# Patient Record
Sex: Female | Born: 1970 | Race: Asian | Hispanic: No | Smoking: Current some day smoker
Health system: Southern US, Community
[De-identification: ages and names within clinical notes are randomized; demographics above are authoritative.]

## PROBLEM LIST (undated history)

## (undated) DIAGNOSIS — K859 Acute pancreatitis without necrosis or infection, unspecified: Secondary | ICD-10-CM

## (undated) DIAGNOSIS — M1990 Unspecified osteoarthritis, unspecified site: Secondary | ICD-10-CM

## (undated) DIAGNOSIS — T4145XA Adverse effect of unspecified anesthetic, initial encounter: Secondary | ICD-10-CM

## (undated) DIAGNOSIS — Z8489 Family history of other specified conditions: Secondary | ICD-10-CM

## (undated) DIAGNOSIS — E119 Type 2 diabetes mellitus without complications: Secondary | ICD-10-CM

## (undated) DIAGNOSIS — IMO0002 Reserved for concepts with insufficient information to code with codable children: Secondary | ICD-10-CM

## (undated) DIAGNOSIS — T8859XA Other complications of anesthesia, initial encounter: Secondary | ICD-10-CM

## (undated) DIAGNOSIS — I10 Essential (primary) hypertension: Secondary | ICD-10-CM

## (undated) DIAGNOSIS — K635 Polyp of colon: Secondary | ICD-10-CM

## (undated) DIAGNOSIS — K219 Gastro-esophageal reflux disease without esophagitis: Secondary | ICD-10-CM

## (undated) DIAGNOSIS — Z96649 Presence of unspecified artificial hip joint: Secondary | ICD-10-CM

## (undated) HISTORY — PX: OTHER SURGICAL HISTORY: SHX169

## (undated) HISTORY — PX: JOINT REPLACEMENT: SHX530

## (undated) HISTORY — PX: TOTAL HIP ARTHROPLASTY: SHX124

---

## 2008-12-24 ENCOUNTER — Emergency Department (HOSPITAL_BASED_OUTPATIENT_CLINIC_OR_DEPARTMENT_OTHER): Admission: EM | Admit: 2008-12-24 | Discharge: 2008-12-24 | Payer: Self-pay | Admitting: Emergency Medicine

## 2011-03-11 ENCOUNTER — Emergency Department (INDEPENDENT_AMBULATORY_CARE_PROVIDER_SITE_OTHER): Payer: 59

## 2011-03-11 ENCOUNTER — Emergency Department (HOSPITAL_BASED_OUTPATIENT_CLINIC_OR_DEPARTMENT_OTHER)
Admission: EM | Admit: 2011-03-11 | Discharge: 2011-03-11 | Disposition: A | Discharge status: Home / Self Care | Payer: 59 | Attending: Emergency Medicine | Admitting: Emergency Medicine

## 2011-03-11 DIAGNOSIS — R509 Fever, unspecified: Secondary | ICD-10-CM

## 2011-03-11 DIAGNOSIS — R059 Cough, unspecified: Secondary | ICD-10-CM | POA: Insufficient documentation

## 2011-03-11 DIAGNOSIS — R51 Headache: Secondary | ICD-10-CM

## 2011-03-11 DIAGNOSIS — R05 Cough: Secondary | ICD-10-CM | POA: Insufficient documentation

## 2011-03-11 DIAGNOSIS — R112 Nausea with vomiting, unspecified: Secondary | ICD-10-CM

## 2011-03-11 DIAGNOSIS — H9209 Otalgia, unspecified ear: Secondary | ICD-10-CM | POA: Insufficient documentation

## 2011-03-11 LAB — DIFFERENTIAL
Basophils Absolute: 0 10*3/uL (ref 0.0–0.1)
Basophils Relative: 0 % (ref 0–1)
Eosinophils Relative: 0 % (ref 0–5)
Lymphocytes Relative: 9 % — ABNORMAL LOW (ref 12–46)
Monocytes Absolute: 0.5 10*3/uL (ref 0.1–1.0)
Neutro Abs: 9 10*3/uL — ABNORMAL HIGH (ref 1.7–7.7)

## 2011-03-11 LAB — COMPREHENSIVE METABOLIC PANEL
Albumin: 3.9 g/dL (ref 3.5–5.2)
BUN: 11 mg/dL (ref 6–23)
GFR calc Af Amer: >60 mL/min (ref >60)
GFR calc non Af Amer: >60 mL/min (ref >60)
Potassium: 4.3 mEq/L (ref 3.5–5.1)
Sodium: 131 mEq/L — ABNORMAL LOW (ref 135–145)
Total Bilirubin: 0.2 mg/dL — ABNORMAL LOW (ref 0.3–1.2)
Total Protein: 8.1 g/dL (ref 6.0–8.3)

## 2011-03-11 LAB — URINALYSIS, MICROSCOPIC ONLY
Glucose, UA: NEGATIVE mg/dL
pH: 6 (ref 5.0–8.0)

## 2011-03-11 LAB — CBC
MCHC: 33.8 g/dL (ref 30.0–36.0)
RDW: 14.9 % (ref 11.5–15.5)

## 2011-03-11 LAB — APTT: aPTT: 33 seconds (ref 24–37)

## 2011-03-11 MED ORDER — MORPHINE SULFATE 4 MG/ML IJ SOLN: 4.0000 mg | Freq: Once | INTRAMUSCULAR | Status: DC

## 2011-03-11 MED ORDER — FENTANYL CITRATE 0.05 MG/ML IJ SOLN
50.0000 ug | Freq: Once | INTRAMUSCULAR | Status: AC
  Administered 2011-03-11: 50 ug via INTRAVENOUS
  Filled 2011-03-11: qty 2

## 2011-03-11 MED ORDER — ONDANSETRON HCL 4 MG/2ML IJ SOLN
4.0000 mg | Freq: Once | INTRAMUSCULAR | Status: AC
  Administered 2011-03-11: 4 mg via INTRAVENOUS

## 2011-03-11 MED ORDER — OXYCODONE-ACETAMINOPHEN 5-325 MG PO TABS: 2.0000 | ORAL_TABLET | Freq: Once | ORAL | Status: DC

## 2011-03-11 MED ORDER — ONDANSETRON HCL 4 MG PO TABS: 4.0000 mg | ORAL_TABLET | Freq: Four times a day (QID) | ORAL | Status: AC

## 2011-03-11 MED ORDER — SULFAMETHOXAZOLE-TMP DS 800-160 MG PO TABS: 2.0000 | ORAL_TABLET | Freq: Once | ORAL | Status: DC

## 2011-03-11 MED ORDER — DOXYCYCLINE HYCLATE 100 MG PO CAPS: 100.0000 mg | ORAL_CAPSULE | Freq: Two times a day (BID) | ORAL | Status: AC

## 2011-03-11 MED ORDER — OXYCODONE-ACETAMINOPHEN 5-325 MG PO TABS: 2.0000 | ORAL_TABLET | ORAL | Status: AC | PRN

## 2011-03-11 MED ORDER — KETOROLAC TROMETHAMINE 30 MG/ML IJ SOLN
30.0000 mg | Freq: Once | INTRAMUSCULAR | Status: AC
  Administered 2011-03-11: 30 mg via INTRAVENOUS
  Filled 2011-03-11: qty 1

## 2011-03-11 MED ORDER — ACETAMINOPHEN 500 MG PO TABS
ORAL_TABLET | ORAL | Status: AC
  Administered 2011-03-11: 13:00:00
  Filled 2011-03-11: qty 1

## 2011-03-11 MED ORDER — MORPHINE SULFATE 4 MG/ML IJ SOLN
4.0000 mg | Freq: Once | INTRAMUSCULAR | Status: AC
  Administered 2011-03-11: 4 mg via INTRAVENOUS
  Filled 2011-03-11: qty 1

## 2011-03-11 MED ORDER — OXYCODONE HCL 5 MG PO TABS
10.0000 mg | ORAL_TABLET | Freq: Once | ORAL | Status: DC
  Filled 2011-03-11: qty 2

## 2011-03-11 MED FILL — Acetaminophen Tab 500 MG: ORAL | Qty: 1 | Status: AC

## 2011-03-12 LAB — URINE CULTURE

## 2011-03-12 MED FILL — Ondansetron HCl Inj 4 MG/2ML (2 MG/ML): INTRAMUSCULAR | Qty: 1 | Status: AC

## 2011-03-12 MED FILL — Doxycycline Hyclate For Inj 100 MG: INTRAVENOUS | Qty: 100 | Status: AC

## 2011-03-17 LAB — CULTURE, BLOOD (ROUTINE X 2)
Culture  Setup Time: 201209182149
Culture: NO GROWTH

## 2011-04-03 NOTE — ED Provider Notes (Signed)
History     CSN: [id number] Arrival date & time: 03/11/2011 11:34 AM  No chief complaint on file.   (Consider location/radiation/quality/duration/timing/severity/associated sxs/prior treatment) The history is provided by the patient and the spouse. No language interpreter was used.   Patient is here today complaining of fever, nausea, and a skin lesion on her scalp. Patient was seen by a PA at her primary care physician's office 3 days ago. At that time she was started on Bactrim for lesion on her scalp. Patient has never had cellulitis or an abscess in the past. Patient says she's continued to get sicker that she's been taking antibiotics. Patient on presentation is febrile to 102. She complains of headache as well. She does not have any neck stiffness. Patient does not have tenderness to palpation of the mastoid process. They affected area is located just superior posterior to the right ear. It is not appear to be any fluctuance or induration associated with this. Patient says her pain and discomfort as a 10 out of 10. When asked where this is located patient states pretty much everywhere. There are no other associated or modifying factors. No past medical history on file.  No past surgical history on file.  No family history on file.  History  Substance Use Topics  . Smoking status: Not on file  . Smokeless tobacco: Not on file  . Alcohol Use: Not on file    OB History    No data available      Review of Systems  Constitutional: Positive for fever and fatigue.  HENT:       Scalp lesion as mentioned previously  Eyes: Negative.   Respiratory: Negative.   Cardiovascular: Negative.   Gastrointestinal: Positive for nausea and vomiting.  Genitourinary: Negative.   Musculoskeletal: Negative.   Skin:       Area of erythema swelling and tenderness over the right parietal area  Neurological: Positive for headaches.  Hematological: Negative.   Psychiatric/Behavioral: Negative.     All other systems reviewed and are negative.    Allergies  Review of patient's allergies indicates no known allergies.  Home Medications   Current Outpatient Rx  Name Route Sig Dispense Refill  . IBUPROFEN 100 MG PO CHEW Oral Chew 100 mg by mouth every 8 (eight) hours as needed.      Marland Kitchen TRAMADOL HCL 50 MG PO TABS Oral Take 50 mg by mouth every 6 (six) hours as needed.        BP 136/80  Pulse 79  Temp(Src) 98.1 F (36.7 C) (Oral)  Resp 18  SpO2 100%  Physical Exam  Nursing note and vitals reviewed. Constitutional: She is oriented to person, place, and time. She appears well-developed and well-nourished. No distress.       Uncomfortable appearing  HENT:  Head: Normocephalic and atraumatic.  Right Ear: External ear normal.  Left Ear: External ear normal.       No tenderness to palpation of the mastoid area. Patient does have tenderness to palpation just superior and posterior to the right ear. There is an area that is approximately 2 inches square or patient has tenderness to palpation but no fluctuance or induration noted.  Eyes: Pupils are equal, round, and reactive to light. Right eye exhibits no discharge. Left eye exhibits no discharge.  Neck: Normal range of motion.  Cardiovascular: Normal rate, regular rhythm, normal heart sounds and intact distal pulses.  Exam reveals no gallop and no friction rub.   No murmur heard.  Pulmonary/Chest: Effort normal and breath sounds normal. No respiratory distress. She has no wheezes. She has no rales.  Abdominal: Soft. Bowel sounds are normal. She exhibits no distension. There is no tenderness. There is no rebound and no guarding.  Musculoskeletal: Normal range of motion. She exhibits no edema and no tenderness.  Neurological: She is alert and oriented to person, place, and time. No cranial nerve deficit. She exhibits normal muscle tone. Coordination normal.  Skin: Skin is warm and dry.  Psychiatric: She has a normal mood and affect.     ED Course  Procedures (including critical care time)  Labs Reviewed  URINALYSIS, MICROSCOPIC ONLY - Abnormal; Notable for the following:    Hgb urine dipstick MODERATE (*)    Ketones, ur 15 (*)    Leukocytes, UA TRACE (*)    Bacteria, UA FEW (*)    Squamous Epithelial / LPF FEW (*)    All other components within normal limits  COMPREHENSIVE METABOLIC PANEL - Abnormal; Notable for the following:    Sodium 131 (*)    Total Bilirubin 0.2 (*)    All other components within normal limits  DIFFERENTIAL - Abnormal; Notable for the following:    Neutrophils Relative 87 (*)    Neutro Abs 9.0 (*)    Lymphocytes Relative 9 (*)    All other components within normal limits  PREGNANCY, URINE  CBC  APTT  URINE CULTURE  CULTURE, BLOOD (ROUTINE X 2)   No results found.   1. Headache       MDM  Patient was admitted and examined by myself. She did have a fever upon initial presentation. During a period of high volume in the ED I was told that the patient had a headache fever and neck stiffness. Patient had labs ordered including CBC, complete metabolic panel, renal panel, urinalysis, and urine pregnancy, CT of the head and chest x-ray. Chest x-ray and head CT were unremarkable. Labs showed no significant leukocytosis or urinary tract infection. Patient was treated for her pain and fever with Tylenol fluids and narcotic pain medication. She also received medication for nausea. Patient was feeling much better. During her interview patient notified me about the lesion on her head and has she been treated with antibiotics for this. Patient only been taking 1 tab of Bactrim twice a day. Given patient's fever I felt that this is likely incompletely treated. Patient was given an additional tab of Bactrim as well as started on Keflex. Patient felt much better. I spoke with the patient's primary care physician. She agreed with plan for discharge with Bactrim and Keflex as discharge medications. Patient  also be given some pain and nausea medication. She will followup tomorrow at her primary care physician's office. Patient should call the office to schedule that her doctors expecting the call. Patient was able to be discharged home in good condition.        Cyndra Numbers, MD 04/03/11 [phone]

## 2011-12-01 ENCOUNTER — Encounter (HOSPITAL_BASED_OUTPATIENT_CLINIC_OR_DEPARTMENT_OTHER): Payer: Self-pay | Admitting: *Deleted

## 2011-12-01 ENCOUNTER — Emergency Department (HOSPITAL_BASED_OUTPATIENT_CLINIC_OR_DEPARTMENT_OTHER)
Admission: EM | Admit: 2011-12-01 | Discharge: 2011-12-01 | Disposition: A | Discharge status: Home / Self Care | Payer: 59 | Attending: Emergency Medicine | Admitting: Emergency Medicine

## 2011-12-01 ENCOUNTER — Emergency Department (HOSPITAL_BASED_OUTPATIENT_CLINIC_OR_DEPARTMENT_OTHER): Payer: 59

## 2011-12-01 DIAGNOSIS — K219 Gastro-esophageal reflux disease without esophagitis: Secondary | ICD-10-CM | POA: Insufficient documentation

## 2011-12-01 DIAGNOSIS — F17200 Nicotine dependence, unspecified, uncomplicated: Secondary | ICD-10-CM | POA: Insufficient documentation

## 2011-12-01 DIAGNOSIS — IMO0001 Reserved for inherently not codable concepts without codable children: Secondary | ICD-10-CM | POA: Insufficient documentation

## 2011-12-01 DIAGNOSIS — R0602 Shortness of breath: Secondary | ICD-10-CM | POA: Insufficient documentation

## 2011-12-01 DIAGNOSIS — R000 Tachycardia, unspecified: Secondary | ICD-10-CM | POA: Insufficient documentation

## 2011-12-01 DIAGNOSIS — R509 Fever, unspecified: Secondary | ICD-10-CM

## 2011-12-01 HISTORY — DX: Gastro-esophageal reflux disease without esophagitis: K21.9

## 2011-12-01 LAB — PREGNANCY, URINE: Preg Test, Ur: NEGATIVE

## 2011-12-01 LAB — URINALYSIS, ROUTINE W REFLEX MICROSCOPIC
Bilirubin Urine: NEGATIVE
Glucose, UA: NEGATIVE mg/dL
Hgb urine dipstick: NEGATIVE
Ketones, ur: NEGATIVE mg/dL
Leukocytes, UA: NEGATIVE
Nitrite: NEGATIVE
Protein, ur: NEGATIVE mg/dL
Specific Gravity, Urine: 1.014 (ref 1.005–1.030)
Urobilinogen, UA: 0.2 mg/dL (ref 0.0–1.0)
pH: 6.5 (ref 5.0–8.0)

## 2011-12-01 MED ORDER — SODIUM CHLORIDE 0.9 % IV BOLUS (SEPSIS)
1000.0000 mL | Freq: Once | INTRAVENOUS | Status: AC
  Administered 2011-12-01: 1000 mL via INTRAVENOUS

## 2011-12-01 MED ORDER — IBUPROFEN 800 MG PO TABS
800.0000 mg | ORAL_TABLET | Freq: Once | ORAL | Status: AC
Start: 2011-12-01 — End: 2011-12-01
  Administered 2011-12-01: 800 mg via ORAL
  Filled 2011-12-01: qty 1

## 2011-12-01 MED ORDER — ACETAMINOPHEN 325 MG PO TABS
650.0000 mg | ORAL_TABLET | Freq: Once | ORAL | Status: AC
  Administered 2011-12-01: 650 mg via ORAL
  Filled 2011-12-01: qty 2

## 2011-12-01 NOTE — Discharge Instructions (Signed)
Please read and follow all provided instructions.  Your diagnoses today include:  1. Fever     Tests performed today include:  Chest x-ray - does not show pneumonia  Urine test - does not show infection  Vital signs. See below for your results today.   Medications prescribed:   None  Home care instructions:  Follow any educational materials contained in this packet.  Follow-up instructions: Please follow-up with your primary care provider in the next 3 days for further evaluation of your symptoms. If you do not have a primary care doctor -- see below for referral information.   Return instructions:   Please return to the Emergency Department if you experience worsening symptoms.   Return with persistent fever, persistent vomiting, trouble breathing, or if you feel worse.  Please return if you have any other emergent concerns.  Additional Information:  Your vital signs today were: BP 96/48  Pulse 107  Temp(Src) 99 F (37.2 C) (Oral)  Resp 20  Wt 175 lb (79.379 kg)  SpO2 96%  LMP 11/17/2011 If your blood pressure (BP) was elevated above 135/85 this visit, please have this repeated by your doctor within one month. -------------- No Primary Care Doctor Call [facility]  [phone] Other agencies that provide inexpensive medical care    [facility]  [phone]    [facility]  [phone]    [facility]  [phone]    [facility]  [phone]    [facility]  [phone]    [facility]  [phone] -------------- RESOURCE GUIDE:  Dental Problems  Patients with Medicaid: [facility] [phone] [address]                                            [phone] [address] Phone:  [phone]                                                   Phone:  [phone]  If unable to pay or uninsured, contact:  [facility] or [facility]. to become qualified for the  adult dental clinic.  Chronic Pain Problems Contact [facility]  [phone] Patients need to be referred by their primary care doctor.  Insufficient Money for Medicine Contact [employer]:  call "211" or [facility] [phone].  Psychological Services [facility]  [phone] [facility]  [phone] [facility]   [phone] (emergency services [phone])  Substance Abuse Resources [facility]  [phone] [facility] [phone] [facility] [phone] Floydene Flock [phone] Residential & Outpatient Substance Abuse Program  [phone]  Abuse/Neglect [facility] Child Abuse Hotline 808-272-1252 [facility] Child Abuse Hotline (512)[phone] (After Hours)  Emergency Shelter [facility] [phone]  Maternity Homes Room at the [place] [facility] [phone] [place] [facility] [phone]  [facility] [place]  [facility] [address] [place]                       [address]      [address]  [place]                                                [place]                            [place] Phone:  [phone]                                   Phone:  [phone]                 Phone:  [phone]  [facility] Phone:  [phone]  [place] Child Abuse Hotline (336) 342-1394 [phone] (After Hours)    

## 2011-12-01 NOTE — ED Provider Notes (Signed)
Medical screening examination/treatment/procedure(s) were performed by non-physician practitioner and as supervising physician I was immediately available for consultation/collaboration.   Gavin Pound. Niccolo Burggraf, MD 12/01/11 2349

## 2011-12-01 NOTE — ED Provider Notes (Signed)
History     CSN: [id number]  Arrival date & time 12/01/11  1647   First MD Initiated Contact with Patient 12/01/11 1710      Chief Complaint  Patient presents with  . Fever    (Consider location/radiation/quality/duration/timing/severity/associated sxs/prior treatment) HPI Comments: Patient presents with complaint of fever, onset this morning, associated with bodyaches. Patient has taken ibuprofen without relief. Patient denies upper respiratory tract infection symptoms including runny nose, sinus pressure, sore throat. She has not had a cough but she states that she felt short of breath earlier. Patient denies abdominal pain. She denies nausea vomiting or diarrhea. She denies urinary symptoms. Patient denies rashes or skin changes. No sick contacts. Onset was acute. Course is constant.  Patient is a 41 y.o. female presenting with fever. The history is provided by the patient.  Fever Primary symptoms of the febrile illness include fever, fatigue, shortness of breath and myalgias. Primary symptoms do not include headaches, cough, wheezing, abdominal pain, nausea, vomiting, diarrhea, dysuria or rash. The current episode started today. This is a new problem. The problem has not changed since onset.   Past Medical History  Diagnosis Date  . GERD (gastroesophageal reflux disease)     Past Surgical History  Procedure Date  . Total hip arthroplasty     bilat    No family history on file.  History  Substance Use Topics  . Smoking status: Current Everyday Smoker -- 0.5 packs/day  . Smokeless tobacco: Not on file  . Alcohol Use: Yes    OB History    Grav Para Term Preterm Abortions TAB SAB Ect Mult Living                  Review of Systems  Constitutional: Positive for fever and fatigue.  HENT: Negative for congestion, sore throat, rhinorrhea, neck pain, neck stiffness and sinus pressure.   Eyes: Negative for redness.  Respiratory: Positive for shortness of breath. Negative  for cough and wheezing.   Cardiovascular: Negative for chest pain.  Gastrointestinal: Negative for nausea, vomiting, abdominal pain and diarrhea.  Genitourinary: Negative for dysuria.  Musculoskeletal: Positive for myalgias.  Skin: Negative for rash.  Neurological: Negative for headaches.    Allergies  Review of patient's allergies indicates no known allergies.  Home Medications   Current Outpatient Rx  Name Route Sig Dispense Refill  . AMOXICILLIN 500 MG PO CAPS Oral Take 2,000 mg by mouth once as needed. 1 hour before dental appointment or lab work    . IBUPROFEN 200 MG PO TABS Oral Take 400 mg by mouth once as needed. For fever    . ADULT MULTIVITAMIN W/MINERALS CH Oral Take 1 tablet by mouth daily.    Marland Kitchen FISH OIL PO Oral Take 1 capsule by mouth daily.    Marland Kitchen OMEPRAZOLE 40 MG PO CPDR Oral Take 40 mg by mouth daily.    Marland Kitchen VITAMIN D (CHOLECALCIFEROL) PO Oral Take 1 tablet by mouth daily.      BP 97/66  Pulse 106  Temp(Src) 101 F (38.3 C) (Oral)  Resp 20  Wt 175 lb (79.379 kg)  SpO2 99%  LMP 11/17/2011  Physical Exam  Nursing note and vitals reviewed. Constitutional: She appears well-developed and well-nourished.  HENT:  Head: Normocephalic and atraumatic.  Right Ear: Tympanic membrane, external ear and ear canal normal.  Left Ear: Tympanic membrane, external ear and ear canal normal.  Nose: Nose normal. No rhinorrhea.  Mouth/Throat: Uvula is midline, oropharynx is clear and moist  and mucous membranes are normal. Mucous membranes are not dry.  Eyes: Conjunctivae are normal. Right eye exhibits no discharge. Left eye exhibits no discharge.  Neck: Normal range of motion. Neck supple.       No meningeal signs  Cardiovascular: Regular rhythm and normal heart sounds.   No murmur heard.      tachycardia  Pulmonary/Chest: Effort normal and breath sounds normal. No respiratory distress. She has no wheezes.  Abdominal: Soft. There is no tenderness. There is no rebound and no  guarding.  Musculoskeletal: She exhibits no edema and no tenderness.  Neurological: She is alert.  Skin: Skin is warm and dry.  Psychiatric: She has a normal mood and affect.    ED Course  Procedures (including critical care time)   Labs Reviewed  URINALYSIS, ROUTINE W REFLEX MICROSCOPIC  PREGNANCY, URINE   Dg Chest 2 View  12/01/2011  *RADIOLOGY REPORT*  Clinical Data: Fever and bodyaches.  CHEST - 2 VIEW  Comparison: 03/11/2011  Findings: Lateral view degraded by patient arm position.  Midline trachea.  Normal heart size and mediastinal contours. No pleural effusion or pneumothorax.  Clear lungs.  Diffuse peribronchial thickening.  IMPRESSION: 1.  No acute cardiopulmonary disease. 2.  Mild peribronchial thickening which may relate to chronic bronchitis or smoking.  Original Report Authenticated By: Consuello Bossier, M.D.     1. Fever     6:37 PM Patient seen and examined. Work-up initiated. Medications ordered.   Vital signs reviewed and are as follows: Filed Vitals:   12/01/11 1740  BP:   Pulse: 106  Temp:   Resp:   BP 97/66  Pulse 106  Temp(Src) 101 F (38.3 C) (Oral)  Resp 20  Wt 175 lb (79.379 kg)  SpO2 99%  LMP 11/17/2011  Fever persists, exam unchanged. Ibuprofen and fluids ordered. Patient informed of x-ray results.   8:21 PM Patient feeling better. IOn re-exam, abdomen remains soft, no URI symptoms, no cough, no resp distress. Family and patient will continue Tylenol and ibuprofen at home for supportive treatment. I urged the patient to return with worsening symptoms, persistent fever, persistent vomiting, or she has any other concerns. Patient is urged followup with her family physician in 3 days for recheck. Patient appears well and is ambulating without difficulty upon leaving the emergency department.  BP 96/48  Pulse 107  Temp(Src) 99 F (37.2 C) (Oral)  Resp 20  Wt 175 lb (79.379 kg)  SpO2 96%  LMP 11/17/2011    MDM  Fever, uncertain etiology at  this point. Patient appears well, non-toxic. CXR neg for acute infection. UA normal. No URI symptoms. No rash. Abdomen is soft. Do not suspect PE. Given myalgias, possible viral syndrome? Fever improved with treatment. Strict return instructions given.         Renne Crigler, [place] 12/01/11 2245

## 2011-12-01 NOTE — ED Notes (Signed)
Teaching done with  Pt. About taking motrin and tylenol in alternating doses every 4-6 hrs.  Pt. Verbalized understanding.

## 2011-12-01 NOTE — ED Notes (Signed)
Fever, chills this am. Aching all over.

## 2011-12-01 NOTE — ED Notes (Signed)
Pt. Now reports nausea after taking motrin on empty stomache

## 2011-12-10 ENCOUNTER — Encounter (HOSPITAL_BASED_OUTPATIENT_CLINIC_OR_DEPARTMENT_OTHER): Payer: Self-pay | Admitting: *Deleted

## 2011-12-11 ENCOUNTER — Encounter (HOSPITAL_BASED_OUTPATIENT_CLINIC_OR_DEPARTMENT_OTHER): Payer: Self-pay | Admitting: *Deleted

## 2011-12-11 NOTE — Progress Notes (Signed)
No labs needed Needs antibiotic prior to any procedure due to total hips

## 2011-12-12 ENCOUNTER — Ambulatory Visit (HOSPITAL_BASED_OUTPATIENT_CLINIC_OR_DEPARTMENT_OTHER)
Admission: RE | Admit: 2011-12-12 | Discharge: 2011-12-12 | Disposition: A | Discharge status: Home / Self Care | Payer: 59 | Source: Ambulatory Visit | Attending: Ophthalmology | Admitting: Ophthalmology

## 2011-12-12 ENCOUNTER — Encounter (HOSPITAL_BASED_OUTPATIENT_CLINIC_OR_DEPARTMENT_OTHER)
Admission: RE | Disposition: A | Discharge status: Home / Self Care | Payer: Self-pay | Source: Ambulatory Visit | Attending: Ophthalmology

## 2011-12-12 ENCOUNTER — Encounter (HOSPITAL_BASED_OUTPATIENT_CLINIC_OR_DEPARTMENT_OTHER): Payer: Self-pay | Admitting: Anesthesiology

## 2011-12-12 ENCOUNTER — Encounter (HOSPITAL_BASED_OUTPATIENT_CLINIC_OR_DEPARTMENT_OTHER): Payer: Self-pay | Admitting: *Deleted

## 2011-12-12 ENCOUNTER — Ambulatory Visit (HOSPITAL_BASED_OUTPATIENT_CLINIC_OR_DEPARTMENT_OTHER): Payer: 59 | Admitting: Anesthesiology

## 2011-12-12 DIAGNOSIS — H5010 Unspecified exotropia: Secondary | ICD-10-CM | POA: Insufficient documentation

## 2011-12-12 DIAGNOSIS — K219 Gastro-esophageal reflux disease without esophagitis: Secondary | ICD-10-CM | POA: Insufficient documentation

## 2011-12-12 HISTORY — DX: Presence of unspecified artificial hip joint: Z96.649

## 2011-12-12 HISTORY — PX: STRABISMUS SURGERY: SHX218

## 2011-12-12 SURGERY — REPAIR STRABISMUS
Anesthesia: General | Site: Eye | Laterality: Right | Wound class: Clean

## 2011-12-12 MED ORDER — FENTANYL CITRATE 0.05 MG/ML IJ SOLN
INTRAMUSCULAR | Status: DC | PRN
  Administered 2011-12-12 (×3): 50 ug via INTRAVENOUS

## 2011-12-12 MED ORDER — MORPHINE SULFATE 2 MG/ML IJ SOLN
1.0000 mg | INTRAMUSCULAR | Status: DC | PRN
  Administered 2011-12-12 (×5): 1 mg via INTRAVENOUS

## 2011-12-12 MED ORDER — LACTATED RINGERS IV SOLN: INTRAVENOUS | Status: DC

## 2011-12-12 MED ORDER — TOBRAMYCIN-DEXAMETHASONE 0.3-0.1 % OP OINT
TOPICAL_OINTMENT | OPHTHALMIC | Status: DC | PRN
  Administered 2011-12-12: 1 via OPHTHALMIC

## 2011-12-12 MED ORDER — TOBRAMYCIN-DEXAMETHASONE 0.3-0.1 % OP OINT: TOPICAL_OINTMENT | Freq: Two times a day (BID) | OPHTHALMIC | Status: AC

## 2011-12-12 MED ORDER — OXYCODONE-ACETAMINOPHEN 5-325 MG PO TABS
1.0000 | ORAL_TABLET | Freq: Once | ORAL | Status: AC
Start: 2011-12-12 — End: 2011-12-12
  Administered 2011-12-12: 1 via ORAL

## 2011-12-12 MED ORDER — CEFAZOLIN SODIUM 1-5 GM-% IV SOLN
1.0000 g | Freq: Three times a day (TID) | INTRAVENOUS | Status: DC
  Administered 2011-12-12: 1 g via INTRAVENOUS

## 2011-12-12 MED ORDER — ONDANSETRON HCL 4 MG/2ML IJ SOLN
INTRAMUSCULAR | Status: DC | PRN
  Administered 2011-12-12: 4 mg via INTRAVENOUS

## 2011-12-12 MED ORDER — MIDAZOLAM HCL 5 MG/5ML IJ SOLN
INTRAMUSCULAR | Status: DC | PRN
  Administered 2011-12-12: 2 mg via INTRAVENOUS

## 2011-12-12 MED ORDER — OXYCODONE-ACETAMINOPHEN 7.5-325 MG PO TABS: 1.0000 | ORAL_TABLET | ORAL | Status: AC | PRN

## 2011-12-12 MED ORDER — LACTATED RINGERS IV SOLN
INTRAVENOUS | Status: DC | PRN
  Administered 2011-12-12 (×2): via INTRAVENOUS

## 2011-12-12 MED ORDER — DEXAMETHASONE SODIUM PHOSPHATE 4 MG/ML IJ SOLN
INTRAMUSCULAR | Status: DC | PRN
  Administered 2011-12-12: 10 mg via INTRAVENOUS

## 2011-12-12 MED ORDER — KETOROLAC TROMETHAMINE 30 MG/ML IJ SOLN
INTRAMUSCULAR | Status: DC | PRN
  Administered 2011-12-12: 30 mg via INTRAVENOUS

## 2011-12-12 MED ORDER — PHENYLEPHRINE HCL 2.5 % OP SOLN
OPHTHALMIC | Status: DC | PRN
  Administered 2011-12-12: 2 [drp] via OPHTHALMIC

## 2011-12-12 MED ORDER — CEFAZOLIN SODIUM 1 G IJ SOLR: 1.0000 g | Freq: Once | INTRAMUSCULAR | Status: DC

## 2011-12-12 MED ORDER — PROPOFOL 10 MG/ML IV EMUL
INTRAVENOUS | Status: DC | PRN
  Administered 2011-12-12: 50 mg via INTRAVENOUS
  Administered 2011-12-12: 150 mg via INTRAVENOUS

## 2011-12-12 MED ORDER — LIDOCAINE HCL (CARDIAC) 20 MG/ML IV SOLN
INTRAVENOUS | Status: DC | PRN
  Administered 2011-12-12: 40 mg via INTRAVENOUS

## 2011-12-12 SURGICAL SUPPLY — 29 items
APPLICATOR COTTON TIP 6IN STRL (MISCELLANEOUS) ×8 IMPLANT
APPLICATOR DR MATTHEWS STRL (MISCELLANEOUS) ×2 IMPLANT
CAUTERY EYE LOW TEMP 1300F FIN (OPHTHALMIC RELATED) IMPLANT
CLOTH BEACON ORANGE TIMEOUT ST (SAFETY) ×2 IMPLANT
COVER MAYO STAND STRL (DRAPES) ×2 IMPLANT
COVER TABLE BACK 60X90 (DRAPES) ×2 IMPLANT
DRAPE SURG 17X23 STRL (DRAPES) ×4 IMPLANT
DRAPE U-SHAPE 76X120 STRL (DRAPES) ×2 IMPLANT
GLOVE BIO SURGEON STRL SZ 6.5 (GLOVE) ×2 IMPLANT
GLOVE BIOGEL M STRL SZ7.5 (GLOVE) ×4 IMPLANT
GOWN BRE IMP PREV XXLGXLNG (GOWN DISPOSABLE) ×4 IMPLANT
GOWN PREVENTION PLUS XLARGE (GOWN DISPOSABLE) ×2 IMPLANT
NS IRRIG 1000ML POUR BTL (IV SOLUTION) ×2 IMPLANT
PACK BASIN DAY SURGERY FS (CUSTOM PROCEDURE TRAY) ×2 IMPLANT
PAD EYE OVAL STERILE LF (GAUZE/BANDAGES/DRESSINGS) ×4 IMPLANT
SHEET MEDIUM DRAPE 40X70 STRL (DRAPES) IMPLANT
SPEAR EYE SURG WECK-CEL (MISCELLANEOUS) ×4 IMPLANT
STRIP CLOSURE SKIN 1/4X4 (GAUZE/BANDAGES/DRESSINGS) IMPLANT
SUT 6 0 SILK T G140 8DA (SUTURE) IMPLANT
SUT MERSILENE 6 0 S14 DA (SUTURE) IMPLANT
SUT PLAIN 6 0 TG1408 (SUTURE) ×2 IMPLANT
SUT SILK 4 0 C 3 735G (SUTURE) IMPLANT
SUT VICRYL 6 0 S 28 (SUTURE) IMPLANT
SUT VICRYL ABS 6-0 S29 18IN (SUTURE) ×2 IMPLANT
SYRINGE 10CC LL (SYRINGE) ×2 IMPLANT
TOWEL OR 17X24 6PK STRL BLUE (TOWEL DISPOSABLE) ×2 IMPLANT
TOWEL OR NON WOVEN STRL DISP B (DISPOSABLE) ×2 IMPLANT
TRAY DSU PREP LF (CUSTOM PROCEDURE TRAY) ×2 IMPLANT
WATER STERILE IRR 1000ML POUR (IV SOLUTION) ×2 IMPLANT

## 2011-12-12 NOTE — Discharge Instructions (Addendum)
Diet: Clear liquids, advance to soft foods then regular diet as tolerated.  Pain control:   1)  Ibuprofen 600 mg by mouth every 6-8 hours as needed for pain  2)  Percocet 7.5/325 one or two by mouth as needed every 4-6 hours as needed for pain that is not resolved by ibuprofen  Eye medications:  Tobradex eye ointment, 1/2 inch in the operated eye(s) 2 times a day for 10 days.  Activity: No swimming for 1 week.  It is OK to let water run over the face and eyes while showering or taking a bath, even during the first week.  No other restriction on exercise or activity.  Call Dr. Roxy Cedar office [phone] with any problems or concerns.    Post Anesthesia Home Care Instructions  Activity: Get plenty of rest for the remainder of the day. A responsible adult should stay with you for 24 hours following the procedure.  For the next 24 hours, DO NOT: -Drive a car -Advertising copywriter -Drink alcoholic beverages -Take any medication unless instructed by your physician -Make any legal decisions or sign important papers.  Meals: Start with liquid foods such as gelatin or soup. Progress to regular foods as tolerated. Avoid greasy, spicy, heavy foods. If nausea and/or vomiting occur, drink only clear liquids until the nausea and/or vomiting subsides. Call your physician if vomiting continues.  Special Instructions/Symptoms: Your throat may feel dry or sore from the anesthesia or the breathing tube placed in your throat during surgery. If this causes discomfort, gargle with warm salt water. The discomfort should disappear within 24 hours.

## 2011-12-12 NOTE — Op Note (Signed)
12/12/2011  11:08 AM  PATIENT:  April Chaney  41 y.o. female  PRE-OPERATIVE DIAGNOSIS:  Exotropia, horizontally incomitant, s/p LLR recess  POST-OPERATIVE DIAGNOSIS:  same  PROCEDURE:  Lateral rectus muscle recession 8.5 mm right eye  SURGEON:  Pasty Spillers.Maple Hudson, M.D.   ANESTHESIA:   general  COMPLICATIONS:None  DESCRIPTION OF PROCEDURE: The patient was taken to the operating room where She was identified by me. General anesthesia was induced without difficulty after placement of appropriate monitors. The patient was prepped and draped in standard sterile fashion. A lid speculum was placed in the right eye.  Through an inferotemporal fornix incision through conjunctiva and Tenon's fascia, the right lateral rectus muscle was engaged on a series of muscle hooks and cleared of its fascial attachments. The tendon was secured with a double-armed 6-0 Vicryl suture with a double locking bite at each border of the muscle, 1 mm from the insertion. The muscle was disinserted, and was reattached to sclera at a measured distance of 8.5 millimeters posterior to the original insertion, using direct scleral passes in crossed swords fashion.  The suture ends were tied securely after the position of the muscle had been checked and found to be accurate. Conjunctiva was closed with 2 6-0 Vicryl sutures.   TobraDex ointment was placed in the right  eye. The patient was awakened without difficulty and taken to the recovery room in stable condition, having suffered no intraoperative or immediate postoperative complications.  Pasty Spillers. Hannahmarie Asberry M.D.    PATIENT DISPOSITION:  PACU - hemodynamically stable.

## 2011-12-12 NOTE — Anesthesia Preprocedure Evaluation (Signed)
Anesthesia Evaluation  Patient identified by MRN, date of birth, ID band Patient awake    Reviewed: Allergy & Precautions, H&P , NPO status , Patient's Chart, lab work & pertinent test results  Airway Mallampati: II TM Distance: >3 FB Neck ROM: Full    Dental No notable dental hx. (+) Teeth Intact and Dental Advisory Given   Pulmonary neg pulmonary ROS,  breath sounds clear to auscultation  Pulmonary exam normal       Cardiovascular negative cardio ROS  Rhythm:Regular Rate:Normal     Neuro/Psych negative neurological ROS  negative psych ROS   GI/Hepatic Neg liver ROS, GERD-  Medicated and Controlled,  Endo/Other  negative endocrine ROS  Renal/GU negative Renal ROS  negative genitourinary   Musculoskeletal   Abdominal   Peds  Hematology negative hematology ROS (+)   Anesthesia Other Findings   Reproductive/Obstetrics negative OB ROS                           Anesthesia Physical Anesthesia Plan  ASA: II  Anesthesia Plan: General   Post-op Pain Management:    Induction: Intravenous  Airway Management Planned: LMA  Additional Equipment:   Intra-op Plan:   Post-operative Plan: Extubation in OR  Informed Consent: I have reviewed the patients History and Physical, chart, labs and discussed the procedure including the risks, benefits and alternatives for the proposed anesthesia with the patient or authorized representative who has indicated his/her understanding and acceptance.   Dental advisory given  Plan Discussed with: CRNA and Surgeon  Anesthesia Plan Comments:         Anesthesia Quick Evaluation  

## 2011-12-12 NOTE — Brief Op Note (Signed)
12/12/2011  11:07 AM  PATIENT:  April Chaney  41 y.o. female  PRE-OPERATIVE DIAGNOSIS:  exotropia right eye  POST-OPERATIVE DIAGNOSIS:  * No post-op diagnosis entered *  PROCEDURE:  Procedure(s) (LRB): REPAIR STRABISMUS (Right)  SURGEON:  Surgeon(s) and Role:    * Shara Blazing, MD - Primary  PHYSICIAN ASSISTANT:   ASSISTANTS: none   ANESTHESIA:   general  EBL:     BLOOD ADMINISTERED:none  DRAINS: none   LOCAL MEDICATIONS USED:  NONE  SPECIMEN:  No Specimen  DISPOSITION OF SPECIMEN:  N/A  COUNTS:  YES  TOURNIQUET:  * No tourniquets in log *  DICTATION: .Note written in EPIC  PLAN OF CARE: Discharge to home after PACU  PATIENT DISPOSITION:  PACU - hemodynamically stable.   Delay start of Pharmacological VTE agent (>24hrs) due to surgical blood loss or risk of bleeding: not applicable

## 2011-12-12 NOTE — Anesthesia Postprocedure Evaluation (Signed)
  Anesthesia Post-op Note  Patient: April Chaney  Procedure(s) Performed: Procedure(s) (LRB): REPAIR STRABISMUS (Right)  Patient Location: PACU  Anesthesia Type: General  Level of Consciousness: awake and alert   Airway and Oxygen Therapy: Patient Spontanous Breathing  Post-op Pain: mild  Post-op Assessment: Post-op Vital signs reviewed, Patient's Cardiovascular Status Stable, Respiratory Function Stable, Patent Airway and No signs of Nausea or vomiting  Post-op Vital Signs: Reviewed and stable  Complications: No apparent anesthesia complications

## 2011-12-12 NOTE — Anesthesia Procedure Notes (Signed)
Procedure Name: LMA Insertion Date/Time: 12/12/2011 10:28 AM Performed by: Burna Cash Pre-anesthesia Checklist: Patient identified, Emergency Drugs available, Suction available and Patient being monitored Patient Re-evaluated:Patient Re-evaluated prior to inductionOxygen Delivery Method: Circle System Utilized Preoxygenation: Pre-oxygenation with 100% oxygen Intubation Type: IV induction Ventilation: Mask ventilation without difficulty LMA: LMA flexible inserted LMA Size: 4.0 Number of attempts: 1 Airway Equipment and Method: bite block Placement Confirmation: positive ETCO2 Tube secured with: Tape Dental Injury: Teeth and Oropharynx as per pre-operative assessment

## 2011-12-12 NOTE — H&P (Signed)
  Date of examination:  12-11-11  Indication for surgery: 41 year old woman with intermittent exotropia, history of previous eye muscle surgery on the left eye, admitted for further eye muscle surgery to straighten the eyes and allow some binocularity. Note patient has prism in her current glasses, and this has failed to control her diplopia to  Pertinent past medical history:  Past Medical History  Diagnosis Date  . GERD (gastroesophageal reflux disease)   . 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 Based on examination, the left lateral rectus muscle appears to been recessed. Records of previous surgery not available.  Pertinent family history: No family history on file.  General:  Healthy appearing patient in no distress.    Eyes:    Acuity cc    OD 20/20  OS 20/20  External: Within normal limits     Anterior segment: Within normal limits except  healed conjunctival scar temporally in left eye  Motility:  Cc  X(T) = 17 in primary.  20 in R gaze, 6 in L gaze.  X(T)' = 12  Fundus: deferred  Refraction:    Manifest  OD -2.00 + 1.25 x 101  OS -3.00 +2.00 x 090  Heart: Regular rate and rhythm without murmur     Lungs: Clear to auscultation     Abdomen: Soft, nontender, normal bowel sounds     Impression:X(T), residual vs. Recurrent, horizontally incomitant, s/p probable LLR recess  Plan: RLR recess  Orian Figueira O

## 2011-12-12 NOTE — Transfer of Care (Signed)
Immediate Anesthesia Transfer of Care Note  Patient: April Chaney  Procedure(s) Performed: Procedure(s) (LRB): REPAIR STRABISMUS (Right)  Patient Location: PACU  Anesthesia Type: General  Level of Consciousness: sedated  Airway & Oxygen Therapy: Patient Spontanous Breathing and Patient connected to face mask oxygen  Post-op Assessment: Report given to PACU RN and Post -op Vital signs reviewed and stable  Post vital signs: Reviewed and stable  Complications: No apparent anesthesia complications

## 2011-12-16 ENCOUNTER — Encounter (HOSPITAL_BASED_OUTPATIENT_CLINIC_OR_DEPARTMENT_OTHER): Payer: Self-pay | Admitting: Ophthalmology

## 2013-12-04 ENCOUNTER — Emergency Department (HOSPITAL_BASED_OUTPATIENT_CLINIC_OR_DEPARTMENT_OTHER): Payer: BC Managed Care – PPO

## 2013-12-04 ENCOUNTER — Encounter (HOSPITAL_BASED_OUTPATIENT_CLINIC_OR_DEPARTMENT_OTHER): Payer: Self-pay | Admitting: Emergency Medicine

## 2013-12-04 ENCOUNTER — Emergency Department (HOSPITAL_BASED_OUTPATIENT_CLINIC_OR_DEPARTMENT_OTHER)
Admission: EM | Admit: 2013-12-04 | Discharge: 2013-12-04 | Disposition: A | Discharge status: Home / Self Care | Payer: BC Managed Care – PPO | Attending: Emergency Medicine | Admitting: Emergency Medicine

## 2013-12-04 DIAGNOSIS — K219 Gastro-esophageal reflux disease without esophagitis: Secondary | ICD-10-CM | POA: Insufficient documentation

## 2013-12-04 DIAGNOSIS — Z79899 Other long term (current) drug therapy: Secondary | ICD-10-CM | POA: Insufficient documentation

## 2013-12-04 DIAGNOSIS — Z791 Long term (current) use of non-steroidal anti-inflammatories (NSAID): Secondary | ICD-10-CM | POA: Insufficient documentation

## 2013-12-04 DIAGNOSIS — Z3202 Encounter for pregnancy test, result negative: Secondary | ICD-10-CM | POA: Insufficient documentation

## 2013-12-04 DIAGNOSIS — F17200 Nicotine dependence, unspecified, uncomplicated: Secondary | ICD-10-CM | POA: Insufficient documentation

## 2013-12-04 DIAGNOSIS — K859 Acute pancreatitis without necrosis or infection, unspecified: Secondary | ICD-10-CM | POA: Insufficient documentation

## 2013-12-04 DIAGNOSIS — Z792 Long term (current) use of antibiotics: Secondary | ICD-10-CM | POA: Insufficient documentation

## 2013-12-04 DIAGNOSIS — Z96649 Presence of unspecified artificial hip joint: Secondary | ICD-10-CM | POA: Insufficient documentation

## 2013-12-04 HISTORY — DX: Reserved for concepts with insufficient information to code with codable children: IMO0002

## 2013-12-04 HISTORY — DX: Polyp of colon: K63.5

## 2013-12-04 LAB — COMPREHENSIVE METABOLIC PANEL
ALBUMIN: 3.7 g/dL (ref 3.5–5.2)
ALT: 11 U/L (ref 0–35)
AST: 16 U/L (ref 0–37)
Alkaline Phosphatase: 77 U/L (ref 39–117)
BUN: 10 mg/dL (ref 6–23)
CALCIUM: 9.7 mg/dL (ref 8.4–10.5)
CHLORIDE: 101 meq/L (ref 96–112)
CO2: 21 mEq/L (ref 19–32)
CREATININE: 0.6 mg/dL (ref 0.50–1.10)
GFR calc Af Amer: >90 mL/min (ref >90)
GFR calc non Af Amer: >90 mL/min (ref >90)
Glucose, Bld: 139 mg/dL — ABNORMAL HIGH (ref 70–99)
Potassium: 4 mEq/L (ref 3.7–5.3)
Sodium: 136 mEq/L — ABNORMAL LOW (ref 137–147)
Total Bilirubin: 0.3 mg/dL (ref 0.3–1.2)
Total Protein: 8 g/dL (ref 6.0–8.3)

## 2013-12-04 LAB — CBC WITH DIFFERENTIAL/PLATELET
BASOS ABS: 0 10*3/uL (ref 0.0–0.1)
BASOS PCT: 0 % (ref 0–1)
Eosinophils Absolute: 0.1 10*3/uL (ref 0.0–0.7)
Eosinophils Relative: 1 % (ref 0–5)
HEMATOCRIT: 38.7 % (ref 36.0–46.0)
Hemoglobin: 13 g/dL (ref 12.0–15.0)
Lymphocytes Relative: 17 % (ref 12–46)
Lymphs Abs: 1.9 10*3/uL (ref 0.7–4.0)
MCH: 28.8 pg (ref 26.0–34.0)
MCHC: 33.6 g/dL (ref 30.0–36.0)
MCV: 85.6 fL (ref 78.0–100.0)
MONO ABS: 0.9 10*3/uL (ref 0.1–1.0)
Monocytes Relative: 8 % (ref 3–12)
NEUTROS ABS: 7.9 10*3/uL — AB (ref 1.7–7.7)
Neutrophils Relative %: 74 % (ref 43–77)
PLATELETS: 259 10*3/uL (ref 150–400)
RBC: 4.52 MIL/uL (ref 3.87–5.11)
RDW: 14.6 % (ref 11.5–15.5)
WBC: 10.8 10*3/uL — ABNORMAL HIGH (ref 4.0–10.5)

## 2013-12-04 LAB — URINALYSIS, ROUTINE W REFLEX MICROSCOPIC
Bilirubin Urine: NEGATIVE
GLUCOSE, UA: NEGATIVE mg/dL
Hgb urine dipstick: NEGATIVE
Ketones, ur: NEGATIVE mg/dL
LEUKOCYTES UA: NEGATIVE
Nitrite: NEGATIVE
PH: 5.5 (ref 5.0–8.0)
Protein, ur: NEGATIVE mg/dL
Specific Gravity, Urine: 1.01 (ref 1.005–1.030)
Urobilinogen, UA: 0.2 mg/dL (ref 0.0–1.0)

## 2013-12-04 LAB — LIPASE, BLOOD: Lipase: 1387 U/L — ABNORMAL HIGH (ref 11–59)

## 2013-12-04 LAB — PREGNANCY, URINE: PREG TEST UR: NEGATIVE

## 2013-12-04 MED ORDER — SODIUM CHLORIDE 0.9 % IV BOLUS (SEPSIS)
1000.0000 mL | Freq: Once | INTRAVENOUS | Status: AC
  Administered 2013-12-04: 1000 mL via INTRAVENOUS

## 2013-12-04 MED ORDER — IOHEXOL 300 MG/ML  SOLN
50.0000 mL | Freq: Once | INTRAMUSCULAR | Status: AC | PRN
  Administered 2013-12-04: 50 mL via ORAL

## 2013-12-04 MED ORDER — OXYCODONE-ACETAMINOPHEN 5-325 MG PO TABS
1.0000 | ORAL_TABLET | ORAL | Status: DC | PRN
Start: 2013-12-04 — End: 2016-06-27

## 2013-12-04 MED ORDER — IOHEXOL 300 MG/ML  SOLN
100.0000 mL | Freq: Once | INTRAMUSCULAR | Status: AC | PRN
  Administered 2013-12-04: 100 mL via INTRAVENOUS

## 2013-12-04 MED ORDER — MORPHINE SULFATE 4 MG/ML IJ SOLN
4.0000 mg | Freq: Once | INTRAMUSCULAR | Status: AC
  Administered 2013-12-04: 4 mg via INTRAVENOUS
  Filled 2013-12-04: qty 1

## 2013-12-04 NOTE — ED Notes (Signed)
MD at bedside discussing results. 

## 2013-12-04 NOTE — ED Provider Notes (Signed)
CSN: [id number]     Arrival date & time 12/04/13  1962 History   First MD Initiated Contact with Patient 12/04/13 [phone]     Chief Complaint  Patient presents with  . Constipation     (Consider location/radiation/quality/duration/timing/severity/associated sxs/prior Treatment) HPI 43 year old female presents with a chief complaint of constipation. She relates that 5 days ago she had her last normal bowel movement. Since then she's been having progressively worsening left-sided abdominal pain. She feels like she is bloating. She's tried Gas-X and a laxative. She states she had a couple small bowel movements but otherwise still feels like she is constipated. She vomited once during this process as well as and no vomiting. Earlier she tried enema which she could not tolerate the procedure. The patient feels like there are knots in her stomach a posterior left upper quadrant as the source of most of her pain. She states she's only able to eat a few shrimp last night at dinner. She rates pain as a 7/10. She's not taken anything specifically for the pain. No fevers. No urinary symptoms.  Past Medical History  Diagnosis Date  . GERD (gastroesophageal reflux disease)   . Presence of artificial hip     has to take antibiotics prior to any procedure-dental, etc   Past Surgical History  Procedure Laterality Date  . Total hip arthroplasty      bilat  . Strabismus surgery  12/12/2011    Procedure: REPAIR STRABISMUS;  Surgeon: Derry Skill, MD;  Location: [facility];  Service: Ophthalmology;  Laterality: Right;   No family history on file. History  Substance Use Topics  . Smoking status: Current Every Day Smoker -- 0.50 packs/day  . Smokeless tobacco: Not on file  . Alcohol Use: Yes   OB History   Grav Para Term Preterm Abortions TAB SAB Ect Mult Living                 Review of Systems  Constitutional: Negative for fever.  Gastrointestinal: Positive for vomiting, abdominal  pain and constipation. Negative for nausea, diarrhea and blood in stool.  Genitourinary: Negative for dysuria.  All other systems reviewed and are negative.     Allergies  Review of patient's allergies indicates no known allergies.  Home Medications   Prior to Admission medications   Medication Sig Start Date End Date Taking? Authorizing Provider  amoxicillin (AMOXIL) 500 MG capsule Take 2,000 mg by mouth once as needed. 1 hour before dental appointment or lab work    Historical Provider, MD  ibuprofen (ADVIL,MOTRIN) 200 MG tablet Take 400 mg by mouth once as needed. For fever    Historical Provider, MD  Multiple Vitamin (MULTIVITAMIN WITH MINERALS) TABS Take 1 tablet by mouth daily.    Historical Provider, MD  Omega-3 Fatty Acids (FISH OIL PO) Take 1 capsule by mouth daily.    Historical Provider, MD  omeprazole (PRILOSEC) 40 MG capsule Take 40 mg by mouth daily.    Historical Provider, MD  VITAMIN D, CHOLECALCIFEROL, PO Take 1 tablet by mouth daily.    Historical Provider, MD   BP 141/89  Pulse 100  Temp(Src) 98.6 F (37 C) (Oral)  Resp 20  SpO2 99% Physical Exam  Nursing note and vitals reviewed. Constitutional: She is oriented to person, place, and time. She appears well-developed and well-nourished. No distress.  Appears comfortable in bed  HENT:  Head: Normocephalic and atraumatic.  Right Ear: External ear normal.  Left Ear: External ear normal.  Nose: Nose normal.  Eyes: Right eye exhibits no discharge. Left eye exhibits no discharge.  Cardiovascular: Normal rate, regular rhythm and normal heart sounds.   Pulmonary/Chest: Effort normal and breath sounds normal.  Abdominal: Soft. Normal appearance. She exhibits no distension. There is tenderness in the epigastric area, left upper quadrant and left lower quadrant.  Neurological: She is alert and oriented to person, place, and time.  Skin: Skin is warm and dry.    ED Course  Procedures (including critical care  time) Labs Review Labs Reviewed  CBC WITH DIFFERENTIAL - Abnormal; Notable for the following:    WBC 10.8 (*)    Neutro Abs 7.9 (*)    All other components within normal limits  COMPREHENSIVE METABOLIC PANEL - Abnormal; Notable for the following:    Sodium 136 (*)    Glucose, Bld 139 (*)    All other components within normal limits  LIPASE, BLOOD - Abnormal; Notable for the following:    Lipase 1387 (*)    All other components within normal limits  URINALYSIS, ROUTINE W REFLEX MICROSCOPIC - Abnormal; Notable for the following:    APPearance CLOUDY (*)    All other components within normal limits  PREGNANCY, URINE    Imaging Review Ct Abdomen Pelvis W Contrast  12/04/2013   CLINICAL DATA:  Constipation, abdominal pain.  EXAM: CT ABDOMEN AND PELVIS WITH CONTRAST  TECHNIQUE: Multidetector CT imaging of the abdomen and pelvis was performed using the standard protocol following bolus administration of intravenous contrast.  CONTRAST:  55mL OMNIPAQUE IOHEXOL 300 MG/ML SOLN, 145mL OMNIPAQUE IOHEXOL 300 MG/ML SOLN  COMPARISON:  [facility] 09/15/2007.  FINDINGS: Minimal dependent atelectasis in the lung bases which are otherwise clear. No effusions. Heart is normal size.  13 mm cystic area noted within the pancreatic body. This is well-circumscribed and appears to be unilocular. In addition, distal to this cystic area, there appears to be slight inflammatory change/stranding around the distal pancreatic body and tail suggesting acute pancreatitis.  Liver, gallbladder, spleen, adrenals and kidneys are normal.  Appendix is visualized and is normal. Small follicles or cysts in the ovaries bilaterally, the largest 2.9 cm on the right. IUD is noted in the uterus. Urinary bladder grossly unremarkable although partially obscured by beam hardening artifact from bilateral hip replacements. Stomach, large and small bowel unremarkable.  No acute bony abnormality or focal bone lesion.  IMPRESSION: 13 mm cystic  area within the pancreatic body. This may reflect a pseudocyst, but other cause of cystic mass cannot be excluded.  There also appears to be slight stranding around the distal pancreatic body and pancreatic tail distal to this cystic lesion suggesting mild acute pancreatitis.  Recommend evaluation of the cystic lesions are further with MRI of the abdomen with and without contrast after acute symptoms/pancreatitis resolve.   Electronically Signed   By: Rolm Baptise M.D.   On: 12/04/2013 11:05   Dg Abd Acute W/chest  12/04/2013   CLINICAL DATA:  Abdominal pain and constipation.  EXAM: ACUTE ABDOMEN SERIES (ABDOMEN 2 VIEW & CHEST 1 VIEW)  COMPARISON:  12/01/2011  FINDINGS: Chest radiograph demonstrates clear lungs. Normal appearance of the heart and mediastinum. No evidence for free air. Patient has bilateral hip replacements. There is an IUD in the pelvis. Nonspecific bowel gas pattern. Stool in the transverse colon and right colon. No large abdominal calcifications.  IMPRESSION: No acute cardiopulmonary disease.  Nonspecific bowel gas pattern.  Moderate stool burden.   Electronically Signed   By:  Markus Daft M.D.   On: 12/04/2013 09:49     EKG Interpretation None      MDM   Final diagnoses:  Pancreatitis    Patient's pain significantly improved with one dose of morphine. Pain now down to 2/10. CT shows cyst in body of pancreas, possible pseudocyst. No evidence of constipation on exam. No vomiting, hx of diabetes or unstable vital signs. Given her pain is contorlled, I feel she can be discharged with GI follow up. She has a GI at [facility], d/w Dr. Alonza Bogus, who recommends discharge with clear liquid diet and follow up in their office, will call tomorrow to follow up. She understands return precautions.    Ephraim Hamburger, MD 12/04/13 1754

## 2013-12-04 NOTE — ED Notes (Signed)
States she has been constipated for 4 days. Tried ex-lax and gas-ex without relief. States her abd is bloated.

## 2013-12-04 NOTE — Discharge Instructions (Signed)
Acute Pancreatitis °Acute pancreatitis is a disease in which the pancreas becomes suddenly inflamed. The pancreas is a large gland located behind your stomach. The pancreas produces enzymes that help digest food. The pancreas also releases the hormones glucagon and insulin that help regulate blood sugar. Damage to the pancreas occurs when the digestive enzymes from the pancreas are activated and begin attacking the pancreas before being released into the intestine. Most acute attacks last a couple of days and can cause serious complications. Some people become dehydrated and develop low blood pressure. In severe cases, bleeding into the pancreas can lead to shock and can be life-threatening. The lungs, heart, and kidneys may fail. °CAUSES  °Pancreatitis can happen to anyone. In some cases, the cause is unknown. Most cases are caused by: °· Alcohol abuse. °· Gallstones. °Other less common causes are: °· Certain medicines. °· Exposure to certain chemicals. °· Infection. °· Damage caused by an accident (trauma). °· Abdominal surgery. °SYMPTOMS  °· Pain in the upper abdomen that may radiate to the back. °· Tenderness and swelling of the abdomen. °· Nausea and vomiting. °DIAGNOSIS  °Your caregiver will perform a physical exam. Blood and stool tests may be done to confirm the diagnosis. Imaging tests may also be done, such as X-rays, CT scans, or an ultrasound of the abdomen. °TREATMENT  °Treatment usually requires a stay in the hospital. Treatment may include: °· Pain medicine. °· Fluid replacement through an intravenous line (IV). °· Placing a tube in the stomach to remove stomach contents and control vomiting. °· Not eating for 3 or 4 days. This gives your pancreas a rest, because enzymes are not being produced that can cause further damage. °· Antibiotic medicines if your condition is caused by an infection. °· Surgery of the pancreas or gallbladder. °HOME CARE INSTRUCTIONS  °· Follow the diet advised by your  caregiver. This may involve avoiding alcohol and decreasing the amount of fat in your diet. °· Eat smaller, more frequent meals. This reduces the amount of digestive juices the pancreas produces. °· Drink enough fluids to keep your urine clear or pale yellow. °· Only take over-the-counter or prescription medicines as directed by your caregiver. °· Avoid drinking alcohol if it caused your condition. °· Do not smoke. °· Get plenty of rest. °· Check your blood sugar at home as directed by your caregiver. °· Keep all follow-up appointments as directed by your caregiver. °SEEK MEDICAL CARE IF:  °· You do not recover as quickly as expected. °· You develop new or worsening symptoms. °· You have persistent pain, weakness, or nausea. °· You recover and then have another episode of pain. °SEEK IMMEDIATE MEDICAL CARE IF:  °· You are unable to eat or keep fluids down. °· Your pain becomes severe. °· You have a fever or persistent symptoms for more than 2 to 3 days. °· You have a fever and your symptoms suddenly get worse. °· Your skin or the white part of your eyes turn yellow (jaundice). °· You develop vomiting. °· You feel dizzy, or you faint. °· Your blood sugar is high (over 300 mg/dL). °MAKE SURE YOU:  °· Understand these instructions. °· Will watch your condition. °· Will get help right away if you are not doing well or get worse. °Document Released: 06/09/2005 Document Revised: 12/09/2011 Document Reviewed: 09/18/2011 °[employer]® Patient Information ©2014 [employer]. ° °

## 2013-12-04 NOTE — ED Notes (Signed)
Ice Water Provided. OK per MD. TV on. Pt resting comfortably.

## 2013-12-04 NOTE — ED Notes (Signed)
MD at bedside. 

## 2014-01-06 DIAGNOSIS — K8689 Other specified diseases of pancreas: Secondary | ICD-10-CM | POA: Insufficient documentation

## 2014-01-06 DIAGNOSIS — K859 Acute pancreatitis without necrosis or infection, unspecified: Secondary | ICD-10-CM | POA: Insufficient documentation

## 2014-01-06 DIAGNOSIS — K635 Polyp of colon: Secondary | ICD-10-CM | POA: Insufficient documentation

## 2014-08-22 ENCOUNTER — Emergency Department (HOSPITAL_BASED_OUTPATIENT_CLINIC_OR_DEPARTMENT_OTHER): Payer: BLUE CROSS/BLUE SHIELD

## 2014-08-22 ENCOUNTER — Emergency Department (HOSPITAL_BASED_OUTPATIENT_CLINIC_OR_DEPARTMENT_OTHER)
Admission: EM | Admit: 2014-08-22 | Discharge: 2014-08-22 | Disposition: A | Discharge status: Home / Self Care | Payer: BLUE CROSS/BLUE SHIELD | Attending: Emergency Medicine | Admitting: Emergency Medicine

## 2014-08-22 ENCOUNTER — Other Ambulatory Visit: Payer: Self-pay

## 2014-08-22 ENCOUNTER — Encounter (HOSPITAL_BASED_OUTPATIENT_CLINIC_OR_DEPARTMENT_OTHER): Payer: Self-pay | Admitting: *Deleted

## 2014-08-22 DIAGNOSIS — K859 Acute pancreatitis, unspecified: Secondary | ICD-10-CM

## 2014-08-22 DIAGNOSIS — Z86010 Personal history of colonic polyps: Secondary | ICD-10-CM | POA: Insufficient documentation

## 2014-08-22 DIAGNOSIS — Z720 Tobacco use: Secondary | ICD-10-CM | POA: Diagnosis not present

## 2014-08-22 DIAGNOSIS — Z791 Long term (current) use of non-steroidal anti-inflammatories (NSAID): Secondary | ICD-10-CM | POA: Diagnosis not present

## 2014-08-22 DIAGNOSIS — Z872 Personal history of diseases of the skin and subcutaneous tissue: Secondary | ICD-10-CM | POA: Diagnosis not present

## 2014-08-22 DIAGNOSIS — R109 Unspecified abdominal pain: Secondary | ICD-10-CM

## 2014-08-22 DIAGNOSIS — Z79899 Other long term (current) drug therapy: Secondary | ICD-10-CM | POA: Insufficient documentation

## 2014-08-22 DIAGNOSIS — K219 Gastro-esophageal reflux disease without esophagitis: Secondary | ICD-10-CM | POA: Insufficient documentation

## 2014-08-22 DIAGNOSIS — R1013 Epigastric pain: Secondary | ICD-10-CM | POA: Diagnosis present

## 2014-08-22 LAB — CBC WITH DIFFERENTIAL/PLATELET
BASOS ABS: 0 10*3/uL (ref 0.0–0.1)
Basophils Relative: 0 % (ref 0–1)
EOS ABS: 0.1 10*3/uL (ref 0.0–0.7)
EOS PCT: 1 % (ref 0–5)
HCT: 41.2 % (ref 36.0–46.0)
Hemoglobin: 13.6 g/dL (ref 12.0–15.0)
Lymphocytes Relative: 25 % (ref 12–46)
Lymphs Abs: 2.5 10*3/uL (ref 0.7–4.0)
MCH: 27.5 pg (ref 26.0–34.0)
MCHC: 33 g/dL (ref 30.0–36.0)
MCV: 83.2 fL (ref 78.0–100.0)
MONO ABS: 0.8 10*3/uL (ref 0.1–1.0)
Monocytes Relative: 8 % (ref 3–12)
Neutro Abs: 6.6 10*3/uL (ref 1.7–7.7)
Neutrophils Relative %: 66 % (ref 43–77)
PLATELETS: 285 10*3/uL (ref 150–400)
RBC: 4.95 MIL/uL (ref 3.87–5.11)
RDW: 13.5 % (ref 11.5–15.5)
WBC: 10.1 10*3/uL (ref 4.0–10.5)

## 2014-08-22 LAB — TROPONIN I

## 2014-08-22 LAB — HEPATIC FUNCTION PANEL
ALT: 23 U/L (ref 0–35)
AST: 32 U/L (ref 0–37)
Albumin: 3.9 g/dL (ref 3.5–5.2)
Alkaline Phosphatase: 88 U/L (ref 39–117)
BILIRUBIN DIRECT: 0.1 mg/dL (ref 0.0–0.5)
BILIRUBIN INDIRECT: 0.2 mg/dL — AB (ref 0.3–0.9)
BILIRUBIN TOTAL: 0.3 mg/dL (ref 0.3–1.2)
Total Protein: 7.9 g/dL (ref 6.0–8.3)

## 2014-08-22 LAB — BASIC METABOLIC PANEL
ANION GAP: 6 (ref 5–15)
BUN: 10 mg/dL (ref 6–23)
CALCIUM: 8.4 mg/dL (ref 8.4–10.5)
CO2: 23 mmol/L (ref 19–32)
Chloride: 101 mmol/L (ref 96–112)
Creatinine, Ser: 0.61 mg/dL (ref 0.50–1.10)
Glucose, Bld: 234 mg/dL — ABNORMAL HIGH (ref 70–99)
POTASSIUM: 4 mmol/L (ref 3.5–5.1)
SODIUM: 130 mmol/L — AB (ref 135–145)

## 2014-08-22 LAB — LIPASE, BLOOD: LIPASE: 524 U/L — AB (ref 11–59)

## 2014-08-22 LAB — D-DIMER, QUANTITATIVE: D-Dimer, Quant: 0.34 ug/mL-FEU (ref 0.00–0.48)

## 2014-08-22 MED ORDER — GI COCKTAIL ~~LOC~~
30.0000 mL | Freq: Once | ORAL | Status: AC
  Administered 2014-08-22: 30 mL via ORAL
  Filled 2014-08-22: qty 30

## 2014-08-22 MED ORDER — ONDANSETRON 4 MG PO TBDP: ORAL_TABLET | ORAL | Status: DC

## 2014-08-22 MED ORDER — TRAMADOL HCL 50 MG PO TABS: 50.0000 mg | ORAL_TABLET | Freq: Four times a day (QID) | ORAL | Status: DC | PRN

## 2014-08-22 NOTE — Discharge Instructions (Signed)

## 2014-08-22 NOTE — ED Provider Notes (Addendum)
CSN: [id number]     Arrival date & time 08/22/14  1452 History   First MD Initiated Contact with Patient 08/22/14 1600     Chief Complaint  Patient presents with  . Chest Pain     (Consider location/radiation/quality/duration/timing/severity/associated sxs/prior Treatment) HPI Comments: Patient complains of pain in her epigastrium and across her left upper abdomen. She states it's been going on for about a week and half. It's been constant and worsening. She's had some nausea but no vomiting. She's occasionally felt lightheaded. She denies any shortness of breath but occasionally it hurts to breathe. She's been seen at an urgent care.  She had an upper abdominal ultrasound done which was negative a few days ago. She denies any fevers or chills. She denies any cough or chest congestion. She denies any diarrhea or constipation. She denies any pain radiating to her chest. She denies any urinary symptoms. She does have a history of gastroesophageal reflux disease and prior peptic ulcer disease. She's previously seen a gastroenterologist associated with [facility]. She has a history of a pancreatic cyst that was drained last year by a gastroenterology specialist at [facility].  Patient is a 44 y.o. female presenting with chest pain.  Chest Pain Associated symptoms: abdominal pain and nausea   Associated symptoms: no back pain, no cough, no diaphoresis, no dizziness, no fatigue, no fever, no headache, no numbness, no shortness of breath, not vomiting and no weakness     Past Medical History  Diagnosis Date  . GERD (gastroesophageal reflux disease)   . Presence of artificial hip     has to take antibiotics prior to any procedure-dental, etc  . Ulcer   . Colon polyps    Past Surgical History  Procedure Laterality Date  . Total hip arthroplasty      bilat  . Strabismus surgery  12/12/2011    Procedure: REPAIR STRABISMUS;  Surgeon: Derry Skill, MD;  Location: [facility];   Service: Ophthalmology;  Laterality: Right;   No family history on file. History  Substance Use Topics  . Smoking status: Current Every Day Smoker -- 0.50 packs/day  . Smokeless tobacco: Not on file  . Alcohol Use: Yes   OB History    No data available     Review of Systems  Constitutional: Negative for fever, chills, diaphoresis and fatigue.  HENT: Negative for congestion, rhinorrhea and sneezing.   Eyes: Negative.   Respiratory: Negative for cough, chest tightness and shortness of breath.   Cardiovascular: Negative for chest pain and leg swelling.  Gastrointestinal: Positive for nausea and abdominal pain. Negative for vomiting, diarrhea and blood in stool.  Genitourinary: Negative for frequency, hematuria, flank pain and difficulty urinating.  Musculoskeletal: Negative for back pain and arthralgias.  Skin: Negative for rash.  Neurological: Positive for light-headedness. Negative for dizziness, speech difficulty, weakness, numbness and headaches.      Allergies  Review of patient's allergies indicates no known allergies.  Home Medications   Prior to Admission medications   Medication Sig Start Date End Date Taking? Authorizing Provider  meloxicam (MOBIC) 7.5 MG tablet Take 7.5 mg by mouth daily.   Yes Historical Provider, MD  methocarbamol (ROBAXIN) 500 MG tablet Take 500 mg by mouth 4 (four) times daily.   Yes Historical Provider, MD  amoxicillin (AMOXIL) 500 MG capsule Take 2,000 mg by mouth once as needed. 1 hour before dental appointment or lab work    Historical Provider, MD  ibuprofen (ADVIL,MOTRIN) 200 MG  tablet Take 400 mg by mouth once as needed. For fever    Historical Provider, MD  Multiple Vitamin (MULTIVITAMIN WITH MINERALS) TABS Take 1 tablet by mouth daily.    Historical Provider, MD  Omega-3 Fatty Acids (FISH OIL PO) Take 1 capsule by mouth daily.    Historical Provider, MD  omeprazole (PRILOSEC) 40 MG capsule Take 40 mg by mouth daily.    Historical  Provider, MD  ondansetron (ZOFRAN ODT) 4 MG disintegrating tablet 4mg  ODT q4 hours prn nausea/vomit 08/22/14   Malvin Johns, MD  oxyCODONE-acetaminophen (PERCOCET/ROXICET) 5-325 MG per tablet Take 1-2 tablets by mouth every 4 (four) hours as needed for moderate pain or severe pain. 12/04/13   Ephraim Hamburger, MD  traMADol (ULTRAM) 50 MG tablet Take 1 tablet (50 mg total) by mouth every 6 (six) hours as needed. 08/22/14   Malvin Johns, MD  VITAMIN D, CHOLECALCIFEROL, PO Take 1 tablet by mouth daily.    Historical Provider, MD   BP 136/84 mmHg  Pulse 95  Temp(Src) 98.3 F (36.8 C)  Resp 16  Ht 5' (1.524 m)  Wt 180 lb (81.647 kg)  BMI 35.15 kg/m2  SpO2 96% Physical Exam  Constitutional: She is oriented to person, place, and time. She appears well-developed and well-nourished.  HENT:  Head: Normocephalic and atraumatic.  Eyes: Pupils are equal, round, and reactive to light.  Neck: Normal range of motion. Neck supple.  Cardiovascular: Normal rate, regular rhythm and normal heart sounds.   Pulmonary/Chest: Effort normal and breath sounds normal. No respiratory distress. She has no wheezes. She has no rales. She exhibits no tenderness.  Abdominal: Soft. Bowel sounds are normal. There is tenderness (positive tenderness in the epigastrium and left upper abdomen). There is no rebound and no guarding.  Musculoskeletal: Normal range of motion. She exhibits no edema.  No calf tenderness  Lymphadenopathy:    She has no cervical adenopathy.  Neurological: She is alert and oriented to person, place, and time.  Skin: Skin is warm and dry. No rash noted.  Psychiatric: She has a normal mood and affect.    ED Course  Procedures (including critical care time) Labs Review Labs Reviewed  BASIC METABOLIC PANEL - Abnormal; Notable for the following:    Sodium 130 (*)    Glucose, Bld 234 (*)    All other components within normal limits  LIPASE, BLOOD - Abnormal; Notable for the following:    Lipase 524  (*)    All other components within normal limits  HEPATIC FUNCTION PANEL - Abnormal; Notable for the following:    Indirect Bilirubin 0.2 (*)    All other components within normal limits  TROPONIN I  CBC WITH DIFFERENTIAL/PLATELET  D-DIMER, QUANTITATIVE    Imaging Review No results found.   EKG Interpretation   Date/Time:  Tuesday August 22 2014 14:57:19 EST Ventricular Rate:  112 PR Interval:  146 QRS Duration: 84 QT Interval:  350 QTC Calculation: 477 R Axis:   70 Text Interpretation:  Sinus tachycardia Otherwise normal ECG No old  tracing to compare Confirmed by JACUBOWITZ  MD, SAM [phone]) on 08/22/2014  3:00:25 PM      MDM   Final diagnoses:  Abdominal pain  Acute pancreatitis, unspecified pancreatitis type    Patient presents with upper abdominal pain. She's had a recent ultrasound of her gallbladder which was negative. I reviewed these results in PACS. She has a history of pancreatitis in the past with an associated cyst which was previously  drained. She has evidence of pancreatitis today. I encouraged her to have another CT scan today to reassess whether she's developed another pseudocyst. However patient does want to have an imaging study today. She wants to follow-up with her gastroenterologist at [facility]. She is well-appearing. Her pain is controlled. She's afebrile. She's had no vomiting. She is planning on following up with her primary care physician and her gastroenterologist. She was given a prescription for tramadol for pain. She states she doesn't do well with a stronger narcotic medications that she has vomiting with these. She was also given prescription for Zofran although currently she is denying any ongoing nausea. She was advised to return if she has any worsening pain vomiting or fevers. Her glucose is elevated and will need outpatient follow-up by her primary care physician.    Malvin Johns, MD 08/22/14 [id number]  Malvin Johns, MD 08/22/14 2258

## 2014-08-22 NOTE — ED Notes (Signed)
MD at bedside. 

## 2014-08-22 NOTE — ED Notes (Signed)
Pt c/o "knot like" pain in her central chest x5 days. She sts some SOB, dizziness, cold sweats and nausea on the 2nd day but no other symptoms since.

## 2015-08-18 DIAGNOSIS — R12 Heartburn: Secondary | ICD-10-CM | POA: Insufficient documentation

## 2015-08-18 DIAGNOSIS — E118 Type 2 diabetes mellitus with unspecified complications: Secondary | ICD-10-CM

## 2015-08-18 DIAGNOSIS — E559 Vitamin D deficiency, unspecified: Secondary | ICD-10-CM | POA: Insufficient documentation

## 2015-08-18 DIAGNOSIS — L659 Nonscarring hair loss, unspecified: Secondary | ICD-10-CM | POA: Insufficient documentation

## 2015-08-18 DIAGNOSIS — N915 Oligomenorrhea, unspecified: Secondary | ICD-10-CM | POA: Insufficient documentation

## 2015-08-18 DIAGNOSIS — R1013 Epigastric pain: Secondary | ICD-10-CM | POA: Insufficient documentation

## 2015-08-18 DIAGNOSIS — N393 Stress incontinence (female) (male): Secondary | ICD-10-CM | POA: Insufficient documentation

## 2015-08-18 DIAGNOSIS — E871 Hypo-osmolality and hyponatremia: Secondary | ICD-10-CM | POA: Insufficient documentation

## 2015-08-18 DIAGNOSIS — K219 Gastro-esophageal reflux disease without esophagitis: Secondary | ICD-10-CM | POA: Insufficient documentation

## 2015-08-18 DIAGNOSIS — Z79899 Other long term (current) drug therapy: Secondary | ICD-10-CM | POA: Insufficient documentation

## 2015-08-18 DIAGNOSIS — G5793 Unspecified mononeuropathy of bilateral lower limbs: Secondary | ICD-10-CM | POA: Insufficient documentation

## 2015-08-18 DIAGNOSIS — R062 Wheezing: Secondary | ICD-10-CM | POA: Insufficient documentation

## 2015-08-18 DIAGNOSIS — K2100 Gastro-esophageal reflux disease with esophagitis, without bleeding: Secondary | ICD-10-CM | POA: Insufficient documentation

## 2015-08-18 DIAGNOSIS — E785 Hyperlipidemia, unspecified: Secondary | ICD-10-CM | POA: Insufficient documentation

## 2015-08-18 DIAGNOSIS — E1165 Type 2 diabetes mellitus with hyperglycemia: Secondary | ICD-10-CM | POA: Insufficient documentation

## 2015-08-18 DIAGNOSIS — E1140 Type 2 diabetes mellitus with diabetic neuropathy, unspecified: Secondary | ICD-10-CM | POA: Insufficient documentation

## 2015-08-18 DIAGNOSIS — E669 Obesity, unspecified: Secondary | ICD-10-CM | POA: Insufficient documentation

## 2015-08-18 DIAGNOSIS — F17200 Nicotine dependence, unspecified, uncomplicated: Secondary | ICD-10-CM | POA: Insufficient documentation

## 2015-08-18 DIAGNOSIS — IMO0002 Reserved for concepts with insufficient information to code with codable children: Secondary | ICD-10-CM | POA: Insufficient documentation

## 2015-08-18 DIAGNOSIS — H60392 Other infective otitis externa, left ear: Secondary | ICD-10-CM | POA: Insufficient documentation

## 2015-08-18 DIAGNOSIS — G4700 Insomnia, unspecified: Secondary | ICD-10-CM | POA: Insufficient documentation

## 2015-08-18 DIAGNOSIS — I10 Essential (primary) hypertension: Secondary | ICD-10-CM | POA: Insufficient documentation

## 2015-08-18 DIAGNOSIS — D126 Benign neoplasm of colon, unspecified: Secondary | ICD-10-CM | POA: Insufficient documentation

## 2015-09-23 DIAGNOSIS — Z6835 Body mass index (BMI) 35.0-35.9, adult: Secondary | ICD-10-CM | POA: Insufficient documentation

## 2015-09-23 DIAGNOSIS — R768 Other specified abnormal immunological findings in serum: Secondary | ICD-10-CM | POA: Insufficient documentation

## 2015-09-23 DIAGNOSIS — Z86010 Personal history of colonic polyps: Secondary | ICD-10-CM | POA: Insufficient documentation

## 2015-10-22 DIAGNOSIS — M0579 Rheumatoid arthritis with rheumatoid factor of multiple sites without organ or systems involvement: Secondary | ICD-10-CM | POA: Insufficient documentation

## 2015-10-22 DIAGNOSIS — Z0000 Encounter for general adult medical examination without abnormal findings: Secondary | ICD-10-CM | POA: Insufficient documentation

## 2016-06-17 ENCOUNTER — Emergency Department: Payer: Managed Care, Other (non HMO)

## 2016-06-17 ENCOUNTER — Emergency Department
Admission: EM | Admit: 2016-06-17 | Discharge: 2016-06-17 | Disposition: A | Discharge status: Home / Self Care | Payer: Managed Care, Other (non HMO) | Attending: Emergency Medicine | Admitting: Emergency Medicine

## 2016-06-17 ENCOUNTER — Encounter: Payer: Self-pay | Admitting: Emergency Medicine

## 2016-06-17 DIAGNOSIS — Z79899 Other long term (current) drug therapy: Secondary | ICD-10-CM | POA: Insufficient documentation

## 2016-06-17 DIAGNOSIS — R1010 Upper abdominal pain, unspecified: Secondary | ICD-10-CM | POA: Diagnosis present

## 2016-06-17 DIAGNOSIS — E119 Type 2 diabetes mellitus without complications: Secondary | ICD-10-CM | POA: Diagnosis not present

## 2016-06-17 DIAGNOSIS — K852 Alcohol induced acute pancreatitis without necrosis or infection: Secondary | ICD-10-CM | POA: Insufficient documentation

## 2016-06-17 DIAGNOSIS — F17200 Nicotine dependence, unspecified, uncomplicated: Secondary | ICD-10-CM | POA: Diagnosis not present

## 2016-06-17 DIAGNOSIS — K863 Pseudocyst of pancreas: Secondary | ICD-10-CM | POA: Diagnosis not present

## 2016-06-17 HISTORY — DX: Type 2 diabetes mellitus without complications: E11.9

## 2016-06-17 HISTORY — DX: Acute pancreatitis without necrosis or infection, unspecified: K85.90

## 2016-06-17 LAB — CBC
HEMATOCRIT: 44 % (ref 35.0–47.0)
Hemoglobin: 14.4 g/dL (ref 12.0–16.0)
MCH: 29.3 pg (ref 26.0–34.0)
MCHC: 32.8 g/dL (ref 32.0–36.0)
MCV: 89.4 fL (ref 80.0–100.0)
PLATELETS: 234 10*3/uL (ref 150–440)
RBC: 4.93 MIL/uL (ref 3.80–5.20)
RDW: 14.2 % (ref 11.5–14.5)
WBC: 8.3 10*3/uL (ref 3.6–11.0)

## 2016-06-17 LAB — URINALYSIS, COMPLETE (UACMP) WITH MICROSCOPIC
BACTERIA UA: NONE SEEN
Bilirubin Urine: NEGATIVE
Glucose, UA: NEGATIVE mg/dL
HGB URINE DIPSTICK: NEGATIVE
Ketones, ur: NEGATIVE mg/dL
Nitrite: NEGATIVE
Protein, ur: NEGATIVE mg/dL
SPECIFIC GRAVITY, URINE: 1.017 (ref 1.005–1.030)
pH: 5 (ref 5.0–8.0)

## 2016-06-17 LAB — COMPREHENSIVE METABOLIC PANEL
ALK PHOS: 74 U/L (ref 38–126)
ALT: 18 U/L (ref 14–54)
AST: 26 U/L (ref 15–41)
Albumin: 4.6 g/dL (ref 3.5–5.0)
Anion gap: 9 (ref 5–15)
BILIRUBIN TOTAL: 0.3 mg/dL (ref 0.3–1.2)
BUN: 15 mg/dL (ref 6–20)
CALCIUM: 9.4 mg/dL (ref 8.9–10.3)
CO2: 22 mmol/L (ref 22–32)
CREATININE: 0.7 mg/dL (ref 0.44–1.00)
Chloride: 105 mmol/L (ref 101–111)
GFR calc Af Amer: >60 mL/min (ref >60)
GFR calc non Af Amer: >60 mL/min (ref >60)
GLUCOSE: 136 mg/dL — AB (ref 65–99)
POTASSIUM: 3.7 mmol/L (ref 3.5–5.1)
Sodium: 136 mmol/L (ref 135–145)
TOTAL PROTEIN: 8.9 g/dL — AB (ref 6.5–8.1)

## 2016-06-17 LAB — LIPASE, BLOOD: Lipase: 456 U/L — ABNORMAL HIGH (ref 11–51)

## 2016-06-17 LAB — POCT PREGNANCY, URINE: PREG TEST UR: NEGATIVE

## 2016-06-17 MED ORDER — OXYCODONE-ACETAMINOPHEN 5-325 MG PO TABS: 2.0000 | ORAL_TABLET | Freq: Four times a day (QID) | ORAL | 0 refills | Status: DC | PRN

## 2016-06-17 MED ORDER — OXYCODONE-ACETAMINOPHEN 5-325 MG PO TABS
ORAL_TABLET | ORAL | Status: AC
  Administered 2016-06-17: 2 via ORAL
  Filled 2016-06-17: qty 2

## 2016-06-17 MED ORDER — IOPAMIDOL (ISOVUE-300) INJECTION 61%
30.0000 mL | Freq: Once | INTRAVENOUS | Status: AC
  Administered 2016-06-17: 30 mL via ORAL
  Filled 2016-06-17: qty 30

## 2016-06-17 MED ORDER — ONDANSETRON HCL 4 MG PO TABS: 4.0000 mg | ORAL_TABLET | Freq: Every day | ORAL | 1 refills | Status: DC | PRN

## 2016-06-17 MED ORDER — IOPAMIDOL (ISOVUE-300) INJECTION 61%
100.0000 mL | Freq: Once | INTRAVENOUS | Status: AC | PRN
  Administered 2016-06-17: 100 mL via INTRAVENOUS
  Filled 2016-06-17: qty 100

## 2016-06-17 MED ORDER — OXYCODONE-ACETAMINOPHEN 5-325 MG PO TABS
2.0000 | ORAL_TABLET | Freq: Once | ORAL | Status: AC
  Administered 2016-06-17: 2 via ORAL

## 2016-06-17 NOTE — ED Notes (Signed)
Attempted IV x 1 unsuccessful.

## 2016-06-17 NOTE — ED Notes (Signed)
Patient transported to CT via stretcher.

## 2016-06-17 NOTE — ED Provider Notes (Signed)
[facility] Emergency Department Provider Note        Time seen: ----------------------------------------- 5:36 PM on 06/17/2016 -----------------------------------------    I have reviewed the triage vital signs and the nursing notes.   HISTORY  Chief Complaint Abdominal Pain    HPI April Chaney is a 45 y.o. female who presents to the ER for upper abdominal pain for the last 5 days is worst after eating. Patient states she feels bloated and feels like she has a lot of gas. She took a laxative yesterday without any improvement, has not had vomiting or diarrhea. She was seen at an urgent care 2 days ago and given Prilosec with hyoscyamine without any improvement. She denies fevers, chills or other complaints. She did have a similar symptoms last year was diagnosed with pancreatitis but states she does not drink anymore.   Past Medical History:  Diagnosis Date  . Colon polyps   . Diabetes mellitus without complication ([place])   . GERD (gastroesophageal reflux disease)   . Pancreatitis   . Presence of artificial hip    has to take antibiotics prior to any procedure-dental, etc  . Ulcer ([place])     There are no active problems to display for this patient.   Past Surgical History:  Procedure Laterality Date  . JOINT REPLACEMENT     hip replacement  . STRABISMUS SURGERY  12/12/2011   Procedure: REPAIR STRABISMUS;  Surgeon: Derry Skill, MD;  Location: [place];  Service: Ophthalmology;  Laterality: Right;  . TOTAL HIP ARTHROPLASTY     bilat    Allergies Patient has no known allergies.  Social History Social History  Substance Use Topics  . Smoking status: Current Every Day Smoker    Packs/day: 0.50  . Smokeless tobacco: Not on file  . Alcohol use Yes    Review of Systems Constitutional: Negative for fever. Cardiovascular: Negative for chest pain. Respiratory: Negative for shortness of breath. Gastrointestinal:  Positive for abdominal pain, negative for vomiting and diarrhea Genitourinary: Negative for dysuria. Musculoskeletal: Negative for back pain. Skin: Negative for rash. Neurological: Negative for headaches, focal weakness or numbness.  10-point ROS otherwise negative.  ____________________________________________   PHYSICAL EXAM:  VITAL SIGNS: ED Triage Vitals [06/17/16 1703]  Enc Vitals Group     BP 108/79     Pulse Rate 94     Resp 18     Temp 98.5 F (36.9 C)     Temp Source Oral     SpO2 100 %     Weight 154 lb (69.9 kg)     Height 5\' 1"  (1.549 m)     Head Circumference      Peak Flow      Pain Score 8     Pain Loc      Pain Edu?      Excl. in [place]?     Constitutional: Alert and oriented. Well appearing and in no distress. Eyes: Conjunctivae are normal. PERRL. Normal extraocular movements. ENT   Head: Normocephalic and atraumatic.   Nose: No congestion/rhinnorhea.   Mouth/Throat: Mucous membranes are moist.   Neck: No stridor. Cardiovascular: Normal rate, regular rhythm. No murmurs, rubs, or gallops. Respiratory: Normal respiratory effort without tachypnea nor retractions. Breath sounds are clear and equal bilaterally. No wheezes/rales/rhonchi. Gastrointestinal: Epigastric tenderness, no rebound or guarding. Normal bowel sounds. Musculoskeletal: Nontender with normal range of motion in all extremities. No lower extremity tenderness nor edema. Neurologic:  Normal speech and language. No gross  focal neurologic deficits are appreciated.  Skin:  Skin is warm, dry and intact. No rash noted. Psychiatric: Mood and affect are normal. Speech and behavior are normal.  ____________________________________________  ED COURSE:  Pertinent labs & imaging results that were available during my care of the patient were reviewed by me and considered in my medical decision making (see chart for details). Clinical Course   Patient presents to the ER with epigastric pain of  uncertain etiology. We will assess with labs and imaging.  Procedures ____________________________________________   LABS (pertinent positives/negatives)  Labs Reviewed  LIPASE, BLOOD - Abnormal; Notable for the following:       Result Value   Lipase 456 (*)    All other components within normal limits  COMPREHENSIVE METABOLIC PANEL - Abnormal; Notable for the following:    Glucose, Bld 136 (*)    Total Protein 8.9 (*)    All other components within normal limits  URINALYSIS, COMPLETE (UACMP) WITH MICROSCOPIC - Abnormal; Notable for the following:    Color, Urine YELLOW (*)    APPearance CLEAR (*)    Leukocytes, UA TRACE (*)    Squamous Epithelial / LPF 0-5 (*)    All other components within normal limits  CBC  POCT PREGNANCY, URINE    RADIOLOGY Images were viewed by me  CT of abdomen and pelvis with contrast  IMPRESSION: 1. No acute findings within the abdomen or pelvis. 2. The appendix is visualized and appears normal. 3. There is a enlarging cystic structure within the tail of pancreas. In a patient who has a history of pancreatitis this may represent a pseudocyst. Enlarging cystic neoplasm of the pancreas may have a similar appearance. Recommend further evaluation with nonemergent followup imaging in 6 months with pancreas protocol MRI or CT is recommended. ____________________________________________  FINAL ASSESSMENT AND PLAN  Pancreatitis, pancreatic pseudocyst  Plan: Patient with labs and imaging as dictated above. Patient is in no distress, does admit to drinking alcohol although not as much as she used to. I've advised her to stop drinking without fail. She'll be given pain medicine and antiemetics and referred to gastroenterology for outpatient follow-up.   Earleen Newport, MD   Note: This dictation was prepared with Dragon dictation. Any transcriptional errors that result from this process are unintentional    Earleen Newport, MD 06/17/16  2105

## 2016-06-17 NOTE — ED Notes (Signed)
ED Provider at bedside. 

## 2016-06-17 NOTE — ED Triage Notes (Signed)
Patient presents to the ED with intermittent upper abdominal pain x 5 days that is worse after eating.  Patient reports feeling bloated and having gas.  Patient denies vomiting and diarrhea.  Patient states she was seen at the Urgent Care 2 days ago and given prilosec and hyoscyamine sulfate for symptoms and patient reports no improvement.  Patient is in no obvious distress at this time.

## 2016-06-17 NOTE — ED Notes (Signed)
No improvement since being seen at Urgent Care with prescribed medications taken as directed X 5 days ago. Pt alert and oriented X4, active, cooperative, pt in NAD. RR even and unlabored, color WNL.  Pt ambulatory to room with ease.

## 2016-06-27 ENCOUNTER — Other Ambulatory Visit: Payer: Self-pay | Admitting: *Deleted

## 2016-07-02 ENCOUNTER — Encounter: Payer: Self-pay | Admitting: Gastroenterology

## 2016-07-02 ENCOUNTER — Ambulatory Visit (INDEPENDENT_AMBULATORY_CARE_PROVIDER_SITE_OTHER): Payer: Managed Care, Other (non HMO) | Admitting: Gastroenterology

## 2016-07-02 VITALS — BP 136/88 | HR 123 | Temp 99.2°F | Ht 61.5 in | Wt 157.0 lb

## 2016-07-02 DIAGNOSIS — F1010 Alcohol abuse, uncomplicated: Secondary | ICD-10-CM

## 2016-07-02 DIAGNOSIS — K859 Acute pancreatitis without necrosis or infection, unspecified: Secondary | ICD-10-CM

## 2016-07-02 DIAGNOSIS — K862 Cyst of pancreas: Secondary | ICD-10-CM

## 2016-07-02 NOTE — Patient Instructions (Addendum)
You are scheduled for a MRCP  @ [facility] on 07/21/16 @ 8:00am arrive at 7:45am You cannot have anything to eat or drink 6 hrs before image. Please check in at the medical mall registration desk.

## 2016-07-02 NOTE — Progress Notes (Signed)
Gastroenterology Consultation  Referring Provider:     Garlan Fillers, MD Primary Care Physician:  Garlan Fillers, MD Primary Gastroenterologist:  Dr. Jonathon Bellows  Reason for Consultation:     Pancreatitis.         HPI:   April Chaney is a 46 y.o. y/o female referred for consultation & management  by Dr. Garlan Fillers, MD.    She has been referred for alcoholic pancreatitis. She was seen at the ER on 06/17/16 when she presented with abdominal pain for 5 days . A similar episode had occurred a year prior . She underwent a CT scan of the abdomen and was found to have a cystic structure in the tail of the pancreas . Her lipase was elevated at 456. LFT's were normal . Hb 14.4 with no leucocytosis. She was discharged from the ER  I do note that she has had an elevated lipase 1 year and 2 years back which were much higher at 524 and 1387. No abnormal Lft's noted in the past.   A CT scan in 2015 showed a 13 mm cystic area in the pancreatic body which was described as a possible pseudocyst and features of pancreatitis were seen at the tail but no cyst .   Back in 2015 she was seen by [place] , she had been consuming large qty of alcohol .She underwent EUS and FNA of the cyst - I do not see any follow up visits after. She said she didn't follow up and was told "she was fine"  [facility] in 07/2014 showed no gall stones and fatty infiltration of the liver  Between 2015-2017 no issues with her abdomen .   Since leaving the ER she has been doing well . No issues presently . No herbal medications or weight loss supplements. No new medications.   She consumes 2-3 drinks of rum and coke . The day prior to the ER was on vacation and was under a lot of stress and had a few more drinks - probably 6 drinks in total . She has consumed alcohol for many years. She did drink more alcohol in the past .   She smokes cigarettes. No family history of pancreatitis or pancreatic cancer. She says she has  lost weight due to eating healthy and gained some weight recently.    Past Medical History:  Diagnosis Date  . Colon polyps   . Diabetes mellitus without complication ([place])   . GERD (gastroesophageal reflux disease)   . Pancreatitis   . Presence of artificial hip    has to take antibiotics prior to any procedure-dental, etc  . Ulcer [facility])     Past Surgical History:  Procedure Laterality Date  . JOINT REPLACEMENT     hip replacement  . STRABISMUS SURGERY  12/12/2011   Procedure: REPAIR STRABISMUS;  Surgeon: Derry Skill, MD;  Location: [place];  Service: Ophthalmology;  Laterality: Right;  . TOTAL HIP ARTHROPLASTY     bilat    Prior to Admission medications   Medication Sig Start Date End Date Taking? Authorizing Provider  amoxicillin (AMOXIL) 500 MG capsule Take 2,000 mg by mouth once as needed. 1 hour before dental appointment or lab work   Yes Historical Provider, MD  buPROPion (WELLBUTRIN XL) 300 MG 24 hr tablet Take 300 mg by mouth. 01/03/16  Yes Historical Provider, MD  levonorgestrel (MIRENA) 20 MCG/24HR IUD by Intrauterine route.   Yes Historical Provider, MD  metformin Lenoard Aden)  500 MG (OSM) 24 hr tablet Take 500 mg by mouth. 11/08/15  Yes Historical Provider, MD  Multiple Vitamin (MULTIVITAMIN WITH MINERALS) TABS Take 1 tablet by mouth daily.   Yes Historical Provider, MD  Omega-3 Fatty Acids (FISH OIL PO) Take 1 capsule by mouth daily.   Yes Historical Provider, MD  omeprazole (PRILOSEC) 20 MG capsule TK 1 C PO D 06/15/16  Yes Historical Provider, MD  Omeprazole Magnesium ([employer] OMEPRAZOLE) 20.6 (20 Base) MG CPDR Take 1 capsule daily   Yes Historical Provider, MD  ondansetron (ZOFRAN) 4 MG tablet Take 1 tablet (4 mg total) by mouth daily as needed for nausea or vomiting. 06/17/16  Yes Earleen Newport, MD  Prenatal Multivit-Min-Fe-FA (PRENATAL/IRON PO) Take 19 mLs by mouth.   Yes Historical Provider, MD  Specialty Vitamins Products (CENTRUM  PERFORMANCE PO)    Yes Historical Provider, MD    Family History  Problem Relation Age of Onset  . Diabetes Mother   . Diabetes Father   . Hypertension Father      Social History  Substance Use Topics  . Smoking status: Current Every Day Smoker    Packs/day: 0.50  . Smokeless tobacco: Never Used  . Alcohol use Yes    Allergies as of 07/02/2016  . (No Known Allergies)    Review of Systems:    All systems reviewed and negative except where noted in HPI.   Physical Exam:  BP 136/88   Pulse (!) 123   Temp 99.2 F (37.3 C)   Ht 5' 1.5" (1.562 m)   Wt 157 lb (71.2 kg)   BMI 29.18 kg/m  No LMP recorded. Patient is not currently having periods (Reason: IUD). Psych:  Alert and cooperative. Normal mood and affect. General:   Alert,  Well-developed, well-nourished, pleasant and cooperative in NAD Head:  Normocephalic and atraumatic. Eyes:  Sclera clear, no icterus.   Conjunctiva pink. Ears:  Normal auditory acuity. Nose:  No deformity, discharge, or lesions. Mouth:  No deformity or lesions,oropharynx pink & moist. Neck:  Supple; no masses or thyromegaly. Lungs:  Respirations even and unlabored.  Clear throughout to auscultation.   No wheezes, crackles, or rhonchi. No acute distress. Heart:  Regular rate and rhythm; no murmurs, clicks, rubs, or gallops. Abdomen:  Normal bowel sounds.  No bruits.  Soft, non-tender and non-distended without masses, hepatosplenomegaly or hernias noted.  No guarding or rebound tenderness.    Msk:  Symmetrical without gross deformities. Good, equal movement & strength bilaterally. Pulses:  Normal pulses noted. Extremities:  No clubbing or edema.  No cyanosis. Neurologic:  Alert and oriented x3;  grossly normal neurologically. Skin:  Intact without significant lesions or rashes. No jaundice. Lymph Nodes:  No significant cervical adenopathy. Psych:  Alert and cooperative. Normal mood and affect.  Imaging Studies: Ct Abdomen Pelvis W  Contrast  Result Date: 06/17/2016 CLINICAL DATA:  Intermittent upper abdominal pain for 5 days. EXAM: CT ABDOMEN AND PELVIS WITH CONTRAST TECHNIQUE: Multidetector CT imaging of the abdomen and pelvis was performed using the standard protocol following bolus administration of intravenous contrast. CONTRAST:  179mL ISOVUE-300 IOPAMIDOL (ISOVUE-300) INJECTION 61% COMPARISON:  12/04/2013 FINDINGS: Lower chest: No acute abnormality. Hepatobiliary: No focal liver abnormality is seen. No gallstones, gallbladder wall thickening, or biliary dilatation. Pancreas: No inflammation or duct dilatation identified. Cystic structure within the tail of pancreas measures 1.7 cm, image 30 of series 2. On the previous exam this measured 1.0 cm. Spleen: Normal in size without focal abnormality. Adrenals/Urinary  Tract: Adrenal glands are unremarkable. Kidneys are normal, without renal calculi, focal lesion, or hydronephrosis. Bladder is unremarkable. Stomach/Bowel: The stomach is normal. There is no pathologic dilatation of the large or small bowel loops. The appendix is visualized and appears normal. Vascular/Lymphatic: Normal appearance of the abdominal aorta. There are no enlarged lymph nodes within the upper abdomen. There is no pelvic or inguinal adenopathy. Reproductive: Uterus and bilateral adnexa are unremarkable. IUD is identified within the endometrial cavity. Other: No ascites identified. Musculoskeletal: No acute or significant osseous findings. Previous bilateral hip arthroplasty. IMPRESSION: 1. No acute findings within the abdomen or pelvis. 2. The appendix is visualized and appears normal. 3. There is a enlarging cystic structure within the tail of pancreas. In a patient who has a history of pancreatitis this may represent a pseudocyst. Enlarging cystic neoplasm of the pancreas may have a similar appearance. Recommend further evaluation with nonemergent followup imaging in 6 months with pancreas protocol MRI or CT is  recommended. Electronically Signed   By: Kerby Moors M.D.   On: 06/17/2016 20:57    Assessment and Plan:   April Chaney is a 46 y.o. y/o female has been referred for pancreatitis. This appears to be the third episode of pancreatitis. In the past she has had a pancreatic cyst/pseudocyst which she underwent EUS+FNA. The aspirate was reported as insufficient to report . Her recent bout of pancreatitis was in 05/2016 and CT scan shows a cyst in the tail. Am unsure if this was due to prior episodes of pancreatitis or if the cyst precipitated the pancreatitis. It is very likely that her episodes of pancreatitis were secondary to alcohol . No evidence to suggest gall stone pancreatitis.   Plan  1. Stop all alcohol and stop cigarette smoking . 2. Check IGG4 levels, lipids, celiac serology . 3. MRCP to evaluate pancreatic cyst and may need EUS+FNA   Follow up in 6 weeks.   Dr Jonathon Bellows MD

## 2016-07-10 ENCOUNTER — Ambulatory Visit: Payer: BLUE CROSS/BLUE SHIELD

## 2016-07-21 ENCOUNTER — Ambulatory Visit: Payer: Managed Care, Other (non HMO)

## 2016-08-03 ENCOUNTER — Encounter: Payer: Self-pay | Admitting: Internal Medicine

## 2016-08-03 ENCOUNTER — Inpatient Hospital Stay
Admission: EM | Admit: 2016-08-03 | Discharge: 2016-08-06 | DRG: 440 | Disposition: A | Discharge status: Home / Self Care | Payer: Managed Care, Other (non HMO) | Attending: Specialist | Admitting: Specialist

## 2016-08-03 DIAGNOSIS — K861 Other chronic pancreatitis: Secondary | ICD-10-CM | POA: Diagnosis present

## 2016-08-03 DIAGNOSIS — Z7984 Long term (current) use of oral hypoglycemic drugs: Secondary | ICD-10-CM

## 2016-08-03 DIAGNOSIS — Z6829 Body mass index (BMI) 29.0-29.9, adult: Secondary | ICD-10-CM

## 2016-08-03 DIAGNOSIS — E1140 Type 2 diabetes mellitus with diabetic neuropathy, unspecified: Secondary | ICD-10-CM | POA: Diagnosis present

## 2016-08-03 DIAGNOSIS — E785 Hyperlipidemia, unspecified: Secondary | ICD-10-CM | POA: Diagnosis present

## 2016-08-03 DIAGNOSIS — K219 Gastro-esophageal reflux disease without esophagitis: Secondary | ICD-10-CM | POA: Diagnosis present

## 2016-08-03 DIAGNOSIS — Z23 Encounter for immunization: Secondary | ICD-10-CM

## 2016-08-03 DIAGNOSIS — Z96643 Presence of artificial hip joint, bilateral: Secondary | ICD-10-CM | POA: Diagnosis not present

## 2016-08-03 DIAGNOSIS — F17210 Nicotine dependence, cigarettes, uncomplicated: Secondary | ICD-10-CM | POA: Diagnosis present

## 2016-08-03 DIAGNOSIS — E876 Hypokalemia: Secondary | ICD-10-CM | POA: Diagnosis not present

## 2016-08-03 DIAGNOSIS — Z79899 Other long term (current) drug therapy: Secondary | ICD-10-CM | POA: Diagnosis not present

## 2016-08-03 DIAGNOSIS — M069 Rheumatoid arthritis, unspecified: Secondary | ICD-10-CM | POA: Diagnosis present

## 2016-08-03 DIAGNOSIS — R109 Unspecified abdominal pain: Secondary | ICD-10-CM | POA: Diagnosis present

## 2016-08-03 DIAGNOSIS — I10 Essential (primary) hypertension: Secondary | ICD-10-CM | POA: Diagnosis present

## 2016-08-03 DIAGNOSIS — E669 Obesity, unspecified: Secondary | ICD-10-CM | POA: Diagnosis present

## 2016-08-03 DIAGNOSIS — Z8711 Personal history of peptic ulcer disease: Secondary | ICD-10-CM | POA: Diagnosis not present

## 2016-08-03 DIAGNOSIS — R1013 Epigastric pain: Secondary | ICD-10-CM

## 2016-08-03 DIAGNOSIS — E119 Type 2 diabetes mellitus without complications: Secondary | ICD-10-CM

## 2016-08-03 DIAGNOSIS — K852 Alcohol induced acute pancreatitis without necrosis or infection: Secondary | ICD-10-CM | POA: Diagnosis present

## 2016-08-03 DIAGNOSIS — F1020 Alcohol dependence, uncomplicated: Secondary | ICD-10-CM | POA: Diagnosis present

## 2016-08-03 HISTORY — DX: Type 2 diabetes mellitus without complications: E11.9

## 2016-08-03 HISTORY — DX: Essential (primary) hypertension: I10

## 2016-08-03 LAB — COMPREHENSIVE METABOLIC PANEL
ALT: 14 U/L (ref 14–54)
AST: 32 U/L (ref 15–41)
Albumin: 4.6 g/dL (ref 3.5–5.0)
Alkaline Phosphatase: 74 U/L (ref 38–126)
Anion gap: 13 (ref 5–15)
BUN: 11 mg/dL (ref 6–20)
CO2: 20 mmol/L — ABNORMAL LOW (ref 22–32)
Calcium: 8.8 mg/dL — ABNORMAL LOW (ref 8.9–10.3)
Chloride: 104 mmol/L (ref 101–111)
Creatinine, Ser: 0.59 mg/dL (ref 0.44–1.00)
GFR calc Af Amer: >60 mL/min (ref >60)
GFR calc non Af Amer: >60 mL/min (ref >60)
Glucose, Bld: 120 mg/dL — ABNORMAL HIGH (ref 65–99)
Potassium: 4.1 mmol/L (ref 3.5–5.1)
Sodium: 137 mmol/L (ref 135–145)
Total Bilirubin: 0.6 mg/dL (ref 0.3–1.2)
Total Protein: 8.8 g/dL — ABNORMAL HIGH (ref 6.5–8.1)

## 2016-08-03 LAB — CBC
HCT: 43.7 % (ref 35.0–47.0)
HEMOGLOBIN: 14.8 g/dL (ref 12.0–16.0)
MCH: 29.4 pg (ref 26.0–34.0)
MCHC: 33.8 g/dL (ref 32.0–36.0)
MCV: 86.8 fL (ref 80.0–100.0)
Platelets: 226 10*3/uL (ref 150–440)
RBC: 5.03 MIL/uL (ref 3.80–5.20)
RDW: 14.1 % (ref 11.5–14.5)
WBC: 10.7 10*3/uL (ref 3.6–11.0)

## 2016-08-03 LAB — LIPASE, BLOOD: Lipase: 659 U/L — ABNORMAL HIGH (ref 11–51)

## 2016-08-03 LAB — GLUCOSE, CAPILLARY: Glucose-Capillary: 144 mg/dL — ABNORMAL HIGH (ref 65–99)

## 2016-08-03 LAB — ETHANOL

## 2016-08-03 MED ORDER — HYDROCODONE-ACETAMINOPHEN 5-325 MG PO TABS
1.0000 | ORAL_TABLET | ORAL | Status: DC | PRN
  Administered 2016-08-03 – 2016-08-06 (×6): 2 via ORAL
  Filled 2016-08-03 (×6): qty 2

## 2016-08-03 MED ORDER — FENTANYL CITRATE (PF) 100 MCG/2ML IJ SOLN
50.0000 ug | INTRAMUSCULAR | Status: DC | PRN
  Administered 2016-08-03: 50 ug via INTRAVENOUS
  Filled 2016-08-03: qty 2

## 2016-08-03 MED ORDER — HYDROMORPHONE HCL 1 MG/ML IJ SOLN
1.0000 mg | INTRAMUSCULAR | Status: DC | PRN
  Administered 2016-08-03 – 2016-08-05 (×11): 1 mg via INTRAVENOUS
  Filled 2016-08-03 (×11): qty 1

## 2016-08-03 MED ORDER — PNEUMOCOCCAL VAC POLYVALENT 25 MCG/0.5ML IJ INJ
0.5000 mL | INJECTION | INTRAMUSCULAR | Status: AC
  Administered 2016-08-04: 0.5 mL via INTRAMUSCULAR
  Filled 2016-08-03: qty 0.5

## 2016-08-03 MED ORDER — HYDROMORPHONE HCL 1 MG/ML PO LIQD: 1.0000 mg | Freq: Once | ORAL | Status: DC

## 2016-08-03 MED ORDER — SODIUM CHLORIDE 0.9 % IV SOLN
INTRAVENOUS | Status: DC
  Administered 2016-08-03 – 2016-08-05 (×5): via INTRAVENOUS

## 2016-08-03 MED ORDER — ACETAMINOPHEN 325 MG PO TABS
650.0000 mg | ORAL_TABLET | Freq: Four times a day (QID) | ORAL | Status: DC | PRN
  Administered 2016-08-04 – 2016-08-05 (×2): 650 mg via ORAL
  Filled 2016-08-03 (×2): qty 2

## 2016-08-03 MED ORDER — INFLUENZA VAC SPLIT QUAD 0.5 ML IM SUSY
0.5000 mL | PREFILLED_SYRINGE | INTRAMUSCULAR | Status: AC
  Administered 2016-08-04: 0.5 mL via INTRAMUSCULAR
  Filled 2016-08-03: qty 0.5

## 2016-08-03 MED ORDER — ONDANSETRON HCL 4 MG/2ML IJ SOLN: 4.0000 mg | Freq: Four times a day (QID) | INTRAMUSCULAR | Status: DC | PRN

## 2016-08-03 MED ORDER — SODIUM CHLORIDE 0.9 % IV BOLUS (SEPSIS)
1000.0000 mL | Freq: Once | INTRAVENOUS | Status: AC
  Administered 2016-08-03: 1000 mL via INTRAVENOUS

## 2016-08-03 MED ORDER — ONDANSETRON HCL 4 MG/2ML IJ SOLN
4.0000 mg | Freq: Once | INTRAMUSCULAR | Status: AC | PRN
  Administered 2016-08-03: 4 mg via INTRAVENOUS
  Filled 2016-08-03: qty 2

## 2016-08-03 MED ORDER — ENOXAPARIN SODIUM 40 MG/0.4ML ~~LOC~~ SOLN
40.0000 mg | SUBCUTANEOUS | Status: DC
  Administered 2016-08-03 – 2016-08-05 (×3): 40 mg via SUBCUTANEOUS
  Filled 2016-08-03 (×3): qty 0.4

## 2016-08-03 MED ORDER — INSULIN ASPART 100 UNIT/ML ~~LOC~~ SOLN
0.0000 [IU] | Freq: Three times a day (TID) | SUBCUTANEOUS | Status: DC
  Administered 2016-08-04 – 2016-08-05 (×3): 1 [IU] via SUBCUTANEOUS
  Filled 2016-08-03 (×3): qty 1

## 2016-08-03 MED ORDER — ONDANSETRON HCL 4 MG PO TABS: 4.0000 mg | ORAL_TABLET | Freq: Four times a day (QID) | ORAL | Status: DC | PRN

## 2016-08-03 MED ORDER — HYDROMORPHONE HCL 1 MG/ML IJ SOLN
1.0000 mg | Freq: Once | INTRAMUSCULAR | Status: AC
  Administered 2016-08-03: 1 mg via INTRAVENOUS
  Filled 2016-08-03: qty 1

## 2016-08-03 MED ORDER — ACETAMINOPHEN 650 MG RE SUPP: 650.0000 mg | Freq: Four times a day (QID) | RECTAL | Status: DC | PRN

## 2016-08-03 NOTE — ED Provider Notes (Signed)
[facility] Emergency Department Provider Note  ____________________________________________  Time seen: Approximately 7:28 PM  I have reviewed the triage vital signs and the nursing notes.   HISTORY  Chief Complaint Abdominal Pain    HPI April Chaney is a 46 y.o. female who complains of epigastric pain radiating to her back associated with vomiting. She also has chills. Reports she has not been able to eat or drink for 24 hours since this started. She reports that the last 2 days she has been eating french fries which she thinks is the cause of her symptoms. Reviewing the electronic medical record shows that she has been evaluated by GI for alcoholic pancreatitis. She is noted to have a cystic structure at the tail of the pancreas, pseudocyst versus neoplasm, and GI has most recently recommended she obtain an MRCP which she has not yet done. Pain is severe. No alleviating factors. Sharp.  She does admit to having 2 drinks 2 days ago and one shot of liquor yesterday.     Past Medical History:  Diagnosis Date  . Colon polyps   . Diabetes mellitus without complication ([place])   . GERD (gastroesophageal reflux disease)   . Pancreatitis   . Presence of artificial hip    has to take antibiotics prior to any procedure-dental, etc  . Ulcer [facility])      Patient Active Problem List   Diagnosis Date Noted  . Encounter for health maintenance examination 10/22/2015  . Rheumatoid arthritis involving multiple sites with positive rheumatoid factor ([place]) 10/22/2015  . BMI 35.0-35.9,adult 09/23/2015  . Elevated rheumatoid factor 09/23/2015  . History of adenomatous polyp of colon 09/23/2015  . Alopecia 08/18/2015  . Benign essential hypertension 08/18/2015  . Benign neoplasm of colon 08/18/2015  . Epigastric discomfort 08/18/2015  . GERD without esophagitis 08/18/2015  . Heartburn 08/18/2015  . High risk medication use 08/18/2015  . Hyperlipidemia 08/18/2015   . Hyponatremia 08/18/2015  . Infective otitis externa of left ear 08/18/2015  . Insomnia 08/18/2015  . Neuropathy of both feet 08/18/2015  . Nicotine dependence 08/18/2015  . Oligomenorrhea 08/18/2015  . Obesity 08/18/2015  . Gastro-esophageal reflux disease with esophagitis 08/18/2015  . Stress incontinence in female 08/18/2015  . Type 2 diabetes, uncontrolled, with neuropathy ([place]) 08/18/2015  . Uncontrolled diabetes mellitus with complications ([place]) 99991111  . Vitamin D deficiency 08/18/2015  . Wheezing 08/18/2015  . Colon polyps 01/06/2014  . Pancreatic cyst 01/06/2014  . Pancreatitis 01/06/2014     Past Surgical History:  Procedure Laterality Date  . JOINT REPLACEMENT     hip replacement  . STRABISMUS SURGERY  12/12/2011   Procedure: REPAIR STRABISMUS;  Surgeon: Derry Skill, MD;  Location: [place];  Service: Ophthalmology;  Laterality: Right;  . TOTAL HIP ARTHROPLASTY     bilat     Prior to Admission medications   Medication Sig Start Date End Date Taking? Authorizing Provider  amoxicillin (AMOXIL) 500 MG capsule Take 2,000 mg by mouth once as needed. 1 hour before dental appointment or lab work    Historical Provider, MD  buPROPion (WELLBUTRIN XL) 300 MG 24 hr tablet Take 300 mg by mouth. 01/03/16   Historical Provider, MD  levonorgestrel (MIRENA) 20 MCG/24HR IUD by Intrauterine route.    Historical Provider, MD  metformin (FORTAMET) 500 MG (OSM) 24 hr tablet Take 500 mg by mouth. 11/08/15   Historical Provider, MD  Multiple Vitamin (MULTIVITAMIN WITH MINERALS) TABS Take 1 tablet by mouth daily.  Historical Provider, MD  Omega-3 Fatty Acids (FISH OIL PO) Take 1 capsule by mouth daily.    Historical Provider, MD  omeprazole (PRILOSEC) 20 MG capsule TK 1 C PO D 06/15/16   Historical Provider, MD  Omeprazole Magnesium ([employer] OMEPRAZOLE) 20.6 (20 Base) MG CPDR Take 1 capsule daily    Historical Provider, MD  ondansetron (ZOFRAN) 4 MG tablet Take 1  tablet (4 mg total) by mouth daily as needed for nausea or vomiting. 06/17/16   Earleen Newport, MD  Prenatal Multivit-Min-Fe-FA (PRENATAL/IRON PO) Take 19 mLs by mouth.    Historical Provider, MD  Specialty Vitamins Products (CENTRUM PERFORMANCE PO)     Historical Provider, MD     Allergies Patient has no known allergies.   Family History  Problem Relation Age of Onset  . Diabetes Mother   . Diabetes Father   . Hypertension Father     Social History Social History  Substance Use Topics  . Smoking status: Current Every Day Smoker    Packs/day: 0.50  . Smokeless tobacco: Never Used  . Alcohol use Yes    Review of Systems  Constitutional:   No fever or chills.  ENT:   No sore throat. No rhinorrhea. Cardiovascular:   No chest pain. Respiratory:   No dyspnea or cough. Gastrointestinal:   Positive as above epigastric pain with vomiting.  Genitourinary:   Negative for dysuria or difficulty urinating. Musculoskeletal:   Negative for focal pain or swelling Neurological:   Negative for headaches 10-point ROS otherwise negative.  ____________________________________________   PHYSICAL EXAM:  VITAL SIGNS: ED Triage Vitals  Enc Vitals Group     BP 08/03/16 1626 (!) 130/106     Pulse Rate 08/03/16 1626 78     Resp 08/03/16 1626 20     Temp 08/03/16 1626 98.6 F (37 C)     Temp Source 08/03/16 1626 Oral     SpO2 08/03/16 1626 98 %     Weight 08/03/16 1630 150 lb (68 kg)     Height 08/03/16 1630 5' (1.524 m)     Head Circumference --      Peak Flow --      Pain Score 08/03/16 1630 9     Pain Loc --      Pain Edu? --      Excl. in [place]? --     Vital signs reviewed, nursing assessments reviewed.   Constitutional:   Alert and oriented. Ill-appearing. Eyes:   No scleral icterus. No conjunctival pallor. PERRL. EOMI.  No nystagmus. ENT   Head:   Normocephalic and atraumatic.   Nose:   No congestion/rhinnorhea. No septal hematoma   Mouth/Throat:   MMM, no  pharyngeal erythema. No peritonsillar mass.    Neck:   No stridor. No SubQ emphysema. No meningismus. Hematological/Lymphatic/Immunilogical:   No cervical lymphadenopathy. Cardiovascular:   RRR. Symmetric bilateral radial and DP pulses.  No murmurs.  Respiratory:   Normal respiratory effort without tachypnea nor retractions. Breath sounds are clear and equal bilaterally. No wheezes/rales/rhonchi. Gastrointestinal:   Soft with epigastric tenderness. Non distended. There is no CVA tenderness.  No rebound, rigidity, or guarding. Genitourinary:   deferred Musculoskeletal:   Nontender with normal range of motion in all extremities. No joint effusions.  No lower extremity tenderness.  No edema. Neurologic:   Normal speech and language.  CN 2-10 normal. Motor grossly intact. No gross focal neurologic deficits are appreciated.  Skin:    Skin is warm, dry and intact.  No rash noted.  No petechiae, purpura, or bullae.  ____________________________________________    LABS (pertinent positives/negatives) (all labs ordered are listed, but only abnormal results are displayed) Labs Reviewed  LIPASE, BLOOD - Abnormal; Notable for the following:       Result Value   Lipase 659 (*)    All other components within normal limits  COMPREHENSIVE METABOLIC PANEL - Abnormal; Notable for the following:    CO2 20 (*)    Glucose, Bld 120 (*)    Calcium 8.8 (*)    Total Protein 8.8 (*)    All other components within normal limits  CBC  URINALYSIS, COMPLETE (UACMP) WITH MICROSCOPIC   ____________________________________________   EKG  Interpreted by me  Date: 08/03/2016  Rate: 72  Rhythm: normal sinus rhythm  QRS Axis: normal  Intervals: normal  ST/T Wave abnormalities: normal  Conduction Disutrbances: none  Narrative Interpretation: unremarkable      ____________________________________________     RADIOLOGY    ____________________________________________   PROCEDURES Procedures  ____________________________________________   INITIAL IMPRESSION / ASSESSMENT AND PLAN / ED COURSE  Pertinent labs & imaging results that were available during my care of the patient were reviewed by me and considered in my medical decision making (see chart for details).  Patient presents with epigastric pain and vomiting. Symptoms and exam consistent with recurrent pancreatitis. Lipase is 659 today. GI note from one month ago is clear that her ankle otitis is most likely due to alcohol abuse and not biliary. Patient's workup is currently pending an MRCP. We'll continue giving the patient IV fluids and analgesics, admit to hospitalist.       ____________________________________________   FINAL CLINICAL IMPRESSION(S) / ED DIAGNOSES  Final diagnoses:  Epigastric pain  Alcohol-induced acute pancreatitis, unspecified complication status      New Prescriptions   No medications on file     Portions of this note were generated with dragon dictation software. Dictation errors may occur despite best attempts at proofreading.    Carrie Mew, MD 08/03/16 [phone]

## 2016-08-03 NOTE — H&P (Signed)
[place] [facility] [place] NAME: April Chaney    MR#:  [id number]  DATE OF BIRTH:  Jan 14, 1971  DATE OF ADMISSION:  08/03/2016  PRIMARY CARE PHYSICIAN: Garlan Fillers, MD   REQUESTING/REFERRING PHYSICIAN: Joni Fears MD  CHIEF COMPLAINT:   Chief Complaint  Patient presents with  . Abdominal Pain    HISTORY OF PRESENT ILLNESS: April Chaney  is a 46 y.o. female with a known history of Diabetes type 2, GERD, recurrent pancreatitis who is presenting to the hospital with complaining of abdominal pain severe since yesterday. She reports that this an epigastric region with radiation to the back. She had similar episode in December and was subsequently was seen by GI as out patient, patient has a history of having elevated lipase in the past. GI recommended that she get a MRI of her abdomen which patient did not make it to the appointment. Now she is demanding that the MRI be done here. She has a history of having a pancreatic cyst with a EUS study done at outlying facility. Patient also complains of nausea vomiting but no not no diarrhea. Patient has a history of drinking heavily per GIs note from the past she states that now she is cut down to one drink a day. PAST MEDICAL HISTORY:   Past Medical History:  Diagnosis Date  . Colon polyps   . Diabetes mellitus without complication ([place])   . DM (diabetes mellitus) ([place])   . GERD (gastroesophageal reflux disease)   . Hypertension   . Pancreatitis   . Presence of artificial hip    has to take antibiotics prior to any procedure-dental, etc  . Ulcer ([employer])     PAST SURGICAL HISTORY: Past Surgical History:  Procedure Laterality Date  . JOINT REPLACEMENT     hip replacement  . STRABISMUS SURGERY  12/12/2011   Procedure: REPAIR STRABISMUS;  Surgeon: Derry Skill, MD;  Location: [place];  Service: Ophthalmology;  Laterality: Right;  . TOTAL HIP ARTHROPLASTY     bilat    SOCIAL  HISTORY:  Social History  Substance Use Topics  . Smoking status: Current Every Day Smoker    Packs/day: 0.50  . Smokeless tobacco: Never Used  . Alcohol use Yes    FAMILY HISTORY:  Family History  Problem Relation Age of Onset  . Diabetes Mother   . Diabetes Father   . Hypertension Father     DRUG ALLERGIES: No Known Allergies  REVIEW OF SYSTEMS:   CONSTITUTIONAL: No fever, fatigue or weakness.  EYES: No blurred or double vision.  EARS, NOSE, AND THROAT: No tinnitus or ear pain.  RESPIRATORY: No cough, shortness of breath, wheezing or hemoptysis.  CARDIOVASCULAR: No chest pain, orthopnea, edema.  GASTROINTESTINAL: Positive nausea, positive vomiting, no diarrhea or positive abdominal pain.  GENITOURINARY: No dysuria, hematuria.  ENDOCRINE: No polyuria, nocturia,  HEMATOLOGY: No anemia, easy bruising or bleeding SKIN: No rash or lesion. MUSCULOSKELETAL: No joint pain or arthritis.   NEUROLOGIC: No tingling, numbness, weakness.  PSYCHIATRY: No anxiety or depression.   MEDICATIONS AT HOME:  Prior to Admission medications   Medication Sig Start Date End Date Taking? Authorizing Provider  levonorgestrel (MIRENA) 20 MCG/24HR IUD by Intrauterine route.   Yes Historical Provider, MD  metformin (FORTAMET) 500 MG (OSM) 24 hr tablet Take 500 mg by mouth 2 (two) times daily with a meal.  11/08/15  Yes Historical Provider, MD  Multiple Vitamin (MULTIVITAMIN WITH MINERALS) TABS Take 1  tablet by mouth daily.   Yes Historical Provider, MD  Omega-3 Fatty Acids (FISH OIL PO) Take 1 capsule by mouth daily.   Yes Historical Provider, MD  Tofacitinib Citrate (XELJANZ PO) Take 1 tablet by mouth 2 (two) times daily.   Yes Historical Provider, MD  ondansetron (ZOFRAN) 4 MG tablet Take 1 tablet (4 mg total) by mouth daily as needed for nausea or vomiting. 06/17/16   Earleen Newport, MD      PHYSICAL EXAMINATION:   VITAL SIGNS: Blood pressure (!) 130/106, pulse 78, temperature 98.6 F (37  C), temperature source Oral, resp. rate 20, height 5' (1.524 m), weight 150 lb (68 kg), SpO2 98 %.  GENERAL:  46 y.o.-year-old patient lying in the bed with no acute distress.  EYES: Pupils equal, round, reactive to light and accommodation. No scleral icterus. Extraocular muscles intact.  HEENT: Head atraumatic, normocephalic. Oropharynx and nasopharynx clear.  NECK:  Supple, no jugular venous distention. No thyroid enlargement, no tenderness.  LUNGS: Normal breath sounds bilaterally, no wheezing, rales,rhonchi or crepitation. No use of accessory muscles of respiration.  CARDIOVASCULAR: S1, S2 normal. No murmurs, rubs, or gallops.  ABDOMEN: Soft, Epigastric tenderness, nondistended. Bowel sounds present. No organomegaly or mass.  EXTREMITIES: No pedal edema, cyanosis, or clubbing.  NEUROLOGIC: Cranial nerves II through XII are intact. Muscle strength 5/5 in all extremities. Sensation intact. Gait not checked.  PSYCHIATRIC: The patient is alert and oriented x 3.  SKIN: No obvious rash, lesion, or ulcer.   LABORATORY PANEL:   CBC  Recent Labs Lab 08/03/16 1730  WBC 10.7  HGB 14.8  HCT 43.7  PLT 226  MCV 86.8  MCH 29.4  MCHC 33.8  RDW 14.1   ------------------------------------------------------------------------------------------------------------------  Chemistries   Recent Labs Lab 08/03/16 1730  NA 137  K 4.1  CL 104  CO2 20*  GLUCOSE 120*  BUN 11  CREATININE 0.59  CALCIUM 8.8*  AST 32  ALT 14  ALKPHOS 74  BILITOT 0.6   ------------------------------------------------------------------------------------------------------------------ estimated creatinine clearance is 75.6 mL/min (by C-G formula based on SCr of 0.59 mg/dL). ------------------------------------------------------------------------------------------------------------------ No results for input(s): TSH, T4TOTAL, T3FREE, THYROIDAB in the last 72 hours.  Invalid input(s): FREET3   Coagulation  profile No results for input(s): INR, PROTIME in the last 168 hours. ------------------------------------------------------------------------------------------------------------------- No results for input(s): DDIMER in the last 72 hours. -------------------------------------------------------------------------------------------------------------------  Cardiac Enzymes No results for input(s): CKMB, TROPONINI, MYOGLOBIN in the last 168 hours.  Invalid input(s): CK ------------------------------------------------------------------------------------------------------------------ Invalid input(s): POCBNP  ---------------------------------------------------------------------------------------------------------------  Urinalysis    Component Value Date/Time   COLORURINE YELLOW (A) 06/17/2016 1710   APPEARANCEUR CLEAR (A) 06/17/2016 1710   LABSPEC 1.017 06/17/2016 1710   PHURINE 5.0 06/17/2016 1710   GLUCOSEU NEGATIVE 06/17/2016 1710   HGBUR NEGATIVE 06/17/2016 1710   BILIRUBINUR NEGATIVE 06/17/2016 1710   KETONESUR NEGATIVE 06/17/2016 1710   PROTEINUR NEGATIVE 06/17/2016 1710   UROBILINOGEN 0.2 12/04/2013 0759   NITRITE NEGATIVE 06/17/2016 1710   LEUKOCYTESUR TRACE (A) 06/17/2016 1710     RADIOLOGY: No results found.  EKG: Orders placed or performed during the hospital encounter of 08/03/16  . ED EKG  . ED EKG  . EKG 12-Lead  . EKG 12-Lead    IMPRESSION AND PLAN: Patient is 46 year old presenting with abdominal pain and was noted to have elevated lipase  1. Acute pancreatitis With a cyst in the past. At this point I will do an MRCP of her abdomen as scheduled previously Keep nothing by mouth Supportive care  IV hydration GI consult in the morning She's had a fasting lipid panel checked as well as IgG levels checked as outpatient  2. Diabetes type 2 Hold metformin Place on sliding scale insulin every 6 hours and she'll be nothing by mouth  3. Rheumatoid arthritis  her medications will be held for now  4. Miscellaneous Lovenox for DVT prophylaxis    All the records are reviewed and case discussed with ED provider. Management plans discussed with the patient, family and they are in agreement.  CODE STATUS: Code Status History    This patient does not have a recorded code status. Please follow your organizational policy for patients in this situation.       TOTAL TIME TAKING CARE OF THIS PATIENT: 45 minutes.    Dustin Flock M.D on 08/03/2016 at 7:52 PM  Between 7am to 6pm - Pager - [phone]  After 6pm go to [email or web address] - password [email or web address] [facility]  Office  [phone]  CC: Primary care physician; Garlan Fillers, MD

## 2016-08-03 NOTE — ED Triage Notes (Signed)
Pt reports upper abd pain that started today states pain radiates to back. Reports multiple episodes of vomiting denies diarrhea. C/o chills. States she was here last time for acute pancreatitis in Dec.

## 2016-08-03 NOTE — Progress Notes (Signed)
Called Dr. Jannifer Franklin regarding patient request for iv antibiotics.  Doctor stated that pancreatitis usually is not treated with antibiotics.  GI will be consulting with patient in the morning for further follow up.  Christene Slates  08/03/2016   10:31 PM

## 2016-08-04 ENCOUNTER — Inpatient Hospital Stay: Payer: Managed Care, Other (non HMO)

## 2016-08-04 DIAGNOSIS — K852 Alcohol induced acute pancreatitis without necrosis or infection: Secondary | ICD-10-CM

## 2016-08-04 DIAGNOSIS — R1013 Epigastric pain: Secondary | ICD-10-CM

## 2016-08-04 LAB — BASIC METABOLIC PANEL
Anion gap: 6 (ref 5–15)
BUN: 11 mg/dL (ref 6–20)
CALCIUM: 7.6 mg/dL — AB (ref 8.9–10.3)
CO2: 24 mmol/L (ref 22–32)
CREATININE: 0.63 mg/dL (ref 0.44–1.00)
Chloride: 106 mmol/L (ref 101–111)
Glucose, Bld: 131 mg/dL — ABNORMAL HIGH (ref 65–99)
Potassium: 3.2 mmol/L — ABNORMAL LOW (ref 3.5–5.1)
Sodium: 136 mmol/L (ref 135–145)

## 2016-08-04 LAB — CBC
HEMATOCRIT: 36.8 % (ref 35.0–47.0)
Hemoglobin: 12.8 g/dL (ref 12.0–16.0)
MCH: 30.1 pg (ref 26.0–34.0)
MCHC: 34.7 g/dL (ref 32.0–36.0)
MCV: 86.7 fL (ref 80.0–100.0)
PLATELETS: 195 10*3/uL (ref 150–440)
RBC: 4.25 MIL/uL (ref 3.80–5.20)
RDW: 13.9 % (ref 11.5–14.5)
WBC: 10.1 10*3/uL (ref 3.6–11.0)

## 2016-08-04 LAB — GLUCOSE, CAPILLARY
GLUCOSE-CAPILLARY: 106 mg/dL — AB (ref 65–99)
Glucose-Capillary: 126 mg/dL — ABNORMAL HIGH (ref 65–99)
Glucose-Capillary: 137 mg/dL — ABNORMAL HIGH (ref 65–99)
Glucose-Capillary: 138 mg/dL — ABNORMAL HIGH (ref 65–99)

## 2016-08-04 LAB — LIPASE, BLOOD: Lipase: 363 U/L — ABNORMAL HIGH (ref 11–51)

## 2016-08-04 MED ORDER — LABETALOL HCL 5 MG/ML IV SOLN
5.0000 mg | INTRAVENOUS | Status: DC | PRN
Start: 2016-08-04 — End: 2016-08-06
  Administered 2016-08-04: 5 mg via INTRAVENOUS
  Filled 2016-08-04: qty 4

## 2016-08-04 MED ORDER — GADOBENATE DIMEGLUMINE 529 MG/ML IV SOLN
15.0000 mL | Freq: Once | INTRAVENOUS | Status: AC | PRN
  Administered 2016-08-04: 14 mL via INTRAVENOUS

## 2016-08-04 NOTE — Progress Notes (Signed)
Called Dr. Marcille Blanco regarding patient's elevated blood pressure.  Appropriate orders were placed.  April Chaney  08/04/2016  2:21 AM

## 2016-08-04 NOTE — Consult Note (Signed)
April Lame, MD [address] [place], [place] [address] Phone: [phone] Fax : [phone]  Consultation  Referring Provider:     Dr. Verdell Carmine Primary Care Physician:  Garlan Fillers, MD Primary Gastroenterologist:  Dr. Vicente Males         Reason for Consultation:     Pancreatitis  Date of Admission:  08/03/2016 Date of Consultation:  08/04/2016         HPI:   April Chaney is a 46 y.o. female who has a history of pancreatitis. The patient was admitted with abdominal pain and Recurrent pancreatitis.  The patient was seen by Dr. Vicente Males back in January after having a similar episode in December.  The patient was recommended to get an MRI as an outpatient but did not make it to the appointment.  The patient was admitted and demanded that she have an MRI as an inpatient.  The patient also has a history of pancreatic pseudocyst with a endoscopic ultrasound at another facility.  The patient has a history of significant alcohol abuse in the past but states she is now drinking less but has not stopped.  The patient's MRI showed her to have pancreatic cysts with inflammation of the pancreatic tail. I'm now being asked to see this patient with acute on chronic pancreatitis who has continued to drink despite being told to avoid alcohol use.  Past Medical History:  Diagnosis Date  . Colon polyps   . Diabetes mellitus without complication ([facility])   . DM (diabetes mellitus) ([facility])   . GERD (gastroesophageal reflux disease)   . Hypertension   . Pancreatitis   . Presence of artificial hip    has to take antibiotics prior to any procedure-dental, etc  . Ulcer [facility])     Past Surgical History:  Procedure Laterality Date  . JOINT REPLACEMENT     hip replacement  . STRABISMUS SURGERY  12/12/2011   Procedure: REPAIR STRABISMUS;  Surgeon: Derry Skill, MD;  Location: [facility];  Service: Ophthalmology;  Laterality: Right;  . TOTAL HIP ARTHROPLASTY     bilat    Prior  to Admission medications   Medication Sig Start Date End Date Taking? Authorizing Provider  levonorgestrel (MIRENA) 20 MCG/24HR IUD by Intrauterine route.   Yes Historical Provider, MD  metformin (FORTAMET) 500 MG (OSM) 24 hr tablet Take 500 mg by mouth 2 (two) times daily with a meal.  11/08/15  Yes Historical Provider, MD  Multiple Vitamin (MULTIVITAMIN WITH MINERALS) TABS Take 1 tablet by mouth daily.   Yes Historical Provider, MD  Omega-3 Fatty Acids (FISH OIL PO) Take 1 capsule by mouth daily.   Yes Historical Provider, MD  Tofacitinib Citrate (XELJANZ PO) Take 1 tablet by mouth 2 (two) times daily.   Yes Historical Provider, MD  ondansetron (ZOFRAN) 4 MG tablet Take 1 tablet (4 mg total) by mouth daily as needed for nausea or vomiting. 06/17/16   Earleen Newport, MD    Family History  Problem Relation Age of Onset  . Diabetes Mother   . Diabetes Father   . Hypertension Father      Social History  Substance Use Topics  . Smoking status: Current Every Day Smoker    Packs/day: 0.50  . Smokeless tobacco: Never Used  . Alcohol use Yes    Allergies as of 08/03/2016  . (No Known Allergies)    Review of Systems:    All systems reviewed and negative except where noted in HPI.  Physical Exam:  Vital signs in last 24 hours: Temp:  [97.9 F (36.6 C)-98.7 F (37.1 C)] 98.7 F (37.1 C) (02/12 1216) Pulse Rate:  [83-100] 95 (02/12 1216) Resp:  [18-22] 18 (02/12 1216) BP: (150-168)/(83-99) 150/83 (02/12 1216) SpO2:  [94 %-100 %] 94 % (02/12 1216) Last BM Date: 08/02/16 General:   Pleasant, cooperative in NAD Head:  Normocephalic and atraumatic. Eyes:   No icterus.   Conjunctiva pink. PERRLA. Ears:  Normal auditory acuity. Neck:  Supple; no masses or thyroidomegaly Lungs: Respirations even and unlabored. Lungs clear to auscultation bilaterally.   No wheezes, crackles, or rhonchi.  Heart:  Regular rate and rhythm;  Without murmur, clicks, rubs or gallops Abdomen:  Soft,  nondistended, Diffusely tender. Normal bowel sounds. No appreciable masses or hepatomegaly.  No rebound or guarding.  Rectal:  Not performed. Msk:  Symmetrical without gross Deformities  Extremities:  Without edema, cyanosis or clubbing. Neurologic:  Alert and oriented x3;  grossly normal neurologically. Skin:  Intact without significant lesions or rashes. Cervical Nodes:  No significant cervical adenopathy. Psych:  Alert and cooperative. Normal affect.  LAB RESULTS:  Recent Labs  08/03/16 1730 08/04/16 0440  WBC 10.7 10.1  HGB 14.8 12.8  HCT 43.7 36.8  PLT 226 195   BMET  Recent Labs  08/03/16 1730 08/04/16 0440  NA 137 136  K 4.1 3.2*  CL 104 106  CO2 20* 24  GLUCOSE 120* 131*  BUN 11 11  CREATININE 0.59 0.63  CALCIUM 8.8* 7.6*   LFT  Recent Labs  08/03/16 1730  PROT 8.8*  ALBUMIN 4.6  AST 32  ALT 14  ALKPHOS 74  BILITOT 0.6   PT/INR No results for input(s): LABPROT, INR in the last 72 hours.  STUDIES: Mr 3d Recon At Scanner  Result Date: 08/04/2016 CLINICAL DATA:  Elevated lipase levels. Epigastric pain. History pancreatic cystic lesion. EXAM: MRI ABDOMEN WITHOUT AND WITH CONTRAST (INCLUDING MRCP) TECHNIQUE: Multiplanar multisequence MR imaging of the abdomen was performed both before and after the administration of intravenous contrast. Heavily T2-weighted images of the biliary and pancreatic ducts were obtained, and three-dimensional MRCP images were rendered by post processing. CONTRAST:  70mL MULTIHANCE GADOBENATE DIMEGLUMINE 529 MG/ML IV SOLN COMPARISON:  06/17/2016 FINDINGS: Lower chest: Trace left pleural effusion. Hepatobiliary: Unremarkable Pancreas: Nonenhancing unilocular 1.9 by 1.5 cm cystic lesion at the junction of the pancreatic body and tail, uncertain activity to the dorsal pancreatic duct port skirts the upper margin of this lesion. By my measurements this lesion measured 2.0 by 1.4 cm on 12/04/2013, but was not visible on 09/15/2007. No  dilatation of the dorsal pancreatic duct is identified, however, the pancreatic tail distal to this cystic lesion appears acutely inflamed, with associated abnormal edema signal and expansion of the pancreatic parenchyma as well as extensive surrounding edema tracking along tissue planes. Restricted diffusion in knee pancreatic tail medial to this lesion but not in the rest of the pancreas. There is mild hypoenhancement along the tip of the tail the pancreas for example image 30/17 with a 1.2 cm poorly enhancing region in the pancreatic tail which may represent incipient pseudocyst formation. I find this much less likely to represent early pancreatic necrosis. Spleen:  Unremarkable Adrenals/Urinary Tract:  Unremarkable Stomach/Bowel: Unremarkable Vascular/Lymphatic:  Unremarkable Other:  No supplemental non-categorized findings. Musculoskeletal: Unremarkable IMPRESSION: 1. Unilocular nonenhancing 1.9 by 1.5 cm cystic lesion at the junction of the pancreatic body and tail. The pancreatic tail distal to this lesion appear significantly inflamed,  with accentuated diffusion weighted signal, mild expansion, and mild hypoenhancement with surrounding inflammatory edema favoring acute pancreatitis. The pancreas proximal to this cystic lesion, along the body and head, is not inflamed. I am uncertain about direct continuity of this cystic lesion with the dorsal pancreatic duct, the ducts skirts the margin of the lesion. It is possible that mass effect from the cystic lesion is causing some relative obstruction of the duct in turn leading to focal pancreatitis, but the dorsal pancreatic duct is not overtly dilated. The cystic lesion could represent a small pseudocyst or intraductal papillary mucinous neoplasm. Surveillance imaging likely warranted (annual surveillance). 2. 1.2 cm poorly enhancing region along the pancreatic tail may represent incipient pseudocyst formation, I find this much less likely to represent pancreatic  necrosis. If the patient is fulminantly ill to such a degree as to potentially suggest pancreatic necrosis then close surveillance would be recommended. 3. Trace left pleural effusion. Considerable edema stranding along the left retroperitoneum. Electronically Signed   By: Van Clines M.D.   On: 08/04/2016 12:00   Mr Abdomen Mrcp W Wo Contast  Result Date: 08/04/2016 CLINICAL DATA:  Elevated lipase levels. Epigastric pain. History pancreatic cystic lesion. EXAM: MRI ABDOMEN WITHOUT AND WITH CONTRAST (INCLUDING MRCP) TECHNIQUE: Multiplanar multisequence MR imaging of the abdomen was performed both before and after the administration of intravenous contrast. Heavily T2-weighted images of the biliary and pancreatic ducts were obtained, and three-dimensional MRCP images were rendered by post processing. CONTRAST:  59mL MULTIHANCE GADOBENATE DIMEGLUMINE 529 MG/ML IV SOLN COMPARISON:  06/17/2016 FINDINGS: Lower chest: Trace left pleural effusion. Hepatobiliary: Unremarkable Pancreas: Nonenhancing unilocular 1.9 by 1.5 cm cystic lesion at the junction of the pancreatic body and tail, uncertain activity to the dorsal pancreatic duct port skirts the upper margin of this lesion. By my measurements this lesion measured 2.0 by 1.4 cm on 12/04/2013, but was not visible on 09/15/2007. No dilatation of the dorsal pancreatic duct is identified, however, the pancreatic tail distal to this cystic lesion appears acutely inflamed, with associated abnormal edema signal and expansion of the pancreatic parenchyma as well as extensive surrounding edema tracking along tissue planes. Restricted diffusion in knee pancreatic tail medial to this lesion but not in the rest of the pancreas. There is mild hypoenhancement along the tip of the tail the pancreas for example image 30/17 with a 1.2 cm poorly enhancing region in the pancreatic tail which may represent incipient pseudocyst formation. I find this much less likely to represent  early pancreatic necrosis. Spleen:  Unremarkable Adrenals/Urinary Tract:  Unremarkable Stomach/Bowel: Unremarkable Vascular/Lymphatic:  Unremarkable Other:  No supplemental non-categorized findings. Musculoskeletal: Unremarkable IMPRESSION: 1. Unilocular nonenhancing 1.9 by 1.5 cm cystic lesion at the junction of the pancreatic body and tail. The pancreatic tail distal to this lesion appear significantly inflamed, with accentuated diffusion weighted signal, mild expansion, and mild hypoenhancement with surrounding inflammatory edema favoring acute pancreatitis. The pancreas proximal to this cystic lesion, along the body and head, is not inflamed. I am uncertain about direct continuity of this cystic lesion with the dorsal pancreatic duct, the ducts skirts the margin of the lesion. It is possible that mass effect from the cystic lesion is causing some relative obstruction of the duct in turn leading to focal pancreatitis, but the dorsal pancreatic duct is not overtly dilated. The cystic lesion could represent a small pseudocyst or intraductal papillary mucinous neoplasm. Surveillance imaging likely warranted (annual surveillance). 2. 1.2 cm poorly enhancing region along the pancreatic tail may represent incipient  pseudocyst formation, I find this much less likely to represent pancreatic necrosis. If the patient is fulminantly ill to such a degree as to potentially suggest pancreatic necrosis then close surveillance would be recommended. 3. Trace left pleural effusion. Considerable edema stranding along the left retroperitoneum. Electronically Signed   By: Van Clines M.D.   On: 08/04/2016 12:00      Impression / Plan:   Carrin Chaney is a 46 y.o. y/o female with Recurrent pancreatitis with acute on chronic pancreatitis with multiple cystic lesions seen on the MRI with the recommendation of surveillance MRIs due to a possible IPMN.  The patient has again been told to stop drinking and should be  treated symptomatically with her acute on chronic pancreatitis.  She should be continued on hydration with pain control and clear liquid diet.  She should also follow up as an outpatient with Dr. Vicente Males.   Thank you for involving me in the care of this patient.      LOS: 1 day   April Lame, MD  08/04/2016, 7:40 PM   Note: This dictation was prepared with Dragon dictation along with smaller phrase technology. Any transcriptional errors that result from this process are unintentional.

## 2016-08-04 NOTE — Progress Notes (Signed)
[facility] [facility] [place] NAME: April Chaney    MR#:  [id number]  DATE OF BIRTH:  25-Jan-1971  SUBJECTIVE:   Patient here due to epigastric abdominal pain suspected to have pancreatitis. Still having some abdominal pain but no nausea or vomiting. Patient's MRI/MRCP showing a pancreatic pseudocyst.  REVIEW OF SYSTEMS:    Review of Systems  Constitutional: Negative for chills and fever.  HENT: Negative for congestion and tinnitus.   Eyes: Negative for blurred vision and double vision.  Respiratory: Negative for cough, shortness of breath and wheezing.   Cardiovascular: Negative for chest pain, orthopnea and PND.  Gastrointestinal: Positive for abdominal pain. Negative for diarrhea, nausea and vomiting.  Genitourinary: Negative for dysuria and hematuria.  Neurological: Negative for dizziness, sensory change and focal weakness.  All other systems reviewed and are negative.   Nutrition: Clear liquids Tolerating Diet: Yes Tolerating PT: Ambulatory     DRUG ALLERGIES:  No Known Allergies  VITALS:  Blood pressure (!) 150/83, pulse 95, temperature 98.7 F (37.1 C), temperature source Oral, resp. rate 18, height 5' (1.524 m), weight 68 kg (150 lb), SpO2 94 %.  PHYSICAL EXAMINATION:   Physical Exam  GENERAL:  46 y.o.-year-old patient lying in the bed in no acute distress.  EYES: Pupils equal, round, reactive to light and accommodation. No scleral icterus. Extraocular muscles intact.  HEENT: Head atraumatic, normocephalic. Oropharynx and nasopharynx clear.  NECK:  Supple, no jugular venous distention. No thyroid enlargement, no tenderness.  LUNGS: Normal breath sounds bilaterally, no wheezing, rales, rhonchi. No use of accessory muscles of respiration.  CARDIOVASCULAR: S1, S2 normal. No murmurs, rubs, or gallops.  ABDOMEN: Soft, Tender in epigastria area, but no rebound, rigidity, nondistended. Bowel sounds present. No organomegaly or  mass.  EXTREMITIES: No cyanosis, clubbing or edema b/l.    NEUROLOGIC: Cranial nerves II through XII are intact. No focal Motor or sensory deficits b/l.   PSYCHIATRIC: The patient is alert and oriented x 3.  SKIN: No obvious rash, lesion, or ulcer.    LABORATORY PANEL:   CBC  Recent Labs Lab 08/04/16 0440  WBC 10.1  HGB 12.8  HCT 36.8  PLT 195   ------------------------------------------------------------------------------------------------------------------  Chemistries   Recent Labs Lab 08/03/16 1730 08/04/16 0440  NA 137 136  K 4.1 3.2*  CL 104 106  CO2 20* 24  GLUCOSE 120* 131*  BUN 11 11  CREATININE 0.59 0.63  CALCIUM 8.8* 7.6*  AST 32  --   ALT 14  --   ALKPHOS 74  --   BILITOT 0.6  --    ------------------------------------------------------------------------------------------------------------------  Cardiac Enzymes No results for input(s): TROPONINI in the last 168 hours. ------------------------------------------------------------------------------------------------------------------  RADIOLOGY:  Mr 3d Recon At Scanner  Result Date: 08/04/2016 CLINICAL DATA:  Elevated lipase levels. Epigastric pain. History pancreatic cystic lesion. EXAM: MRI ABDOMEN WITHOUT AND WITH CONTRAST (INCLUDING MRCP) TECHNIQUE: Multiplanar multisequence MR imaging of the abdomen was performed both before and after the administration of intravenous contrast. Heavily T2-weighted images of the biliary and pancreatic ducts were obtained, and three-dimensional MRCP images were rendered by post processing. CONTRAST:  56mL MULTIHANCE GADOBENATE DIMEGLUMINE 529 MG/ML IV SOLN COMPARISON:  06/17/2016 FINDINGS: Lower chest: Trace left pleural effusion. Hepatobiliary: Unremarkable Pancreas: Nonenhancing unilocular 1.9 by 1.5 cm cystic lesion at the junction of the pancreatic body and tail, uncertain activity to the dorsal pancreatic duct port skirts the upper margin of this lesion. By my  measurements this lesion measured 2.0  by 1.4 cm on 12/04/2013, but was not visible on 09/15/2007. No dilatation of the dorsal pancreatic duct is identified, however, the pancreatic tail distal to this cystic lesion appears acutely inflamed, with associated abnormal edema signal and expansion of the pancreatic parenchyma as well as extensive surrounding edema tracking along tissue planes. Restricted diffusion in knee pancreatic tail medial to this lesion but not in the rest of the pancreas. There is mild hypoenhancement along the tip of the tail the pancreas for example image 30/17 with a 1.2 cm poorly enhancing region in the pancreatic tail which may represent incipient pseudocyst formation. I find this much less likely to represent early pancreatic necrosis. Spleen:  Unremarkable Adrenals/Urinary Tract:  Unremarkable Stomach/Bowel: Unremarkable Vascular/Lymphatic:  Unremarkable Other:  No supplemental non-categorized findings. Musculoskeletal: Unremarkable IMPRESSION: 1. Unilocular nonenhancing 1.9 by 1.5 cm cystic lesion at the junction of the pancreatic body and tail. The pancreatic tail distal to this lesion appear significantly inflamed, with accentuated diffusion weighted signal, mild expansion, and mild hypoenhancement with surrounding inflammatory edema favoring acute pancreatitis. The pancreas proximal to this cystic lesion, along the body and head, is not inflamed. I am uncertain about direct continuity of this cystic lesion with the dorsal pancreatic duct, the ducts skirts the margin of the lesion. It is possible that mass effect from the cystic lesion is causing some relative obstruction of the duct in turn leading to focal pancreatitis, but the dorsal pancreatic duct is not overtly dilated. The cystic lesion could represent a small pseudocyst or intraductal papillary mucinous neoplasm. Surveillance imaging likely warranted (annual surveillance). 2. 1.2 cm poorly enhancing region along the pancreatic  tail may represent incipient pseudocyst formation, I find this much less likely to represent pancreatic necrosis. If the patient is fulminantly ill to such a degree as to potentially suggest pancreatic necrosis then close surveillance would be recommended. 3. Trace left pleural effusion. Considerable edema stranding along the left retroperitoneum. Electronically Signed   By: Van Clines M.D.   On: 08/04/2016 12:00   Mr Abdomen Mrcp W Wo Contast  Result Date: 08/04/2016 CLINICAL DATA:  Elevated lipase levels. Epigastric pain. History pancreatic cystic lesion. EXAM: MRI ABDOMEN WITHOUT AND WITH CONTRAST (INCLUDING MRCP) TECHNIQUE: Multiplanar multisequence MR imaging of the abdomen was performed both before and after the administration of intravenous contrast. Heavily T2-weighted images of the biliary and pancreatic ducts were obtained, and three-dimensional MRCP images were rendered by post processing. CONTRAST:  6mL MULTIHANCE GADOBENATE DIMEGLUMINE 529 MG/ML IV SOLN COMPARISON:  06/17/2016 FINDINGS: Lower chest: Trace left pleural effusion. Hepatobiliary: Unremarkable Pancreas: Nonenhancing unilocular 1.9 by 1.5 cm cystic lesion at the junction of the pancreatic body and tail, uncertain activity to the dorsal pancreatic duct port skirts the upper margin of this lesion. By my measurements this lesion measured 2.0 by 1.4 cm on 12/04/2013, but was not visible on 09/15/2007. No dilatation of the dorsal pancreatic duct is identified, however, the pancreatic tail distal to this cystic lesion appears acutely inflamed, with associated abnormal edema signal and expansion of the pancreatic parenchyma as well as extensive surrounding edema tracking along tissue planes. Restricted diffusion in knee pancreatic tail medial to this lesion but not in the rest of the pancreas. There is mild hypoenhancement along the tip of the tail the pancreas for example image 30/17 with a 1.2 cm poorly enhancing region in the  pancreatic tail which may represent incipient pseudocyst formation. I find this much less likely to represent early pancreatic necrosis. Spleen:  Unremarkable Adrenals/Urinary Tract:  Unremarkable Stomach/Bowel: Unremarkable Vascular/Lymphatic:  Unremarkable Other:  No supplemental non-categorized findings. Musculoskeletal: Unremarkable IMPRESSION: 1. Unilocular nonenhancing 1.9 by 1.5 cm cystic lesion at the junction of the pancreatic body and tail. The pancreatic tail distal to this lesion appear significantly inflamed, with accentuated diffusion weighted signal, mild expansion, and mild hypoenhancement with surrounding inflammatory edema favoring acute pancreatitis. The pancreas proximal to this cystic lesion, along the body and head, is not inflamed. I am uncertain about direct continuity of this cystic lesion with the dorsal pancreatic duct, the ducts skirts the margin of the lesion. It is possible that mass effect from the cystic lesion is causing some relative obstruction of the duct in turn leading to focal pancreatitis, but the dorsal pancreatic duct is not overtly dilated. The cystic lesion could represent a small pseudocyst or intraductal papillary mucinous neoplasm. Surveillance imaging likely warranted (annual surveillance). 2. 1.2 cm poorly enhancing region along the pancreatic tail may represent incipient pseudocyst formation, I find this much less likely to represent pancreatic necrosis. If the patient is fulminantly ill to such a degree as to potentially suggest pancreatic necrosis then close surveillance would be recommended. 3. Trace left pleural effusion. Considerable edema stranding along the left retroperitoneum. Electronically Signed   By: Van Clines M.D.   On: 08/04/2016 12:00     ASSESSMENT AND PLAN:   46 year old female with past medical history of alcohol abuse, previous history of alcoholic pancreatitis, GERD, diabetes, hypertension, peptic ulcer disease who presents to the  hospital due to abdominal pain nausea vomiting.  1. Acute pancreatitis-this is the cause of patient's abdominal pain nausea vomiting. -Patient MRI and MRCP does show a pancreatic pseudocyst but no necrosis and no need for acute intervention presently. -Continue supportive care with IV fluids, pain control, and antiemetics. Await further gastroenterology input. -Started on clear liquid diet today, lipase has trended down.  2. 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 -Supportive care with pain control. Await gastroenterology input, no acute surgical intervention but if patient were to needed it can be done as an outpatient.  3. DM - cont. SSI and follow sugars.     All the records are reviewed and case discussed with Care Management/Social Worker. Management plans discussed with the patient, family and they are in agreement.  CODE STATUS: Full code  DVT Prophylaxis: Lovenox  TOTAL TIME TAKING CARE OF THIS PATIENT: 30 minutes.   POSSIBLE D/C IN 1-2 DAYS, DEPENDING ON CLINICAL CONDITION.   Henreitta Leber M.D on 08/04/2016 at 3:09 PM  Between 7am to 6pm - Pager - [phone]  After 6pm go to [email or web address] - Proofreader  [employer] [facility]  Office  [phone]  CC: Primary care physician; Garlan Fillers, MD

## 2016-08-04 NOTE — Plan of Care (Signed)
Problem: Fluid Volume: Goal: Ability to maintain a balanced intake and output will improve Outcome: Not Progressing Patient is NPO.  Problem: Nutrition: Goal: Adequate nutrition will be maintained Outcome: Not Progressing Patient is NPO.   

## 2016-08-04 NOTE — Plan of Care (Signed)
Problem: Pain Managment: Goal: General experience of comfort will improve Outcome: Progressing Patient educated on the need to let the nurse know when she is in pain early on so that her pain does not get to a high number

## 2016-08-05 LAB — URINALYSIS, COMPLETE (UACMP) WITH MICROSCOPIC
BILIRUBIN URINE: NEGATIVE
Glucose, UA: NEGATIVE mg/dL
KETONES UR: 5 mg/dL — AB
LEUKOCYTES UA: NEGATIVE
Nitrite: NEGATIVE
PROTEIN: NEGATIVE mg/dL
Specific Gravity, Urine: 1.011 (ref 1.005–1.030)
pH: 6 (ref 5.0–8.0)

## 2016-08-05 LAB — GLUCOSE, CAPILLARY
GLUCOSE-CAPILLARY: 108 mg/dL — AB (ref 65–99)
GLUCOSE-CAPILLARY: 129 mg/dL — AB (ref 65–99)
Glucose-Capillary: 138 mg/dL — ABNORMAL HIGH (ref 65–99)
Glucose-Capillary: 116 mg/dL — ABNORMAL HIGH (ref 65–99)

## 2016-08-05 MED ORDER — SENNOSIDES-DOCUSATE SODIUM 8.6-50 MG PO TABS
1.0000 | ORAL_TABLET | Freq: Every day | ORAL | Status: DC
  Administered 2016-08-05: 1 via ORAL
  Filled 2016-08-05: qty 1

## 2016-08-05 MED ORDER — HYDROMORPHONE HCL 1 MG/ML IJ SOLN: 1.0000 mg | Freq: Four times a day (QID) | INTRAMUSCULAR | Status: DC | PRN

## 2016-08-05 NOTE — Plan of Care (Signed)
Problem: Fluid Volume: Goal: Ability to maintain a balanced intake and output will improve Outcome: Progressing Patient is now on clear liquids.

## 2016-08-05 NOTE — Progress Notes (Signed)
[place] [facility] [place] NAME: April Chaney    MR#:  [id number]  DATE OF BIRTH:  27-Mar-1971  SUBJECTIVE:   Patient here due to epigastric abdominal pain suspected to have pancreatitis.  Abdominal pain much improved. No N/V and tolerating clear liquid diet well.   REVIEW OF SYSTEMS:    Review of Systems  Constitutional: Negative for chills and fever.  HENT: Negative for congestion and tinnitus.   Eyes: Negative for blurred vision and double vision.  Respiratory: Negative for cough, shortness of breath and wheezing.   Cardiovascular: Negative for chest pain, orthopnea and PND.  Gastrointestinal: Positive for abdominal pain. Negative for diarrhea, nausea and vomiting.  Genitourinary: Negative for dysuria and hematuria.  Neurological: Negative for dizziness, sensory change and focal weakness.  All other systems reviewed and are negative.   Nutrition: Clear liquids Tolerating Diet: Yes Tolerating PT: Ambulatory     DRUG ALLERGIES:  No Known Allergies  VITALS:  Blood pressure (!) 164/99, pulse 99, temperature 98.5 F (36.9 C), temperature source Oral, resp. rate 16, height 5' (1.524 m), weight 68 kg (150 lb), SpO2 98 %.  PHYSICAL EXAMINATION:   Physical Exam  GENERAL:  46 y.o.-year-old patient lying in the bed in no acute distress.  EYES: Pupils equal, round, reactive to light and accommodation. No scleral icterus. Extraocular muscles intact.  HEENT: Head atraumatic, normocephalic. Oropharynx and nasopharynx clear.  NECK:  Supple, no jugular venous distention. No thyroid enlargement, no tenderness.  LUNGS: Normal breath sounds bilaterally, no wheezing, rales, rhonchi. No use of accessory muscles of respiration.  CARDIOVASCULAR: S1, S2 normal. No murmurs, rubs, or gallops.  ABDOMEN: Soft, Tender in epigastria area, but no rebound, rigidity, nondistended. Bowel sounds present. No organomegaly or mass.  EXTREMITIES: No cyanosis,  clubbing or edema b/l.    NEUROLOGIC: Cranial nerves II through XII are intact. No focal Motor or sensory deficits b/l.   PSYCHIATRIC: The patient is alert and oriented x 3.  SKIN: No obvious rash, lesion, or ulcer.    LABORATORY PANEL:   CBC  Recent Labs Lab 08/04/16 0440  WBC 10.1  HGB 12.8  HCT 36.8  PLT 195   ------------------------------------------------------------------------------------------------------------------  Chemistries   Recent Labs Lab 08/03/16 1730 08/04/16 0440  NA 137 136  K 4.1 3.2*  CL 104 106  CO2 20* 24  GLUCOSE 120* 131*  BUN 11 11  CREATININE 0.59 0.63  CALCIUM 8.8* 7.6*  AST 32  --   ALT 14  --   ALKPHOS 74  --   BILITOT 0.6  --    ------------------------------------------------------------------------------------------------------------------  Cardiac Enzymes No results for input(s): TROPONINI in the last 168 hours. ------------------------------------------------------------------------------------------------------------------  RADIOLOGY:  Mr 3d Recon At Scanner  Result Date: 08/04/2016 CLINICAL DATA:  Elevated lipase levels. Epigastric pain. History pancreatic cystic lesion. EXAM: MRI ABDOMEN WITHOUT AND WITH CONTRAST (INCLUDING MRCP) TECHNIQUE: Multiplanar multisequence MR imaging of the abdomen was performed both before and after the administration of intravenous contrast. Heavily T2-weighted images of the biliary and pancreatic ducts were obtained, and three-dimensional MRCP images were rendered by post processing. CONTRAST:  68mL MULTIHANCE GADOBENATE DIMEGLUMINE 529 MG/ML IV SOLN COMPARISON:  06/17/2016 FINDINGS: Lower chest: Trace left pleural effusion. Hepatobiliary: Unremarkable Pancreas: Nonenhancing unilocular 1.9 by 1.5 cm cystic lesion at the junction of the pancreatic body and tail, uncertain activity to the dorsal pancreatic duct port skirts the upper margin of this lesion. By my measurements this lesion measured 2.0 by  1.4  cm on 12/04/2013, but was not visible on 09/15/2007. No dilatation of the dorsal pancreatic duct is identified, however, the pancreatic tail distal to this cystic lesion appears acutely inflamed, with associated abnormal edema signal and expansion of the pancreatic parenchyma as well as extensive surrounding edema tracking along tissue planes. Restricted diffusion in knee pancreatic tail medial to this lesion but not in the rest of the pancreas. There is mild hypoenhancement along the tip of the tail the pancreas for example image 30/17 with a 1.2 cm poorly enhancing region in the pancreatic tail which may represent incipient pseudocyst formation. I find this much less likely to represent early pancreatic necrosis. Spleen:  Unremarkable Adrenals/Urinary Tract:  Unremarkable Stomach/Bowel: Unremarkable Vascular/Lymphatic:  Unremarkable Other:  No supplemental non-categorized findings. Musculoskeletal: Unremarkable IMPRESSION: 1. Unilocular nonenhancing 1.9 by 1.5 cm cystic lesion at the junction of the pancreatic body and tail. The pancreatic tail distal to this lesion appear significantly inflamed, with accentuated diffusion weighted signal, mild expansion, and mild hypoenhancement with surrounding inflammatory edema favoring acute pancreatitis. The pancreas proximal to this cystic lesion, along the body and head, is not inflamed. I am uncertain about direct continuity of this cystic lesion with the dorsal pancreatic duct, the ducts skirts the margin of the lesion. It is possible that mass effect from the cystic lesion is causing some relative obstruction of the duct in turn leading to focal pancreatitis, but the dorsal pancreatic duct is not overtly dilated. The cystic lesion could represent a small pseudocyst or intraductal papillary mucinous neoplasm. Surveillance imaging likely warranted (annual surveillance). 2. 1.2 cm poorly enhancing region along the pancreatic tail may represent incipient pseudocyst  formation, I find this much less likely to represent pancreatic necrosis. If the patient is fulminantly ill to such a degree as to potentially suggest pancreatic necrosis then close surveillance would be recommended. 3. Trace left pleural effusion. Considerable edema stranding along the left retroperitoneum. Electronically Signed   By: Van Clines M.D.   On: 08/04/2016 12:00   Mr Abdomen Mrcp W Wo Contast  Result Date: 08/04/2016 CLINICAL DATA:  Elevated lipase levels. Epigastric pain. History pancreatic cystic lesion. EXAM: MRI ABDOMEN WITHOUT AND WITH CONTRAST (INCLUDING MRCP) TECHNIQUE: Multiplanar multisequence MR imaging of the abdomen was performed both before and after the administration of intravenous contrast. Heavily T2-weighted images of the biliary and pancreatic ducts were obtained, and three-dimensional MRCP images were rendered by post processing. CONTRAST:  16mL MULTIHANCE GADOBENATE DIMEGLUMINE 529 MG/ML IV SOLN COMPARISON:  06/17/2016 FINDINGS: Lower chest: Trace left pleural effusion. Hepatobiliary: Unremarkable Pancreas: Nonenhancing unilocular 1.9 by 1.5 cm cystic lesion at the junction of the pancreatic body and tail, uncertain activity to the dorsal pancreatic duct port skirts the upper margin of this lesion. By my measurements this lesion measured 2.0 by 1.4 cm on 12/04/2013, but was not visible on 09/15/2007. No dilatation of the dorsal pancreatic duct is identified, however, the pancreatic tail distal to this cystic lesion appears acutely inflamed, with associated abnormal edema signal and expansion of the pancreatic parenchyma as well as extensive surrounding edema tracking along tissue planes. Restricted diffusion in knee pancreatic tail medial to this lesion but not in the rest of the pancreas. There is mild hypoenhancement along the tip of the tail the pancreas for example image 30/17 with a 1.2 cm poorly enhancing region in the pancreatic tail which may represent incipient  pseudocyst formation. I find this much less likely to represent early pancreatic necrosis. Spleen:  Unremarkable Adrenals/Urinary Tract:  Unremarkable  Stomach/Bowel: Unremarkable Vascular/Lymphatic:  Unremarkable Other:  No supplemental non-categorized findings. Musculoskeletal: Unremarkable IMPRESSION: 1. Unilocular nonenhancing 1.9 by 1.5 cm cystic lesion at the junction of the pancreatic body and tail. The pancreatic tail distal to this lesion appear significantly inflamed, with accentuated diffusion weighted signal, mild expansion, and mild hypoenhancement with surrounding inflammatory edema favoring acute pancreatitis. The pancreas proximal to this cystic lesion, along the body and head, is not inflamed. I am uncertain about direct continuity of this cystic lesion with the dorsal pancreatic duct, the ducts skirts the margin of the lesion. It is possible that mass effect from the cystic lesion is causing some relative obstruction of the duct in turn leading to focal pancreatitis, but the dorsal pancreatic duct is not overtly dilated. The cystic lesion could represent a small pseudocyst or intraductal papillary mucinous neoplasm. Surveillance imaging likely warranted (annual surveillance). 2. 1.2 cm poorly enhancing region along the pancreatic tail may represent incipient pseudocyst formation, I find this much less likely to represent pancreatic necrosis. If the patient is fulminantly ill to such a degree as to potentially suggest pancreatic necrosis then close surveillance would be recommended. 3. Trace left pleural effusion. Considerable edema stranding along the left retroperitoneum. Electronically Signed   By: Van Clines M.D.   On: 08/04/2016 12:00     ASSESSMENT AND PLAN:   46 year old female with past medical history of alcohol abuse, previous history of alcoholic pancreatitis, GERD, diabetes, hypertension, peptic ulcer disease who presents to the hospital due to abdominal pain nausea  vomiting.  1. Acute pancreatitis-this is the cause of patient's abdominal pain nausea vomiting. -Patient MRI and MRCP does show a pancreatic pseudocyst but no pancreatic necrosis and no need for acute intervention presently. -Continue supportive care with IV fluids, pain control, and antiemetics. Appreciate GI input and cont. Current care.  -Tolerating clear liquid diet and will advance to full liquid and then as tolerated.  - if doing well the possibly d/c home tomorrow. Lipase improved.   2. 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 -Supportive care with pain control. Appreciate gastroenterology input, no acute surgical intervention but if patient were to need it can be done as an outpatient.  3. DM - cont. SSI and follow sugars.   Possible d/c home tomorrow.   All the records are reviewed and case discussed with Care Management/Social Worker. Management plans discussed with the patient, family and they are in agreement.  CODE STATUS: Full code  DVT Prophylaxis: Lovenox  TOTAL TIME TAKING CARE OF THIS PATIENT: 25 minutes.   POSSIBLE D/C IN 1-2 DAYS, DEPENDING ON CLINICAL CONDITION.   Henreitta Leber M.D on 08/05/2016 at 2:20 PM  Between 7am to 6pm - Pager - [phone]  After 6pm go to [email or web address] - Proofreader  [facility]  Office  [phone]  CC: Primary care physician; Garlan Fillers, MD

## 2016-08-06 LAB — GLUCOSE, CAPILLARY
GLUCOSE-CAPILLARY: 128 mg/dL — AB (ref 65–99)
GLUCOSE-CAPILLARY: 90 mg/dL (ref 65–99)
Glucose-Capillary: 127 mg/dL — ABNORMAL HIGH (ref 65–99)

## 2016-08-06 LAB — COMPREHENSIVE METABOLIC PANEL
ALK PHOS: 45 U/L (ref 38–126)
ALT: 9 U/L — ABNORMAL LOW (ref 14–54)
ANION GAP: 8 (ref 5–15)
AST: 16 U/L (ref 15–41)
Albumin: 3.1 g/dL — ABNORMAL LOW (ref 3.5–5.0)
BUN: <5 mg/dL — ABNORMAL LOW (ref 6–20)
CALCIUM: 8.1 mg/dL — AB (ref 8.9–10.3)
CHLORIDE: 104 mmol/L (ref 101–111)
CO2: 24 mmol/L (ref 22–32)
Creatinine, Ser: 0.57 mg/dL (ref 0.44–1.00)
GFR calc non Af Amer: >60 mL/min (ref >60)
Glucose, Bld: 117 mg/dL — ABNORMAL HIGH (ref 65–99)
Potassium: 2.7 mmol/L — CL (ref 3.5–5.1)
Sodium: 136 mmol/L (ref 135–145)
Total Bilirubin: 0.6 mg/dL (ref 0.3–1.2)
Total Protein: 6.6 g/dL (ref 6.5–8.1)

## 2016-08-06 LAB — MAGNESIUM: Magnesium: 1.9 mg/dL (ref 1.7–2.4)

## 2016-08-06 LAB — LIPASE, BLOOD: LIPASE: 56 U/L — AB (ref 11–51)

## 2016-08-06 MED ORDER — POTASSIUM CHLORIDE ER 20 MEQ PO TBCR: 20.0000 meq | EXTENDED_RELEASE_TABLET | Freq: Two times a day (BID) | ORAL | 0 refills | Status: DC

## 2016-08-06 MED ORDER — POTASSIUM CHLORIDE CRYS ER 20 MEQ PO TBCR
40.0000 meq | EXTENDED_RELEASE_TABLET | Freq: Once | ORAL | Status: AC
  Administered 2016-08-06: 40 meq via ORAL
  Filled 2016-08-06: qty 2

## 2016-08-06 NOTE — Progress Notes (Signed)
Critical potassium of 2.7 reported to Dr. Marcille Blanco. Dr to put in orders.

## 2016-08-06 NOTE — Discharge Summary (Signed)
[place] at [place] NAME: April Chaney    MR#:  [id number]  DATE OF BIRTH:  1971/05/26  DATE OF ADMISSION:  08/03/2016 ADMITTING PHYSICIAN: Dustin Flock, MD  DATE OF DISCHARGE: 08/06/2016 12:55 PM  PRIMARY CARE PHYSICIAN: Garlan Fillers, MD    ADMISSION DIAGNOSIS:  Epigastric pain [R10.13] Abdominal pain [R10.9] Alcohol-induced acute pancreatitis, unspecified complication status 99991111  DISCHARGE DIAGNOSIS:  Active Problems:   Abdominal pain   Epigastric pain   Alcohol-induced acute pancreatitis   SECONDARY DIAGNOSIS:   Past Medical History:  Diagnosis Date  . Colon polyps   . Diabetes mellitus without complication ([place])   . DM (diabetes mellitus) ([place])   . GERD (gastroesophageal reflux disease)   . Hypertension   . Pancreatitis   . Presence of artificial hip    has to take antibiotics prior to any procedure-dental, etc  . Ulcer [facility])     HOSPITAL COURSE:   46 year old female with past medical history of alcohol abuse, previous history of alcoholic pancreatitis, GERD, diabetes, hypertension, peptic ulcer disease who presents to the hospital due to abdominal pain nausea vomiting.  1. Acute pancreatitis-this was the cause of patient's abdominal pain nausea vomiting. -Patient MRI and MRCP does show a pancreatic pseudocyst but no pancreatic necrosis  -Patient was treated supportively with IV fluids, pain control, and antiemetics. A gastroenterology consult was obtained and agreed with this management and did not think the patient needed any acute intervention or pancreatic pseudocyst drainage. -After supportive care patient's critical symptoms have improved. Her lipase is limited down. Her LFTs have improved. Her diet was advanced slowly from a liquid to a soft diet which she is tolerating without any further nausea vomiting or worsening abdominal pain. She is therefore being discharged home.  2. 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 - Seen by gastroenterology, no acute surgical intervention needed while in the hospital.   3. DM - while in the hospital pt. Was no SSI and now resume Metformin upon discharge.   4. Hypokalemia - pt. Was discharged on some Oral Potassium supplements.   DISCHARGE CONDITIONS:   Stable.   CONSULTS OBTAINED:  Treatment Team:  [name], MD  DRUG ALLERGIES:  No Known Allergies  DISCHARGE MEDICATIONS:   Allergies as of 08/06/2016   No Known Allergies     Medication List    TAKE these medications   FISH OIL PO Take 1 capsule by mouth daily.   levonorgestrel 20 MCG/24HR IUD Commonly known as:  MIRENA by Intrauterine route.   metformin 500 MG (OSM) 24 hr tablet Commonly known as:  FORTAMET Take 500 mg by mouth 2 (two) times daily with a meal.   multivitamin with minerals Tabs tablet Take 1 tablet by mouth daily.   ondansetron 4 MG tablet Commonly known as:  ZOFRAN Take 1 tablet (4 mg total) by mouth daily as needed for nausea or vomiting.   Potassium Chloride ER 20 MEQ Tbcr Take 20 mEq by mouth 2 (two) times daily.   XELJANZ PO Take 1 tablet by mouth 2 (two) times daily.         DISCHARGE INSTRUCTIONS:   DIET:  Diabetic diet  DISCHARGE CONDITION:  Stable  ACTIVITY:  Activity as tolerated  OXYGEN:  Home Oxygen: No.   Oxygen Delivery: room air  DISCHARGE LOCATION:  home   If you experience worsening of your admission symptoms, develop shortness of breath, life threatening emergency, suicidal  or homicidal thoughts you must seek medical attention immediately by calling 911 or calling your MD immediately  if symptoms less severe.  You Must read complete instructions/literature along with all the possible adverse reactions/side effects for all the Medicines you take and that have been prescribed to you. Take any new Medicines after you have completely understood and accpet all the  possible adverse reactions/side effects.   Please note  You were cared for by a hospitalist during your hospital stay. If you have any questions about your discharge medications or the care you received while you were in the hospital after you are discharged, you can call the unit and asked to speak with the hospitalist on call if the hospitalist that took care of you is not available. Once you are discharged, your primary care physician will handle any further medical issues. Please note that NO REFILLS for any discharge medications will be authorized once you are discharged, as it is imperative that you return to your primary care physician (or establish a relationship with a primary care physician if you do not have one) for your aftercare needs so that they can reassess your need for medications and monitor your lab values.     Today   Pt. Still has abdominal pain but much improved.  No N/V and tolerating PO well. Will d/c home today.   VITAL SIGNS:  Blood pressure (!) 155/83, pulse 73, temperature 98 F (36.7 C), temperature source Oral, resp. rate 18, height 5' (1.524 m), weight 68 kg (150 lb), SpO2 99 %.  I/O:   Intake/Output Summary (Last 24 hours) at 08/06/16 1631 Last data filed at 08/06/16 1005  Gross per 24 hour  Intake          3801.23 ml  Output                0 ml  Net          3801.23 ml    PHYSICAL EXAMINATION:   GENERAL:  46 y.o.-year-old patient lying in the bed in no acute distress.  EYES: Pupils equal, round, reactive to light and accommodation. No scleral icterus. Extraocular muscles intact.  HEENT: Head atraumatic, normocephalic. Oropharynx and nasopharynx clear.  NECK:  Supple, no jugular venous distention. No thyroid enlargement, no tenderness.  LUNGS: Normal breath sounds bilaterally, no wheezing, rales, rhonchi. No use of accessory muscles of respiration.  CARDIOVASCULAR: S1, S2 normal. No murmurs, rubs, or gallops.  ABDOMEN: Soft, Tender in epigastria  area, but no rebound, rigidity, nondistended. Bowel sounds present. No organomegaly or mass.  EXTREMITIES: No cyanosis, clubbing or edema b/l.    NEUROLOGIC: Cranial nerves II through XII are intact. No focal Motor or sensory deficits b/l.   PSYCHIATRIC: The patient is alert and oriented x 3.  SKIN: No obvious rash, lesion, or ulcer.   DATA REVIEW:   CBC  Recent Labs Lab 08/04/16 0440  WBC 10.1  HGB 12.8  HCT 36.8  PLT 195    Chemistries   Recent Labs Lab 08/06/16 0612  NA 136  K 2.7*  CL 104  CO2 24  GLUCOSE 117*  BUN <5*  CREATININE 0.57  CALCIUM 8.1*  MG 1.9  AST 16  ALT 9*  ALKPHOS 45  BILITOT 0.6    Cardiac Enzymes No results for input(s): TROPONINI in the last 168 hours.   RADIOLOGY:  No results found.    Management plans discussed with the patient, family and they are in agreement.  CODE STATUS:  Code Status History    Date Active Date Inactive Code Status Order ID Comments User Context   08/03/2016  9:17 PM 08/06/2016  4:00 PM Full Code [id number]  Dustin Flock, MD Inpatient   08/03/2016  8:39 PM 08/03/2016  9:17 PM Full Code [id number]  Dustin Flock, MD ED      TOTAL TIME TAKING CARE OF THIS PATIENT: 40 minutes.    Henreitta Leber M.D on 08/06/2016 at 4:31 PM  Between 7am to 6pm - Pager - [phone]  After 6pm go to [email or web address] - Proofreader  [facility]  Office  [phone]  CC: Primary care physician; Garlan Fillers, MD

## 2016-08-06 NOTE — Progress Notes (Signed)
08/06/2016 12:25 PM  BP (!) 155/83 (BP Location: Left Arm)   Pulse 73   Temp 98 F (36.7 C) (Oral)   Resp 18   Ht 5' (1.524 m)   Wt 68 kg (150 lb)   SpO2 99%   BMI 29.29 kg/m  Patient discharged per MD orders. Discharge instructions reviewed with patient and patient verbalized understanding. IV removed per policy. Prescriptions discussed and given to patient. Work excuse printed and given to patient. Discharged via wheelchair escorted by auxilary.  Almedia Balls, RN

## 2016-08-06 NOTE — Progress Notes (Signed)
April Chaney was admitted to the Hospital on 08/03/2016 and Discharged  08/06/2016 and should be excused from work/school   for 5 days starting 08/03/2016 , may return to work/school without any restrictions.  Call Abel Presto MD, [facility]  [phone] with questions.  Henreitta Leber M.D on 08/06/2016,at 11:54 AM

## 2016-08-06 NOTE — Discharge Instructions (Signed)
Acute Pancreatitis °Introduction °Acute pancreatitis is a condition in which the pancreas suddenly gets irritated and swollen (has inflammation). The pancreas is a large gland behind the stomach. It makes enzymes that help to digest food. The pancreas also makes hormones that help to control your blood sugar. Acute pancreatitis happens when the enzymes attack the pancreas and damage it. Most attacks last a couple of days and can cause serious problems. °Follow these instructions at home: °Eating and drinking °· Follow instructions from your doctor about diet. You may need to: °¨ Avoid alcohol. °¨ Limit how much fat is in your diet. °· Eat small meals often. Avoid eating big meals. °· Drink enough fluid to keep your pee (urine) clear or pale yellow. °· Do not drink alcohol if it caused your condition. °General instructions °· Take over-the-counter and prescription medicines only as told by your doctor. °· Do not use any tobacco products. These include cigarettes, chewing tobacco, and e-cigarettes. If you need help quitting, ask your doctor. °· Get plenty of rest. °· If directed, check your blood sugar at home as told by your doctor. °· Keep all follow-up visits as told by your doctor. This is important. °Contact a doctor if: °· You do not get better as quickly as expected. °· You have new symptoms. °· Your symptoms get worse. °· You have lasting pain or weakness. °· You continue to feel sick to your stomach (nauseous). °· You get better and then you have another pain attack. °· You have a fever. °Get help right away if: °· You cannot eat or keep fluids down. °· Your pain becomes very bad. °· Your skin or the white part of your eyes turns yellow (jaundice). °· You throw up (vomit). °· You feel dizzy or you pass out (faint). °· Your blood sugar is high (over 300 mg/dL). °This information is not intended to replace advice given to you by your health care provider. Make sure you discuss any questions you have with your  health care provider. °Document Released: 11/26/2007 Document Revised: 11/15/2015 Document Reviewed: 03/13/2015 °© 2017 Elsevier ° °

## 2016-08-06 NOTE — Progress Notes (Signed)
Critical lab value reported to RN, potassium- 2.7. On- call provider paged twice with no response.

## 2016-08-06 NOTE — Progress Notes (Signed)
Dr. Verdell Carmine notified of potassium 2.7. MD order to give 40 meq K PO x1.

## 2016-10-01 ENCOUNTER — Encounter: Payer: Self-pay | Admitting: Gastroenterology

## 2016-10-01 ENCOUNTER — Telehealth: Payer: Self-pay | Admitting: Gastroenterology

## 2016-10-01 NOTE — Telephone Encounter (Signed)
error 

## 2016-10-01 NOTE — Telephone Encounter (Signed)
*  STAT* If patient is at the pharmacy, call can be transferred to refill team.   1. Which medications need to be refilled? (please list name of each medication and dose if known) Omeprazole 20 mg  2. Which pharmacy/location (including street and city if local pharmacy) is medication to be sent to? [place]  3. Do they need a 30 day or 90 day supply? 90 day

## 2016-10-03 ENCOUNTER — Telehealth: Payer: Self-pay

## 2016-10-03 ENCOUNTER — Other Ambulatory Visit: Payer: Self-pay

## 2016-10-03 DIAGNOSIS — K219 Gastro-esophageal reflux disease without esophagitis: Secondary | ICD-10-CM

## 2016-10-03 MED ORDER — OMEPRAZOLE 20 MG PO CPDR: 20.0000 mg | DELAYED_RELEASE_CAPSULE | Freq: Every day | ORAL | 1 refills | Status: DC

## 2016-10-03 NOTE — Telephone Encounter (Signed)
Attempted to contact patient to advise medication refill.   Person that answered stated, wrong phone number.

## 2016-11-04 ENCOUNTER — Telehealth: Payer: Self-pay | Admitting: Gastroenterology

## 2016-11-04 NOTE — Telephone Encounter (Signed)
Patient called and her pancrease is swollen and needs an anti-inflamatory medication and pain meds if possible. On the pain (0-10 she is at 6/7).  She is at work. Please call meds into [facility]. Please call patient.

## 2016-11-10 ENCOUNTER — Encounter: Payer: Self-pay | Admitting: Gastroenterology

## 2016-11-10 ENCOUNTER — Ambulatory Visit (INDEPENDENT_AMBULATORY_CARE_PROVIDER_SITE_OTHER): Payer: Managed Care, Other (non HMO) | Admitting: Gastroenterology

## 2016-11-10 ENCOUNTER — Other Ambulatory Visit
Admission: RE | Admit: 2016-11-10 | Discharge: 2016-11-10 | Disposition: A | Discharge status: Home / Self Care | Payer: Managed Care, Other (non HMO) | Source: Ambulatory Visit | Attending: Gastroenterology | Admitting: Gastroenterology

## 2016-11-10 VITALS — BP 137/93 | HR 106 | Temp 97.5°F | Ht 61.0 in | Wt 157.6 lb

## 2016-11-10 DIAGNOSIS — K862 Cyst of pancreas: Secondary | ICD-10-CM

## 2016-11-10 DIAGNOSIS — K219 Gastro-esophageal reflux disease without esophagitis: Secondary | ICD-10-CM

## 2016-11-10 DIAGNOSIS — K852 Alcohol induced acute pancreatitis without necrosis or infection: Secondary | ICD-10-CM

## 2016-11-10 LAB — LIPID PANEL
Cholesterol: 170 mg/dL (ref 0–200)
HDL: 35 mg/dL — ABNORMAL LOW (ref >40)
LDL Cholesterol: 75 mg/dL (ref 0–99)
TRIGLYCERIDES: 302 mg/dL — AB (ref <150)
Total CHOL/HDL Ratio: 4.9 RATIO
VLDL: 60 mg/dL — ABNORMAL HIGH (ref 0–40)

## 2016-11-10 LAB — PROTIME-INR
INR: 1.02
PROTHROMBIN TIME: 13.4 s (ref 11.4–15.2)

## 2016-11-10 LAB — LIPASE, BLOOD: Lipase: 28 U/L (ref 11–51)

## 2016-11-10 MED ORDER — OMEPRAZOLE 20 MG PO CPDR: 20.0000 mg | DELAYED_RELEASE_CAPSULE | Freq: Every day | ORAL | 1 refills | Status: DC

## 2016-11-10 NOTE — Progress Notes (Signed)
Primary Care Physician: Garlan Fillers, MD  Primary Gastroenterologist:  Dr. Jonathon Bellows   Chief Complaint  Patient presents with  . pancreatic pain    went to urgent care last tuesday    HPI: April Chaney is a 46 y.o. female   Summary of history :  She was initially seen in 06/2016 when referred for alcoholic pancreatitis. She was seen at the ER on 06/17/16 when she presented with abdominal pain for 5 days . A similar episode had occurred a year prior . She underwent a CT scan of the abdomen and was found to have a cystic structure in the tail of the pancreas . Her lipase was elevated at 456. LFT's were normal . Hb 14.4 with no leucocytosis. She was discharged from the ER. She has had an elevated lipase 1 year and 2 years back which were much higher at 524 and 1387. No abnormal Lft's noted in the past. A CT scan in 2015 showed a 13 mm cystic area in the pancreatic body which was described as a possible pseudocyst and features of pancreatitis were seen at the tail but no cyst . Back in 2015 she was seen by [place] , she had been consuming large qty of alcohol .She underwent EUS and FNA of the cyst .. She said she didn't follow up and was told "she was fine".USG in 07/2014 showed no gall stones and fatty infiltration of the liver. Between 2015-2017 no issues with her abdomen .She consumes 2-3 drinks of rum and coke . The day prior to the ER was on vacation and was under a lot of stress and had a few more drinks - probably 6 drinks in total . She has consumed alcohol for many years. She did drink more alcohol in the past .     Interval history 06/2016-10/2016   Did not obtain lab work after last visit She was admitted in 07/2016 with pancreatitis with active alcohol consumption.  MRCP 07/2016 showed features of acute pancreatitis, 1.9 x 1.5 cm cystic lesion at the junction of the body and tail , a second lesion of 1.2 cm seen along the pancreatic tail.   She called our office  recently and requested refill of medications and I advised her to come in to see me for follow up .  She says she had more abdominal pain last week , was seen at urgent care in [place] and since has got much better. Still smoking , Still drinking alcohol . 1 drink a day .   She smokes cigarettes. No family history of pancreatitis or pancreatic cancer.   Since leaving the ER she has been doing well . No issues presently . No herbal medications or weight loss supplements. No new medications.     Current Outpatient Prescriptions  Medication Sig Dispense Refill  . buPROPion (WELLBUTRIN XL) 300 MG 24 hr tablet Take by mouth.    . levonorgestrel (MIRENA) 20 MCG/24HR IUD by Intrauterine route.    . metformin (FORTAMET) 500 MG (OSM) 24 hr tablet Take 500 mg by mouth 2 (two) times daily with a meal.     . Multiple Vitamin (MULTIVITAMIN WITH MINERALS) TABS Take 1 tablet by mouth daily.    . Omega-3 Fatty Acids (FISH OIL PO) Take 1 capsule by mouth daily.    Marland Kitchen omeprazole (PRILOSEC) 20 MG capsule Take 1 capsule (20 mg total) by mouth daily. 90 capsule 1  . Tofacitinib Citrate (XELJANZ PO) Take 1 tablet by  mouth 2 (two) times daily.    . metFORMIN (GLUCOPHAGE-XR) 500 MG 24 hr tablet TAKE 1 TABLET TWICE A DAY WITH MEALS    . ondansetron (ZOFRAN) 4 MG tablet Take 1 tablet (4 mg total) by mouth daily as needed for nausea or vomiting. (Patient not taking: Reported on 11/10/2016) 20 tablet 1  . potassium chloride 20 MEQ TBCR Take 20 mEq by mouth 2 (two) times daily. 8 tablet 0   No current facility-administered medications for this visit.     Allergies as of 11/10/2016  . (No Known Allergies)    ROS:  General: Negative for anorexia, weight loss, fever, chills, fatigue, weakness. ENT: Negative for hoarseness, difficulty swallowing , nasal congestion. CV: Negative for chest pain, angina, palpitations, dyspnea on exertion, peripheral edema.  Respiratory: Negative for dyspnea at rest, dyspnea on  exertion, cough, sputum, wheezing.  GI: See history of present illness. GU:  Negative for dysuria, hematuria, urinary incontinence, urinary frequency, nocturnal urination.  Endo: Negative for unusual weight change.    Physical Examination:   BP (!) 137/93   Pulse (!) 106   Temp 97.5 F (36.4 C) (Oral)   Ht 5\' 1"  (1.549 m)   Wt 157 lb 9.6 oz (71.5 kg)   BMI 29.78 kg/m   General: Well-nourished, well-developed in no acute distress.  Eyes: No icterus. Conjunctivae pink. Mouth: Oropharyngeal mucosa moist and pink , no lesions erythema or exudate. Lungs: Clear to auscultation bilaterally. Non-labored. Heart: Regular rate and rhythm, no murmurs rubs or gallops.  Abdomen: Bowel sounds are normal, nontender, nondistended, no hepatosplenomegaly or masses, no abdominal bruits or hernia , no rebound or guarding.   Extremities: No lower extremity edema. No clubbing or deformities. Neuro: Alert and oriented x 3.  Grossly intact. Skin: Warm and dry, no jaundice.   Psych: Alert and cooperative, normal mood and affect.   Imaging Studies: No results found.  Assessment and Plan:   April Chaney is a 46 y.o. y/o female here to follow up for pancreatitis. This appears to be the third episode of pancreatitis. In the past she has had a pancreatic cyst/pseudocyst which she underwent EUS+FNA. The aspirate was reported as insufficient to report . Her recent bout of pancreatitis was in 05/2016 and CT scan shows a cyst in the tail. Am unsure if this was due to prior episodes of pancreatitis or if the cyst precipitated the pancreatitis. It is very likely that her episodes of pancreatitis were secondary to alcohol . No evidence to suggest gall stone pancreatitis. Since her last visit in 07/2016 has had another few bouts of abdominal pain , has not stopped smoking or drinking alcohol/.   Plan  1. Stop all alcohol and stop cigarette smoking . 2. Check IGG4 levels, lipids, celiac serology . 3.  EUS+FNA  to evaluate the cystic lesions of the pancreas   I have discussed alternative options, risks & benefits,  which include, but are not limited to, bleeding, infection, perforation,respiratory complication & drug reaction.  The patient agrees with this plan & written consent will be obtained.    Dr Jonathon Bellows  MD Follow up in 3-4 months

## 2016-11-11 ENCOUNTER — Other Ambulatory Visit: Payer: Self-pay

## 2016-11-11 ENCOUNTER — Telehealth: Payer: Self-pay

## 2016-11-11 DIAGNOSIS — K862 Cyst of pancreas: Secondary | ICD-10-CM

## 2016-11-11 DIAGNOSIS — K852 Alcohol induced acute pancreatitis without necrosis or infection: Secondary | ICD-10-CM

## 2016-11-11 LAB — IGG 4: IgG, Subclass 4: 4 mg/dL (ref 2–96)

## 2016-11-11 NOTE — Telephone Encounter (Signed)
  Oncology Nurse Navigator Documentation Received referral from Dr. Vicente Males for upper EUS. Called and spoke with Ms. Ramotar who stated that she must have a Monday appointment and would need to go to [employer]. I notified Dr. Georgeann Oppenheim office. Ms. Garnetta Buddy then called back and said she would prefer to have EUS at [facility] but will need a 9 or 10 o'clock appointment for transportation reasons. I cannot confirm this with her until Friday or Monday with Endo. If can be confirmed we can then schedule her for 12/18/16 with Dr. Cephas Darby. She would like to wait and find out if she can have procedure at [facility]. Navigator Location: [facility] (11/11/16 1000)   )Navigator Encounter Type: Telephone (11/11/16 1000) Telephone: Incoming Call;Outgoing Call (11/11/16 1000)                       Barriers/Navigation Needs: Coordination of Care (11/11/16 1000)   Interventions: Coordination of Care (11/11/16 1000)   Coordination of Care: EUS (11/11/16 1000)                  Time Spent with Patient: 30 (11/11/16 1000)

## 2016-11-12 LAB — CELIAC DISEASE PANEL
Endomysial Ab, IgA: NEGATIVE
IgA: 347 mg/dL (ref 87–352)
Tissue Transglutaminase Ab, IgA: <2 U/mL (ref 0–3)

## 2016-11-19 ENCOUNTER — Telehealth: Payer: Self-pay

## 2016-11-19 ENCOUNTER — Other Ambulatory Visit: Payer: Self-pay

## 2016-11-19 NOTE — Telephone Encounter (Signed)
LVM for callback from [facility] for Dr. Owens Loffler to schedule EUS + FNA.

## 2016-11-20 ENCOUNTER — Telehealth: Payer: Self-pay

## 2016-11-20 NOTE — Telephone Encounter (Signed)
-----   Message from Milus Banister, MD sent at 11/20/2016  8:17 AM EDT ----- Regarding: RE: EUS+FNA Referral [facility], She needs upper EUS, radial +/- linear for pancreatic cyst, next available EUS Thursday with MAC.   thanks   ----- Message ----- From: Leontine Locket, CMA Sent: 11/19/2016   2:07 PM To: Milus Banister, MD, Jeoffrey Massed, RN, # Subject: EUS+FNA Referral                               Dr. Vicente Males would like to schedule an EUS+FNA to evaluate the cystic lesions of the pancreas.

## 2016-11-21 NOTE — Telephone Encounter (Signed)
Left message on machine to call back  

## 2016-11-24 ENCOUNTER — Other Ambulatory Visit: Payer: Self-pay

## 2016-11-24 DIAGNOSIS — K862 Cyst of pancreas: Secondary | ICD-10-CM

## 2016-11-24 NOTE — Telephone Encounter (Signed)
EUS scheduled, pt instructed and medications reviewed.  Patient instructions mailed to home.  Patient to call with any questions or concerns.  

## 2016-12-01 ENCOUNTER — Telehealth: Payer: Self-pay | Admitting: Gastroenterology

## 2016-12-01 NOTE — Telephone Encounter (Signed)
Pt states that she needs to have her procedure on [place].  I explained that Dr Ardis Hughs only does EUS procedures in [place].  She states she will call back if she wants to cancel.

## 2016-12-02 ENCOUNTER — Telehealth: Payer: Self-pay | Admitting: Gastroenterology

## 2016-12-02 NOTE — Telephone Encounter (Signed)
Patient wants to schedule her procedure here in [place] with Dr. Vicente Males and not in [place]. Please call patient.

## 2016-12-03 ENCOUNTER — Telehealth: Payer: Self-pay

## 2016-12-03 ENCOUNTER — Telehealth: Payer: Self-pay | Admitting: Gastroenterology

## 2016-12-03 ENCOUNTER — Other Ambulatory Visit: Payer: Self-pay

## 2016-12-03 DIAGNOSIS — K862 Cyst of pancreas: Secondary | ICD-10-CM

## 2016-12-03 NOTE — Telephone Encounter (Signed)
Pt has been re-scheduled for 01/01/17 at 1130 am [facility] she has been re-instructed and all questions answered

## 2016-12-03 NOTE — Telephone Encounter (Signed)
The pt called and left a voice mail that she wants to cancel her EUS for tomorrow.  I see a phone note that she is transferring her care to Dr Vicente Males.

## 2016-12-04 ENCOUNTER — Ambulatory Visit (HOSPITAL_COMMUNITY)
Admission: RE | Admit: 2016-12-04 | Payer: Managed Care, Other (non HMO) | Source: Ambulatory Visit | Admitting: Gastroenterology

## 2016-12-04 SURGERY — UPPER ENDOSCOPIC ULTRASOUND (EUS) RADIAL
Anesthesia: Monitor Anesthesia Care

## 2016-12-30 ENCOUNTER — Encounter (HOSPITAL_COMMUNITY): Payer: Self-pay | Admitting: *Deleted

## 2017-01-01 ENCOUNTER — Ambulatory Visit (HOSPITAL_COMMUNITY): Payer: Managed Care, Other (non HMO) | Admitting: Anesthesiology

## 2017-01-01 ENCOUNTER — Encounter (HOSPITAL_COMMUNITY)
Admission: RE | Disposition: A | Discharge status: Home / Self Care | Payer: Self-pay | Source: Ambulatory Visit | Attending: Gastroenterology

## 2017-01-01 ENCOUNTER — Ambulatory Visit (HOSPITAL_COMMUNITY)
Admission: RE | Admit: 2017-01-01 | Discharge: 2017-01-01 | Disposition: A | Discharge status: Home / Self Care | Payer: Managed Care, Other (non HMO) | Source: Ambulatory Visit | Attending: Gastroenterology | Admitting: Gastroenterology

## 2017-01-01 ENCOUNTER — Encounter (HOSPITAL_COMMUNITY): Payer: Self-pay | Admitting: *Deleted

## 2017-01-01 DIAGNOSIS — E1142 Type 2 diabetes mellitus with diabetic polyneuropathy: Secondary | ICD-10-CM | POA: Diagnosis not present

## 2017-01-01 DIAGNOSIS — I10 Essential (primary) hypertension: Secondary | ICD-10-CM | POA: Diagnosis not present

## 2017-01-01 DIAGNOSIS — Z86010 Personal history of colonic polyps: Secondary | ICD-10-CM | POA: Diagnosis not present

## 2017-01-01 DIAGNOSIS — F17210 Nicotine dependence, cigarettes, uncomplicated: Secondary | ICD-10-CM | POA: Insufficient documentation

## 2017-01-01 DIAGNOSIS — K859 Acute pancreatitis without necrosis or infection, unspecified: Secondary | ICD-10-CM | POA: Insufficient documentation

## 2017-01-01 DIAGNOSIS — E119 Type 2 diabetes mellitus without complications: Secondary | ICD-10-CM | POA: Diagnosis not present

## 2017-01-01 DIAGNOSIS — M1990 Unspecified osteoarthritis, unspecified site: Secondary | ICD-10-CM | POA: Insufficient documentation

## 2017-01-01 DIAGNOSIS — K8689 Other specified diseases of pancreas: Secondary | ICD-10-CM | POA: Diagnosis not present

## 2017-01-01 DIAGNOSIS — Z7984 Long term (current) use of oral hypoglycemic drugs: Secondary | ICD-10-CM | POA: Insufficient documentation

## 2017-01-01 DIAGNOSIS — K219 Gastro-esophageal reflux disease without esophagitis: Secondary | ICD-10-CM | POA: Diagnosis not present

## 2017-01-01 DIAGNOSIS — Z79899 Other long term (current) drug therapy: Secondary | ICD-10-CM | POA: Diagnosis not present

## 2017-01-01 DIAGNOSIS — K862 Cyst of pancreas: Secondary | ICD-10-CM | POA: Diagnosis not present

## 2017-01-01 HISTORY — PX: FINE NEEDLE ASPIRATION: SHX6590

## 2017-01-01 HISTORY — DX: Family history of other specified conditions: Z84.89

## 2017-01-01 HISTORY — DX: Adverse effect of unspecified anesthetic, initial encounter: T41.45XA

## 2017-01-01 HISTORY — PX: EUS: SHX5427

## 2017-01-01 HISTORY — DX: Unspecified osteoarthritis, unspecified site: M19.90

## 2017-01-01 HISTORY — DX: Other complications of anesthesia, initial encounter: T88.59XA

## 2017-01-01 LAB — GLUCOSE, CAPILLARY: Glucose-Capillary: 129 mg/dL — ABNORMAL HIGH (ref 65–99)

## 2017-01-01 SURGERY — FINE NEEDLE ASPIRATION
Anesthesia: Monitor Anesthesia Care

## 2017-01-01 MED ORDER — PROPOFOL 10 MG/ML IV BOLUS
INTRAVENOUS | Status: AC
  Filled 2017-01-01: qty 20

## 2017-01-01 MED ORDER — PROPOFOL 10 MG/ML IV BOLUS: INTRAVENOUS | Status: DC | PRN

## 2017-01-01 MED ORDER — LIDOCAINE 2% (20 MG/ML) 5 ML SYRINGE
INTRAMUSCULAR | Status: AC
  Filled 2017-01-01: qty 5

## 2017-01-01 MED ORDER — LACTATED RINGERS IV SOLN
INTRAVENOUS | Status: DC | PRN
  Administered 2017-01-01: 10:00:00 via INTRAVENOUS

## 2017-01-01 MED ORDER — SODIUM CHLORIDE 0.9 % IV SOLN: INTRAVENOUS | Status: DC

## 2017-01-01 MED ORDER — PROPOFOL 10 MG/ML IV BOLUS
INTRAVENOUS | Status: AC
  Filled 2017-01-01: qty 60

## 2017-01-01 MED ORDER — PROPOFOL 10 MG/ML IV BOLUS
INTRAVENOUS | Status: DC | PRN
  Administered 2017-01-01 (×5): 30 mg via INTRAVENOUS

## 2017-01-01 MED ORDER — PROPOFOL 500 MG/50ML IV EMUL
INTRAVENOUS | Status: DC | PRN
  Administered 2017-01-01: 150 ug/kg/min via INTRAVENOUS

## 2017-01-01 MED ORDER — CIPROFLOXACIN HCL 500 MG PO TABS: 500.0000 mg | ORAL_TABLET | Freq: Two times a day (BID) | ORAL | 0 refills | Status: DC

## 2017-01-01 MED ORDER — CIPROFLOXACIN IN D5W 400 MG/200ML IV SOLN
400.0000 mg | Freq: Once | INTRAVENOUS | Status: AC
  Administered 2017-01-01: 400 mg via INTRAVENOUS

## 2017-01-01 MED ORDER — LIDOCAINE 2% (20 MG/ML) 5 ML SYRINGE
INTRAMUSCULAR | Status: DC | PRN
  Administered 2017-01-01: 60 mg via INTRAVENOUS

## 2017-01-01 MED ORDER — CIPROFLOXACIN IN D5W 400 MG/200ML IV SOLN
INTRAVENOUS | Status: AC
  Filled 2017-01-01: qty 200

## 2017-01-01 NOTE — Anesthesia Preprocedure Evaluation (Signed)
Anesthesia Evaluation  Patient identified by MRN, date of birth, ID band Patient awake    Reviewed: Allergy & Precautions, NPO status , Patient's Chart, lab work & pertinent test results  History of Anesthesia Complications (+) Family history of anesthesia reaction  Airway Mallampati: II       Dental no notable dental hx. (+) Teeth Intact   Pulmonary Current Smoker,    Pulmonary exam normal breath sounds clear to auscultation       Cardiovascular hypertension, Pt. on medications Normal cardiovascular exam Rhythm:Regular Rate:Normal     Neuro/Psych Diabetic peripheral neuropathy  Neuromuscular disease    GI/Hepatic Neg liver ROS, GERD  Medicated and Controlled,Pancreatic cyst Hx/o Pancreatitis   Endo/Other  diabetes, Well Controlled, Type 2, Oral Hypoglycemic Agents  Renal/GU negative Renal ROS  negative genitourinary   Musculoskeletal  (+) Arthritis , Rheumatoid disorders,  S/P Left THR Rheumatoid arthritis   Abdominal   Peds  Hematology negative hematology ROS (+)   Anesthesia Other Findings   Reproductive/Obstetrics                             Anesthesia Physical Anesthesia Plan  ASA: III  Anesthesia Plan: MAC   Post-op Pain Management:    Induction: Intravenous  PONV Risk Score and Plan: 1 and Ondansetron and Propofol  Airway Management Planned: Natural Airway and Nasal Cannula  Additional Equipment:   Intra-op Plan:   Post-operative Plan:   Informed Consent: I have reviewed the patients History and Physical, chart, labs and discussed the procedure including the risks, benefits and alternatives for the proposed anesthesia with the patient or authorized representative who has indicated his/her understanding and acceptance.   Dental advisory given  Plan Discussed with: CRNA, Anesthesiologist and Surgeon  Anesthesia Plan Comments:         Anesthesia Quick  Evaluation

## 2017-01-01 NOTE — Transfer of Care (Signed)
Immediate Anesthesia Transfer of Care Note  Patient: April Chaney  Procedure(s) Performed: Procedure(s): UPPER ENDOSCOPIC ULTRASOUND (EUS) LINEAR (N/A) FINE NEEDLE ASPIRATION  Patient Location: PACU and Endoscopy Unit  Anesthesia Type:MAC  Level of Consciousness: awake, alert  and patient cooperative  Airway & Oxygen Therapy: Patient Spontanous Breathing and Patient connected to nasal cannula oxygen  Post-op Assessment: Report given to RN and Post -op Vital signs reviewed and stable  Post vital signs: Reviewed and stable  Last Vitals:  Vitals:   01/01/17 1005  BP: (!) 161/104  Pulse: 99  Resp: 19  Temp: 37.2 C    Last Pain:  Vitals:   01/01/17 1005  TempSrc: Oral         Complications: No apparent anesthesia complications

## 2017-01-01 NOTE — H&P (Signed)
HPI: This is a 46 yo woman referred by Dr. Vicente Males for EUS evaluation of the pancreas  Chief complaint is abnormal pancreas, well documented etoh related pancreatitis previously. 2016 EUS at [facility] and I'm told "insufficient sample to evaluate"  ROS: complete GI ROS as described in HPI, all other review negative.  Constitutional:  No unintentional weight loss   Past Medical History:  Diagnosis Date  . Arthritis   . Colon polyps   . Complication of anesthesia    woke up during 1 colonscopy 3 yrs ago  . Diabetes mellitus without complication ([place])    type 2  . DM (diabetes mellitus) ([place])   . Family history of adverse reaction to anesthesia    mother slow to awaken after 1 procedure  . GERD (gastroesophageal reflux disease)   . Hypertension    hx of no bp meds for last 11 years after weight loss  . Pancreatitis 3-4 yrs ago and feb 2018  . Presence of artificial hip    has to take antibiotics prior to any procedure-dental, etc  . Ulcer     Past Surgical History:  Procedure Laterality Date  . colonscopy     x 4  . JOINT REPLACEMENT     hip replacement  . STRABISMUS SURGERY  12/12/2011   Procedure: REPAIR STRABISMUS;  Surgeon: Derry Skill, MD;  Location: [facility];  Service: Ophthalmology;  Laterality: Right;  . TOTAL HIP ARTHROPLASTY     bilat    Current Facility-Administered Medications  Medication Dose Route Frequency Provider Last Rate Last Dose  . 0.9 %  sodium chloride infusion   Intravenous Continuous Milus Banister, MD        Allergies as of 12/03/2016  . (No Known Allergies)    Family History  Problem Relation Age of Onset  . Diabetes Mother   . Diabetes Father   . Hypertension Father     Social History   Social History  . Marital status: Legally Separated    Spouse name: N/A  . Number of children: N/A  . Years of education: N/A   Occupational History  . Not on file.   Social History Main Topics  . Smoking status:  Current Some Day Smoker    Packs/day: 0.50  . Smokeless tobacco: Never Used  . Alcohol use Yes     Comment: occ  . Drug use: No  . Sexual activity: Not on file   Other Topics Concern  . Not on file   Social History Narrative  . No narrative on file     Physical Exam: BP (!) 161/104   Pulse 99   Temp 98.9 F (37.2 C) (Oral)   Resp 19   Ht 5\' 1"  (1.549 m)   Wt 157 lb 10.1 oz (71.5 kg)   LMP 12/30/2016   SpO2 97%   BMI 29.78 kg/m  Constitutional: generally well-appearing Psychiatric: alert and oriented x3 Abdomen: soft, nontender, nondistended, no obvious ascites, no peritoneal signs, normal bowel sounds No peripheral edema noted in lower extremities  Assessment and plan: 46 y.o. female with abnormal pancreas  For upper EUS today.  Please see the "Patient Instructions" section for addition details about the plan.  Owens Loffler, MD [facility] 01/01/2017, 10:20 AM

## 2017-01-01 NOTE — Anesthesia Postprocedure Evaluation (Signed)
Anesthesia Post Note  Patient: April Chaney  Procedure(s) Performed: Procedure(s) (LRB): UPPER ENDOSCOPIC ULTRASOUND (EUS) LINEAR (N/A) FINE NEEDLE ASPIRATION     Patient location during evaluation: PACU Anesthesia Type: MAC Level of consciousness: awake and alert and oriented Pain management: pain level controlled Vital Signs Assessment: post-procedure vital signs reviewed and stable Respiratory status: spontaneous breathing, nonlabored ventilation and respiratory function stable Cardiovascular status: stable and blood pressure returned to baseline Postop Assessment: no signs of nausea or vomiting Anesthetic complications: no    Last Vitals:  Vitals:   01/01/17 1005 01/01/17 1150  BP: (!) 161/104 (!) 154/102  Pulse: 99 86  Resp: 19 (!) 21  Temp: 37.2 C 36.7 C    Last Pain:  Vitals:   01/01/17 1150  TempSrc: Oral                 Ruchel Brandenburger A.

## 2017-01-01 NOTE — Anesthesia Procedure Notes (Signed)
Procedure Name: MAC Date/Time: 01/01/2017 10:41 AM Performed by: Dione Booze Pre-anesthesia Checklist: Patient identified, Emergency Drugs available, Suction available and Patient being monitored Patient Re-evaluated:Patient Re-evaluated prior to induction Oxygen Delivery Method: Nasal cannula Placement Confirmation: positive ETCO2

## 2017-01-01 NOTE — Op Note (Signed)
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 Newman Pies 3 years ago for cystic lesion                            (insufficient sample per path report) Providers:                Milus Banister, MD, Laverta [place] RN, RN, Cletis Athens, Technician, Dione Booze, CRNA Referring MD:             Dr. Jonathon Bellows Medicines:                Monitored Anesthesia Care, cipro 400mg  IV Complications:            No immediate complications. Estimated blood loss:                            None. Estimated Blood Loss:     Estimated blood loss: none. Procedure:                Pre-Anesthesia Assessment:                           - Prior to the procedure, a History and Physical                            was performed, and patient medications and                            allergies were reviewed. The patient's tolerance of                            previous anesthesia was also reviewed. The risks                            and benefits of the procedure and the sedation                            options and risks were discussed with the patient.                            All questions were answered, and informed consent  was obtained. Prior Anticoagulants: The patient has                            taken no previous anticoagulant or antiplatelet                            agents. ASA Grade Assessment: II - A patient with                            mild systemic disease. After reviewing the risks                            and benefits, the patient was deemed in                            satisfactory condition to undergo the procedure.                           After obtaining informed consent, the endoscope was                            passed under direct vision. Throughout the                            procedure, the patient's blood pressure, pulse, and                            oxygen saturations were monitored continuously. The                            [id number] ([id number]) scope was introduced through                            the mouth, and advanced to the second part of                            duodenum. The [id number] ([id number]) scope was                            introduced through the mouth, and advanced to the                            second part of duodenum. The upper EUS was                            accomplished without difficulty. The patient                            tolerated the procedure well. Scope In: Scope Out: Findings:      Endoscopic Finding :      The examined esophagus was endoscopically normal.      The entire examined stomach was endoscopically normal.      The examined duodenum was endoscopically normal.      Endosonographic Finding :  1. There is a mixed cystic/solid mass involving the body and tail of the       pancreas. The mass measures 4.5cm maximally The majority of the lesion       is solid, the most proximal (towards the head) aspect of the lesion       seems to contain thick fluid (necrotic?, resolving psueodcyst?). The       lesion was sampled with 3 transgastric passes with a 25 guage EUS FNA       needle. Three passes were made with the 25 gauge needle using a       transgastric approach. A stylet was used. A cytotechnologist was present       to evaluate the adequacy of the specimen. Final cytology results are       pending.      2. The remainder of the pancreas is normal.      3. No peripancreatic adenopathy.      4. CBD was normal, non-dilated.      5. Limited views of liver, spleen,  portal and splenic veins were normal. Impression:               - Mixed cystic, solid mass of the body/tail of                            pancreas. The solid component predominates and                            represents the complete tail of the pancreas.                            Unclear if this is neoplastic or sequella of                            previous acute pancreatitis episodes. Await final                            cytology results. Moderate Sedation:      N/A- Per Anesthesia Care Recommendation:           - Discharge patient to home (ambulatory).                           - Resume regular diet.                           - Please complete cipro 500mg  pill, one pill twice                            daily for 3 days (prescription called in) Procedure Code(s):        --- Professional ---                           [phone], Esophagogastroduodenoscopy, flexible,                            transoral; with transendoscopic ultrasound-guided  intramural or transmural fine needle                            aspiration/biopsy(s), (includes endoscopic                            ultrasound examination limited to the esophagus,                            stomach or duodenum, and adjacent structures) Diagnosis Code(s):        --- Professional ---                           K86.89, Other specified diseases of pancreas                           K86.2, Cyst of pancreas CPT copyright 2016 American Medical Association. All rights reserved. The codes documented in this report are preliminary and upon coder review may  be revised to meet current compliance requirements. Milus Banister, MD 01/01/2017 11:55:17 AM This report has been signed electronically. Number of Addenda: 0

## 2017-01-01 NOTE — Discharge Instructions (Signed)

## 2017-01-02 ENCOUNTER — Encounter (HOSPITAL_COMMUNITY): Payer: Self-pay | Admitting: Gastroenterology

## 2017-01-05 ENCOUNTER — Telehealth: Payer: Self-pay | Admitting: Gastroenterology

## 2017-01-05 NOTE — Telephone Encounter (Signed)
Patient is returning your call.  

## 2017-01-06 ENCOUNTER — Telehealth: Payer: Self-pay

## 2017-01-06 ENCOUNTER — Other Ambulatory Visit: Payer: Self-pay

## 2017-01-06 DIAGNOSIS — D137 Benign neoplasm of endocrine pancreas: Secondary | ICD-10-CM

## 2017-01-06 NOTE — Telephone Encounter (Signed)
See results note. 

## 2017-01-06 NOTE — Telephone Encounter (Signed)
Advised patient of surgery scheduled with Dr. Barry Dienes @ [facility].   Patient calling directly to reschedule 8/3 appointment due to conflicting appointments.

## 2017-02-10 ENCOUNTER — Other Ambulatory Visit: Payer: Self-pay | Admitting: General Surgery

## 2017-02-10 DIAGNOSIS — K862 Cyst of pancreas: Secondary | ICD-10-CM

## 2017-02-14 ENCOUNTER — Encounter (HOSPITAL_COMMUNITY): Payer: Self-pay | Admitting: *Deleted

## 2017-02-14 ENCOUNTER — Ambulatory Visit (HOSPITAL_COMMUNITY)
Admission: EM | Admit: 2017-02-14 | Discharge: 2017-02-14 | Disposition: A | Discharge status: Home / Self Care | Payer: Managed Care, Other (non HMO) | Attending: Family Medicine | Admitting: Family Medicine

## 2017-02-14 DIAGNOSIS — M25512 Pain in left shoulder: Secondary | ICD-10-CM

## 2017-02-14 MED ORDER — CYCLOBENZAPRINE HCL 10 MG PO TABS: 10.0000 mg | ORAL_TABLET | Freq: Two times a day (BID) | ORAL | 0 refills | Status: DC | PRN

## 2017-02-14 MED ORDER — IBUPROFEN 800 MG PO TABS: 800.0000 mg | ORAL_TABLET | Freq: Three times a day (TID) | ORAL | 0 refills | Status: DC

## 2017-02-14 NOTE — ED Triage Notes (Signed)
C/O left shoulder injury x 2 days.  Denies injury.

## 2017-02-14 NOTE — ED Provider Notes (Signed)
[place]   [id number] 02/14/17 Arrival Time: 1844  ASSESSMENT & PLAN:  1. Acute pain of left shoulder     Meds ordered this encounter  Medications  . METHOTREXATE PO    Sig: Take by mouth.  Marland Kitchen ibuprofen (ADVIL,MOTRIN) 800 MG tablet    Sig: Take 1 tablet (800 mg total) by mouth 3 (three) times daily.    Dispense:  21 tablet    Refill:  0    Order Specific Question:   Supervising Provider    Answer:   Vanessa Kick [id number]  . cyclobenzaprine (FLEXERIL) 10 MG tablet    Sig: Take 1 tablet (10 mg total) by mouth 2 (two) times daily as needed for muscle spasms.    Dispense:  20 tablet    Refill:  0    Order Specific Question:   Supervising Provider    Answer:   Vanessa Kick [[id number]]    Reviewed expectations re: course of current medical issues. Questions answered. Outlined signs and symptoms indicating need for more acute intervention. Patient verbalized understanding. After Visit Summary given.   SUBJECTIVE:  Jobeth Ramotar is a 46 y.o. female who presents with complaint of left shoulder w/o injury.  ROS: As per HPI.   OBJECTIVE:  Vitals:   02/14/17 1909  BP: (!) 166/105  Pulse: (!) 101  Resp: 16  Temp: 98.1 F (36.7 C)  TempSrc: Oral  SpO2: 100%     General appearance: alert; no distress Eyes: PERRLA; EOMI; conjunctiva normal HENT: normocephalic; atraumatic; TMs normal; nasal mucosa normal; oral mucosa normal Neck: supple Lungs: clear to auscultation bilaterally Heart: regular rate and rhythm l sounds normal; no masses or organomegaly; no guarding or rebound tenderness Back: no CVA tenderness Extremities: Left shoulder with tenderness with abduction and internal and external rotation.   Past Medical History:  Diagnosis Date  . Arthritis   . Colon polyps   . Complication of anesthesia    woke up during 1 colonscopy 3 yrs ago  . Diabetes mellitus without complication ([place])    type 2  . DM (diabetes mellitus) ([place])   . Family  history of adverse reaction to anesthesia    mother slow to awaken after 1 procedure  . GERD (gastroesophageal reflux disease)   . Hypertension    hx of no bp meds for last 11 years after weight loss  . Pancreatitis 3-4 yrs ago and feb 2018  . Presence of artificial hip    has to take antibiotics prior to any procedure-dental, etc  . Ulcer      has a past medical history of Arthritis; Colon polyps; Complication of anesthesia; Diabetes mellitus without complication ([place]); DM (diabetes mellitus) ([place]); Family history of adverse reaction to anesthesia; GERD (gastroesophageal reflux disease); Hypertension; Pancreatitis (3-4 yrs ago and feb 2018); Presence of artificial hip; and Ulcer.  Results for orders placed or performed during the hospital encounter of 01/01/17  Glucose, capillary  Result Value Ref Range   Glucose-Capillary 129 (H) 65 - 99 mg/dL    Labs Reviewed - No data to display  Imaging: No results found.  No Known Allergies  Family History  Problem Relation Age of Onset  . Diabetes Mother   . Diabetes Father   . Hypertension Father    Past Surgical History:  Procedure Laterality Date  . colonscopy     x 4  . EUS N/A 01/01/2017   Procedure: UPPER ENDOSCOPIC ULTRASOUND (EUS) LINEAR;  Surgeon: Milus Banister, MD;  Location: [facility];  Service: Endoscopy;  Laterality: N/A;  . FINE NEEDLE ASPIRATION  01/01/2017   Procedure: FINE NEEDLE ASPIRATION;  Surgeon: Milus Banister, MD;  Location: [facility] END[facility];  Service: Endoscopy;;  . JOINT REPLACEMENT     hip replacement  . STRABISMUS SURGERY  12/12/2011   Procedure: REPAIR STRABISMUS;  Surgeon: Derry Skill, MD;  Location: [facility];  Service: Ophthalmology;  Laterality: Right;  . TOTAL HIP ARTHROPLASTY     bilat         Lysbeth Penner, FNP 02/14/17 1949

## 2017-03-02 ENCOUNTER — Ambulatory Visit: Payer: Managed Care, Other (non HMO) | Admitting: Gastroenterology

## 2017-03-02 ENCOUNTER — Encounter: Payer: Self-pay | Admitting: Gastroenterology

## 2017-03-02 ENCOUNTER — Ambulatory Visit
Admission: RE | Admit: 2017-03-02 | Discharge: 2017-03-02 | Disposition: A | Discharge status: Home / Self Care | Payer: Managed Care, Other (non HMO) | Source: Ambulatory Visit | Attending: General Surgery | Admitting: General Surgery

## 2017-03-02 DIAGNOSIS — K862 Cyst of pancreas: Secondary | ICD-10-CM

## 2017-03-18 NOTE — Progress Notes (Signed)
Please let patient know mass is smaller.  Would get repeat MR/MRCP in 1 year.

## 2018-11-14 ENCOUNTER — Other Ambulatory Visit: Payer: Self-pay

## 2018-11-14 ENCOUNTER — Emergency Department
Admission: EM | Admit: 2018-11-14 | Discharge: 2018-11-14 | Disposition: A | Discharge status: Home / Self Care | Payer: BLUE CROSS/BLUE SHIELD | Attending: Emergency Medicine | Admitting: Emergency Medicine

## 2018-11-14 ENCOUNTER — Emergency Department: Payer: BLUE CROSS/BLUE SHIELD

## 2018-11-14 DIAGNOSIS — Z7984 Long term (current) use of oral hypoglycemic drugs: Secondary | ICD-10-CM | POA: Insufficient documentation

## 2018-11-14 DIAGNOSIS — E119 Type 2 diabetes mellitus without complications: Secondary | ICD-10-CM | POA: Diagnosis not present

## 2018-11-14 DIAGNOSIS — R1011 Right upper quadrant pain: Secondary | ICD-10-CM

## 2018-11-14 DIAGNOSIS — Z96641 Presence of right artificial hip joint: Secondary | ICD-10-CM | POA: Diagnosis not present

## 2018-11-14 DIAGNOSIS — F17210 Nicotine dependence, cigarettes, uncomplicated: Secondary | ICD-10-CM | POA: Diagnosis not present

## 2018-11-14 DIAGNOSIS — M069 Rheumatoid arthritis, unspecified: Secondary | ICD-10-CM | POA: Insufficient documentation

## 2018-11-14 DIAGNOSIS — E1140 Type 2 diabetes mellitus with diabetic neuropathy, unspecified: Secondary | ICD-10-CM | POA: Diagnosis not present

## 2018-11-14 DIAGNOSIS — Z79899 Other long term (current) drug therapy: Secondary | ICD-10-CM | POA: Insufficient documentation

## 2018-11-14 DIAGNOSIS — K2900 Acute gastritis without bleeding: Secondary | ICD-10-CM

## 2018-11-14 LAB — CBC WITH DIFFERENTIAL/PLATELET
Abs Immature Granulocytes: 0.03 10*3/uL (ref 0.00–0.07)
Basophils Absolute: 0 10*3/uL (ref 0.0–0.1)
Basophils Relative: 0 %
Eosinophils Absolute: 0.1 10*3/uL (ref 0.0–0.5)
Eosinophils Relative: 1 %
HCT: 45.5 % (ref 36.0–46.0)
Hemoglobin: 14.8 g/dL (ref 12.0–15.0)
Immature Granulocytes: 0 %
Lymphocytes Relative: 11 %
Lymphs Abs: 1 10*3/uL (ref 0.7–4.0)
MCH: 28.8 pg (ref 26.0–34.0)
MCHC: 32.5 g/dL (ref 30.0–36.0)
MCV: 88.5 fL (ref 80.0–100.0)
Monocytes Absolute: 0.7 10*3/uL (ref 0.1–1.0)
Monocytes Relative: 8 %
Neutro Abs: 7.3 10*3/uL (ref 1.7–7.7)
Neutrophils Relative %: 80 %
Platelets: 215 10*3/uL (ref 150–400)
RBC: 5.14 MIL/uL — ABNORMAL HIGH (ref 3.87–5.11)
RDW: 14.1 % (ref 11.5–15.5)
WBC: 9.1 10*3/uL (ref 4.0–10.5)
nRBC: 0 % (ref 0.0–0.2)

## 2018-11-14 LAB — BASIC METABOLIC PANEL WITH GFR
Anion gap: 10 (ref 5–15)
BUN: 9 mg/dL (ref 6–20)
CO2: 23 mmol/L (ref 22–32)
Calcium: 8.4 mg/dL — ABNORMAL LOW (ref 8.9–10.3)
Chloride: 108 mmol/L (ref 98–111)
Creatinine, Ser: 0.58 mg/dL (ref 0.44–1.00)
GFR calc Af Amer: >60 mL/min
GFR calc non Af Amer: >60 mL/min
Glucose, Bld: 124 mg/dL — ABNORMAL HIGH (ref 70–99)
Potassium: 4 mmol/L (ref 3.5–5.1)
Sodium: 141 mmol/L (ref 135–145)

## 2018-11-14 LAB — HEPATIC FUNCTION PANEL
ALT: 16 U/L (ref 0–44)
AST: 25 U/L (ref 15–41)
Albumin: 4.1 g/dL (ref 3.5–5.0)
Alkaline Phosphatase: 64 U/L (ref 38–126)
Bilirubin, Direct: 0.1 mg/dL (ref 0.0–0.2)
Indirect Bilirubin: 0.5 mg/dL (ref 0.3–0.9)
Total Bilirubin: 0.6 mg/dL (ref 0.3–1.2)
Total Protein: 7.5 g/dL (ref 6.5–8.1)

## 2018-11-14 LAB — LIPASE, BLOOD: Lipase: 36 U/L (ref 11–51)

## 2018-11-14 LAB — URINALYSIS, COMPLETE (UACMP) WITH MICROSCOPIC
Bilirubin Urine: NEGATIVE
Glucose, UA: NEGATIVE mg/dL
Ketones, ur: NEGATIVE mg/dL
Leukocytes,Ua: NEGATIVE
Nitrite: POSITIVE — AB
Protein, ur: NEGATIVE mg/dL
Specific Gravity, Urine: 1.016 (ref 1.005–1.030)
pH: 6 (ref 5.0–8.0)

## 2018-11-14 MED ORDER — IOHEXOL 240 MG/ML SOLN
50.0000 mL | Freq: Once | INTRAMUSCULAR | Status: AC
  Administered 2018-11-14: 50 mL via ORAL

## 2018-11-14 MED ORDER — IOHEXOL 300 MG/ML  SOLN
100.0000 mL | Freq: Once | INTRAMUSCULAR | Status: AC | PRN
  Administered 2018-11-14: 13:00:00 100 mL via INTRAVENOUS

## 2018-11-14 MED ORDER — SODIUM CHLORIDE 0.9 % IV BOLUS
1000.0000 mL | Freq: Once | INTRAVENOUS | Status: AC
  Administered 2018-11-14: 1000 mL via INTRAVENOUS

## 2018-11-14 MED ORDER — MORPHINE SULFATE (PF) 4 MG/ML IV SOLN
4.0000 mg | Freq: Once | INTRAVENOUS | Status: AC
  Administered 2018-11-14: 4 mg via INTRAVENOUS
  Filled 2018-11-14: qty 1

## 2018-11-14 MED ORDER — FAMOTIDINE IN NACL 20-0.9 MG/50ML-% IV SOLN
20.0000 mg | Freq: Once | INTRAVENOUS | Status: AC
  Administered 2018-11-14: 20 mg via INTRAVENOUS
  Filled 2018-11-14: qty 50

## 2018-11-14 MED ORDER — ONDANSETRON HCL 4 MG/2ML IJ SOLN
4.0000 mg | Freq: Once | INTRAMUSCULAR | Status: AC
  Administered 2018-11-14: 4 mg via INTRAVENOUS
  Filled 2018-11-14: qty 2

## 2018-11-14 MED ORDER — CEPHALEXIN 500 MG PO CAPS: 500.0000 mg | ORAL_CAPSULE | Freq: Two times a day (BID) | ORAL | 0 refills | Status: AC

## 2018-11-14 MED ORDER — TRAMADOL HCL 50 MG PO TABS: 50.0000 mg | ORAL_TABLET | Freq: Four times a day (QID) | ORAL | 0 refills | Status: AC | PRN

## 2018-11-14 MED ORDER — METOCLOPRAMIDE HCL 10 MG PO TABS: 10.0000 mg | ORAL_TABLET | Freq: Three times a day (TID) | ORAL | 0 refills | Status: DC | PRN

## 2018-11-14 NOTE — ED Notes (Signed)
PT states unable to provide urine sample

## 2018-11-14 NOTE — ED Notes (Signed)
Pt given food tray, crackers, gingerale verbal okay from [place].

## 2018-11-14 NOTE — ED Triage Notes (Signed)
Pt c/o RUQ pain since Wednesday, went to [facility] today and referred for possible gall bladder. Denies N/V/D/or constipation

## 2018-11-14 NOTE — Discharge Instructions (Signed)
Start taking your Prilosec and continue taking it every day at least for the next several weeks.  We have also prescribed some tramadol if needed for more severe pain, and Reglan for nausea.  We have provided contact information for two area gastroenterologists for follow-up.  Return to the ER for new, worsening, or persistent pain, vomiting, fever, weakness, or any other new or worsening symptoms that concern you.

## 2018-11-14 NOTE — ED Notes (Signed)
US to bedside

## 2018-11-14 NOTE — ED Provider Notes (Signed)
[facility] Emergency Department Provider Note ____________________________________________   First MD Initiated Contact with Patient 11/14/18 [phone]     (approximate)  I have reviewed the triage vital signs and the nursing notes.   HISTORY  Chief Complaint Abdominal Pain    HPI April Chaney is a 48 y.o. female with PMH as noted below who presents with right upper quadrant abdominal pain for the last 4 days, worsening over the last day, and not associated with nausea or vomiting or any fever.  The patient denies any prior history of similar pain.  She states that it has been constant today.  She was evaluated in urgent care and sent to the ED for further work-up.   Past Medical History:  Diagnosis Date  . Arthritis   . Colon polyps   . Complication of anesthesia    woke up during 1 colonscopy 3 yrs ago  . Diabetes mellitus without complication ([place])    type 2  . DM (diabetes mellitus) ([place])   . Family history of adverse reaction to anesthesia    mother slow to awaken after 1 procedure  . GERD (gastroesophageal reflux disease)   . Hypertension    hx of no bp meds for last 11 years after weight loss  . Pancreatitis 3-4 yrs ago and feb 2018  . Presence of artificial hip    has to take antibiotics prior to any procedure-dental, etc  . Ulcer     Patient Active Problem List   Diagnosis Date Noted  . Epigastric pain   . Alcohol-induced acute pancreatitis   . Abdominal pain 08/03/2016  . Encounter for health maintenance examination 10/22/2015  . Rheumatoid arthritis involving multiple sites with positive rheumatoid factor ([place]) 10/22/2015  . BMI 35.0-35.9,adult 09/23/2015  . Elevated rheumatoid factor 09/23/2015  . History of adenomatous polyp of colon 09/23/2015  . Alopecia 08/18/2015  . Benign essential hypertension 08/18/2015  . Benign neoplasm of colon 08/18/2015  . Epigastric discomfort 08/18/2015  . GERD without esophagitis  08/18/2015  . Heartburn 08/18/2015  . High risk medication use 08/18/2015  . Hyperlipidemia 08/18/2015  . Hyponatremia 08/18/2015  . Infective otitis externa of left ear 08/18/2015  . Insomnia 08/18/2015  . Neuropathy of both feet 08/18/2015  . Nicotine dependence 08/18/2015  . Oligomenorrhea 08/18/2015  . Obesity 08/18/2015  . Gastro-esophageal reflux disease with esophagitis 08/18/2015  . Stress incontinence in female 08/18/2015  . Type 2 diabetes, uncontrolled, with neuropathy ([place]) 08/18/2015  . Uncontrolled diabetes mellitus with complications ([place]) 02/58/5277  . Vitamin D deficiency 08/18/2015  . Wheezing 08/18/2015  . Colon polyps 01/06/2014  . Pancreatic mass 01/06/2014  . Pancreatitis 01/06/2014    Past Surgical History:  Procedure Laterality Date  . colonscopy     x 4  . EUS N/A 01/01/2017   Procedure: UPPER ENDOSCOPIC ULTRASOUND (EUS) LINEAR;  Surgeon: Milus Banister, MD;  Location: [facility];  Service: Endoscopy;  Laterality: N/A;  . FINE NEEDLE ASPIRATION  01/01/2017   Procedure: FINE NEEDLE ASPIRATION;  Surgeon: Milus Banister, MD;  Location: [facility]OS[facility];  Service: Endoscopy;;  . JOINT REPLACEMENT     hip replacement  . STRABISMUS SURGERY  12/12/2011   Procedure: REPAIR STRABISMUS;  Surgeon: Derry Skill, MD;  Location: [facility];  Service: Ophthalmology;  Laterality: Right;  . TOTAL HIP ARTHROPLASTY     bilat    Prior to Admission medications   Medication Sig Start Date End Date Taking? Authorizing Provider  ARTIFICIAL TEAR OP Apply 1 drop to eye daily as needed (dry eyes).    [provider]  buPROPion (WELLBUTRIN XL) 300 MG 24 hr tablet Take 300 mg by mouth daily.  01/03/16   [provider]  cholecalciferol (VITAMIN D) 1000 units tablet Take 2,000 Units by mouth daily.    [provider]  ciprofloxacin (CIPRO) 500 MG tablet Take 1 tablet (500 mg total) by mouth 2 (two) times daily. 01/01/17   Milus Banister, MD  cyclobenzaprine (FLEXERIL) 10 MG tablet Take 1 tablet (10 mg total) by mouth 2 (two) times daily as needed for muscle spasms. 02/14/17   Lysbeth Penner, FNP  diphenhydramine-acetaminophen (TYLENOL PM EXTRA STRENGTH) 25-500 MG TABS tablet Take 2 tablets by mouth at bedtime as needed (sleep).    [provider]  folic acid (FOLVITE) 1 MG tablet Take 3 mg by mouth daily.    [provider]  HYDROcodone-acetaminophen (NORCO/VICODIN) 5-325 MG tablet TK 1 T PO Q 6 TO 8 H PNR P 02/22/17   [provider]  ibuprofen (ADVIL,MOTRIN) 200 MG tablet Take 400 mg by mouth 3 (three) times daily as needed for headache or moderate pain.    [provider]  ibuprofen (ADVIL,MOTRIN) 800 MG tablet Take 1 tablet (800 mg total) by mouth 3 (three) times daily. 02/14/17   Lysbeth Penner, FNP  levonorgestrel (MIRENA) 20 MCG/24HR IUD 1 each by Intrauterine route once.     [provider]  metFORMIN (GLUCOPHAGE-XR) 500 MG 24 hr tablet Take 500mg s by mouth twice daily with meals 07/24/16   [provider]  METHOTREXATE PO Take by mouth.    [provider]  metoCLOPramide (REGLAN) 10 MG tablet Take 1 tablet (10 mg total) by mouth every 8 (eight) hours as needed for up to 5 days for nausea. 11/14/18 11/19/18  Arta Silence, MD  moxifloxacin (VIGAMOX) 0.5 % ophthalmic solution INT 1 GTT IN OU TID FOR 7 DAYS 02/17/17   [provider]  Multiple Vitamin (MULTIVITAMIN WITH MINERALS) TABS Take 1 tablet by mouth daily.    [provider]  naproxen (NAPROSYN) 500 MG tablet TK 1 T PO BID 02/22/17   [provider]  omeprazole (PRILOSEC) 20 MG capsule Take 1 capsule (20 mg total) by mouth daily. Patient taking differently: Take 20 mg by mouth daily as needed (acid reflux).  11/10/16 01/10/17  Jonathon Bellows, MD  orphenadrine (NORFLEX) 100 MG tablet TK 1 T PO BID PRN 02/22/17   [provider]  Potassium 99 MG TABS Take 99 mg by mouth daily.     [provider]  predniSONE (DELTASONE) 10 MG tablet TK 2 TS PO D 02/22/17   [provider]  Prenatal Vit-Fe Fumarate-FA (PRENATAL PO) Take 1 tablet by mouth daily as needed (vitamin supplementation).    [provider]  Tofacitinib Citrate (XELJANZ) 5 MG TABS Take 5 mg by mouth 2 (two) times daily.     [provider]  traMADol (ULTRAM) 50 MG tablet Take 1 tablet (50 mg total) by mouth every 6 (six) hours as needed for up to 5 days for severe pain. 11/14/18 11/19/18  Arta Silence, MD  Turmeric 500 MG TABS Take 500 mg by mouth daily.    [provider]    Allergies Patient has no known allergies.  Family History  Problem Relation Age of Onset  . Diabetes Mother   . Diabetes Father   . Hypertension Father  Social History Social History   Tobacco Use  . Smoking status: Current Some Day Smoker    Packs/day: 0.50  . Smokeless tobacco: Never Used  Substance Use Topics  . Alcohol use: Yes    Comment: occ  . Drug use: No    Review of Systems  Constitutional: No fever. Eyes: No redness. ENT: No sore throat. Cardiovascular: Denies chest pain. Respiratory: Denies shortness of breath. Gastrointestinal: Positive for abdominal pain.  No vomiting or diarrhea. Genitourinary: Negative for dysuria.  Musculoskeletal: Negative for back pain. Skin: Negative for rash. Neurological: Negative for headache.   ____________________________________________   PHYSICAL EXAM:  VITAL SIGNS: ED Triage Vitals  Enc Vitals Group     BP 11/14/18 0926 (!) 172/117     Pulse Rate 11/14/18 0926 (!) 120     Resp 11/14/18 0926 17     Temp 11/14/18 0929 98.5 F (36.9 C)     Temp Source 11/14/18 0926 Oral     SpO2 11/14/18 0926 100 %     Weight 11/14/18 0926 156 lb (70.8 kg)     Height 11/14/18 0926 5' (1.524 m)     Head Circumference --      Peak Flow --      Pain Score 11/14/18 0926 10     Pain Loc --      Pain Edu? --      Excl. in [place]?  --     Constitutional: Alert and oriented.  Uncomfortable appearing but in no acute distress. Eyes: Conjunctivae are normal.  Head: Atraumatic. Nose: No congestion/rhinnorhea. Mouth/Throat: Mucous membranes are moist.   Neck: Normal range of motion.  Cardiovascular: Tachycardic, regular rhythm. Good peripheral circulation. Respiratory: Normal respiratory effort.  No retractions. Gastrointestinal: Soft with moderate right upper quadrant tenderness.  No distention.  Genitourinary: No flank tenderness. Musculoskeletal: Extremities warm and well perfused.  Neurologic:  Normal speech and language. No gross focal neurologic deficits are appreciated.  Skin:  Skin is warm and dry. No rash noted. Psychiatric: Mood and affect are normal. Speech and behavior are normal.  ____________________________________________   LABS (all labs ordered are listed, but only abnormal results are displayed)  Labs Reviewed  CBC WITH DIFFERENTIAL/PLATELET - Abnormal; Notable for the following components:      Result Value   RBC 5.14 (*)    All other components within normal limits  URINALYSIS, COMPLETE (UACMP) WITH MICROSCOPIC - Abnormal; Notable for the following components:   Color, Urine YELLOW (*)    APPearance HAZY (*)    Hgb urine dipstick SMALL (*)    Nitrite POSITIVE (*)    Bacteria, UA RARE (*)    All other components within normal limits  BASIC METABOLIC PANEL - Abnormal; Notable for the following components:   Glucose, Bld 124 (*)    Calcium 8.4 (*)    All other components within normal limits  HEPATIC FUNCTION PANEL  LIPASE, BLOOD   ____________________________________________  EKG   ____________________________________________  RADIOLOGY  US abdomen RUQ: No acute abnormality CT abdomen: Findings compatible with gastritis.  No other acute abnormality  ____________________________________________   PROCEDURES  Procedure(s) performed: No  Procedures  Critical Care  performed: No ____________________________________________   INITIAL IMPRESSION / ASSESSMENT AND PLAN / ED COURSE  Pertinent labs & imaging results that were available during my care of the patient were reviewed by me and considered in my medical decision making (see chart for details).  48 year old female with PMH as noted above presents with right upper quadrant  abdominal pain for the last several days which acutely worsened today.  She has no associated vomiting or fever.  I reviewed the past medical records in Epic; the patient has a history of a pancreatic cyst which is being followed but has no other hepatobiliary disease.  On exam she is slightly uncomfortable but overall well-appearing.  Her vital signs are normal except for tachycardia and hypertension.  She has moderate right upper quadrant abdominal tenderness.  Differential includes cholecystitis, biliary colic, pancreatitis, or other hepatobiliary cause.  If there are no hepatobiliary findings differential may also include colitis or diverticulitis.  We will obtain lab work-up, ultrasound, and reassess.  ----------------------------------------- 3:45 PM on 11/14/2018 -----------------------------------------  Lab work-up is unremarkable.  UA shows findings compatible with UTI.  The right upper quadrant ultrasound was negative, but CT shows findings consistent with gastritis.  The patient is feeling significantly better.  She is tolerating p.o. and ate a small meal.  She wants to go home.  On reassessment she is still mildly tachycardic, but appears very comfortable.  I counseled her on the results of the work-up.  I will also treat her for the UTI.  The patient has taken omeprazole in the past but recently was off of it, so I instructed her to restart it and I will write for some pain and nausea medication as well.  Return precautions given, and she expresses understanding. ____________________________________________   FINAL  CLINICAL IMPRESSION(S) / ED DIAGNOSES  Final diagnoses:  Right upper quadrant abdominal pain  Acute gastritis without hemorrhage, unspecified gastritis type      NEW MEDICATIONS STARTED DURING THIS VISIT:  New Prescriptions   METOCLOPRAMIDE (REGLAN) 10 MG TABLET    Take 1 tablet (10 mg total) by mouth every 8 (eight) hours as needed for up to 5 days for nausea.   TRAMADOL (ULTRAM) 50 MG TABLET    Take 1 tablet (50 mg total) by mouth every 6 (six) hours as needed for up to 5 days for severe pain.     Note:  This document was prepared using Dragon voice recognition software and may include unintentional dictation errors.    Arta Silence, MD 11/14/18 [phone]

## 2018-11-14 NOTE — ED Notes (Signed)
Patient assisted to use restroom. Ambulatory with standby assistance. Reports worsening pain with ambulation and movement.

## 2019-01-03 ENCOUNTER — Other Ambulatory Visit: Admission: RE | Admit: 2019-01-03 | Payer: BLUE CROSS/BLUE SHIELD | Source: Ambulatory Visit

## 2019-02-08 ENCOUNTER — Other Ambulatory Visit
Admission: RE | Admit: 2019-02-08 | Discharge: 2019-02-08 | Disposition: A | Discharge status: Home / Self Care | Payer: BC Managed Care – PPO | Source: Ambulatory Visit | Attending: Gastroenterology | Admitting: Gastroenterology

## 2019-02-08 ENCOUNTER — Other Ambulatory Visit: Payer: Self-pay

## 2019-02-08 DIAGNOSIS — Z01812 Encounter for preprocedural laboratory examination: Secondary | ICD-10-CM | POA: Insufficient documentation

## 2019-02-08 DIAGNOSIS — Z20828 Contact with and (suspected) exposure to other viral communicable diseases: Secondary | ICD-10-CM | POA: Insufficient documentation

## 2019-02-09 LAB — SARS CORONAVIRUS 2 (TAT 6-24 HRS): SARS Coronavirus 2: NEGATIVE

## 2019-02-11 ENCOUNTER — Encounter
Admission: RE | Disposition: A | Discharge status: Home / Self Care | Payer: Self-pay | Source: Home / Self Care | Attending: Gastroenterology

## 2019-02-11 ENCOUNTER — Ambulatory Visit: Payer: BC Managed Care – PPO | Admitting: Certified Registered Nurse Anesthetist

## 2019-02-11 ENCOUNTER — Ambulatory Visit
Admission: RE | Admit: 2019-02-11 | Discharge: 2019-02-11 | Disposition: A | Discharge status: Home / Self Care | Payer: BC Managed Care – PPO | Attending: Gastroenterology | Admitting: Gastroenterology

## 2019-02-11 ENCOUNTER — Other Ambulatory Visit: Payer: Self-pay

## 2019-02-11 DIAGNOSIS — K621 Rectal polyp: Secondary | ICD-10-CM | POA: Insufficient documentation

## 2019-02-11 DIAGNOSIS — Z79899 Other long term (current) drug therapy: Secondary | ICD-10-CM | POA: Diagnosis not present

## 2019-02-11 DIAGNOSIS — I10 Essential (primary) hypertension: Secondary | ICD-10-CM | POA: Diagnosis not present

## 2019-02-11 DIAGNOSIS — R933 Abnormal findings on diagnostic imaging of other parts of digestive tract: Secondary | ICD-10-CM | POA: Insufficient documentation

## 2019-02-11 DIAGNOSIS — F17200 Nicotine dependence, unspecified, uncomplicated: Secondary | ICD-10-CM | POA: Insufficient documentation

## 2019-02-11 DIAGNOSIS — Z7984 Long term (current) use of oral hypoglycemic drugs: Secondary | ICD-10-CM | POA: Insufficient documentation

## 2019-02-11 DIAGNOSIS — K2980 Duodenitis without bleeding: Secondary | ICD-10-CM | POA: Diagnosis not present

## 2019-02-11 DIAGNOSIS — K449 Diaphragmatic hernia without obstruction or gangrene: Secondary | ICD-10-CM | POA: Insufficient documentation

## 2019-02-11 DIAGNOSIS — E1142 Type 2 diabetes mellitus with diabetic polyneuropathy: Secondary | ICD-10-CM | POA: Insufficient documentation

## 2019-02-11 DIAGNOSIS — K210 Gastro-esophageal reflux disease with esophagitis: Secondary | ICD-10-CM | POA: Insufficient documentation

## 2019-02-11 DIAGNOSIS — K635 Polyp of colon: Secondary | ICD-10-CM | POA: Diagnosis not present

## 2019-02-11 DIAGNOSIS — K2960 Other gastritis without bleeding: Secondary | ICD-10-CM | POA: Insufficient documentation

## 2019-02-11 DIAGNOSIS — R1011 Right upper quadrant pain: Secondary | ICD-10-CM | POA: Insufficient documentation

## 2019-02-11 DIAGNOSIS — Z7952 Long term (current) use of systemic steroids: Secondary | ICD-10-CM | POA: Insufficient documentation

## 2019-02-11 DIAGNOSIS — Z86010 Personal history of colonic polyps: Secondary | ICD-10-CM | POA: Diagnosis not present

## 2019-02-11 DIAGNOSIS — D120 Benign neoplasm of cecum: Secondary | ICD-10-CM | POA: Diagnosis not present

## 2019-02-11 DIAGNOSIS — Z975 Presence of (intrauterine) contraceptive device: Secondary | ICD-10-CM | POA: Insufficient documentation

## 2019-02-11 HISTORY — PX: ESOPHAGOGASTRODUODENOSCOPY (EGD) WITH PROPOFOL: SHX5813

## 2019-02-11 HISTORY — PX: COLONOSCOPY WITH PROPOFOL: SHX5780

## 2019-02-11 LAB — GLUCOSE, CAPILLARY: Glucose-Capillary: 139 mg/dL — ABNORMAL HIGH (ref 70–99)

## 2019-02-11 LAB — POCT PREGNANCY, URINE: Preg Test, Ur: NEGATIVE

## 2019-02-11 SURGERY — COLONOSCOPY WITH PROPOFOL
Anesthesia: General

## 2019-02-11 MED ORDER — PHENYLEPHRINE HCL (PRESSORS) 10 MG/ML IV SOLN
INTRAVENOUS | Status: AC
  Filled 2019-02-11: qty 1

## 2019-02-11 MED ORDER — PROPOFOL 500 MG/50ML IV EMUL
INTRAVENOUS | Status: AC
  Filled 2019-02-11: qty 50

## 2019-02-11 MED ORDER — PROPOFOL 500 MG/50ML IV EMUL
INTRAVENOUS | Status: DC | PRN
  Administered 2019-02-11: 150 ug/kg/min via INTRAVENOUS

## 2019-02-11 MED ORDER — LIDOCAINE HCL (PF) 2 % IJ SOLN
INTRAMUSCULAR | Status: AC
  Filled 2019-02-11: qty 10

## 2019-02-11 MED ORDER — PROPOFOL 10 MG/ML IV BOLUS
INTRAVENOUS | Status: AC
  Filled 2019-02-11: qty 20

## 2019-02-11 MED ORDER — PHENYLEPHRINE HCL (PRESSORS) 10 MG/ML IV SOLN
INTRAVENOUS | Status: DC | PRN
  Administered 2019-02-11 (×2): 100 ug via INTRAVENOUS

## 2019-02-11 MED ORDER — PROPOFOL 10 MG/ML IV BOLUS
INTRAVENOUS | Status: DC | PRN
  Administered 2019-02-11: 30 mg via INTRAVENOUS
  Administered 2019-02-11: 50 mg via INTRAVENOUS
  Administered 2019-02-11: 20 mg via INTRAVENOUS

## 2019-02-11 MED ORDER — SODIUM CHLORIDE 0.9 % IV SOLN
INTRAVENOUS | Status: DC
  Administered 2019-02-11: 10:00:00 via INTRAVENOUS

## 2019-02-11 MED ORDER — MIDAZOLAM HCL 2 MG/2ML IJ SOLN
INTRAMUSCULAR | Status: DC | PRN
  Administered 2019-02-11 (×2): 1 mg via INTRAVENOUS

## 2019-02-11 MED ORDER — LIDOCAINE HCL (CARDIAC) PF 100 MG/5ML IV SOSY
PREFILLED_SYRINGE | INTRAVENOUS | Status: DC | PRN
  Administered 2019-02-11: 80 mg via INTRATRACHEAL

## 2019-02-11 MED ORDER — MIDAZOLAM HCL 2 MG/2ML IJ SOLN
INTRAMUSCULAR | Status: AC
  Filled 2019-02-11: qty 2

## 2019-02-11 NOTE — Transfer of Care (Signed)
Immediate Anesthesia Transfer of Care Note  Patient: April Chaney  Procedure(s) Performed: COLONOSCOPY WITH PROPOFOL (N/A ) ESOPHAGOGASTRODUODENOSCOPY (EGD) WITH PROPOFOL (N/A )  Patient Location: PACU  Anesthesia Type:General  Level of Consciousness: awake, alert  and oriented  Airway & Oxygen Therapy: Patient Spontanous Breathing and Patient connected to face mask oxygen  Post-op Assessment: Report given to RN, Post -op Vital signs reviewed and stable and Patient moving all extremities X 4  Post vital signs: Reviewed and stable  Last Vitals:  Vitals Value Taken Time  BP 139/97 02/11/19 1124  Temp 36.1 C 02/11/19 1118  Pulse 94 02/11/19 1125  Resp 23 02/11/19 1125  SpO2 100 % 02/11/19 1125  Vitals shown include unvalidated device data.  Last Pain:  Vitals:   02/11/19 1118  TempSrc: Tympanic  PainSc:          Complications: No apparent anesthesia complications

## 2019-02-11 NOTE — Op Note (Signed)
[facility] Gastroenterology Patient Name: April Chaney Procedure Date: 02/11/2019 10:10 AM MRN: [id number] Account #: [id number] Date of Birth: 14-Jul-1970 Admit Type: Outpatient Age: 48 Room: [facility] ENDO ROOM 2 Gender: Female Note Status: Finalized Procedure:            Upper GI endoscopy Indications:          Abdominal pain in the right upper quadrant, Abnormal CT                        of the GI tract Providers:            Lollie Sails, MD Referring MD:         Precious Bard, MD (Referring MD) Medicines:            Monitored Anesthesia Care Complications:        No immediate complications. Procedure:            Pre-Anesthesia Assessment:                       - ASA Grade Assessment: II - A patient with mild                        systemic disease.                       After obtaining informed consent, the endoscope was                        passed under direct vision. Throughout the procedure,                        the patient's blood pressure, pulse, and oxygen                        saturations were monitored continuously. The Endoscope                        was introduced through the mouth, and advanced to the                        third part of duodenum. The upper GI endoscopy was                        accomplished without difficulty. The patient tolerated                        the procedure well. Findings:      The Z-line was variable. Biopsies were taken with a cold forceps for       histology.      A small hiatal hernia was found. The Z-line was a variable distance from       incisors; the hiatal hernia was sliding.      Diffuse mild inflammation characterized by adherent blood, congestion       (edema) and erythema was found in the gastric body. Biopsies were taken       with a cold forceps for histology. Biopsies were taken with a cold       forceps for Helicobacter pylori testing.      Mild inflammation characterized by  congestion (edema) and erythema was  found in the gastric antrum. Biopsies were taken with a cold forceps for       histology. Biopsies were taken with a cold forceps for Helicobacter       pylori testing. No evidence of gastric wall thickening.      Patchy minimal inflammation characterized by erythema was found in the       duodenal bulb.      Biopsies were taken with a cold forceps in the second portion of the       duodenum and in the third portion of the duodenum for histology.      The cardia and gastric fundus were normal on retroflexion otherwise. Impression:           - Z-line variable. Biopsied.                       - Small hiatal hernia.                       - Erosive gastritis. Biopsied.                       - Gastritis. Biopsied.                       - Duodenitis.                       - Biopsies were taken with a cold forceps for histology                        in the second portion of the duodenum and in the third                        portion of the duodenum. Recommendation:       - Use Prilosec (omeprazole) 20 mg PO BID daily.                       - Await pathology results.                       - Return to GI clinic in 3 weeks. Procedure Code(s):    --- Professional ---                       [phone], Esophagogastroduodenoscopy, flexible, transoral;                        with biopsy, single or multiple Diagnosis Code(s):    --- Professional ---                       K22.8, Other specified diseases of esophagus                       K44.9, Diaphragmatic hernia without obstruction or                        gangrene                       K29.60, Other gastritis without bleeding                       K29.70, Gastritis, unspecified, without  bleeding                       K29.80, Duodenitis without bleeding                       R10.11, Right upper quadrant pain                       R93.3, Abnormal findings on diagnostic imaging of other                         parts of digestive tract CPT copyright 2019 [employer]. All rights reserved. The codes documented in this report are preliminary and upon coder review may  be revised to meet current compliance requirements. Lollie Sails, MD 02/11/2019 10:48:04 AM This report has been signed electronically. Number of Addenda: 0 Note Initiated On: 02/11/2019 10:10 AM      [facility]

## 2019-02-11 NOTE — Anesthesia Post-op Follow-up Note (Signed)
Anesthesia QCDR form completed.        

## 2019-02-11 NOTE — Anesthesia Preprocedure Evaluation (Signed)
Anesthesia Evaluation  Patient identified by MRN, date of birth, ID band Patient awake    Reviewed: Allergy & Precautions, H&P , NPO status , Patient's Chart, lab work & pertinent test results  History of Anesthesia Complications (+) AWARENESS UNDER ANESTHESIA and history of anesthetic complications ("woke up during last colonoscopy")  Airway Mallampati: II  TM Distance: >3 FB Neck ROM: full    Dental  (+) Teeth Intact   Pulmonary neg pulmonary ROS, Current Smoker,           Cardiovascular hypertension,      Neuro/Psych Peripheral neuropathy negative psych ROS   GI/Hepatic Neg liver ROS, GERD  Controlled,  Endo/Other  diabetes  Renal/GU negative Renal ROS  negative genitourinary   Musculoskeletal   Abdominal   Peds  Hematology negative hematology ROS (+)   Anesthesia Other Findings Obese  Past Medical History: No date: Arthritis No date: Colon polyps No date: Complication of anesthesia     Comment:  woke up during 1 colonscopy 3 yrs ago No date: Diabetes mellitus without complication (HCC)     Comment:  type 2 No date: DM (diabetes mellitus) (HCC) No date: Family history of adverse reaction to anesthesia     Comment:  mother slow to awaken after 1 procedure No date: GERD (gastroesophageal reflux disease) No date: Hypertension     Comment:  hx of no bp meds for last 11 years after weight loss 3-4 yrs ago and feb 2018: Pancreatitis No date: Presence of artificial hip     Comment:  has to take antibiotics prior to any procedure-dental,               etc No date: Ulcer  Past Surgical History: No date: colonscopy     Comment:  x 4 01/01/2017: EUS; N/A     Comment:  Procedure: UPPER ENDOSCOPIC ULTRASOUND (EUS) LINEAR;                Surgeon: Milus Banister, MD;  Location: [facility];                Service: Endoscopy;  Laterality: N/A; 01/01/2017: FINE NEEDLE ASPIRATION     Comment:  Procedure: FINE  NEEDLE ASPIRATION;  Surgeon: Milus Banister, MD;  Location: [facility];  Service:               Endoscopy;; No date: JOINT REPLACEMENT     Comment:  hip replacement 12/12/2011: STRABISMUS SURGERY     Comment:  Procedure: REPAIR STRABISMUS;  Surgeon: Derry Skill,              MD;  Location: [facility];  Service:               Ophthalmology;  Laterality: Right; No date: TOTAL HIP ARTHROPLASTY     Comment:  bilat  BMI    Body Mass Index: 31.25 kg/m      Reproductive/Obstetrics negative OB ROS                             Anesthesia Physical Anesthesia Plan  ASA: II  Anesthesia Plan: General   Post-op Pain Management:    Induction:   PONV Risk Score and Plan: Propofol infusion and TIVA  Airway Management Planned: Natural Airway and Nasal Cannula  Additional Equipment:   Intra-op Plan:   Post-operative Plan:  Informed Consent: I have reviewed the patients History and Physical, chart, labs and discussed the procedure including the risks, benefits and alternatives for the proposed anesthesia with the patient or authorized representative who has indicated his/her understanding and acceptance.     Dental Advisory Given  Plan Discussed with: Anesthesiologist and CRNA  Anesthesia Plan Comments:         Anesthesia Quick Evaluation

## 2019-02-11 NOTE — H&P (Signed)
Outpatient short stay form Pre-procedure 02/11/2019 10:12 AM Lollie Sails MD  Primary Physician: Dr. Addison Naegeli  Reason for visit: EGD and colonoscopy  History of present illness: Patient is a 48 year old female presenting today for EGD and colonoscopy in regards to right upper quadrant pain and abnormal CT scan of the abdomen.  Has a personal history of colon polyps.  About 2 months ago 3 months ago patient developed worsening right upper quadrant abdominal pain having in nature.  Not seen be related with meals.  He did not have any nausea vomiting or reflux.  Was taking Advil near daily.  Is also drinking a large amount of coffee at the time.  Also has a personal history of adenomatous colon polyps and is overdue for her 5-year follow-up.  CT scan on 11/14/2018 showing a "wall thickening in portions of the distal body and antrum of the stomach felt to represent gastritis.  No ulceration by CT evident in those areas.  The large and small bowel appeared normal.  She previously had a pancreatic cyst which appears to have resolved.  She was taking omeprazole 20 mg daily and was changed to Protonix 40 mg twice a day.  She states that since starting the Protonix she began to have a rash.  Remains of this are noted on her arm that she points out to me almost like target lesions appearance.  She tolerated her prep well.  She takes no aspirin or blood thinning agent.  She had been prescribed ibuprofen 400 mg 3 times a day as well as ibuprofen 800 mg tablet 3 times a day in the past for her hip pain.  She has a history of bilateral total hip replacements.    Current Facility-Administered Medications:  .  0.9 %  sodium chloride infusion, , Intravenous, Continuous, Lollie Sails, MD, Last Rate: 20 mL/hr at 02/11/19 [id number]  Medications Prior to Admission  Medication Sig Dispense Refill Last Dose  . buPROPion (WELLBUTRIN XL) 300 MG 24 hr tablet Take 300 mg by mouth daily.    Past Week at Unknown time   . cholecalciferol (VITAMIN D) 1000 units tablet Take 2,000 Units by mouth daily.   Past Week at Unknown time  . cyclobenzaprine (FLEXERIL) 10 MG tablet Take 1 tablet (10 mg total) by mouth 2 (two) times daily as needed for muscle spasms. 20 tablet 0 Past Week at Unknown time  . diphenhydramine-acetaminophen (TYLENOL PM EXTRA STRENGTH) 25-500 MG TABS tablet Take 2 tablets by mouth at bedtime as needed (sleep).   Past Week at Unknown time  . folic acid (FOLVITE) 1 MG tablet Take 3 mg by mouth daily.   Past Week at Unknown time  . ibuprofen (ADVIL,MOTRIN) 200 MG tablet Take 400 mg by mouth 3 (three) times daily as needed for headache or moderate pain.   Past Week at Unknown time  . ibuprofen (ADVIL,MOTRIN) 800 MG tablet Take 1 tablet (800 mg total) by mouth 3 (three) times daily. 21 tablet 0 Past Week at Unknown time  . lisinopril (ZESTRIL) 10 MG tablet Take 5 mg by mouth daily.   02/10/2019 at Unknown time  . metFORMIN (GLUCOPHAGE-XR) 500 MG 24 hr tablet Take 500mg s by mouth twice daily with meals   Past Week at Unknown time  . METHOTREXATE PO Take by mouth.   Past Week at Unknown time  . Multiple Vitamin (MULTIVITAMIN WITH MINERALS) TABS Take 1 tablet by mouth daily.   Past Week at Unknown time  . naproxen (  NAPROSYN) 500 MG tablet TK 1 T PO BID  0 Past Week at Unknown time  . pantoprazole (PROTONIX) 40 MG tablet Take 40 mg by mouth 2 (two) times daily before a meal.   02/10/2019 at Unknown time  . Potassium 99 MG TABS Take 99 mg by mouth daily.   Past Week at Unknown time  . Tofacitinib Citrate (XELJANZ) 5 MG TABS Take 5 mg by mouth 2 (two) times daily.    Past Week at Unknown time  . Turmeric 500 MG TABS Take 500 mg by mouth daily.   Past Week at Unknown time  . ARTIFICIAL TEAR OP Apply 1 drop to eye daily as needed (dry eyes).     . ciprofloxacin (CIPRO) 500 MG tablet Take 1 tablet (500 mg total) by mouth 2 (two) times daily. (Patient not taking: Reported on 02/10/2019) 6 tablet 0 Not Taking at  Unknown time  . HYDROcodone-acetaminophen (NORCO/VICODIN) 5-325 MG tablet TK 1 T PO Q 6 TO 8 H PNR P  0 Not Taking at Unknown time  . levonorgestrel (MIRENA) 20 MCG/24HR IUD 1 each by Intrauterine route once.      . metoCLOPramide (REGLAN) 10 MG tablet Take 1 tablet (10 mg total) by mouth every 8 (eight) hours as needed for up to 5 days for nausea. 10 tablet 0   . moxifloxacin (VIGAMOX) 0.5 % ophthalmic solution INT 1 GTT IN OU TID FOR 7 DAYS  0   . omeprazole (PRILOSEC) 20 MG capsule Take 1 capsule (20 mg total) by mouth daily. (Patient taking differently: Take 20 mg by mouth daily as needed (acid reflux). ) 90 capsule 1   . orphenadrine (NORFLEX) 100 MG tablet TK 1 T PO BID PRN  0 Not Taking at Unknown time  . predniSONE (DELTASONE) 10 MG tablet TK 2 TS PO D  0 Not Taking at Unknown time  . Prenatal Vit-Fe Fumarate-FA (PRENATAL PO) Take 1 tablet by mouth daily as needed (vitamin supplementation).   Not Taking at Unknown time     No Known Allergies   Past Medical History:  Diagnosis Date  . Arthritis   . Colon polyps   . Complication of anesthesia    woke up during 1 colonscopy 3 yrs ago  . Diabetes mellitus without complication ([place])    type 2  . DM (diabetes mellitus) ([place])   . Family history of adverse reaction to anesthesia    mother slow to awaken after 1 procedure  . GERD (gastroesophageal reflux disease)   . Hypertension    hx of no bp meds for last 11 years after weight loss  . Pancreatitis 3-4 yrs ago and feb 2018  . Presence of artificial hip    has to take antibiotics prior to any procedure-dental, etc  . Ulcer     Review of systems:      Physical Exam    Heart and lungs: Regular rate and rhythm without rub or gallop lungs are bilaterally clear    HEENT: Normocephalic atraumatic eyes are anicteric    Other: Note comments re: rash above.    Pertinant exam for procedure: Soft nontender nondistended bowel sounds positive normoactive    Planned proceedures:  EGD, colonoscopy and indicated procedures. I have discussed the risks benefits and complications of procedures to include not limited to bleeding, infection, perforation and the risk of sedation and the patient wishes to proceed.    Lollie Sails, MD Gastroenterology 02/11/2019  10:12 AM

## 2019-02-11 NOTE — Anesthesia Postprocedure Evaluation (Signed)
Anesthesia Post Note  Patient: Kourtney Ramotar  Procedure(s) Performed: COLONOSCOPY WITH PROPOFOL (N/A ) ESOPHAGOGASTRODUODENOSCOPY (EGD) WITH PROPOFOL (N/A )  Patient location during evaluation: PACU Anesthesia Type: General Level of consciousness: awake and alert Pain management: pain level controlled Vital Signs Assessment: post-procedure vital signs reviewed and stable Respiratory status: spontaneous breathing, nonlabored ventilation and respiratory function stable Cardiovascular status: blood pressure returned to baseline and stable Postop Assessment: no apparent nausea or vomiting Anesthetic complications: no     Last Vitals:  Vitals:   02/11/19 1124 02/11/19 1141  BP: (!) 139/97   Pulse: 93 90  Resp: (!) 21 20  Temp:    SpO2: 100% 99%    Last Pain:  Vitals:   02/11/19 1118  TempSrc: Tympanic  PainSc:                  Durenda Hurt

## 2019-02-11 NOTE — Op Note (Signed)
[facility] Gastroenterology Patient Name: April Chaney Procedure Date: 02/11/2019 10:09 AM MRN: [id number] Account #: [id number] Date of Birth: [birth year] Admit Type: Outpatient Age: 48 Room: [facility] ENDO ROOM 2 Gender: Female Note Status: Finalized Procedure:            Colonoscopy Indications:          Personal history of colonic polyps Providers:            Lollie Sails, MD Medicines:            Monitored Anesthesia Care Complications:        No immediate complications. Procedure:            Pre-Anesthesia Assessment:                       - ASA Grade Assessment: II - A patient with mild                        systemic disease.                       After obtaining informed consent, the colonoscope was                        passed under direct vision. Throughout the procedure,                        the patient's blood pressure, pulse, and oxygen                        saturations were monitored continuously. The                        Colonoscope was introduced through the anus and                        advanced to the the cecum, identified by appendiceal                        orifice and ileocecal valve. The quality of the bowel                        preparation was fair except the ascending colon was                        poor. Findings:      A 4 mm polyp was found in the rectum. The polyp was sessile. The polyp       was removed with a piecemeal technique using a cold biopsy forceps.       Resection and retrieval were complete.      A 3 mm polyp was found in the cecum. The polyp was sessile. The polyp       was removed with a cold biopsy forceps. Resection and retrieval were       complete.      A 6 mm polyp was found in the ascending colon. The polyp was sessile.       The polyp was removed with a cold snare. Resection and retrieval were       complete.      The exam was otherwise without abnormality. Impression:           -  One 4 mm  polyp in the rectum, removed piecemeal using                        a cold biopsy forceps. Resected and retrieved.                       - One 3 mm polyp in the cecum, removed with a cold                        biopsy forceps. Resected and retrieved.                       - One 6 mm polyp in the ascending colon, removed with a                        cold snare. Resected and retrieved.                       - The examination was otherwise normal. Recommendation:       - Discharge patient to home.                       - Soft diet today, then advance as tolerated to advance                        diet as tolerated. Procedure Code(s):    --- Professional ---                       [phone], Colonoscopy, flexible; with removal of tumor(s),                        polyp(s), or other lesion(s) by snare technique                       [id number], 66, Colonoscopy, flexible; with biopsy, single                        or multiple Diagnosis Code(s):    --- Professional ---                       K62.1, Rectal polyp                       K63.5, Polyp of colon                       Z86.010, Personal history of colonic polyps CPT copyright 2019 [employer]. All rights reserved. The codes documented in this report are preliminary and upon coder review may  be revised to meet current compliance requirements. Lollie Sails, MD 02/11/2019 11:20:30 AM This report has been signed electronically. Number of Addenda: 0 Note Initiated On: 02/11/2019 10:09 AM Scope Withdrawal Time: 0 hours 11 minutes 49 seconds  Total Procedure Duration: 0 hours 21 minutes 30 seconds       [facility]

## 2019-02-14 ENCOUNTER — Encounter: Payer: Self-pay | Admitting: Gastroenterology

## 2019-02-14 LAB — SURGICAL PATHOLOGY

## 2019-02-17 ENCOUNTER — Emergency Department: Payer: BC Managed Care – PPO

## 2019-02-17 ENCOUNTER — Encounter: Payer: Self-pay | Admitting: Emergency Medicine

## 2019-02-17 ENCOUNTER — Emergency Department
Admission: EM | Admit: 2019-02-17 | Discharge: 2019-02-17 | Disposition: A | Discharge status: Home / Self Care | Payer: BC Managed Care – PPO | Attending: Student | Admitting: Student

## 2019-02-17 ENCOUNTER — Other Ambulatory Visit: Payer: Self-pay

## 2019-02-17 DIAGNOSIS — I10 Essential (primary) hypertension: Secondary | ICD-10-CM | POA: Diagnosis not present

## 2019-02-17 DIAGNOSIS — M0689 Other specified rheumatoid arthritis, multiple sites: Secondary | ICD-10-CM | POA: Insufficient documentation

## 2019-02-17 DIAGNOSIS — E119 Type 2 diabetes mellitus without complications: Secondary | ICD-10-CM | POA: Insufficient documentation

## 2019-02-17 DIAGNOSIS — F17210 Nicotine dependence, cigarettes, uncomplicated: Secondary | ICD-10-CM | POA: Insufficient documentation

## 2019-02-17 DIAGNOSIS — Z96643 Presence of artificial hip joint, bilateral: Secondary | ICD-10-CM | POA: Diagnosis not present

## 2019-02-17 DIAGNOSIS — R079 Chest pain, unspecified: Secondary | ICD-10-CM

## 2019-02-17 DIAGNOSIS — Z7984 Long term (current) use of oral hypoglycemic drugs: Secondary | ICD-10-CM | POA: Insufficient documentation

## 2019-02-17 DIAGNOSIS — Z79899 Other long term (current) drug therapy: Secondary | ICD-10-CM | POA: Diagnosis not present

## 2019-02-17 LAB — CBC
HCT: 38.9 % (ref 36.0–46.0)
Hemoglobin: 12.7 g/dL (ref 12.0–15.0)
MCH: 29.3 pg (ref 26.0–34.0)
MCHC: 32.6 g/dL (ref 30.0–36.0)
MCV: 89.6 fL (ref 80.0–100.0)
Platelets: 209 10*3/uL (ref 150–400)
RBC: 4.34 MIL/uL (ref 3.87–5.11)
RDW: 13.9 % (ref 11.5–15.5)
WBC: 8.2 10*3/uL (ref 4.0–10.5)
nRBC: 0 % (ref 0.0–0.2)

## 2019-02-17 LAB — BASIC METABOLIC PANEL
Anion gap: 11 (ref 5–15)
BUN: 7 mg/dL (ref 6–20)
CO2: 26 mmol/L (ref 22–32)
Calcium: 9 mg/dL (ref 8.9–10.3)
Chloride: 102 mmol/L (ref 98–111)
Creatinine, Ser: 0.71 mg/dL (ref 0.44–1.00)
GFR calc Af Amer: >60 mL/min (ref >60)
GFR calc non Af Amer: >60 mL/min (ref >60)
Glucose, Bld: 119 mg/dL — ABNORMAL HIGH (ref 70–99)
Potassium: 3.4 mmol/L — ABNORMAL LOW (ref 3.5–5.1)
Sodium: 139 mmol/L (ref 135–145)

## 2019-02-17 LAB — HEPATIC FUNCTION PANEL
ALT: 14 U/L (ref 0–44)
AST: 26 U/L (ref 15–41)
Albumin: 3.9 g/dL (ref 3.5–5.0)
Alkaline Phosphatase: 66 U/L (ref 38–126)
Bilirubin, Direct: 0.2 mg/dL (ref 0.0–0.2)
Indirect Bilirubin: 0.6 mg/dL (ref 0.3–0.9)
Total Bilirubin: 0.8 mg/dL (ref 0.3–1.2)
Total Protein: 7.6 g/dL (ref 6.5–8.1)

## 2019-02-17 LAB — TROPONIN I (HIGH SENSITIVITY)
Troponin I (High Sensitivity): 17 ng/L (ref <18)
Troponin I (High Sensitivity): 16 ng/L (ref <18)

## 2019-02-17 LAB — FIBRIN DERIVATIVES D-DIMER (ARMC ONLY): Fibrin derivatives D-dimer (ARMC): 623.91 ng/mL (FEU) — ABNORMAL HIGH (ref 0.00–499.00)

## 2019-02-17 LAB — LIPASE, BLOOD: Lipase: 29 U/L (ref 11–51)

## 2019-02-17 MED ORDER — IOHEXOL 350 MG/ML SOLN
75.0000 mL | Freq: Once | INTRAVENOUS | Status: AC | PRN
  Administered 2019-02-17: 75 mL via INTRAVENOUS

## 2019-02-17 MED ORDER — SODIUM CHLORIDE 0.9 % IV BOLUS
1000.0000 mL | Freq: Once | INTRAVENOUS | Status: AC
  Administered 2019-02-17: 1000 mL via INTRAVENOUS

## 2019-02-17 MED ORDER — ASPIRIN 81 MG PO CHEW
324.0000 mg | CHEWABLE_TABLET | Freq: Once | ORAL | Status: AC
  Administered 2019-02-17: 324 mg via ORAL
  Filled 2019-02-17: qty 4

## 2019-02-17 MED ORDER — SODIUM CHLORIDE 0.9% FLUSH: 3.0000 mL | Freq: Once | INTRAVENOUS | Status: DC

## 2019-02-17 NOTE — ED Notes (Signed)
Patient ambulatory to lobby with steady gait and NAD noted. Verbalized understanding of discharge instructions and follow-up care.  

## 2019-02-17 NOTE — Discharge Instructions (Addendum)
Thank you for letting us take care of you in the emergency department today.   Please continue to take your regular, prescribed medications.   Please follow up with: - A cardiology for further evaluation, we have given you information for one here - Your primary care doctor to review your ER visit and follow up on your symptoms.   Please return to the ER for any new or worsening symptoms.

## 2019-02-17 NOTE — ED Notes (Signed)
Patient transported to CT 

## 2019-02-17 NOTE — ED Provider Notes (Signed)
[facility] Emergency Department Provider Note  ____________________________________________   First MD Initiated Contact with Patient 02/17/19 [phone]     (approximate)  I have reviewed the triage vital signs and the nursing notes.  History  Chief Complaint Chest Pain    HPI April Chaney is a 48 y.o. female with history of HTN, DM, tobacco use who presents to the emergency department for chest pain.  Symptoms first started yesterday.  Pain is sharp and central in location.  She initially thought her symptoms were related to gas and took some over the counter medications with some relief, but when her symptoms were still present this morning she presented to urgent care who referred her here.  She reports some mild associated shortness of breath.  Symptoms do not change with exertion.  No nausea or vomiting or diaphoresis.  No history of DVT or PE.  No prolonged immobilization.  No recent illnesses.         Past Medical Hx Past Medical History:  Diagnosis Date  . Arthritis   . Colon polyps   . Complication of anesthesia    woke up during 1 colonscopy 3 yrs ago  . Diabetes mellitus without complication ([place])    type 2  . DM (diabetes mellitus) ([place])   . Family history of adverse reaction to anesthesia    mother slow to awaken after 1 procedure  . GERD (gastroesophageal reflux disease)   . Hypertension    hx of no bp meds for last 11 years after weight loss  . Pancreatitis 3-4 yrs ago and feb 2018  . Presence of artificial hip    has to take antibiotics prior to any procedure-dental, etc  . Ulcer     Problem List Patient Active Problem List   Diagnosis Date Noted  . Epigastric pain   . Alcohol-induced acute pancreatitis   . Abdominal pain 08/03/2016  . Encounter for health maintenance examination 10/22/2015  . Rheumatoid arthritis involving multiple sites with positive rheumatoid factor ([place]) 10/22/2015  . BMI 35.0-35.9,adult 09/23/2015   . Elevated rheumatoid factor 09/23/2015  . History of adenomatous polyp of colon 09/23/2015  . Alopecia 08/18/2015  . Benign essential hypertension 08/18/2015  . Benign neoplasm of colon 08/18/2015  . Epigastric discomfort 08/18/2015  . GERD without esophagitis 08/18/2015  . Heartburn 08/18/2015  . High risk medication use 08/18/2015  . Hyperlipidemia 08/18/2015  . Hyponatremia 08/18/2015  . Infective otitis externa of left ear 08/18/2015  . Insomnia 08/18/2015  . Neuropathy of both feet 08/18/2015  . Nicotine dependence 08/18/2015  . Oligomenorrhea 08/18/2015  . Obesity 08/18/2015  . Gastro-esophageal reflux disease with esophagitis 08/18/2015  . Stress incontinence in female 08/18/2015  . Type 2 diabetes, uncontrolled, with neuropathy ([place]) 08/18/2015  . Uncontrolled diabetes mellitus with complications ([place]) 99991111  . Vitamin D deficiency 08/18/2015  . Wheezing 08/18/2015  . Colon polyps 01/06/2014  . Pancreatic mass 01/06/2014  . Pancreatitis 01/06/2014    Past Surgical Hx Past Surgical History:  Procedure Laterality Date  . COLONOSCOPY WITH PROPOFOL N/A 02/11/2019   Procedure: COLONOSCOPY WITH PROPOFOL;  Surgeon: Lollie Sails, MD;  Location: [facility] ENDOSCOPY;  Service: Endoscopy;  Laterality: N/A;  . colonscopy     x 4  . ESOPHAGOGASTRODUODENOSCOPY (EGD) WITH PROPOFOL N/A 02/11/2019   Procedure: ESOPHAGOGASTRODUODENOSCOPY (EGD) WITH PROPOFOL;  Surgeon: Lollie Sails, MD;  Location: [facility] ENDOSCOPY;  Service: Endoscopy;  Laterality: N/A;  . EUS N/A 01/01/2017   Procedure: UPPER ENDOSCOPIC ULTRASOUND (  EUS) LINEAR;  Surgeon: Milus Banister, MD;  Location: [facility] ENDOSCOPY;  Service: Endoscopy;  Laterality: N/A;  . FINE NEEDLE ASPIRATION  01/01/2017   Procedure: FINE NEEDLE ASPIRATION;  Surgeon: Milus Banister, MD;  Location: [facility] ENDOSCOPY;  Service: Endoscopy;;  . JOINT REPLACEMENT     hip replacement  . STRABISMUS SURGERY  12/12/2011   Procedure: REPAIR  STRABISMUS;  Surgeon: Derry Skill, MD;  Location: [facility];  Service: Ophthalmology;  Laterality: Right;  . TOTAL HIP ARTHROPLASTY     bilat    Medications Prior to Admission medications   Medication Sig Start Date End Date Taking? Authorizing Provider  ARTIFICIAL TEAR OP Apply 1 drop to eye daily as needed (dry eyes).    [provider]  buPROPion (WELLBUTRIN XL) 300 MG 24 hr tablet Take 300 mg by mouth daily.  01/03/16   [provider]  cholecalciferol (VITAMIN D) 1000 units tablet Take 2,000 Units by mouth daily.    [provider]  ciprofloxacin (CIPRO) 500 MG tablet Take 1 tablet (500 mg total) by mouth 2 (two) times daily. Patient not taking: Reported on 02/10/2019 01/01/17   Milus Banister, MD  cyclobenzaprine (FLEXERIL) 10 MG tablet Take 1 tablet (10 mg total) by mouth 2 (two) times daily as needed for muscle spasms. 02/14/17   Lysbeth Penner, FNP  diphenhydramine-acetaminophen (TYLENOL PM EXTRA STRENGTH) 25-500 MG TABS tablet Take 2 tablets by mouth at bedtime as needed (sleep).    [provider]  folic acid (FOLVITE) 1 MG tablet Take 3 mg by mouth daily.    [provider]  HYDROcodone-acetaminophen (NORCO/VICODIN) 5-325 MG tablet TK 1 T PO Q 6 TO 8 H PNR P 02/22/17   [provider]  ibuprofen (ADVIL,MOTRIN) 200 MG tablet Take 400 mg by mouth 3 (three) times daily as needed for headache or moderate pain.    [provider]  ibuprofen (ADVIL,MOTRIN) 800 MG tablet Take 1 tablet (800 mg total) by mouth 3 (three) times daily. 02/14/17   Lysbeth Penner, FNP  levonorgestrel (MIRENA) 20 MCG/24HR IUD 1 each by Intrauterine route once.     [provider]  lisinopril (ZESTRIL) 10 MG tablet Take 5 mg by mouth daily.    [provider]  metFORMIN (GLUCOPHAGE-XR) 500 MG 24 hr tablet Take 500mg s by mouth twice daily with meals 07/24/16   [provider]  METHOTREXATE PO Take by mouth.     [provider]  metoCLOPramide (REGLAN) 10 MG tablet Take 1 tablet (10 mg total) by mouth every 8 (eight) hours as needed for up to 5 days for nausea. 11/14/18 11/19/18  Arta Silence, MD  moxifloxacin (VIGAMOX) 0.5 % ophthalmic solution INT 1 GTT IN OU TID FOR 7 DAYS 02/17/17   [provider]  Multiple Vitamin (MULTIVITAMIN WITH MINERALS) TABS Take 1 tablet by mouth daily.    [provider]  naproxen (NAPROSYN) 500 MG tablet TK 1 T PO BID 02/22/17   [provider]  omeprazole (PRILOSEC) 20 MG capsule Take 1 capsule (20 mg total) by mouth daily. Patient taking differently: Take 20 mg by mouth daily as needed (acid reflux).  11/10/16 01/10/17  Jonathon Bellows, MD  orphenadrine (NORFLEX) 100 MG tablet TK 1 T PO BID PRN 02/22/17   [provider]  pantoprazole (PROTONIX) 40 MG tablet Take 40 mg by mouth 2 (two) times daily before a meal.    [provider]  Potassium 99 MG  TABS Take 99 mg by mouth daily.    [provider]  predniSONE (DELTASONE) 10 MG tablet TK 2 TS PO D 02/22/17   [provider]  Prenatal Vit-Fe Fumarate-FA (PRENATAL PO) Take 1 tablet by mouth daily as needed (vitamin supplementation).    [provider]  Tofacitinib Citrate (XELJANZ) 5 MG TABS Take 5 mg by mouth 2 (two) times daily.     [provider]  Turmeric 500 MG TABS Take 500 mg by mouth daily.    [provider]    Allergies Patient has no known allergies.  Family Hx Family History  Problem Relation Age of Onset  . Diabetes Mother   . Diabetes Father   . Hypertension Father     Social Hx Social History   Tobacco Use  . Smoking status: Current Some Day Smoker    Packs/day: 0.50  . Smokeless tobacco: Never Used  Substance Use Topics  . Alcohol use: Yes    Comment: occ  . Drug use: No     Review of Systems  Constitutional: Negative for fever. Negative for chills. Eyes: Negative for visual changes. ENT:  Negative for sore throat. Cardiovascular: + for chest pain. Respiratory: + for shortness of breath. Gastrointestinal: Negative for abdominal pain. Negative for nausea. Negative for vomiting. Genitourinary: Negative for dysuria. Musculoskeletal: Negative for leg swelling. Skin: Negative for rash. Neurological: Negative for for headaches.   Physical Exam  Vital Signs: ED Triage Vitals  Enc Vitals Group     BP 02/17/19 0922 (!) 138/98     Pulse Rate 02/17/19 0922 (!) 107     Resp 02/17/19 0922 16     Temp 02/17/19 0922 98.7 F (37.1 C)     Temp Source 02/17/19 0922 Oral     SpO2 02/17/19 0922 98 %     Weight 02/17/19 0916 159 lb 13.3 oz (72.5 kg)     Height 02/17/19 0916 5' (1.524 m)     Head Circumference --      Peak Flow --      Pain Score 02/17/19 0916 5     Pain Loc --      Pain Edu? --      Excl. in [place]? --     Constitutional: Alert and oriented.  Eyes: Conjunctivae clear. Sclera anicteric. Head: Normocephalic. Atraumatic. Nose: No congestion. No rhinorrhea. Mouth/Throat: Mucous membranes are moist.  Neck: No stridor.   Cardiovascular: Heart rate low 100s, regular rhythm. No murmurs. Extremities well perfused. Respiratory: Normal respiratory effort.  Lungs CTAB. Gastrointestinal: Soft and non-tender. No distention.  Musculoskeletal: No lower extremity edema. Neurologic:  Normal speech and language. No gross focal neurologic deficits are appreciated.  Skin: Skin is warm, dry and intact. No rash noted. Psychiatric: Mood and affect are appropriate for situation.  EKG  Personally reviewed.   Rate: 112 Rhythm: sinus Axis: normal Intervals: QTc 532 ms No STEMI    Radiology  IMPRESSION: Negative for pulmonary embolus. Negative chest CT.    Procedures  Procedure(s) performed (including critical care):  Procedures   Initial Impression / Assessment and Plan / ED Course  48 y.o. female who presents to the ED for chest pain as above.  Ddx: PE, ACS,  musculoskeletal pain  Plan: Labs, EKG, imaging based on lab results  Work-up reveals mildly elevated d-dimer.  Follow-up CT revealed no PE.  EKG is without acute ischemic changes.  High-sensitivity troponin x 2 are less than 18, delta less than 5.  Did discuss  potential for observation admission for expedited cardiology evaluation given her risk factors, however patient does not wish to be admitted at this time.  As such, will plan for discharge with PCP follow-up and provided information for close outpatient cardiology follow-up.  Patient is agreeable with the plan at discharge.  Discussed return precautions.   Final Clinical Impression(s) / ED Diagnosis  Final diagnoses:  Chest pain in adult       Note:  This document was prepared using Dragon voice recognition software and may include unintentional dictation errors.   Lilia Pro., MD 02/17/19 [phone]

## 2019-02-17 NOTE — ED Triage Notes (Signed)
Chest pain since yesterday.  Thought it was gas and took gas meds, but stilol there this am.  April Chaney to urgent care andthey sent here here.

## 2019-04-05 ENCOUNTER — Encounter: Payer: Self-pay | Admitting: Intensive Care

## 2019-04-05 ENCOUNTER — Inpatient Hospital Stay
Admission: EM | Admit: 2019-04-05 | Discharge: 2019-04-06 | DRG: 291 | Discharge status: Against Medical Advice | Payer: BC Managed Care – PPO | Attending: Internal Medicine | Admitting: Internal Medicine

## 2019-04-05 ENCOUNTER — Emergency Department: Payer: BC Managed Care – PPO

## 2019-04-05 ENCOUNTER — Emergency Department
Admission: EM | Admit: 2019-04-05 | Discharge: 2019-04-05 | Disposition: A | Discharge status: Home / Self Care | Payer: BC Managed Care – PPO | Source: Home / Self Care | Attending: Emergency Medicine | Admitting: Emergency Medicine

## 2019-04-05 ENCOUNTER — Encounter: Payer: Self-pay | Admitting: Emergency Medicine

## 2019-04-05 ENCOUNTER — Other Ambulatory Visit: Payer: Self-pay

## 2019-04-05 DIAGNOSIS — I110 Hypertensive heart disease with heart failure: Secondary | ICD-10-CM | POA: Diagnosis not present

## 2019-04-05 DIAGNOSIS — J810 Acute pulmonary edema: Secondary | ICD-10-CM

## 2019-04-05 DIAGNOSIS — J9601 Acute respiratory failure with hypoxia: Secondary | ICD-10-CM | POA: Diagnosis present

## 2019-04-05 DIAGNOSIS — I10 Essential (primary) hypertension: Secondary | ICD-10-CM | POA: Insufficient documentation

## 2019-04-05 DIAGNOSIS — F329 Major depressive disorder, single episode, unspecified: Secondary | ICD-10-CM | POA: Diagnosis present

## 2019-04-05 DIAGNOSIS — Z86010 Personal history of colonic polyps: Secondary | ICD-10-CM | POA: Diagnosis not present

## 2019-04-05 DIAGNOSIS — F17210 Nicotine dependence, cigarettes, uncomplicated: Secondary | ICD-10-CM | POA: Diagnosis present

## 2019-04-05 DIAGNOSIS — Z79899 Other long term (current) drug therapy: Secondary | ICD-10-CM | POA: Insufficient documentation

## 2019-04-05 DIAGNOSIS — Z96643 Presence of artificial hip joint, bilateral: Secondary | ICD-10-CM | POA: Diagnosis present

## 2019-04-05 DIAGNOSIS — Z888 Allergy status to other drugs, medicaments and biological substances status: Secondary | ICD-10-CM | POA: Diagnosis not present

## 2019-04-05 DIAGNOSIS — R002 Palpitations: Secondary | ICD-10-CM | POA: Insufficient documentation

## 2019-04-05 DIAGNOSIS — Z833 Family history of diabetes mellitus: Secondary | ICD-10-CM

## 2019-04-05 DIAGNOSIS — M1990 Unspecified osteoarthritis, unspecified site: Secondary | ICD-10-CM | POA: Diagnosis present

## 2019-04-05 DIAGNOSIS — Z7952 Long term (current) use of systemic steroids: Secondary | ICD-10-CM

## 2019-04-05 DIAGNOSIS — I509 Heart failure, unspecified: Secondary | ICD-10-CM

## 2019-04-05 DIAGNOSIS — R0789 Other chest pain: Secondary | ICD-10-CM

## 2019-04-05 DIAGNOSIS — E119 Type 2 diabetes mellitus without complications: Secondary | ICD-10-CM | POA: Diagnosis present

## 2019-04-05 DIAGNOSIS — Z7984 Long term (current) use of oral hypoglycemic drugs: Secondary | ICD-10-CM

## 2019-04-05 DIAGNOSIS — Z20828 Contact with and (suspected) exposure to other viral communicable diseases: Secondary | ICD-10-CM | POA: Diagnosis present

## 2019-04-05 DIAGNOSIS — Z8249 Family history of ischemic heart disease and other diseases of the circulatory system: Secondary | ICD-10-CM

## 2019-04-05 DIAGNOSIS — M069 Rheumatoid arthritis, unspecified: Secondary | ICD-10-CM | POA: Insufficient documentation

## 2019-04-05 DIAGNOSIS — K219 Gastro-esophageal reflux disease without esophagitis: Secondary | ICD-10-CM | POA: Diagnosis present

## 2019-04-05 DIAGNOSIS — E1140 Type 2 diabetes mellitus with diabetic neuropathy, unspecified: Secondary | ICD-10-CM | POA: Insufficient documentation

## 2019-04-05 LAB — COMPREHENSIVE METABOLIC PANEL
ALT: 20 U/L (ref 0–44)
AST: 36 U/L (ref 15–41)
Albumin: 4.1 g/dL (ref 3.5–5.0)
Alkaline Phosphatase: 75 U/L (ref 38–126)
Anion gap: 12 (ref 5–15)
BUN: 10 mg/dL (ref 6–20)
CO2: 23 mmol/L (ref 22–32)
Calcium: 9.1 mg/dL (ref 8.9–10.3)
Chloride: 102 mmol/L (ref 98–111)
Creatinine, Ser: 0.68 mg/dL (ref 0.44–1.00)
GFR calc Af Amer: >60 mL/min (ref >60)
GFR calc non Af Amer: >60 mL/min (ref >60)
Glucose, Bld: 186 mg/dL — ABNORMAL HIGH (ref 70–99)
Potassium: 3.5 mmol/L (ref 3.5–5.1)
Sodium: 137 mmol/L (ref 135–145)
Total Bilirubin: 1 mg/dL (ref 0.3–1.2)
Total Protein: 8.2 g/dL — ABNORMAL HIGH (ref 6.5–8.1)

## 2019-04-05 LAB — CBC WITH DIFFERENTIAL/PLATELET
Abs Immature Granulocytes: 0.07 10*3/uL (ref 0.00–0.07)
Basophils Absolute: 0 10*3/uL (ref 0.0–0.1)
Basophils Relative: 0 %
Eosinophils Absolute: 0 10*3/uL (ref 0.0–0.5)
Eosinophils Relative: 0 %
HCT: 43.7 % (ref 36.0–46.0)
Hemoglobin: 14.1 g/dL (ref 12.0–15.0)
Immature Granulocytes: 1 %
Lymphocytes Relative: 8 %
Lymphs Abs: 0.7 10*3/uL (ref 0.7–4.0)
MCH: 29 pg (ref 26.0–34.0)
MCHC: 32.3 g/dL (ref 30.0–36.0)
MCV: 89.7 fL (ref 80.0–100.0)
Monocytes Absolute: 0.5 10*3/uL (ref 0.1–1.0)
Monocytes Relative: 6 %
Neutro Abs: 8.1 10*3/uL — ABNORMAL HIGH (ref 1.7–7.7)
Neutrophils Relative %: 85 %
Platelets: 208 10*3/uL (ref 150–400)
RBC: 4.87 MIL/uL (ref 3.87–5.11)
RDW: 15 % (ref 11.5–15.5)
WBC: 9.5 10*3/uL (ref 4.0–10.5)
nRBC: 0 % (ref 0.0–0.2)

## 2019-04-05 LAB — CBC
HCT: 40 % (ref 36.0–46.0)
Hemoglobin: 13 g/dL (ref 12.0–15.0)
MCH: 29.3 pg (ref 26.0–34.0)
MCHC: 32.5 g/dL (ref 30.0–36.0)
MCV: 90.3 fL (ref 80.0–100.0)
Platelets: 199 10*3/uL (ref 150–400)
RBC: 4.43 MIL/uL (ref 3.87–5.11)
RDW: 15.4 % (ref 11.5–15.5)
WBC: 7.8 10*3/uL (ref 4.0–10.5)
nRBC: 0 % (ref 0.0–0.2)

## 2019-04-05 LAB — BASIC METABOLIC PANEL
Anion gap: 12 (ref 5–15)
BUN: 9 mg/dL (ref 6–20)
CO2: 23 mmol/L (ref 22–32)
Calcium: 8.7 mg/dL — ABNORMAL LOW (ref 8.9–10.3)
Chloride: 102 mmol/L (ref 98–111)
Creatinine, Ser: 0.69 mg/dL (ref 0.44–1.00)
GFR calc Af Amer: >60 mL/min (ref >60)
GFR calc non Af Amer: >60 mL/min (ref >60)
Glucose, Bld: 155 mg/dL — ABNORMAL HIGH (ref 70–99)
Potassium: 3.7 mmol/L (ref 3.5–5.1)
Sodium: 137 mmol/L (ref 135–145)

## 2019-04-05 LAB — SARS CORONAVIRUS 2 BY RT PCR (HOSPITAL ORDER, PERFORMED IN ~~LOC~~ HOSPITAL LAB): SARS Coronavirus 2: NEGATIVE

## 2019-04-05 LAB — HEMOGLOBIN A1C
Hgb A1c MFr Bld: 6.7 % — ABNORMAL HIGH (ref 4.8–5.6)
Mean Plasma Glucose: 145.59 mg/dL

## 2019-04-05 LAB — GLUCOSE, CAPILLARY: Glucose-Capillary: 307 mg/dL — ABNORMAL HIGH (ref 70–99)

## 2019-04-05 LAB — BRAIN NATRIURETIC PEPTIDE: B Natriuretic Peptide: 452 pg/mL — ABNORMAL HIGH (ref 0.0–100.0)

## 2019-04-05 LAB — TROPONIN I (HIGH SENSITIVITY)
Troponin I (High Sensitivity): 22 ng/L — ABNORMAL HIGH (ref <18)
Troponin I (High Sensitivity): 19 ng/L — ABNORMAL HIGH (ref <18)
Troponin I (High Sensitivity): 21 ng/L — ABNORMAL HIGH (ref <18)
Troponin I (High Sensitivity): 28 ng/L — ABNORMAL HIGH (ref <18)

## 2019-04-05 MED ORDER — ACETAMINOPHEN 650 MG RE SUPP
650.0000 mg | Freq: Four times a day (QID) | RECTAL | Status: DC | PRN
  Filled 2019-04-05: qty 1

## 2019-04-05 MED ORDER — METOPROLOL TARTRATE 50 MG PO TABS: 50.0000 mg | ORAL_TABLET | Freq: Once | ORAL | Status: DC

## 2019-04-05 MED ORDER — SODIUM CHLORIDE 0.9% FLUSH
3.0000 mL | INTRAVENOUS | Status: DC | PRN
  Administered 2019-04-06: 3 mL via INTRAVENOUS
  Filled 2019-04-05: qty 3

## 2019-04-05 MED ORDER — INSULIN ASPART 100 UNIT/ML ~~LOC~~ SOLN
0.0000 [IU] | Freq: Three times a day (TID) | SUBCUTANEOUS | Status: DC
  Administered 2019-04-06: 1 [IU] via SUBCUTANEOUS
  Administered 2019-04-06: 2 [IU] via SUBCUTANEOUS
  Administered 2019-04-06: 1 [IU] via SUBCUTANEOUS
  Filled 2019-04-05 (×3): qty 1

## 2019-04-05 MED ORDER — SODIUM CHLORIDE 0.9 % IV SOLN: 250.0000 mL | INTRAVENOUS | Status: DC | PRN

## 2019-04-05 MED ORDER — NICOTINE 14 MG/24HR TD PT24
14.0000 mg | MEDICATED_PATCH | Freq: Every day | TRANSDERMAL | Status: DC
  Filled 2019-04-05: qty 1

## 2019-04-05 MED ORDER — FUROSEMIDE 10 MG/ML IJ SOLN
40.0000 mg | Freq: Two times a day (BID) | INTRAMUSCULAR | Status: DC
  Administered 2019-04-05 – 2019-04-06 (×2): 40 mg via INTRAVENOUS
  Filled 2019-04-05 (×3): qty 4

## 2019-04-05 MED ORDER — DIPHENHYDRAMINE HCL 25 MG PO CAPS: 50.0000 mg | ORAL_CAPSULE | Freq: Every evening | ORAL | Status: DC | PRN

## 2019-04-05 MED ORDER — METOPROLOL SUCCINATE ER 50 MG PO TB24
50.0000 mg | ORAL_TABLET | Freq: Once | ORAL | Status: AC
  Administered 2019-04-05: 50 mg via ORAL
  Filled 2019-04-05: qty 1

## 2019-04-05 MED ORDER — FOLIC ACID 1 MG PO TABS
3.0000 mg | ORAL_TABLET | Freq: Every day | ORAL | Status: DC
  Administered 2019-04-06: 3 mg via ORAL
  Filled 2019-04-05 (×2): qty 3

## 2019-04-05 MED ORDER — SODIUM CHLORIDE 0.9% FLUSH
3.0000 mL | Freq: Two times a day (BID) | INTRAVENOUS | Status: DC
  Administered 2019-04-06: 3 mL via INTRAVENOUS

## 2019-04-05 MED ORDER — NITROGLYCERIN 2 % TD OINT
1.0000 [in_us] | TOPICAL_OINTMENT | Freq: Once | TRANSDERMAL | Status: AC
  Administered 2019-04-05: 1 [in_us] via TOPICAL
  Filled 2019-04-05: qty 1

## 2019-04-05 MED ORDER — NITROGLYCERIN 0.4 MG SL SUBL
0.4000 mg | SUBLINGUAL_TABLET | SUBLINGUAL | Status: DC | PRN
  Administered 2019-04-05: 0.4 mg via SUBLINGUAL
  Filled 2019-04-05 (×2): qty 1

## 2019-04-05 MED ORDER — POTASSIUM CHLORIDE CRYS ER 10 MEQ PO TBCR
5.0000 meq | EXTENDED_RELEASE_TABLET | Freq: Every day | ORAL | Status: DC
  Filled 2019-04-05: qty 1

## 2019-04-05 MED ORDER — BUPROPION HCL ER (XL) 150 MG PO TB24
300.0000 mg | ORAL_TABLET | Freq: Every day | ORAL | Status: DC
  Administered 2019-04-06: 300 mg via ORAL
  Filled 2019-04-05: qty 1

## 2019-04-05 MED ORDER — IPRATROPIUM-ALBUTEROL 0.5-2.5 (3) MG/3ML IN SOLN
3.0000 mL | Freq: Once | RESPIRATORY_TRACT | Status: AC
  Administered 2019-04-05: 3 mL via RESPIRATORY_TRACT
  Filled 2019-04-05: qty 6

## 2019-04-05 MED ORDER — LISINOPRIL 5 MG PO TABS
5.0000 mg | ORAL_TABLET | Freq: Every day | ORAL | Status: DC
  Filled 2019-04-05: qty 0.5
  Filled 2019-04-05: qty 1

## 2019-04-05 MED ORDER — HYDROXYCHLOROQUINE SULFATE 200 MG PO TABS
200.0000 mg | ORAL_TABLET | Freq: Two times a day (BID) | ORAL | Status: DC
  Administered 2019-04-06 (×2): 200 mg via ORAL
  Filled 2019-04-05 (×3): qty 1

## 2019-04-05 MED ORDER — ACETAMINOPHEN 500 MG PO TABS: 1000.0000 mg | ORAL_TABLET | Freq: Every evening | ORAL | Status: DC | PRN

## 2019-04-05 MED ORDER — ENOXAPARIN SODIUM 40 MG/0.4ML ~~LOC~~ SOLN
40.0000 mg | SUBCUTANEOUS | Status: DC
  Administered 2019-04-05: 40 mg via SUBCUTANEOUS
  Filled 2019-04-05 (×2): qty 0.4

## 2019-04-05 MED ORDER — ALBUTEROL SULFATE HFA 108 (90 BASE) MCG/ACT IN AERS: 2.0000 | INHALATION_SPRAY | Freq: Four times a day (QID) | RESPIRATORY_TRACT | 0 refills | Status: DC | PRN

## 2019-04-05 MED ORDER — HYDROCODONE-ACETAMINOPHEN 5-325 MG PO TABS: 1.0000 | ORAL_TABLET | ORAL | Status: DC | PRN

## 2019-04-05 MED ORDER — DIPHENHYDRAMINE-APAP (SLEEP) 25-500 MG PO TABS: 2.0000 | ORAL_TABLET | Freq: Every evening | ORAL | Status: DC | PRN

## 2019-04-05 MED ORDER — BISACODYL 5 MG PO TBEC
5.0000 mg | DELAYED_RELEASE_TABLET | Freq: Every day | ORAL | Status: DC | PRN
  Filled 2019-04-05: qty 1

## 2019-04-05 MED ORDER — ACETAMINOPHEN 325 MG PO TABS: 650.0000 mg | ORAL_TABLET | Freq: Four times a day (QID) | ORAL | Status: DC | PRN

## 2019-04-05 MED ORDER — IPRATROPIUM-ALBUTEROL 0.5-2.5 (3) MG/3ML IN SOLN
3.0000 mL | Freq: Once | RESPIRATORY_TRACT | Status: AC
  Administered 2019-04-05: 3 mL via RESPIRATORY_TRACT

## 2019-04-05 MED ORDER — ONDANSETRON HCL 4 MG/2ML IJ SOLN: 4.0000 mg | Freq: Four times a day (QID) | INTRAMUSCULAR | Status: DC | PRN

## 2019-04-05 MED ORDER — ALBUTEROL SULFATE (2.5 MG/3ML) 0.083% IN NEBU: 2.5000 mg | INHALATION_SOLUTION | RESPIRATORY_TRACT | Status: DC | PRN

## 2019-04-05 MED ORDER — PANTOPRAZOLE SODIUM 40 MG PO TBEC
40.0000 mg | DELAYED_RELEASE_TABLET | Freq: Two times a day (BID) | ORAL | Status: DC
  Administered 2019-04-06: 40 mg via ORAL
  Filled 2019-04-05 (×2): qty 1

## 2019-04-05 MED ORDER — VITAMIN D3 25 MCG (1000 UNIT) PO TABS
2000.0000 [IU] | ORAL_TABLET | Freq: Every day | ORAL | Status: DC
  Administered 2019-04-06: 2000 [IU] via ORAL
  Filled 2019-04-05 (×2): qty 2

## 2019-04-05 MED ORDER — ONDANSETRON HCL 4 MG PO TABS
4.0000 mg | ORAL_TABLET | Freq: Four times a day (QID) | ORAL | Status: DC | PRN
  Filled 2019-04-05: qty 1

## 2019-04-05 MED ORDER — POTASSIUM 99 MG PO TABS: 99.0000 mg | ORAL_TABLET | Freq: Every day | ORAL | Status: DC

## 2019-04-05 MED ORDER — SENNOSIDES-DOCUSATE SODIUM 8.6-50 MG PO TABS
1.0000 | ORAL_TABLET | Freq: Every evening | ORAL | Status: DC | PRN
  Filled 2019-04-05: qty 1

## 2019-04-05 MED ORDER — INSULIN ASPART 100 UNIT/ML ~~LOC~~ SOLN
0.0000 [IU] | Freq: Every day | SUBCUTANEOUS | Status: DC
  Administered 2019-04-05: 5 [IU] via SUBCUTANEOUS
  Filled 2019-04-05: qty 1

## 2019-04-05 MED ORDER — VALACYCLOVIR HCL 500 MG PO TABS
1000.0000 mg | ORAL_TABLET | Freq: Two times a day (BID) | ORAL | Status: DC
  Administered 2019-04-05: 1000 mg via ORAL
  Filled 2019-04-05 (×4): qty 2

## 2019-04-05 MED ORDER — FUROSEMIDE 10 MG/ML IJ SOLN
60.0000 mg | Freq: Once | INTRAMUSCULAR | Status: AC
  Administered 2019-04-05: 60 mg via INTRAVENOUS
  Filled 2019-04-05: qty 8

## 2019-04-05 MED ORDER — METHYLPREDNISOLONE SODIUM SUCC 125 MG IJ SOLR
125.0000 mg | Freq: Once | INTRAMUSCULAR | Status: AC
  Administered 2019-04-05: 125 mg via INTRAVENOUS
  Filled 2019-04-05: qty 2

## 2019-04-05 NOTE — ED Notes (Signed)
Provided pt with food tray, and ginger ale.

## 2019-04-05 NOTE — ED Provider Notes (Signed)
[facility] Emergency Department Provider Note    First MD Initiated Contact with Patient 04/05/19 1528     (approximate)  I have reviewed the triage vital signs and the nursing notes.   HISTORY  Chief Complaint Shortness of Breath    HPI Jonnae Ramotar is a 48 y.o. female presents the ER for worsening chest pain or shortness of breath.  Was seen here earlier today with reassuring work-up discharged home in good condition but over the course of the day started feeling chest pain or shortness of breath again.  EMS found the patient was reportedly hypoxic in the 70s placed on nonrebreather.  Does have diffuse wheezing on exam.    Past Medical History:  Diagnosis Date  . Arthritis   . Colon polyps   . Complication of anesthesia    woke up during 1 colonscopy 3 yrs ago  . Diabetes mellitus without complication ([place])    type 2  . DM (diabetes mellitus) ([place])   . Family history of adverse reaction to anesthesia    mother slow to awaken after 1 procedure  . GERD (gastroesophageal reflux disease)   . Hypertension    hx of no bp meds for last 11 years after weight loss  . Pancreatitis 3-4 yrs ago and feb 2018  . Presence of artificial hip    has to take antibiotics prior to any procedure-dental, etc  . Ulcer    Family History  Problem Relation Age of Onset  . Diabetes Mother   . Diabetes Father   . Hypertension Father    Past Surgical History:  Procedure Laterality Date  . COLONOSCOPY WITH PROPOFOL N/A 02/11/2019   Procedure: COLONOSCOPY WITH PROPOFOL;  Surgeon: Lollie Sails, MD;  Location: [facility] ENDOSCOPY;  Service: Endoscopy;  Laterality: N/A;  . colonscopy     x 4  . ESOPHAGOGASTRODUODENOSCOPY (EGD) WITH PROPOFOL N/A 02/11/2019   Procedure: ESOPHAGOGASTRODUODENOSCOPY (EGD) WITH PROPOFOL;  Surgeon: Lollie Sails, MD;  Location: [facility] ENDOSCOPY;  Service: Endoscopy;  Laterality: N/A;  . EUS N/A 01/01/2017   Procedure: UPPER  ENDOSCOPIC ULTRASOUND (EUS) LINEAR;  Surgeon: Milus Banister, MD;  Location: [facility] ENDOSCOPY;  Service: Endoscopy;  Laterality: N/A;  . FINE NEEDLE ASPIRATION  01/01/2017   Procedure: FINE NEEDLE ASPIRATION;  Surgeon: Milus Banister, MD;  Location: [facility] ENDOSCOPY;  Service: Endoscopy;;  . JOINT REPLACEMENT     hip replacement  . STRABISMUS SURGERY  12/12/2011   Procedure: REPAIR STRABISMUS;  Surgeon: Derry Skill, MD;  Location: [place];  Service: Ophthalmology;  Laterality: Right;  . TOTAL HIP ARTHROPLASTY     bilat   Patient Active Problem List   Diagnosis Date Noted  . Epigastric pain   . Alcohol-induced acute pancreatitis   . Abdominal pain 08/03/2016  . Encounter for health maintenance examination 10/22/2015  . Rheumatoid arthritis involving multiple sites with positive rheumatoid factor ([place]) 10/22/2015  . BMI 35.0-35.9,adult 09/23/2015  . Elevated rheumatoid factor 09/23/2015  . History of adenomatous polyp of colon 09/23/2015  . Alopecia 08/18/2015  . Benign essential hypertension 08/18/2015  . Benign neoplasm of colon 08/18/2015  . Epigastric discomfort 08/18/2015  . GERD without esophagitis 08/18/2015  . Heartburn 08/18/2015  . High risk medication use 08/18/2015  . Hyperlipidemia 08/18/2015  . Hyponatremia 08/18/2015  . Infective otitis externa of left ear 08/18/2015  . Insomnia 08/18/2015  . Neuropathy of both feet 08/18/2015  . Nicotine dependence 08/18/2015  . Oligomenorrhea 08/18/2015  .  Obesity 08/18/2015  . Gastro-esophageal reflux disease with esophagitis 08/18/2015  . Stress incontinence in female 08/18/2015  . Type 2 diabetes, uncontrolled, with neuropathy ([place]) 08/18/2015  . Uncontrolled diabetes mellitus with complications ([place]) 99991111  . Vitamin D deficiency 08/18/2015  . Wheezing 08/18/2015  . Colon polyps 01/06/2014  . Pancreatic mass 01/06/2014  . Pancreatitis 01/06/2014      Prior to Admission medications   Medication  Sig Start Date End Date Taking? Authorizing Provider  albuterol (VENTOLIN HFA) 108 (90 Base) MCG/ACT inhaler Inhale 2 puffs into the lungs every 6 (six) hours as needed for wheezing or shortness of breath (8). 04/05/19   Arta Silence, MD  ARTIFICIAL TEAR OP Apply 1 drop to eye daily as needed (dry eyes).    [provider]  buPROPion (WELLBUTRIN XL) 300 MG 24 hr tablet Take 300 mg by mouth daily.  01/03/16   [provider]  cholecalciferol (VITAMIN D) 1000 units tablet Take 2,000 Units by mouth daily.    [provider]  ciprofloxacin (CIPRO) 500 MG tablet Take 1 tablet (500 mg total) by mouth 2 (two) times daily. Patient not taking: Reported on 02/10/2019 01/01/17   Milus Banister, MD  cyclobenzaprine (FLEXERIL) 10 MG tablet Take 1 tablet (10 mg total) by mouth 2 (two) times daily as needed for muscle spasms. Patient not taking: Reported on 02/17/2019 02/14/17   Lysbeth Penner, FNP  diphenhydramine-acetaminophen (TYLENOL PM EXTRA STRENGTH) 25-500 MG TABS tablet Take 2 tablets by mouth at bedtime as needed (sleep).    [provider]  folic acid (FOLVITE) 1 MG tablet Take 3 mg by mouth daily.    [provider]  HYDROcodone-acetaminophen (NORCO/VICODIN) 5-325 MG tablet TK 1 T PO Q 6 TO 8 H PNR P 02/22/17   [provider]  hydroxychloroquine (PLAQUENIL) 200 MG tablet Take 200 mg by mouth 2 (two) times daily. 08/24/18   [provider]  ibuprofen (ADVIL,MOTRIN) 200 MG tablet Take 400 mg by mouth 3 (three) times daily as needed for headache or moderate pain.    [provider]  ibuprofen (ADVIL,MOTRIN) 800 MG tablet Take 1 tablet (800 mg total) by mouth 3 (three) times daily. Patient not taking: Reported on 02/17/2019 02/14/17   Lysbeth Penner, FNP  levonorgestrel (MIRENA) 20 MCG/24HR IUD 1 each by Intrauterine route once.     [provider]  lisinopril (ZESTRIL) 5 MG tablet Take 5 mg by mouth daily.     [provider]  metFORMIN (GLUCOPHAGE-XR) 500 MG 24 hr tablet Take 500mg s by mouth twice daily with meals 07/24/16   [provider]  METHOTREXATE PO Take by mouth.    [provider]  metoCLOPramide (REGLAN) 10 MG tablet Take 1 tablet (10 mg total) by mouth every 8 (eight) hours as needed for up to 5 days for nausea. 11/14/18 11/19/18  Arta Silence, MD  moxifloxacin (VIGAMOX) 0.5 % ophthalmic solution INT 1 GTT IN OU TID FOR 7 DAYS 02/17/17   [provider]  Multiple Vitamin (MULTIVITAMIN WITH MINERALS) TABS Take 1 tablet by mouth daily.    [provider]  naproxen (NAPROSYN) 500 MG tablet TK 1 T PO BID 02/22/17   [provider]  omeprazole (PRILOSEC) 20 MG capsule Take 1 capsule (20 mg total) by mouth daily. Patient taking differently: Take 20 mg by mouth daily as needed (acid reflux).  11/10/16 01/10/17  Jonathon Bellows, MD  orphenadrine (NORFLEX) 100 MG tablet TK 1 T PO  BID PRN 02/22/17   [provider]  pantoprazole (PROTONIX) 40 MG tablet Take 40 mg by mouth 2 (two) times daily before a meal.    [provider]  Potassium 99 MG TABS Take 99 mg by mouth daily.    [provider]  predniSONE (DELTASONE) 10 MG tablet TK 2 TS PO D 02/22/17   [provider]  Prenatal Vit-Fe Fumarate-FA (PRENATAL PO) Take 1 tablet by mouth daily as needed (vitamin supplementation).    [provider]  Tofacitinib Citrate (XELJANZ) 5 MG TABS Take 5 mg by mouth 2 (two) times daily.     [provider]  Turmeric 500 MG TABS Take 500 mg by mouth daily.    [provider]  valACYclovir (VALTREX) 1000 MG tablet Take 1,000 mg by mouth every 12 (twelve) hours. 09/20/18   [provider]    Allergies Patient has no known allergies.    Social History Social History   Tobacco Use  . Smoking status: Current Some Day Smoker    Packs/day: 0.50  . Smokeless tobacco: Never Used  Substance Use Topics  .  Alcohol use: Yes    Comment: occ  . Drug use: No    Review of Systems Patient denies headaches, rhinorrhea, blurry vision, numbness, shortness of breath, chest pain, edema, cough, abdominal pain, nausea, vomiting, diarrhea, dysuria, fevers, rashes or hallucinations unless otherwise stated above in HPI. ____________________________________________   PHYSICAL EXAM:  VITAL SIGNS: Vitals:   04/05/19 1730 04/05/19 1800  BP: 123/84 (!) 152/109  Pulse: 96 100  Resp: (!) 23 (!) 23  Temp:    SpO2: 95% 94%    Constitutional: Alert and oriented.  Eyes: Conjunctivae are normal.  Head: Atraumatic. Nose: No congestion/rhinnorhea. Mouth/Throat: Mucous membranes are moist.   Neck: No stridor. Painless ROM.  Cardiovascular: Normal rate, regular rhythm. Grossly normal heart sounds.  Good peripheral circulation. Respiratory: mild respiratory distress with diffuse coarse expiratory wheeze Gastrointestinal: Soft and nontender. No distention. No abdominal bruits. No CVA tenderness. Genitourinary:  Musculoskeletal: No lower extremity tenderness nor edema.  No joint effusions. Neurologic:  Normal speech and language. No gross focal neurologic deficits are appreciated. No facial droop Skin:  Skin is warm, dry and intact. No rash noted. Psychiatric: Mood and affect are normal. Speech and behavior are normal.  ____________________________________________   LABS (all labs ordered are listed, but only abnormal results are displayed)  Results for orders placed or performed during the hospital encounter of 04/05/19 (from the past 24 hour(s))  Troponin I (High Sensitivity)     Status: Abnormal   Collection Time: 04/05/19  3:33 PM  Result Value Ref Range   Troponin I (High Sensitivity) 21 (H) <18 ng/L  CBC with Differential/Platelet     Status: Abnormal   Collection Time: 04/05/19  3:33 PM  Result Value Ref Range   WBC 9.5 4.0 - 10.5 K/uL   RBC 4.87 3.87 - 5.11 MIL/uL   Hemoglobin 14.1 12.0 - 15.0  g/dL   HCT 43.7 36.0 - 46.0 %   MCV 89.7 80.0 - 100.0 fL   MCH 29.0 26.0 - 34.0 pg   MCHC 32.3 30.0 - 36.0 g/dL   RDW 15.0 11.5 - 15.5 %   Platelets 208 150 - 400 K/uL   nRBC 0.0 0.0 - 0.2 %   Neutrophils Relative % 85 %   Neutro Abs 8.1 (H) 1.7 - 7.7 K/uL   Lymphocytes Relative 8 %   Lymphs Abs 0.7 0.7 -  4.0 K/uL   Monocytes Relative 6 %   Monocytes Absolute 0.5 0.1 - 1.0 K/uL   Eosinophils Relative 0 %   Eosinophils Absolute 0.0 0.0 - 0.5 K/uL   Basophils Relative 0 %   Basophils Absolute 0.0 0.0 - 0.1 K/uL   Immature Granulocytes 1 %   Abs Immature Granulocytes 0.07 0.00 - 0.07 K/uL  Comprehensive metabolic panel     Status: Abnormal   Collection Time: 04/05/19  3:33 PM  Result Value Ref Range   Sodium 137 135 - 145 mmol/L   Potassium 3.5 3.5 - 5.1 mmol/L   Chloride 102 98 - 111 mmol/L   CO2 23 22 - 32 mmol/L   Glucose, Bld 186 (H) 70 - 99 mg/dL   BUN 10 6 - 20 mg/dL   Creatinine, Ser 0.68 0.44 - 1.00 mg/dL   Calcium 9.1 8.9 - 10.3 mg/dL   Total Protein 8.2 (H) 6.5 - 8.1 g/dL   Albumin 4.1 3.5 - 5.0 g/dL   AST 36 15 - 41 U/L   ALT 20 0 - 44 U/L   Alkaline Phosphatase 75 38 - 126 U/L   Total Bilirubin 1.0 0.3 - 1.2 mg/dL   GFR calc non Af Amer >60 >60 mL/min   GFR calc Af Amer >60 >60 mL/min   Anion gap 12 5 - 15  SARS Coronavirus 2 by RT PCR (hospital order, performed in [place] hospital lab) Nasopharyngeal Nasopharyngeal Swab     Status: None   Collection Time: 04/05/19  4:01 PM   Specimen: Nasopharyngeal Swab  Result Value Ref Range   SARS Coronavirus 2 NEGATIVE NEGATIVE   ____________________________________________  EKG My review and personal interpretation at Time: 15:34   Indication: sob  Rate: 105  Rhythm: sinus Axis: normal Other: normal intervals, no stemi, baseline artifact ____________________________________________  RADIOLOGY  I personally reviewed all radiographic images ordered to evaluate for the above acute complaints and reviewed  radiology reports and findings.  These findings were personally discussed with the patient.  Please see medical record for radiology report.  ____________________________________________   PROCEDURES  Procedure(s) performed:  Procedures    Critical Care performed: yes ____________________________________________   INITIAL IMPRESSION / ASSESSMENT AND PLAN / ED COURSE  Pertinent labs & imaging results that were available during my care of the patient were reviewed by me and considered in my medical decision making (see chart for details).   DDX: Asthma, copd, CHF, pna, ptx, malignancy, Pe, anemia   Danyell Ramotar is a 48 y.o. who presents to the ED with shortness of breath as described above associated with some chest discomfort.  She is afebrile mildly hypertensive with new respiratory failure with hypoxia requiring supplemental oxygen.  Has diffuse wheezing may be underlying bronchospasm also possible CHF.  Will give nebulizers repeat chest x-ray.  EKG without STEMI criteria no depressions noted.  Have a lower suspicion for PE given diffuse lung sounds with recent negative CTA.  The patient will be placed on continuous pulse oximetry and telemetry for monitoring.  Laboratory evaluation will be sent to evaluate for the above complaints.     Clinical Course as of Apr 05 1803  Tue Apr 05, 2019  1721 Thus far is concerning for acute pulmonary edema causing her symptoms.  No white count to suggest pneumonia or infectious process.  Troponin is mildly elevated.  Was given nitro.  Does have new hypoxia.  Not having much change after nebulizers.  After starting nitro will give Lasix.   [  PR]  1803 Patient feels significantly improved after nitro and Lasix.  Denies any chest pain.  Will discuss with hospitalist for admission.   [PR]    Clinical Course User Index [PR] Merlyn Lot, MD    The patient was evaluated in Emergency Department today for the symptoms described in the  history of present illness. He/she was evaluated in the context of the global COVID-19 pandemic, which necessitated consideration that the patient might be at risk for infection with the SARS-CoV-2 virus that causes COVID-19. Institutional protocols and algorithms that pertain to the evaluation of patients at risk for COVID-19 are in a state of rapid change based on information released by regulatory bodies including the CDC and federal and state organizations. These policies and algorithms were followed during the patient's care in the ED.  As part of my medical decision making, I reviewed the following data within the [facility] notes reviewed and incorporated, Labs reviewed, notes from prior ED visits and [place] Controlled Substance Database   ____________________________________________   FINAL CLINICAL IMPRESSION(S) / ED DIAGNOSES  Final diagnoses:  Acute respiratory failure with hypoxia ([place])  Acute pulmonary edema ([place])      NEW MEDICATIONS STARTED DURING THIS VISIT:  New Prescriptions   No medications on file     Note:  This document was prepared using Dragon voice recognition software and may include unintentional dictation errors.    Merlyn Lot, MD 04/05/19 [phone]

## 2019-04-05 NOTE — ED Triage Notes (Signed)
Was here earlier today and was discharged.  At home got acutely [facility].  Fire arrived and sats in 70s, arrived on NRB.  If remove O2 per EMS sats decrease again.

## 2019-04-05 NOTE — ED Notes (Signed)
Pt requesting something to drink, will let admitting MD know.

## 2019-04-05 NOTE — ED Notes (Signed)
Pt given ice chips

## 2019-04-05 NOTE — ED Triage Notes (Signed)
Patient c/o chest pain since September. Saw cardiologist yesterday and was given metoprolol and reports she took twice and puked it up both times. States "I could not sleep all night and my heart is pounding"

## 2019-04-05 NOTE — ED Notes (Signed)
Blood glucose 307

## 2019-04-05 NOTE — Discharge Instructions (Signed)
Continue to take the metoprolol as prescribed.  You may use the albuterol inhaler as needed for shortness of breath or wheezing.  Return to the ER immediately for new, worsening, or persistent severe chest pain, difficulty breathing, weakness or lightheadedness, or any other new or worsening symptoms that concern you.  Follow-up with Dr. Saralyn Pilar as scheduled.

## 2019-04-05 NOTE — ED Provider Notes (Signed)
[facility] Emergency Department Provider Note ____________________________________________   First MD Initiated Contact with Patient 04/05/19 [phone]     (approximate)  I have reviewed the triage vital signs and the nursing notes.   HISTORY  Chief Complaint Chest Pain    HPI April Chaney is a 48 y.o. female with a history of hypertension who presents with chest pain, palpitations, and difficulty sleeping last night.  The patient has had chest pain and tachycardia for over a month for which she has been previously evaluated in the ER.  She saw the cardiologist yesterday and was prescribed metoprolol.  She tried to take the medicine twice yesterday afternoon and evening, but immediately threw up both times.  Patient states that then she went to bed, but continued to have palpitations, worsening pain, and could not sleep.  She denies persistent vomiting.  She has no shortness of breath or lightheadedness.  She denies any leg swelling.  Past Medical History:  Diagnosis Date  . Arthritis   . Colon polyps   . Complication of anesthesia    woke up during 1 colonscopy 3 yrs ago  . Diabetes mellitus without complication ([place])    type 2  . DM (diabetes mellitus) ([place])   . Family history of adverse reaction to anesthesia    mother slow to awaken after 1 procedure  . GERD (gastroesophageal reflux disease)   . Hypertension    hx of no bp meds for last 11 years after weight loss  . Pancreatitis 3-4 yrs ago and feb 2018  . Presence of artificial hip    has to take antibiotics prior to any procedure-dental, etc  . Ulcer     Patient Active Problem List   Diagnosis Date Noted  . Epigastric pain   . Alcohol-induced acute pancreatitis   . Abdominal pain 08/03/2016  . Encounter for health maintenance examination 10/22/2015  . Rheumatoid arthritis involving multiple sites with positive rheumatoid factor ([place]) 10/22/2015  . BMI 35.0-35.9,adult 09/23/2015  .  Elevated rheumatoid factor 09/23/2015  . History of adenomatous polyp of colon 09/23/2015  . Alopecia 08/18/2015  . Benign essential hypertension 08/18/2015  . Benign neoplasm of colon 08/18/2015  . Epigastric discomfort 08/18/2015  . GERD without esophagitis 08/18/2015  . Heartburn 08/18/2015  . High risk medication use 08/18/2015  . Hyperlipidemia 08/18/2015  . Hyponatremia 08/18/2015  . Infective otitis externa of left ear 08/18/2015  . Insomnia 08/18/2015  . Neuropathy of both feet 08/18/2015  . Nicotine dependence 08/18/2015  . Oligomenorrhea 08/18/2015  . Obesity 08/18/2015  . Gastro-esophageal reflux disease with esophagitis 08/18/2015  . Stress incontinence in female 08/18/2015  . Type 2 diabetes, uncontrolled, with neuropathy ([place]) 08/18/2015  . Uncontrolled diabetes mellitus with complications ([place]) [id number]  . Vitamin D deficiency 08/18/2015  . Wheezing 08/18/2015  . Colon polyps 01/06/2014  . Pancreatic mass 01/06/2014  . Pancreatitis 01/06/2014    Past Surgical History:  Procedure Laterality Date  . COLONOSCOPY WITH PROPOFOL N/A 02/11/2019   Procedure: COLONOSCOPY WITH PROPOFOL;  Surgeon: Lollie Sails, MD;  Location: [facility] ENDOSCOPY;  Service: Endoscopy;  Laterality: N/A;  . colonscopy     x 4  . ESOPHAGOGASTRODUODENOSCOPY (EGD) WITH PROPOFOL N/A 02/11/2019   Procedure: ESOPHAGOGASTRODUODENOSCOPY (EGD) WITH PROPOFOL;  Surgeon: Lollie Sails, MD;  Location: [facility] ENDOSCOPY;  Service: Endoscopy;  Laterality: N/A;  . EUS N/A 01/01/2017   Procedure: UPPER ENDOSCOPIC ULTRASOUND (EUS) LINEAR;  Surgeon: Milus Banister, MD;  Location: [facility] ENDOSCOPY;  Service: Endoscopy;  Laterality: N/A;  . FINE NEEDLE ASPIRATION  01/01/2017   Procedure: FINE NEEDLE ASPIRATION;  Surgeon: Milus Banister, MD;  Location: [facility] ENDOSCOPY;  Service: Endoscopy;;  . JOINT REPLACEMENT     hip replacement  . STRABISMUS SURGERY  12/12/2011   Procedure: REPAIR STRABISMUS;  Surgeon: Derry Skill, MD;  Location: [facility];  Service: Ophthalmology;  Laterality: Right;  . TOTAL HIP ARTHROPLASTY     bilat    Prior to Admission medications   Medication Sig Start Date End Date Taking? Authorizing Provider  albuterol (VENTOLIN HFA) 108 (90 Base) MCG/ACT inhaler Inhale 2 puffs into the lungs every 6 (six) hours as needed for wheezing or shortness of breath (8). 04/05/19   Arta Silence, MD  ARTIFICIAL TEAR OP Apply 1 drop to eye daily as needed (dry eyes).    [provider]  buPROPion (WELLBUTRIN XL) 300 MG 24 hr tablet Take 300 mg by mouth daily.  01/03/16   [provider]  cholecalciferol (VITAMIN D) 1000 units tablet Take 2,000 Units by mouth daily.    [provider]  ciprofloxacin (CIPRO) 500 MG tablet Take 1 tablet (500 mg total) by mouth 2 (two) times daily. Patient not taking: Reported on 02/10/2019 01/01/17   Milus Banister, MD  cyclobenzaprine (FLEXERIL) 10 MG tablet Take 1 tablet (10 mg total) by mouth 2 (two) times daily as needed for muscle spasms. Patient not taking: Reported on 02/17/2019 02/14/17   Lysbeth Penner, FNP  diphenhydramine-acetaminophen (TYLENOL PM EXTRA STRENGTH) 25-500 MG TABS tablet Take 2 tablets by mouth at bedtime as needed (sleep).    [provider]  folic acid (FOLVITE) 1 MG tablet Take 3 mg by mouth daily.    [provider]  HYDROcodone-acetaminophen (NORCO/VICODIN) 5-325 MG tablet TK 1 T PO Q 6 TO 8 H PNR P 02/22/17   [provider]  hydroxychloroquine (PLAQUENIL) 200 MG tablet Take 200 mg by mouth 2 (two) times daily. 08/24/18   [provider]  ibuprofen (ADVIL,MOTRIN) 200 MG tablet Take 400 mg by mouth 3 (three) times daily as needed for headache or moderate pain.    [provider]  ibuprofen (ADVIL,MOTRIN) 800 MG tablet Take 1 tablet (800 mg total) by mouth 3 (three) times daily. Patient not taking: Reported on 02/17/2019 02/14/17   Lysbeth Penner,  FNP  levonorgestrel (MIRENA) 20 MCG/24HR IUD 1 each by Intrauterine route once.     [provider]  lisinopril (ZESTRIL) 5 MG tablet Take 5 mg by mouth daily.     [provider]  metFORMIN (GLUCOPHAGE-XR) 500 MG 24 hr tablet Take 500mg s by mouth twice daily with meals 07/24/16   [provider]  METHOTREXATE PO Take by mouth.    [provider]  metoCLOPramide (REGLAN) 10 MG tablet Take 1 tablet (10 mg total) by mouth every 8 (eight) hours as needed for up to 5 days for nausea. 11/14/18 11/19/18  Arta Silence, MD  moxifloxacin (VIGAMOX) 0.5 % ophthalmic solution INT 1 GTT IN OU TID FOR 7 DAYS 02/17/17   [provider]  Multiple Vitamin (MULTIVITAMIN WITH MINERALS) TABS Take 1 tablet by mouth daily.    [provider]  naproxen (NAPROSYN) 500 MG tablet TK 1 T PO BID 02/22/17   [provider]  omeprazole (PRILOSEC) 20 MG capsule Take 1 capsule (20 mg total) by mouth daily. Patient taking differently: Take 20 mg by mouth daily as needed (acid  reflux).  11/10/16 01/10/17  Jonathon Bellows, MD  orphenadrine (NORFLEX) 100 MG tablet TK 1 T PO BID PRN 02/22/17   [provider]  pantoprazole (PROTONIX) 40 MG tablet Take 40 mg by mouth 2 (two) times daily before a meal.    [provider]  Potassium 99 MG TABS Take 99 mg by mouth daily.    [provider]  predniSONE (DELTASONE) 10 MG tablet TK 2 TS PO D 02/22/17   [provider]  Prenatal Vit-Fe Fumarate-FA (PRENATAL PO) Take 1 tablet by mouth daily as needed (vitamin supplementation).    [provider]  Tofacitinib Citrate (XELJANZ) 5 MG TABS Take 5 mg by mouth 2 (two) times daily.     [provider]  Turmeric 500 MG TABS Take 500 mg by mouth daily.    [provider]  valACYclovir (VALTREX) 1000 MG tablet Take 1,000 mg by mouth every 12 (twelve) hours. 09/20/18   [provider]    Allergies Patient has no known allergies.   Family History  Problem Relation Age of Onset  . Diabetes Mother   . Diabetes Father   . Hypertension Father     Social History Social History   Tobacco Use  . Smoking status: Current Some Day Smoker    Packs/day: 0.50  . Smokeless tobacco: Never Used  Substance Use Topics  . Alcohol use: Yes    Comment: occ  . Drug use: No    Review of Systems  Constitutional: No fever. Eyes: No redness. ENT: No sore throat. Cardiovascular: Positive for chest pain. Respiratory: Denies shortness of breath. Gastrointestinal: No vomiting. Genitourinary: Negative for flank pain.  Musculoskeletal: Negative for back pain. Skin: Negative for rash. Neurological: Negative for headache.   ____________________________________________   PHYSICAL EXAM:  VITAL SIGNS: ED Triage Vitals  Enc Vitals Group     BP 04/05/19 0835 (!) 144/94     Pulse Rate 04/05/19 0835 99     Resp 04/05/19 0835 16     Temp 04/05/19 0835 98.6 F (37 C)     Temp Source 04/05/19 0835 Oral     SpO2 04/05/19 0835 99 %     Weight 04/05/19 0836 157 lb (71.2 kg)     Height 04/05/19 0836 5' (1.524 m)     Head Circumference --      Peak Flow --      Pain Score 04/05/19 0836 6     Pain Loc --      Pain Edu? --      Excl. in [place]? --     Constitutional: Alert and oriented. Well appearing and in no acute distress. Eyes: Conjunctivae are normal.  Head: Atraumatic. Nose: No congestion/rhinnorhea. Mouth/Throat: Mucous membranes are moist.   Neck: Normal range of motion.  Cardiovascular: Borderline elevated rate, regular rhythm. Good peripheral circulation. Respiratory: Normal respiratory effort.  No retractions.  Gastrointestinal: No distention.  Musculoskeletal: No lower extremity edema.  No calf or popliteal swelling or tenderness.  Extremities warm and well perfused.  Neurologic:  Normal speech and language. No gross focal neurologic deficits are appreciated.  Skin:  Skin is warm and dry. No rash noted.  Psychiatric: Mood and affect are normal. Speech and behavior are normal.  ____________________________________________   LABS (all labs ordered are listed, but only abnormal results are displayed)  Labs Reviewed  BASIC METABOLIC PANEL - Abnormal; Notable for the following components:      Result Value   Glucose, Bld 155 (*)  Calcium 8.7 (*)    All other components within normal limits  TROPONIN I (HIGH SENSITIVITY) - Abnormal; Notable for the following components:   Troponin I (High Sensitivity) 22 (*)    All other components within normal limits  TROPONIN I (HIGH SENSITIVITY) - Abnormal; Notable for the following components:   Troponin I (High Sensitivity) 19 (*)    All other components within normal limits  CBC  POC URINE PREG, ED   ____________________________________________  EKG  ED ECG REPORT I, Arta Silence, the attending physician, personally viewed and interpreted this ECG.  Date: 04/05/2019 EKG Time: 0843 Rate: 100 Rhythm: normal sinus rhythm QRS Axis: normal Intervals: Prolonged QTc ST/T Wave abnormalities: normal Narrative Interpretation: no evidence of acute ischemia  ____________________________________________  RADIOLOGY  CXR: Subsegmental atelectasis versus infiltrate in right lung with  ____________________________________________   PROCEDURES  Procedure(s) performed: No  Procedures  Critical Care performed: No ____________________________________________   INITIAL IMPRESSION / ASSESSMENT AND PLAN / ED COURSE  Pertinent labs & imaging results that were available during my care of the patient were reviewed by me and considered in my medical decision making (see chart for details).  48 year old female with PMH as noted above presents with persistent chest pain, palpitations, and nausea and vomiting after trying to take metoprolol for the first time yesterday.    I reviewed the past medical records in [place].  The patient was seen  in the ED in late August with chest pain and had negative troponins and a negative CT angiogram.  She was evaluated by Dr. Saralyn Pilar yesterday for an initial cardiology consult.  She is scheduled for a stress echo, and she was prescribed metoprolol.  The patient reports that she tried to take it yesterday afternoon with food, but immediately vomited.  She tried to take it again in the evening and the same thing happened.  On exam today the patient is well-appearing.  Her vital signs are normal except that her heart rate is right around 100.  The remainder of the physical exam is unremarkable.  EKG shows no significant findings except for slightly elevated QTc.  At this time there is no evidence for ACS and this appears to be an exacerbation of the patient's now subacute chest pain.  However given the persistent borderline tachycardia she was prescribed the metoprolol, but was unable to take it yesterday.  We will obtain labs, chest x-ray, and then feed the patient and give her dose of metoprolol here.  If the patient is able to tolerated and the work-up is reassuring I will discharge her home.  If she continues to vomit with the metoprolol, I will discuss with cardiology to choose an alternative.  ----------------------------------------- 11:40 AM on 04/05/2019 -----------------------------------------  Infiltrate seen on chest x-ray is more consistent clinically with subsegmental atelectasis, as the patient has no cough, fever, elevated WBC count, or other signs or symptoms to suggest pneumonia clinically.  However, she does state now that a few times when she has had shortness of breath in the last few weeks, her partner noticed some wheezing.  She wonders whether her symptoms could possibly represent asthma or bronchitis.  I will prescribe an albuterol inhaler.  She has no wheezing or shortness of breath currently.  The initial troponin was minimally elevated.  There is no clinical evidence for ACS,  the patient has no concerning EKG findings.  Repeat troponin after 2 hours is trending down.  This could all be rate related.  The patient feels comfortable  and would like to go home.  She was able to tolerate the metoprolol here without difficulty.  I counseled her on the results of the work-up.  She is stable for discharge at this time.  Return precautions given, and she expresses understanding.  She has plans to follow-up for her stress echo this week and then with Dr. Saralyn Pilar next week.  ____________________________________________   FINAL CLINICAL IMPRESSION(S) / ED DIAGNOSES  Final diagnoses:  Atypical chest pain  Palpitations      NEW MEDICATIONS STARTED DURING THIS VISIT:  New Prescriptions   ALBUTEROL (VENTOLIN HFA) 108 (90 BASE) MCG/ACT INHALER    Inhale 2 puffs into the lungs every 6 (six) hours as needed for wheezing or shortness of breath (8).     Note:  This document was prepared using Dragon voice recognition software and may include unintentional dictation errors.    Arta Silence, MD 04/05/19 1143

## 2019-04-05 NOTE — H&P (Addendum)
[name] [place] NAME: April Chaney    MR#:  [id number]  DATE OF BIRTH:  1971-03-25  DATE OF ADMISSION:  04/05/2019  PRIMARY CARE PHYSICIAN: System, Pcp Not In   REQUESTING/REFERRING PHYSICIAN: Merlyn Lot, MD  CHIEF COMPLAINT:   Chief Complaint  Patient presents with  . Shortness of Breath   Chest discomfort and tachycardia for weeks, shortness of breath today. HISTORY OF PRESENT ILLNESS:  April Chaney  is a 48 y.o. female with a known history of hypertension, pancreatitis, diabetes, colon polyps and arthritis.  The patient presents the ED with above chief complaints.  She has had a tachycardia and palpitation for over a month.  She saw cardiologist yesterday and was given metoprolol.  She came to ED this morning and discharged to home but she still has palpitation, chest discomfort, also she has shortness of breath and orthopnea.  She is found hypoxia at 70s and placed on nonrebreather by EMS.  Now she is on 2 L oxygen by nasal cannula.  Chest x-ray report congestive heart failure.  She is treated with IV Lasix in the ED. PAST MEDICAL HISTORY:   Past Medical History:  Diagnosis Date  . Arthritis   . Colon polyps   . Complication of anesthesia    woke up during 1 colonscopy 3 yrs ago  . Diabetes mellitus without complication ([place])    type 2  . DM (diabetes mellitus) ([place])   . Family history of adverse reaction to anesthesia    mother slow to awaken after 1 procedure  . GERD (gastroesophageal reflux disease)   . Hypertension    hx of no bp meds for last 11 years after weight loss  . Pancreatitis 3-4 yrs ago and feb 2018  . Presence of artificial hip    has to take antibiotics prior to any procedure-dental, etc  . Ulcer     PAST SURGICAL HISTORY:   Past Surgical History:  Procedure Laterality Date  . COLONOSCOPY WITH PROPOFOL N/A 02/11/2019   Procedure: COLONOSCOPY WITH PROPOFOL;  Surgeon: Lollie Sails, MD;  Location: [facility] ENDOSCOPY;  Service: Endoscopy;  Laterality: N/A;  . colonscopy     x 4  . ESOPHAGOGASTRODUODENOSCOPY (EGD) WITH PROPOFOL N/A 02/11/2019   Procedure: ESOPHAGOGASTRODUODENOSCOPY (EGD) WITH PROPOFOL;  Surgeon: Lollie Sails, MD;  Location: [facility] ENDOSCOPY;  Service: Endoscopy;  Laterality: N/A;  . EUS N/A 01/01/2017   Procedure: UPPER ENDOSCOPIC ULTRASOUND (EUS) LINEAR;  Surgeon: Milus Banister, MD;  Location: [facility] ENDOSCOPY;  Service: Endoscopy;  Laterality: N/A;  . FINE NEEDLE ASPIRATION  01/01/2017   Procedure: FINE NEEDLE ASPIRATION;  Surgeon: Milus Banister, MD;  Location: [facility] ENDOSCOPY;  Service: Endoscopy;;  . JOINT REPLACEMENT     hip replacement  . STRABISMUS SURGERY  12/12/2011   Procedure: REPAIR STRABISMUS;  Surgeon: Derry Skill, MD;  Location: [place];  Service: Ophthalmology;  Laterality: Right;  . TOTAL HIP ARTHROPLASTY     bilat    SOCIAL HISTORY:   Social History   Tobacco Use  . Smoking status: Current Some Day Smoker    Packs/day: 0.50  . Smokeless tobacco: Never Used  Substance Use Topics  . Alcohol use: Yes    Comment: occ    FAMILY HISTORY:   Family History  Problem Relation Age of Onset  . Diabetes Mother   . Diabetes Father   . Hypertension Father     DRUG ALLERGIES:  No Known Allergies  REVIEW OF SYSTEMS:   Review of Systems  Constitutional: Positive for malaise/fatigue. Negative for chills and fever.  HENT: Negative for sore throat.   Eyes: Negative for blurred vision and double vision.  Respiratory: Positive for cough and shortness of breath. Negative for hemoptysis, sputum production, wheezing and stridor.   Cardiovascular: Positive for palpitations and orthopnea. Negative for chest pain and leg swelling.  Gastrointestinal: Negative for abdominal pain, blood in stool, diarrhea, melena, nausea and vomiting.  Genitourinary: Negative for dysuria, flank pain and hematuria.  Musculoskeletal:  Negative for back pain and joint pain.  Skin: Negative for rash.  Neurological: Negative for dizziness, sensory change, focal weakness, seizures, loss of consciousness, weakness and headaches.  Endo/Heme/Allergies: Negative for polydipsia.  Psychiatric/Behavioral: Negative for depression. The patient is not nervous/anxious.     MEDICATIONS AT HOME:   Prior to Admission medications   Medication Sig Start Date End Date Taking? Authorizing Provider  albuterol (VENTOLIN HFA) 108 (90 Base) MCG/ACT inhaler Inhale 2 puffs into the lungs every 6 (six) hours as needed for wheezing or shortness of breath (8). 04/05/19   Arta Silence, MD  ARTIFICIAL TEAR OP Apply 1 drop to eye daily as needed (dry eyes).    [provider]  buPROPion (WELLBUTRIN XL) 300 MG 24 hr tablet Take 300 mg by mouth daily.  01/03/16   [provider]  cholecalciferol (VITAMIN D) 1000 units tablet Take 2,000 Units by mouth daily.    [provider]  ciprofloxacin (CIPRO) 500 MG tablet Take 1 tablet (500 mg total) by mouth 2 (two) times daily. Patient not taking: Reported on 02/10/2019 01/01/17   Milus Banister, MD  cyclobenzaprine (FLEXERIL) 10 MG tablet Take 1 tablet (10 mg total) by mouth 2 (two) times daily as needed for muscle spasms. Patient not taking: Reported on 02/17/2019 02/14/17   Lysbeth Penner, FNP  diphenhydramine-acetaminophen (TYLENOL PM EXTRA STRENGTH) 25-500 MG TABS tablet Take 2 tablets by mouth at bedtime as needed (sleep).    [provider]  folic acid (FOLVITE) 1 MG tablet Take 3 mg by mouth daily.    [provider]  HYDROcodone-acetaminophen (NORCO/VICODIN) 5-325 MG tablet TK 1 T PO Q 6 TO 8 H PNR P 02/22/17   [provider]  hydroxychloroquine (PLAQUENIL) 200 MG tablet Take 200 mg by mouth 2 (two) times daily. 08/24/18   [provider]  ibuprofen (ADVIL,MOTRIN) 200 MG tablet Take 400 mg by mouth 3 (three) times daily as needed for headache  or moderate pain.    [provider]  ibuprofen (ADVIL,MOTRIN) 800 MG tablet Take 1 tablet (800 mg total) by mouth 3 (three) times daily. Patient not taking: Reported on 02/17/2019 02/14/17   Lysbeth Penner, FNP  levonorgestrel (MIRENA) 20 MCG/24HR IUD 1 each by Intrauterine route once.     [provider]  lisinopril (ZESTRIL) 5 MG tablet Take 5 mg by mouth daily.     [provider]  metFORMIN (GLUCOPHAGE-XR) 500 MG 24 hr tablet Take 500mg s by mouth twice daily with meals 07/24/16   [provider]  METHOTREXATE PO Take by mouth.    [provider]  metoCLOPramide (REGLAN) 10 MG tablet Take 1 tablet (10 mg total) by mouth every 8 (eight) hours as needed for up to 5 days for nausea. 11/14/18 11/19/18  Arta Silence, MD  moxifloxacin (VIGAMOX) 0.5 % ophthalmic solution INT 1 GTT IN OU TID FOR 7 DAYS 02/17/17   [provider]  Multiple Vitamin (MULTIVITAMIN WITH MINERALS) TABS Take 1 tablet by mouth daily.    [provider]  naproxen (NAPROSYN) 500 MG tablet TK 1 T PO BID 02/22/17   [provider]  omeprazole (PRILOSEC) 20 MG capsule Take 1 capsule (20 mg total) by mouth daily. Patient taking differently: Take 20 mg by mouth daily as needed (acid reflux).  11/10/16 01/10/17  Jonathon Bellows, MD  orphenadrine (NORFLEX) 100 MG tablet TK 1 T PO BID PRN 02/22/17   [provider]  pantoprazole (PROTONIX) 40 MG tablet Take 40 mg by mouth 2 (two) times daily before a meal.    [provider]  Potassium 99 MG TABS Take 99 mg by mouth daily.    [provider]  predniSONE (DELTASONE) 10 MG tablet TK 2 TS PO D 02/22/17   [provider]  Prenatal Vit-Fe Fumarate-FA (PRENATAL PO) Take 1 tablet by mouth daily as needed (vitamin supplementation).    [provider]  Tofacitinib Citrate (XELJANZ) 5 MG TABS Take 5 mg by mouth 2 (two) times daily.     [provider]  Turmeric 500 MG TABS Take  500 mg by mouth daily.    [provider]  valACYclovir (VALTREX) 1000 MG tablet Take 1,000 mg by mouth every 12 (twelve) hours. 09/20/18   [provider]      VITAL SIGNS:  Blood pressure (!) 152/109, pulse 100, temperature 99.4 F (37.4 C), temperature source Oral, resp. rate (!) 23, height 5' (1.524 m), weight 71.2 kg, SpO2 94 %.  PHYSICAL EXAMINATION:  Physical Exam  GENERAL:  48 y.o.-year-old patient lying in the bed with no acute distress.  EYES: Pupils equal, round, reactive to light and accommodation. No scleral icterus. Extraocular muscles intact.  HEENT: Head atraumatic, normocephalic. NECK:  Supple, no jugular venous distention. No thyroid enlargement, no tenderness.  LUNGS: Basilar Rales bilaterally, no wheezin, rhonchi or crepitation. No use of accessory muscles of respiration.  CARDIOVASCULAR: S1, S2 normal. No murmurs, rubs, or gallops.  ABDOMEN: Soft, nontender, nondistended. Bowel sounds present. No organomegaly or mass.  EXTREMITIES: No pedal edema, cyanosis, or clubbing.  NEUROLOGIC: Cranial nerves II through XII are intact. Muscle strength 5/5 in all extremities. Sensation intact. Gait not checked.  PSYCHIATRIC: The patient is alert and oriented x 3.  SKIN: No obvious rash, lesion, or ulcer.   LABORATORY PANEL:   CBC Recent Labs  Lab 04/05/19 1533  WBC 9.5  HGB 14.1  HCT 43.7  PLT 208   ------------------------------------------------------------------------------------------------------------------  Chemistries  Recent Labs  Lab 04/05/19 1533  NA 137  K 3.5  CL 102  CO2 23  GLUCOSE 186*  BUN 10  CREATININE 0.68  CALCIUM 9.1  AST 36  ALT 20  ALKPHOS 75  BILITOT 1.0   ------------------------------------------------------------------------------------------------------------------  Cardiac Enzymes No results for input(s): TROPONINI in the last 168 hours.  ------------------------------------------------------------------------------------------------------------------  RADIOLOGY:  Dg Chest 2 View  Result Date: 04/05/2019 CLINICAL DATA:  Chest pain and palpitations for 1 month. EXAM: CHEST - 2 VIEW COMPARISON:  02/17/2019 FINDINGS: The heart size and mediastinal contours are within normal limits. New mild streaky opacity is seen in the right lung base, which may be due to mild subsegmental atelectasis or early infiltrate. No evidence of pleural effusion. The visualized skeletal structures are unremarkable. IMPRESSION: New mild subsegmental atelectasis versus early infiltrate in right lung base. Electronically Signed   By: Marlaine Hind M.D.   On: 04/05/2019 09:00   Dg Chest  Portable 1 View  Result Date: 04/05/2019 CLINICAL DATA:  Shortness of breath EXAM: PORTABLE CHEST 1 VIEW COMPARISON:  April 05, 2019 FINDINGS: There is cardiomegaly with pulmonary venous hypertension. There is interstitial edema with questionable small right pleural effusion. There is no frank consolidation. No adenopathy. No pneumothorax. No bone lesions. IMPRESSION: Cardiomegaly with pulmonary vascular congestion. There is a degree of interstitial edema with small right pleural effusion. These findings are felt to be indicative of a degree of congestive heart failure. No consolidation. No adenopathy evident. Electronically Signed   By: Lowella Grip III M.D.   On: 04/05/2019 16:09      IMPRESSION AND PLAN:   Acute new onset CHF. The patient will be admitted to medical floor with telemetry monitor. Start CHF protocol, Lasix 40 mg IV every 12 hours, echocardiograph and cardiology consult.  Hypertension.  Continue Lasix and lisinopril. Diabetes.  Start sliding scale. Tobacco abuse.  Smoking cessation was counseled for 3 to 4 minutes, nicotine patch.  All the records are reviewed and case discussed with ED provider. Management plans discussed with the patient, family  and they are in agreement.  CODE STATUS: Full code.  TOTAL TIME TAKING CARE OF THIS PATIENT: 58 minutes.    Demetrios Loll M.D on 04/05/2019 at 6:23 PM  Between 7am to 6pm - Pager - [phone]  After 6pm go to [email or web address] - Proofreader  [facility]  Office  [phone]  CC: Primary care physician; System, Pcp Not In   Note: This dictation was prepared with Dragon dictation along with smaller phrase technology. Any transcriptional errors that result from this process are unin

## 2019-04-05 NOTE — ED Notes (Signed)
Patient transported to X-ray 

## 2019-04-06 ENCOUNTER — Inpatient Hospital Stay
Admit: 2019-04-06 | Discharge: 2019-04-06 | Disposition: A | Discharge status: Home / Self Care | Payer: BC Managed Care – PPO | Attending: Internal Medicine | Admitting: Internal Medicine

## 2019-04-06 LAB — BASIC METABOLIC PANEL
Anion gap: 14 (ref 5–15)
BUN: 15 mg/dL (ref 6–20)
CO2: 23 mmol/L (ref 22–32)
Calcium: 8.7 mg/dL — ABNORMAL LOW (ref 8.9–10.3)
Chloride: 100 mmol/L (ref 98–111)
Creatinine, Ser: 0.9 mg/dL (ref 0.44–1.00)
GFR calc Af Amer: >60 mL/min (ref >60)
GFR calc non Af Amer: >60 mL/min (ref >60)
Glucose, Bld: 182 mg/dL — ABNORMAL HIGH (ref 70–99)
Potassium: 3.8 mmol/L (ref 3.5–5.1)
Sodium: 137 mmol/L (ref 135–145)

## 2019-04-06 LAB — GLUCOSE, CAPILLARY
Glucose-Capillary: 128 mg/dL — ABNORMAL HIGH (ref 70–99)
Glucose-Capillary: 142 mg/dL — ABNORMAL HIGH (ref 70–99)
Glucose-Capillary: 176 mg/dL — ABNORMAL HIGH (ref 70–99)
Glucose-Capillary: 198 mg/dL — ABNORMAL HIGH (ref 70–99)

## 2019-04-06 LAB — HIV ANTIBODY (ROUTINE TESTING W REFLEX): HIV Screen 4th Generation wRfx: NONREACTIVE

## 2019-04-06 LAB — MAGNESIUM: Magnesium: 1.7 mg/dL (ref 1.7–2.4)

## 2019-04-06 MED ORDER — FAMOTIDINE 20 MG PO TABS
20.0000 mg | ORAL_TABLET | Freq: Two times a day (BID) | ORAL | Status: DC
  Administered 2019-04-06: 20 mg via ORAL
  Filled 2019-04-06: qty 1

## 2019-04-06 NOTE — ED Notes (Signed)
Pt given breakfast tray

## 2019-04-06 NOTE — ED Notes (Signed)
Breakfast tray provided. 

## 2019-04-06 NOTE — ED Notes (Signed)
Report given to Mosetta Pigeon RN

## 2019-04-06 NOTE — ED Notes (Signed)
Pt laying in bed comfortably talking on cell phone at this time. Pt states she is feeling a lot better. Denies any pain at this time. Will continue to monitor.

## 2019-04-06 NOTE — ED Notes (Signed)
Pt requested to take off nasal cannula. Pt O2 sat 99% on RA

## 2019-04-06 NOTE — ED Notes (Signed)
Report received, care of pt assumed.  Pt resting quietly on ER stretcher, pt voices no needs at this time.  Will continue to monitor.

## 2019-04-06 NOTE — Progress Notes (Signed)
[place] at [place] NAME: April Chaney    MR#:  [id number]  DATE OF BIRTH:  01/15/1971  SUBJECTIVE:   Patient reports shortness of breath is much improved since yesterday.  REVIEW OF SYSTEMS:    Review of Systems  Constitutional: Negative for fever, chills weight loss HENT: Negative for ear pain, nosebleeds, congestion, facial swelling, rhinorrhea, neck pain, neck stiffness and ear discharge.   Respiratory: Negative for cough, ++ shortness of breath, no wheezing  Cardiovascular: Negative for chest pain, ++palpitations and leg swelling.  Gastrointestinal: Negative for heartburn, abdominal pain, vomiting, diarrhea or consitpation Genitourinary: Negative for dysuria, urgency, frequency, hematuria Musculoskeletal: Negative for back pain or joint pain Neurological: Negative for dizziness, seizures, syncope, focal weakness,  numbness and headaches.  Hematological: Does not bruise/bleed easily.  Psychiatric/Behavioral: Negative for hallucinations, confusion, dysphoric mood    Tolerating Diet: yes      DRUG ALLERGIES:   Allergies  Allergen Reactions  . Pantoprazole Rash    VITALS:  Blood pressure (!) 141/97, pulse 92, temperature 98 F (36.7 C), temperature source Oral, resp. rate 20, height 5' (1.524 m), weight 71.2 kg, SpO2 99 %.  PHYSICAL EXAMINATION:  Constitutional: Appears well-developed and well-nourished. No distress. HENT: Normocephalic. Marland Kitchen Oropharynx is clear and moist.  Eyes: Conjunctivae and EOM are normal. PERRLA, no scleral icterus.  Neck: Normal ROM. Neck supple. No JVD. No tracheal deviation. CVS: RRR, S1/S2 +, no murmurs, no gallops, no carotid bruit.  Pulmonary: Effort and breath sounds normal, no stridor, rhonchi, wheezes, rales.  Abdominal: Soft. BS +,  no distension, tenderness, rebound or guarding.  Musculoskeletal: Normal range of motion. No edema and no tenderness.  Neuro: Alert. CN 2-12 grossly  intact. No focal deficits. Skin: Skin is warm and dry. No rash noted. Psychiatric: Normal mood and affect.      LABORATORY PANEL:   CBC Recent Labs  Lab 04/05/19 1533  WBC 9.5  HGB 14.1  HCT 43.7  PLT 208   ------------------------------------------------------------------------------------------------------------------  Chemistries  Recent Labs  Lab 04/05/19 1533 04/06/19 0615  NA 137 137  K 3.5 3.8  CL 102 100  CO2 23 23  GLUCOSE 186* 182*  BUN 10 15  CREATININE 0.68 0.90  CALCIUM 9.1 8.7*  MG  --  1.7  AST 36  --   ALT 20  --   ALKPHOS 75  --   BILITOT 1.0  --    ------------------------------------------------------------------------------------------------------------------  Cardiac Enzymes No results for input(s): TROPONINI in the last 168 hours. ------------------------------------------------------------------------------------------------------------------  RADIOLOGY:  Dg Chest 2 View  Result Date: 04/05/2019 CLINICAL DATA:  Chest pain and palpitations for 1 month. EXAM: CHEST - 2 VIEW COMPARISON:  02/17/2019 FINDINGS: The heart size and mediastinal contours are within normal limits. New mild streaky opacity is seen in the right lung base, which may be due to mild subsegmental atelectasis or early infiltrate. No evidence of pleural effusion. The visualized skeletal structures are unremarkable. IMPRESSION: New mild subsegmental atelectasis versus early infiltrate in right lung base. Electronically Signed   By: Marlaine Hind M.D.   On: 04/05/2019 09:00   Dg Chest Portable 1 View  Result Date: 04/05/2019 CLINICAL DATA:  Shortness of breath EXAM: PORTABLE CHEST 1 VIEW COMPARISON:  April 05, 2019 FINDINGS: There is cardiomegaly with pulmonary venous hypertension. There is interstitial edema with questionable small right pleural effusion. There is no frank consolidation. No adenopathy. No pneumothorax. No bone lesions. IMPRESSION: Cardiomegaly with pulmonary  vascular congestion.  There is a degree of interstitial edema with small right pleural effusion. These findings are felt to be indicative of a degree of congestive heart failure. No consolidation. No adenopathy evident. Electronically Signed   By: Lowella Grip III M.D.   On: 04/05/2019 16:09     ASSESSMENT AND PLAN:   48 year old female with history of diabetes who presented to the emergency room due to shortness of breath as well as palpitations.  1.  Acute congestive heart failure: Follow-up on echocardiogram to evaluate systolic versus diastolic heart failure Continue IV Lasix Monitor intake and output Daily weight Will need CHF clinic upon discharge Further work-up as per cardiology Consider beta-blocker once patient is euvolemic  2. Tobacco dependence: Patient is encouraged to quit smoking and willing to attempt to quit was assessed. Patient highly motivated.Counseling was provided for 4 minutes. Continue nicotine patch  3.Diabetes: Continue sliding scale A1c 6.7 4.  Hypertension: Continue lisinopril   5.  Depression: Continue Wellbutrin   Management plans discussed with the patient and she is in agreement.  CODE STATUS: full  TOTAL TIME TAKING CARE OF THIS PATIENT: 30 minutes.     POSSIBLE D/C tomorrow, DEPENDING ON CLINICAL CONDITION.   Bettey Costa M.D on 04/06/2019 at 12:17 PM  Between 7am to 6pm - Pager - [phone] After 6pm go to [email or web address] - password [email or web address] [facility]  Office  [phone]  CC: Primary care physician; System, Pcp Not In  Note: This dictation was prepared with Dragon dictation along with smaller phrase technology. Any transcriptional errors that result from this process are unintentional.

## 2019-04-06 NOTE — ED Notes (Signed)
Pt awakens easily for am labs; ambulatory to BR to void without difficulty or distress noted; pt denies any c/o at present and voices good understanding of plan of care; awaiting admission to inpatient hospital room; card monitor in place with O2 at 2l/min via [place]

## 2019-04-06 NOTE — Progress Notes (Signed)
*  PRELIMINARY RESULTS* Echocardiogram 2D Echocardiogram has been performed.  Sherrie Sport 04/06/2019, 10:13 AM

## 2019-04-06 NOTE — ED Notes (Signed)
Pt uprite on stretcher in darkened exam room, eyes closed with no distress noted; resp even/unlab

## 2019-04-07 ENCOUNTER — Emergency Department (HOSPITAL_COMMUNITY): Payer: BC Managed Care – PPO

## 2019-04-07 ENCOUNTER — Encounter (HOSPITAL_COMMUNITY): Payer: Self-pay | Admitting: Emergency Medicine

## 2019-04-07 ENCOUNTER — Other Ambulatory Visit: Payer: Self-pay

## 2019-04-07 ENCOUNTER — Emergency Department (HOSPITAL_COMMUNITY)
Admission: EM | Admit: 2019-04-07 | Discharge: 2019-04-08 | Disposition: A | Discharge status: Home / Self Care | Payer: BC Managed Care – PPO | Attending: Emergency Medicine | Admitting: Emergency Medicine

## 2019-04-07 DIAGNOSIS — E119 Type 2 diabetes mellitus without complications: Secondary | ICD-10-CM | POA: Diagnosis not present

## 2019-04-07 DIAGNOSIS — Z7984 Long term (current) use of oral hypoglycemic drugs: Secondary | ICD-10-CM | POA: Insufficient documentation

## 2019-04-07 DIAGNOSIS — I509 Heart failure, unspecified: Secondary | ICD-10-CM | POA: Insufficient documentation

## 2019-04-07 DIAGNOSIS — I110 Hypertensive heart disease with heart failure: Secondary | ICD-10-CM | POA: Diagnosis not present

## 2019-04-07 DIAGNOSIS — Z79899 Other long term (current) drug therapy: Secondary | ICD-10-CM | POA: Diagnosis not present

## 2019-04-07 DIAGNOSIS — F17210 Nicotine dependence, cigarettes, uncomplicated: Secondary | ICD-10-CM | POA: Insufficient documentation

## 2019-04-07 DIAGNOSIS — Z9289 Personal history of other medical treatment: Secondary | ICD-10-CM

## 2019-04-07 DIAGNOSIS — Z0000 Encounter for general adult medical examination without abnormal findings: Secondary | ICD-10-CM

## 2019-04-07 DIAGNOSIS — I10 Essential (primary) hypertension: Secondary | ICD-10-CM | POA: Diagnosis present

## 2019-04-07 LAB — CBC WITH DIFFERENTIAL/PLATELET
Abs Immature Granulocytes: 0.03 10*3/uL (ref 0.00–0.07)
Basophils Absolute: 0 10*3/uL (ref 0.0–0.1)
Basophils Relative: 0 %
Eosinophils Absolute: 0 10*3/uL (ref 0.0–0.5)
Eosinophils Relative: 0 %
HCT: 30.4 % — ABNORMAL LOW (ref 36.0–46.0)
Hemoglobin: 9.9 g/dL — ABNORMAL LOW (ref 12.0–15.0)
Immature Granulocytes: 1 %
Lymphocytes Relative: 31 %
Lymphs Abs: 1.4 10*3/uL (ref 0.7–4.0)
MCH: 30.3 pg (ref 26.0–34.0)
MCHC: 32.6 g/dL (ref 30.0–36.0)
MCV: 93 fL (ref 80.0–100.0)
Monocytes Absolute: 0.4 10*3/uL (ref 0.1–1.0)
Monocytes Relative: 8 %
Neutro Abs: 2.7 10*3/uL (ref 1.7–7.7)
Neutrophils Relative %: 60 %
Platelets: 70 10*3/uL — ABNORMAL LOW (ref 150–400)
RBC: 3.27 MIL/uL — ABNORMAL LOW (ref 3.87–5.11)
RDW: 15.1 % (ref 11.5–15.5)
WBC: 4.6 10*3/uL (ref 4.0–10.5)
nRBC: 0 % (ref 0.0–0.2)

## 2019-04-07 LAB — BASIC METABOLIC PANEL
Anion gap: 13 (ref 5–15)
BUN: 23 mg/dL — ABNORMAL HIGH (ref 6–20)
CO2: 19 mmol/L — ABNORMAL LOW (ref 22–32)
Calcium: 8.8 mg/dL — ABNORMAL LOW (ref 8.9–10.3)
Chloride: 105 mmol/L (ref 98–111)
Creatinine, Ser: 0.77 mg/dL (ref 0.44–1.00)
GFR calc Af Amer: >60 mL/min (ref >60)
GFR calc non Af Amer: >60 mL/min (ref >60)
Glucose, Bld: 91 mg/dL (ref 70–99)
Potassium: 4.3 mmol/L (ref 3.5–5.1)
Sodium: 137 mmol/L (ref 135–145)

## 2019-04-07 LAB — BRAIN NATRIURETIC PEPTIDE: B Natriuretic Peptide: 244.4 pg/mL — ABNORMAL HIGH (ref 0.0–100.0)

## 2019-04-07 LAB — ECHOCARDIOGRAM COMPLETE
Height: 60 in
Weight: 2512.01 oz

## 2019-04-07 NOTE — ED Provider Notes (Signed)
[facility] EMERGENCY DEPARTMENT Provider Note   CSN: [id number] Arrival date & time: 04/07/19  1646     History   Chief Complaint Chief Complaint  Patient presents with   Hypertension    HPI April Chaney is a 48 y.o. female.     The history is provided by the patient and medical records. No language interpreter was used.  Hypertension     48 year old female recently hospitalized for acute respiratory failure secondary CHF left AMA 2 days ago, presenting here requesting for work note to return back to work.  Patient was admitted to the hospital due to hypoxia secondary to new onset heart failure.  An echocardiogram performed 2 days ago showed an ejection fraction of 35 to 40% she was giving diuretic during hospitalization and felt better.  She report leaving AMA because she was upset about treatment at the facility.  States that she was placed in a floor that houses psychiatric patients which upsets her.  She mention feeling better and felt comfortable returning to work.  She mention improve work of breathing.  States that she has follow-up with cardiologist several days ago and was prescribed blood pressure medication to regulate her heart rate as well as diuretic pill.  Patient mention her supervisor called and states patient has to come back to the hospital to be medically cleared before returning to work.  She is here requesting for work note to return to work.  She denies any fever chills nausea vomiting or diarrhea.  Past Medical History:  Diagnosis Date   Arthritis    Colon polyps    Complication of anesthesia    woke up during 1 colonscopy 3 yrs ago   Diabetes mellitus without complication ([place])    type 2   DM (diabetes mellitus) ([place])    Family history of adverse reaction to anesthesia    mother slow to awaken after 1 procedure   GERD (gastroesophageal reflux disease)    Hypertension    hx of no bp meds for last 11 years after  weight loss   Pancreatitis 3-4 yrs ago and feb 2018   Presence of artificial hip    has to take antibiotics prior to any procedure-dental, etc   Ulcer     Patient Active Problem List   Diagnosis Date Noted   Acute CHF (congestive heart failure) ([place]) 04/05/2019   Epigastric pain    Alcohol-induced acute pancreatitis    Abdominal pain 08/03/2016   Encounter for health maintenance examination 10/22/2015   Rheumatoid arthritis involving multiple sites with positive rheumatoid factor ([place]) 10/22/2015   BMI 35.0-35.9,adult 09/23/2015   Elevated rheumatoid factor 09/23/2015   History of adenomatous polyp of colon 09/23/2015   Alopecia 08/18/2015   Benign essential hypertension 08/18/2015   Benign neoplasm of colon 08/18/2015   Epigastric discomfort 08/18/2015   GERD without esophagitis 08/18/2015   Heartburn 08/18/2015   High risk medication use 08/18/2015   Hyperlipidemia 08/18/2015   Hyponatremia 08/18/2015   Infective otitis externa of left ear 08/18/2015   Insomnia 08/18/2015   Neuropathy of both feet 08/18/2015   Nicotine dependence 08/18/2015   Oligomenorrhea 08/18/2015   Obesity 08/18/2015   Gastro-esophageal reflux disease with esophagitis 08/18/2015   Stress incontinence in female 08/18/2015   Type 2 diabetes, uncontrolled, with neuropathy ([place]) 08/18/2015   Uncontrolled diabetes mellitus with complications ([place]) 99991111   Vitamin D deficiency 08/18/2015   Wheezing 08/18/2015   Colon polyps 01/06/2014   Pancreatic mass 01/06/2014  Pancreatitis 01/06/2014    Past Surgical History:  Procedure Laterality Date   COLONOSCOPY WITH PROPOFOL N/A 02/11/2019   Procedure: COLONOSCOPY WITH PROPOFOL;  Surgeon: Lollie Sails, MD;  Location: [facility] ENDOSCOPY;  Service: Endoscopy;  Laterality: N/A;   colonscopy     x 4   ESOPHAGOGASTRODUODENOSCOPY (EGD) WITH PROPOFOL N/A 02/11/2019   Procedure: ESOPHAGOGASTRODUODENOSCOPY (EGD) WITH  PROPOFOL;  Surgeon: Lollie Sails, MD;  Location: [facility] ENDOSCOPY;  Service: Endoscopy;  Laterality: N/A;   EUS N/A 01/01/2017   Procedure: UPPER ENDOSCOPIC ULTRASOUND (EUS) LINEAR;  Surgeon: Milus Banister, MD;  Location: [facility] ENDOSCOPY;  Service: Endoscopy;  Laterality: N/A;   FINE NEEDLE ASPIRATION  01/01/2017   Procedure: FINE NEEDLE ASPIRATION;  Surgeon: Milus Banister, MD;  Location: [facility] ENDOSCOPY;  Service: Endoscopy;;   JOINT REPLACEMENT     hip replacement   STRABISMUS SURGERY  12/12/2011   Procedure: REPAIR STRABISMUS;  Surgeon: Derry Skill, MD;  Location: [facility];  Service: Ophthalmology;  Laterality: Right;   TOTAL HIP ARTHROPLASTY     bilat     OB History   No obstetric history on file.      Home Medications    Prior to Admission medications   Medication Sig Start Date End Date Taking? Authorizing Provider  albuterol (VENTOLIN HFA) 108 (90 Base) MCG/ACT inhaler Inhale 2 puffs into the lungs every 6 (six) hours as needed for wheezing or shortness of breath (8). 04/05/19   Arta Silence, MD  ARTIFICIAL TEAR OP Apply 1 drop to eye daily as needed (dry eyes).    [provider]  buPROPion (WELLBUTRIN XL) 300 MG 24 hr tablet Take 300 mg by mouth daily.  01/03/16   [provider]  cholecalciferol (VITAMIN D) 1000 units tablet Take 2,000 Units by mouth daily.    [provider]  ciprofloxacin (CIPRO) 500 MG tablet Take 1 tablet (500 mg total) by mouth 2 (two) times daily. Patient not taking: Reported on 02/10/2019 01/01/17   Milus Banister, MD  cyclobenzaprine (FLEXERIL) 10 MG tablet Take 1 tablet (10 mg total) by mouth 2 (two) times daily as needed for muscle spasms. Patient not taking: Reported on 02/17/2019 02/14/17   Lysbeth Penner, FNP  diphenhydramine-acetaminophen (TYLENOL PM EXTRA STRENGTH) 25-500 MG TABS tablet Take 2 tablets by mouth at bedtime as needed (sleep).    [provider]  etodolac  (LODINE) 400 MG tablet Take 400 mg by mouth 2 (two) times daily. 03/31/19   [provider]  folic acid (FOLVITE) 1 MG tablet Take 3 mg by mouth daily.    [provider]  HYDROcodone-acetaminophen (NORCO/VICODIN) 5-325 MG tablet TK 1 T PO Q 6 TO 8 H PNR P 02/22/17   [provider]  hydroxychloroquine (PLAQUENIL) 200 MG tablet Take 200 mg by mouth 2 (two) times daily. 08/24/18   [provider]  ibuprofen (ADVIL,MOTRIN) 200 MG tablet Take 400 mg by mouth 3 (three) times daily as needed for headache or moderate pain.    [provider]  ibuprofen (ADVIL,MOTRIN) 800 MG tablet Take 1 tablet (800 mg total) by mouth 3 (three) times daily. Patient not taking: Reported on 02/17/2019 02/14/17   Lysbeth Penner, FNP  levonorgestrel (MIRENA) 20 MCG/24HR IUD 1 each by Intrauterine route once.     [provider]  lisinopril (ZESTRIL) 10 MG tablet Take 10 mg by mouth daily. 03/28/19   [provider]  metFORMIN (GLUCOPHAGE-XR) 500 MG 24 hr  tablet Take 500mg s by mouth twice daily with meals 07/24/16   [provider]  METHOTREXATE PO Take by mouth.    [provider]  metoCLOPramide (REGLAN) 10 MG tablet Take 1 tablet (10 mg total) by mouth every 8 (eight) hours as needed for up to 5 days for nausea. 11/14/18 11/19/18  Arta Silence, MD  metoprolol succinate (TOPROL-XL) 50 MG 24 hr tablet Take 50 mg by mouth daily. 04/04/19 04/03/20  [provider]  moxifloxacin (VIGAMOX) 0.5 % ophthalmic solution INT 1 GTT IN OU TID FOR 7 DAYS 02/17/17   [provider]  Multiple Vitamin (MULTIVITAMIN WITH MINERALS) TABS Take 1 tablet by mouth daily.    [provider]  naproxen (NAPROSYN) 500 MG tablet TK 1 T PO BID 02/22/17   [provider]  omeprazole (PRILOSEC) 40 MG capsule Take 40 mg by mouth 2 (two) times daily before a meal. 03/07/19 03/06/20  [provider]  orphenadrine (NORFLEX) 100 MG tablet TK 1  T PO BID PRN 02/22/17   [provider]  Potassium 99 MG TABS Take 99 mg by mouth daily.    [provider]  predniSONE (DELTASONE) 10 MG tablet TK 2 TS PO D 02/22/17   [provider]  Prenatal Vit-Fe Fumarate-FA (PRENATAL PO) Take 1 tablet by mouth daily as needed (vitamin supplementation).    [provider]  Tofacitinib Citrate (XELJANZ) 5 MG TABS Take 5 mg by mouth 2 (two) times daily.     [provider]  Turmeric 500 MG TABS Take 500 mg by mouth daily.    [provider]  valACYclovir (VALTREX) 1000 MG tablet Take 1,000 mg by mouth every 12 (twelve) hours. 09/20/18   [provider]    Family History Family History  Problem Relation Age of Onset   Diabetes Mother    Diabetes Father    Hypertension Father     Social History Social History   Tobacco Use   Smoking status: Current Some Day Smoker    Packs/day: 0.50   Smokeless tobacco: Never Used  Substance Use Topics   Alcohol use: Yes    Comment: occ   Drug use: No     Allergies   Pantoprazole   Review of Systems Review of Systems  All other systems reviewed and are negative.    Physical Exam Updated Vital Signs BP 139/83 (BP Location: Right Arm)    Pulse 85    Temp 97.7 F (36.5 C) (Oral)    Resp 19    SpO2 99%   Physical Exam Vitals signs and nursing note reviewed.  Constitutional:      General: She is not in acute distress.    Appearance: She is well-developed. She is obese.  HENT:     Head: Atraumatic.  Eyes:     Conjunctiva/sclera: Conjunctivae normal.  Neck:     Musculoskeletal: Neck supple.     Comments: No JVD Cardiovascular:     Rate and Rhythm: Normal rate and regular rhythm.     Pulses: Normal pulses.     Heart sounds: Normal heart sounds.  Pulmonary:     Effort: Pulmonary effort is normal.     Breath sounds: Normal breath sounds. No rales.  Abdominal:     Palpations: Abdomen is soft.  Musculoskeletal:        General:  No swelling.  Skin:    Findings: No rash.  Neurological:     Mental Status: She is alert.  ED Treatments / Results  Labs (all labs ordered are listed, but only abnormal results are displayed) Labs Reviewed  BASIC METABOLIC PANEL - Abnormal; Notable for the following components:      Result Value   CO2 19 (*)    BUN 23 (*)    Calcium 8.8 (*)    All other components within normal limits  CBC WITH DIFFERENTIAL/PLATELET - Abnormal; Notable for the following components:   RBC 3.27 (*)    Hemoglobin 9.9 (*)    HCT 30.4 (*)    Platelets 70 (*)    All other components within normal limits  BRAIN NATRIURETIC PEPTIDE  POC OCCULT BLOOD, ED    EKG EKG Interpretation  Date/Time:  Thursday April 07 2019 19:26:47 EDT Ventricular Rate:  85 PR Interval:    QRS Duration: 96 QT Interval:  423 QTC Calculation: 503 R Axis:   63 Text Interpretation:  Sinus rhythm Borderline prolonged QT interval Confirmed by Sherwood Gambler [phone]) on 04/07/2019 7:30:44 PM   ED ECG REPORT   Date: 04/07/2019  Rate: 85  Rhythm: normal sinus rhythm  QRS Axis: normal  Intervals: QT prolonged  ST/T Wave abnormalities: normal  Conduction Disutrbances:none  Narrative Interpretation:   Old EKG Reviewed: unchanged  I have personally reviewed the EKG tracing and agree with the computerized printout as noted.   Radiology Dg Chest Port 1 View  Result Date: 04/07/2019 CLINICAL DATA:  48 year old female with CHF. EXAM: PORTABLE CHEST 1 VIEW COMPARISON:  Chest radiograph dated 04/05/2019 FINDINGS: Stable mild cardiomegaly. Significant interval improvement of the vascular congestion since the prior radiograph. No focal consolidation, pleural effusion, or pneumothorax. No acute osseous pathology. IMPRESSION: Near complete resolution of the previously seen vascular congestion. No focal consolidation. Electronically Signed   By: Anner Crete M.D.   On: 04/07/2019 19:58   Dg Shoulder Left  Result  Date: 04/07/2019 CLINICAL DATA:  48 year old female status post fall on the left shoulder 3 weeks ago with continued pain. EXAM: LEFT SHOULDER - 2+ VIEW COMPARISON:  Chest radiographs 04/05/2019 and earlier. FINDINGS: Bone mineralization is within normal limits. No glenohumeral joint dislocation. No fracture of the proximal left humerus is identified. The left clavicle, scapula and visible left ribs also appear intact. IMPRESSION: No acute fracture or dislocation identified about the left shoulder. Electronically Signed   By: Genevie Ann M.D.   On: 04/07/2019 20:16    Procedures Procedures (including critical care time)  Medications Ordered in ED Medications - No data to display   Initial Impression / Assessment and Plan / ED Course  I have reviewed the triage vital signs and the nursing notes.  Pertinent labs & imaging results that were available during my care of the patient were reviewed by me and considered in my medical decision making (see chart for details).        BP (!) 155/75 (BP Location: Left Arm)    Pulse 82    Temp 97.7 F (36.5 C) (Oral)    Resp 18    SpO2 98%    Final Clinical Impressions(s) / ED Diagnoses   Final diagnoses:  None    ED Discharge Orders    None     6:47 PM Patient with newly diagnosed CHF with an EF of 30-40% who left AMA in 2 days ago from the hospital is presenting here requesting for work note to return to work.  At this time she is in no acute respiratory discomfort able to speak in complete sentences  and does not appear to be fluid overload.  Will perform screening test including recheck BNP, chest x-ray, EKG, and basic labs.  Long delay on labs as pt is a difficult stick.  Pt resting comfortably.   10:23 PM Pt sign out to [employer], PA-C who will f/u on pt's labs and determine disposition.    Domenic Moras, PA-C 04/07/19 2224    Sherwood Gambler, MD 04/08/19 1128

## 2019-04-07 NOTE — Discharge Summary (Signed)
This patient was admitted on April 05, 2019. She was admitted for new acute congestive heart failure.  She left AMA on the evening of October 13.

## 2019-04-07 NOTE — ED Triage Notes (Signed)
Pt arrives to [facility] ED after eloping from [facility] last night - pt states she felt ready to go 'because I felt fine" however she went back to work today and was told she cant come back unless she comes back to hospital and gets "cleared for work". Pt states she has no complaints

## 2019-04-07 NOTE — ED Notes (Signed)
Patient back from  X-ray 

## 2019-04-07 NOTE — ED Provider Notes (Signed)
48 year old female received at signout from [facility] pending labs.  Per his HPI:  "48 year old female recently hospitalized for acute respiratory failure secondary CHF left AMA 2 days ago, presenting here requesting for work note to return back to work.  Patient was admitted to the hospital due to hypoxia secondary to new onset heart failure.  An echocardiogram performed 2 days ago showed an ejection fraction of 35 to 40% she was giving diuretic during hospitalization and felt better.  She report leaving AMA because she was upset about treatment at the facility.  States that she was placed in a floor that houses psychiatric patients which upsets her.  She mention feeling better and felt comfortable returning to work.  She mention improve work of breathing.  States that she has follow-up with cardiologist several days ago and was prescribed blood pressure medication to regulate her heart rate as well as diuretic pill.  Patient mention her supervisor called and states patient has to come back to the hospital to be medically cleared before returning to work.  She is here requesting for work note to return to work.  She denies any fever chills nausea vomiting or diarrhea."   Physical Exam  BP 139/83 (BP Location: Right Arm)   Pulse 85   Temp 97.7 F (36.5 C) (Oral)   Resp 19   SpO2 99%   Physical Exam Vitals signs and nursing note reviewed.  Constitutional:      General: She is not in acute distress.    Appearance: She is not ill-appearing, toxic-appearing or diaphoretic.  HENT:     Head: Normocephalic.  Eyes:     Conjunctiva/sclera: Conjunctivae normal.  Neck:     Musculoskeletal: Neck supple.  Cardiovascular:     Rate and Rhythm: Normal rate and regular rhythm.     Heart sounds: No murmur. No friction rub. No gallop.   Pulmonary:     Effort: Pulmonary effort is normal. No respiratory distress.     Breath sounds: No stridor. No wheezing, rhonchi or rales.     Comments: Lungs are clear to  auscultation bilaterally.  No increased work of breathing.  Speaks in complete, fluent sentences.  Chest:     Chest wall: No tenderness.  Abdominal:     General: There is no distension.     Palpations: Abdomen is soft. There is no mass.     Tenderness: There is no abdominal tenderness. There is no right CVA tenderness, left CVA tenderness, guarding or rebound.     Hernia: No hernia is present.  Musculoskeletal:     Right lower leg: No edema.     Left lower leg: No edema.  Skin:    General: Skin is warm.     Findings: No rash.  Neurological:     Mental Status: She is alert.  Psychiatric:        Behavior: Behavior normal.     ED Course/Procedures     Procedures  MDM   48 year old female received a signout from [facility] pending labs.  In brief, the patient was recently admitted for acute congestive heart failure and left the hospital AMA last night.  She has no complaints at this time, including leg swelling, chest pain, or shortness of breath.  She is here because she needs a note to be medically cleared to return to work.  She has been hemodynamically stable since arrival to the ER.  There was one incidental oxygen saturation of 74%, likely an error, as patient  has been observed for over 7 hours in the ER, without hypoxia or supplemental oxygen.  Chest x-ray is clear and markedly improved from before.  BNP is 244, significantly improved from previous.  No metabolic derangements.  Initial CBC was concerning for acute pancytopenia over the last 24 to 48 hours.  However, after speaking with nursing staff, there was concern that the specimen may have hemolyzed and repeat CBC was collected, which was minimally changed from previous.  At this time, the patient is hemodynamically stable and in no acute distress.  She has good follow-up with cardiology outpatient.  She has been advised to continue all of her home medications.  ER return precautions given.  Safe for discharge to home with  outpatient follow-up as indicated.     Joanne Gavel, PA-C 04/08/19 0050    Quintella Reichert, MD 04/09/19 1122

## 2019-04-07 NOTE — Progress Notes (Signed)
On 04/05/19 at 2113, was notified that NT, Eli, entered patients room to find that patient was neither in her room nor her bathroom. Her hospital gown was lying on the bed in the room. Patient had left the hospital with her peripheral IV intact and in place. Charge nurse, Marcene Brawn, was notified and she, in turn, notified both the [facility] and [facility]. On call MD, Dr Jannifer Franklin, was notified. The [facility] was able to reach the patient at her home but the patient refused to return to the hospital either for treatment or to have IV removed.

## 2019-04-07 NOTE — ED Notes (Signed)
Patient transported to X-ray 

## 2019-04-08 LAB — CBC
HCT: 37.3 % (ref 36.0–46.0)
Hemoglobin: 12.1 g/dL (ref 12.0–15.0)
MCH: 29.9 pg (ref 26.0–34.0)
MCHC: 32.4 g/dL (ref 30.0–36.0)
MCV: 92.1 fL (ref 80.0–100.0)
Platelets: 228 10*3/uL (ref 150–400)
RBC: 4.05 MIL/uL (ref 3.87–5.11)
RDW: 15 % (ref 11.5–15.5)
WBC: 5.9 10*3/uL (ref 4.0–10.5)
nRBC: 0.3 % — ABNORMAL HIGH (ref 0.0–0.2)

## 2019-04-08 LAB — POC OCCULT BLOOD, ED: Fecal Occult Bld: NEGATIVE

## 2019-04-08 NOTE — Discharge Instructions (Addendum)
Thank you for allowing me to care for you today in the Emergency Department.   Continue to take your home medications as prescribed.  Follow-up with cardiology as directed.  Return to the emergency department if you develop respiratory distress, chest pain, significant swelling in your legs, or other new, concerning symptoms.

## 2019-04-10 LAB — CULTURE, BLOOD (ROUTINE X 2)
Culture: NO GROWTH
Culture: NO GROWTH
Special Requests: ADEQUATE
Special Requests: ADEQUATE

## 2019-04-20 ENCOUNTER — Ambulatory Visit: Payer: BC Managed Care – PPO | Admitting: Family

## 2019-04-22 ENCOUNTER — Other Ambulatory Visit
Admission: RE | Admit: 2019-04-22 | Discharge: 2019-04-22 | Disposition: A | Discharge status: Home / Self Care | Payer: BC Managed Care – PPO | Source: Ambulatory Visit | Attending: Physician Assistant | Admitting: Physician Assistant

## 2019-04-22 ENCOUNTER — Other Ambulatory Visit: Payer: Self-pay

## 2019-04-22 DIAGNOSIS — Z20828 Contact with and (suspected) exposure to other viral communicable diseases: Secondary | ICD-10-CM | POA: Insufficient documentation

## 2019-04-22 DIAGNOSIS — Z01812 Encounter for preprocedural laboratory examination: Secondary | ICD-10-CM | POA: Diagnosis not present

## 2019-04-22 LAB — SARS CORONAVIRUS 2 (TAT 6-24 HRS): SARS Coronavirus 2: NEGATIVE

## 2019-04-26 ENCOUNTER — Ambulatory Visit
Admission: RE | Admit: 2019-04-26 | Discharge: 2019-04-26 | Disposition: A | Discharge status: Home / Self Care | Payer: BC Managed Care – PPO | Attending: Cardiology | Admitting: Cardiology

## 2019-04-26 ENCOUNTER — Other Ambulatory Visit: Payer: Self-pay

## 2019-04-26 ENCOUNTER — Encounter: Payer: Self-pay | Admitting: *Deleted

## 2019-04-26 ENCOUNTER — Encounter
Admission: RE | Disposition: A | Discharge status: Home / Self Care | Payer: Self-pay | Source: Home / Self Care | Attending: Cardiology

## 2019-04-26 DIAGNOSIS — E785 Hyperlipidemia, unspecified: Secondary | ICD-10-CM | POA: Insufficient documentation

## 2019-04-26 DIAGNOSIS — Z7984 Long term (current) use of oral hypoglycemic drugs: Secondary | ICD-10-CM | POA: Insufficient documentation

## 2019-04-26 DIAGNOSIS — I5021 Acute systolic (congestive) heart failure: Secondary | ICD-10-CM | POA: Diagnosis not present

## 2019-04-26 DIAGNOSIS — Z7982 Long term (current) use of aspirin: Secondary | ICD-10-CM | POA: Diagnosis not present

## 2019-04-26 DIAGNOSIS — I2510 Atherosclerotic heart disease of native coronary artery without angina pectoris: Secondary | ICD-10-CM | POA: Insufficient documentation

## 2019-04-26 DIAGNOSIS — Z79899 Other long term (current) drug therapy: Secondary | ICD-10-CM | POA: Insufficient documentation

## 2019-04-26 DIAGNOSIS — I110 Hypertensive heart disease with heart failure: Secondary | ICD-10-CM | POA: Insufficient documentation

## 2019-04-26 DIAGNOSIS — E119 Type 2 diabetes mellitus without complications: Secondary | ICD-10-CM | POA: Diagnosis not present

## 2019-04-26 DIAGNOSIS — I429 Cardiomyopathy, unspecified: Secondary | ICD-10-CM | POA: Insufficient documentation

## 2019-04-26 DIAGNOSIS — F17210 Nicotine dependence, cigarettes, uncomplicated: Secondary | ICD-10-CM | POA: Insufficient documentation

## 2019-04-26 DIAGNOSIS — M1990 Unspecified osteoarthritis, unspecified site: Secondary | ICD-10-CM | POA: Insufficient documentation

## 2019-04-26 DIAGNOSIS — R079 Chest pain, unspecified: Secondary | ICD-10-CM | POA: Diagnosis not present

## 2019-04-26 DIAGNOSIS — E7800 Pure hypercholesterolemia, unspecified: Secondary | ICD-10-CM | POA: Diagnosis not present

## 2019-04-26 HISTORY — PX: LEFT HEART CATH AND CORONARY ANGIOGRAPHY: CATH118249

## 2019-04-26 LAB — GLUCOSE, CAPILLARY
Glucose-Capillary: 104 mg/dL — ABNORMAL HIGH (ref 70–99)
Glucose-Capillary: 92 mg/dL (ref 70–99)

## 2019-04-26 SURGERY — LEFT HEART CATH AND CORONARY ANGIOGRAPHY
Anesthesia: Moderate Sedation

## 2019-04-26 MED ORDER — ASPIRIN 81 MG PO CHEW
CHEWABLE_TABLET | ORAL | Status: AC
  Filled 2019-04-26: qty 1

## 2019-04-26 MED ORDER — SODIUM CHLORIDE 0.9 % IV SOLN: 250.0000 mL | INTRAVENOUS | Status: DC | PRN

## 2019-04-26 MED ORDER — HEPARIN SODIUM (PORCINE) 1000 UNIT/ML IJ SOLN
INTRAMUSCULAR | Status: DC | PRN
  Administered 2019-04-26: 4000 [IU] via INTRAVENOUS

## 2019-04-26 MED ORDER — VERAPAMIL HCL 2.5 MG/ML IV SOLN
INTRAVENOUS | Status: AC
  Filled 2019-04-26: qty 2

## 2019-04-26 MED ORDER — SODIUM CHLORIDE 0.9% FLUSH: 3.0000 mL | INTRAVENOUS | Status: DC | PRN

## 2019-04-26 MED ORDER — VERAPAMIL HCL 2.5 MG/ML IV SOLN
INTRAVENOUS | Status: DC | PRN
  Administered 2019-04-26: 2.5 mg via INTRA_ARTERIAL

## 2019-04-26 MED ORDER — SODIUM CHLORIDE 0.9 % IV SOLN
INTRAVENOUS | Status: DC
  Administered 2019-04-26: 07:00:00 via INTRAVENOUS

## 2019-04-26 MED ORDER — SODIUM CHLORIDE 0.9% FLUSH: 3.0000 mL | Freq: Two times a day (BID) | INTRAVENOUS | Status: DC

## 2019-04-26 MED ORDER — IOHEXOL 300 MG/ML  SOLN
INTRAMUSCULAR | Status: DC | PRN
  Administered 2019-04-26: 08:00:00 70 mL

## 2019-04-26 MED ORDER — ACETAMINOPHEN 325 MG PO TABS: 650.0000 mg | ORAL_TABLET | ORAL | Status: DC | PRN

## 2019-04-26 MED ORDER — MIDAZOLAM HCL 2 MG/2ML IJ SOLN
INTRAMUSCULAR | Status: AC
  Filled 2019-04-26: qty 2

## 2019-04-26 MED ORDER — HEPARIN (PORCINE) IN NACL 1000-0.9 UT/500ML-% IV SOLN
INTRAVENOUS | Status: DC | PRN
  Administered 2019-04-26: 500 mL

## 2019-04-26 MED ORDER — ONDANSETRON HCL 4 MG/2ML IJ SOLN: 4.0000 mg | Freq: Four times a day (QID) | INTRAMUSCULAR | Status: DC | PRN

## 2019-04-26 MED ORDER — HEPARIN SODIUM (PORCINE) 1000 UNIT/ML IJ SOLN
INTRAMUSCULAR | Status: AC
  Filled 2019-04-26: qty 1

## 2019-04-26 MED ORDER — ASPIRIN 81 MG PO CHEW
81.0000 mg | CHEWABLE_TABLET | ORAL | Status: AC
  Administered 2019-04-26: 81 mg via ORAL

## 2019-04-26 MED ORDER — LABETALOL HCL 5 MG/ML IV SOLN: 10.0000 mg | INTRAVENOUS | Status: DC | PRN

## 2019-04-26 MED ORDER — HEPARIN (PORCINE) IN NACL 1000-0.9 UT/500ML-% IV SOLN
INTRAVENOUS | Status: AC
  Filled 2019-04-26: qty 1000

## 2019-04-26 MED ORDER — SODIUM CHLORIDE 0.9 % WEIGHT BASED INFUSION: 1.0000 mL/kg/h | INTRAVENOUS | Status: DC

## 2019-04-26 MED ORDER — FENTANYL CITRATE (PF) 100 MCG/2ML IJ SOLN
INTRAMUSCULAR | Status: AC
  Filled 2019-04-26: qty 2

## 2019-04-26 MED ORDER — FENTANYL CITRATE (PF) 100 MCG/2ML IJ SOLN
INTRAMUSCULAR | Status: DC | PRN
  Administered 2019-04-26: 50 ug via INTRAVENOUS

## 2019-04-26 MED ORDER — HYDRALAZINE HCL 20 MG/ML IJ SOLN: 10.0000 mg | INTRAMUSCULAR | Status: DC | PRN

## 2019-04-26 MED ORDER — MIDAZOLAM HCL 2 MG/2ML IJ SOLN
INTRAMUSCULAR | Status: DC | PRN
  Administered 2019-04-26 (×2): 1 mg via INTRAVENOUS

## 2019-04-26 SURGICAL SUPPLY — 7 items
CATH 5F 110X4 TIG (CATHETERS) ×2 IMPLANT
CATH INFINITI 5FR ANG PIGTAIL (CATHETERS) ×2 IMPLANT
DEVICE RAD TR BAND REGULAR (VASCULAR PRODUCTS) ×2 IMPLANT
GLIDESHEATH SLEND SS 6F .021 (SHEATH) ×2 IMPLANT
KIT MANI 3VAL PERCEP (MISCELLANEOUS) ×2 IMPLANT
PACK CARDIAC CATH (CUSTOM PROCEDURE TRAY) ×2 IMPLANT
WIRE ROSEN-J .035X260CM (WIRE) ×2 IMPLANT

## 2019-04-26 NOTE — Discharge Instructions (Signed)
Coronary Angiogram Coronary angiogram with stent placement is a procedure to widen or open a narrow blood vessel of the heart (coronary artery). Arteries may become blocked by cholesterol buildup (plaques) in the lining of the wall. When a coronary artery becomes partially blocked, blood flow to that area decreases. This may lead to chest pain or a heart attack (myocardial infarction). A stent is a small piece of metal that looks like mesh or a spring. Stent placement may be done as treatment for a heart attack or right after a coronary angiogram in which a blocked artery is found. Let your health care provider know about:  Any allergies you have.  All medicines you are taking, including vitamins, herbs, eye drops, creams, and over-the-counter medicines.  Any problems you or family members have had with anesthetic medicines.  Any blood disorders you have.  Any surgeries you have had.  Any medical conditions you have.  Whether you are pregnant or may be pregnant. What are the risks? Generally, this is a safe procedure. However, problems may occur, including:  Damage to the heart or its blood vessels.  A return of blockage.  Bleeding, infection, or bruising at the insertion site.  A collection of blood under the skin (hematoma) at the insertion site.  A blood clot in another part of the body.  Kidney injury.  Allergic reaction to the dye or contrast that is used.  Bleeding into the abdomen (retroperitoneal bleeding). What happens before the procedure? Staying hydrated Follow instructions from your health care provider about hydration, which may include:  Up to 2 hours before the procedure - you may continue to drink clear liquids, such as water, clear fruit juice, black coffee, and plain tea.  Eating and drinking restrictions Follow instructions from your health care provider about eating and drinking, which may include:  8 hours before the procedure - stop eating heavy  meals or foods such as meat, fried foods, or fatty foods.  6 hours before the procedure - stop eating light meals or foods, such as toast or cereal.  2 hours before the procedure - stop drinking clear liquids. Ask your health care provider about:  Changing or stopping your regular medicines. This is especially important if you are taking diabetes medicines or blood thinners.  Taking medicines such as ibuprofen. These medicines can thin your blood. Do not take these medicines before your procedure if your health care provider instructs you not to. Generally, aspirin is recommended before a procedure of passing a small, thin tube (catheter) through a blood vessel and into the heart (cardiac catheterization). What happens during the procedure?   An IV tube will be inserted into one of your veins.  You will be given one or more of the following: ? A medicine to help you relax (sedative). ? A medicine to numb the area where the catheter will be inserted into an artery (local anesthetic).  To reduce your risk of infection: ? Your health care team will wash or sanitize their hands. ? Your skin will be washed with soap. ? Hair may be removed from the area where the catheter will be inserted.  Using a guide wire, the catheter will be inserted into an artery. The location may be in your groin, in your wrist, or in the fold of your arm (near your elbow).  A type of X-ray (fluoroscopy) will be used to help guide the catheter to the opening of the arteries in the heart.  A dye will be  injected into the catheter, and X-rays will be taken. The dye will help to show where any narrowing or blockages are located in the arteries.  A tiny wire will be guided to the blocked spot, and a balloon will be inflated to make the artery wider.  The stent will be expanded and will crush the plaques into the wall of the vessel. The stent will hold the area open and improve the blood flow. Most stents have a drug  coating to reduce the risk of the stent narrowing over time.  The artery may be made wider using a drill, laser, or other tools to remove plaques.  When the blood flow is better, the catheter will be removed. The lining of the artery will grow over the stent, which stays where it was placed. This procedure may vary among health care providers and hospitals. What happens after the procedure?  If the procedure is done through the leg, you will be kept in bed lying flat for about 6 hours. You will be instructed to not bend and not cross your legs.  The insertion site will be checked frequently.  The pulse in your foot or wrist will be checked frequently.  You may have additional blood tests, X-rays, and a test that records the electrical activity of your heart (electrocardiogram, or ECG). This information is not intended to replace advice given to you by your health care provider. Make sure you discuss any questions you have with your health care provider. Document Released: 12/14/2002 Document Revised: 09/18/2017 Document Reviewed: 01/13/2016 Elsevier Patient Education  2020 [place]  This sheet gives you information about how to care for yourself after your procedure. Your health care provider may also give you more specific instructions. If you have problems or questions, contact your health care provider. What can I expect after the procedure? After the procedure, it is common to have:  Bruising and tenderness at the catheter insertion area. Follow these instructions at home: Medicines  Take over-the-counter and prescription medicines only as told by your health care provider. Insertion site care  Follow instructions from your health care provider about how to take care of your insertion site. Make sure you: ? Wash your hands with soap and water before you change your bandage (dressing). If soap and water are not available, use hand sanitizer. ? Change  your dressing as told by your health care provider. ? Leave stitches (sutures), skin glue, or adhesive strips in place. These skin closures may need to stay in place for 2 weeks or longer. If adhesive strip edges start to loosen and curl up, you may trim the loose edges. Do not remove adhesive strips completely unless your health care provider tells you to do that.  Check your insertion site every day for signs of infection. Check for: ? Redness, swelling, or pain. ? Fluid or blood. ? Pus or a bad smell. ? Warmth.  Do not take baths, swim, or use a hot tub until your health care provider approves.  You may shower 24-48 hours after the procedure, or as directed by your health care provider. ? Remove the dressing and gently wash the site with plain soap and water. ? Pat the area dry with a clean towel. ? Do not rub the site. That could cause bleeding.  Do not apply powder or lotion to the site. Activity   For 24 hours after the procedure, or as directed by your health care provider: ?  Do not flex or bend the affected arm. ? Do not push or pull heavy objects with the affected arm. ? Do not drive yourself home from the hospital or clinic. You may drive 24 hours after the procedure unless your health care provider tells you not to. ? Do not operate machinery or power tools.  Do not lift anything that is heavier than 10 lb (4.5 kg), or the limit that you are told, until your health care provider says that it is safe.  Ask your health care provider when it is okay to: ? Return to work or school. ? Resume usual physical activities or sports. ? Resume sexual activity. General instructions  If the catheter site starts to bleed, raise your arm and put firm pressure on the site. If the bleeding does not stop, get help right away. This is a medical emergency.  If you went home on the same day as your procedure, a responsible adult should be with you for the first 24 hours after you arrive  home.  Keep all follow-up visits as told by your health care provider. This is important. Contact a health care provider if:  You have a fever.  You have redness, swelling, or yellow drainage around your insertion site. Get help right away if:  You have unusual pain at the radial site.  The catheter insertion area swells very fast.  The insertion area is bleeding, and the bleeding does not stop when you hold steady pressure on the area.  Your arm or hand becomes pale, cool, tingly, or numb. These symptoms may represent a serious problem that is an emergency. Do not wait to see if the symptoms will go away. Get medical help right away. Call your local emergency services (911 in the [place] Do not drive yourself to the hospital. Summary  After the procedure, it is common to have bruising and tenderness at the site.  Follow instructions from your health care provider about how to take care of your radial site wound. Check the wound every day for signs of infection.  Do not lift anything that is heavier than 10 lb (4.5 kg), or the limit that you are told, until your health care provider says that it is safe. This information is not intended to replace advice given to you by your health care provider. Make sure you discuss any questions you have with your health care provider. Document Released: 07/12/2010 Document Revised: 07/15/2017 Document Reviewed: 07/15/2017 Elsevier Patient Education  2020 [place].    Moderate Conscious Sedation, Adult, Care After These instructions provide you with information about caring for yourself after your procedure. Your health care provider may also give you more specific instructions. Your treatment has been planned according to current medical practices, but problems sometimes occur. Call your health care provider if you have any problems or questions after your procedure. What can I expect after the procedure? After your procedure, it is  common:  To feel sleepy for several hours.  To feel clumsy and have poor balance for several hours.  To have poor judgment for several hours.  To vomit if you eat too soon. Follow these instructions at home: For at least 24 hours after the procedure:   Do not: ? Participate in activities where you could fall or become injured. ? Drive. ? Use heavy machinery. ? Drink alcohol. ? Take sleeping pills or medicines that cause drowsiness. ? Make important decisions or sign legal documents. ? Take care of children on  your own.  Rest. Eating and drinking  Follow the diet recommended by your health care provider.  If you vomit: ? Drink water, juice, or soup when you can drink without vomiting. ? Make sure you have little or no nausea before eating solid foods. General instructions  Have a responsible adult stay with you until you are awake and alert.  Take over-the-counter and prescription medicines only as told by your health care provider.  If you smoke, do not smoke without supervision.  Keep all follow-up visits as told by your health care provider. This is important. Contact a health care provider if:  You keep feeling nauseous or you keep vomiting.  You feel light-headed.  You develop a rash.  You have a fever. Get help right away if:  You have trouble breathing. This information is not intended to replace advice given to you by your health care provider. Make sure you discuss any questions you have with your health care provider. Document Released: 03/30/2013 Document Revised: 05/22/2017 Document Reviewed: 09/29/2015 Elsevier Patient Education  2020 [employer].

## 2019-10-16 ENCOUNTER — Encounter: Payer: Self-pay | Admitting: Emergency Medicine

## 2019-10-16 ENCOUNTER — Emergency Department
Admission: EM | Admit: 2019-10-16 | Discharge: 2019-10-16 | Disposition: A | Discharge status: Home / Self Care | Payer: BC Managed Care – PPO | Attending: Emergency Medicine | Admitting: Emergency Medicine

## 2019-10-16 ENCOUNTER — Other Ambulatory Visit: Payer: Self-pay

## 2019-10-16 DIAGNOSIS — Z96641 Presence of right artificial hip joint: Secondary | ICD-10-CM | POA: Diagnosis not present

## 2019-10-16 DIAGNOSIS — E1140 Type 2 diabetes mellitus with diabetic neuropathy, unspecified: Secondary | ICD-10-CM | POA: Insufficient documentation

## 2019-10-16 DIAGNOSIS — F17210 Nicotine dependence, cigarettes, uncomplicated: Secondary | ICD-10-CM | POA: Diagnosis not present

## 2019-10-16 DIAGNOSIS — I110 Hypertensive heart disease with heart failure: Secondary | ICD-10-CM | POA: Insufficient documentation

## 2019-10-16 DIAGNOSIS — K8500 Idiopathic acute pancreatitis without necrosis or infection: Secondary | ICD-10-CM | POA: Insufficient documentation

## 2019-10-16 DIAGNOSIS — R1110 Vomiting, unspecified: Secondary | ICD-10-CM | POA: Insufficient documentation

## 2019-10-16 DIAGNOSIS — Z96642 Presence of left artificial hip joint: Secondary | ICD-10-CM | POA: Diagnosis not present

## 2019-10-16 DIAGNOSIS — R1010 Upper abdominal pain, unspecified: Secondary | ICD-10-CM | POA: Diagnosis present

## 2019-10-16 DIAGNOSIS — I509 Heart failure, unspecified: Secondary | ICD-10-CM | POA: Diagnosis not present

## 2019-10-16 LAB — CBC
HCT: 40.3 % (ref 36.0–46.0)
Hemoglobin: 13.2 g/dL (ref 12.0–15.0)
MCH: 30.1 pg (ref 26.0–34.0)
MCHC: 32.8 g/dL (ref 30.0–36.0)
MCV: 91.8 fL (ref 80.0–100.0)
Platelets: 206 10*3/uL (ref 150–400)
RBC: 4.39 MIL/uL (ref 3.87–5.11)
RDW: 14.1 % (ref 11.5–15.5)
WBC: 9.1 10*3/uL (ref 4.0–10.5)
nRBC: 0 % (ref 0.0–0.2)

## 2019-10-16 LAB — URINALYSIS, COMPLETE (UACMP) WITH MICROSCOPIC
Bacteria, UA: NONE SEEN
Glucose, UA: 250 mg/dL — AB
Hgb urine dipstick: NEGATIVE
Ketones, ur: 15 mg/dL — AB
Nitrite: NEGATIVE
Protein, ur: 100 mg/dL — AB
Specific Gravity, Urine: 1.025 (ref 1.005–1.030)
pH: 7 (ref 5.0–8.0)

## 2019-10-16 LAB — COMPREHENSIVE METABOLIC PANEL
ALT: 20 U/L (ref 0–44)
AST: 24 U/L (ref 15–41)
Albumin: 4.3 g/dL (ref 3.5–5.0)
Alkaline Phosphatase: 87 U/L (ref 38–126)
Anion gap: 12 (ref 5–15)
BUN: 11 mg/dL (ref 6–20)
CO2: 25 mmol/L (ref 22–32)
Calcium: 9.5 mg/dL (ref 8.9–10.3)
Chloride: 98 mmol/L (ref 98–111)
Creatinine, Ser: 0.66 mg/dL (ref 0.44–1.00)
GFR calc Af Amer: >60 mL/min (ref >60)
GFR calc non Af Amer: >60 mL/min (ref >60)
Glucose, Bld: 240 mg/dL — ABNORMAL HIGH (ref 70–99)
Potassium: 3.3 mmol/L — ABNORMAL LOW (ref 3.5–5.1)
Sodium: 135 mmol/L (ref 135–145)
Total Bilirubin: 0.9 mg/dL (ref 0.3–1.2)
Total Protein: 8.5 g/dL — ABNORMAL HIGH (ref 6.5–8.1)

## 2019-10-16 LAB — PREGNANCY, URINE: Preg Test, Ur: NEGATIVE

## 2019-10-16 LAB — GLUCOSE, CAPILLARY: Glucose-Capillary: 234 mg/dL — ABNORMAL HIGH (ref 70–99)

## 2019-10-16 LAB — LIPASE, BLOOD: Lipase: 583 U/L — ABNORMAL HIGH (ref 11–51)

## 2019-10-16 MED ORDER — OXYCODONE-ACETAMINOPHEN 5-325 MG PO TABS: 1.0000 | ORAL_TABLET | Freq: Three times a day (TID) | ORAL | 0 refills | Status: DC | PRN

## 2019-10-16 MED ORDER — PROMETHAZINE HCL 25 MG PO TABS: 25.0000 mg | ORAL_TABLET | ORAL | 1 refills | Status: DC | PRN

## 2019-10-16 MED ORDER — ONDANSETRON 4 MG PO TBDP
4.0000 mg | ORAL_TABLET | Freq: Once | ORAL | Status: AC
  Administered 2019-10-16: 4 mg via ORAL
  Filled 2019-10-16: qty 1

## 2019-10-16 NOTE — ED Triage Notes (Signed)
Here for upper abdominal pain that is across whole upper abdomen, not more to one side or other. Sx X 2 days. Started vomiting today. No diarrhea or fevers. Unlabored. VSS.  BP and CBG elevated but has not taken meds in 2 days.

## 2019-10-16 NOTE — ED Provider Notes (Signed)
[facility] Emergency Department Provider Note       Time seen: ----------------------------------------- 4:20 PM on 10/16/2019 -----------------------------------------   I have reviewed the triage vital signs and the nursing notes.  HISTORY   Chief Complaint Abdominal Pain    HPI April Chaney is a 49 y.o. female with a history of arthritis, diabetes, GERD, hypertension, pancreatitis who presents to the ED for upper abdominal pain that is across the whole upper abdomen, not more to one side or the other.  Symptoms of been going on for the past 2 days, she started vomiting today.  Pain is 7 out of 10 in the upper abdomen.  She has recently had alcohol but not excessively.  Past Medical History:  Diagnosis Date  . Arthritis   . Colon polyps   . Complication of anesthesia    woke up during 1 colonscopy 3 yrs ago  . Diabetes mellitus without complication ([place])    type 2  . DM (diabetes mellitus) ([place])   . Family history of adverse reaction to anesthesia    mother slow to awaken after 1 procedure  . GERD (gastroesophageal reflux disease)   . Hypertension    hx of no bp meds for last 11 years after weight loss  . Pancreatitis 3-4 yrs ago and feb 2018  . Presence of artificial hip    has to take antibiotics prior to any procedure-dental, etc  . Ulcer     Patient Active Problem List   Diagnosis Date Noted  . Acute CHF (congestive heart failure) ([place]) 04/05/2019  . Epigastric pain   . Alcohol-induced acute pancreatitis   . Abdominal pain 08/03/2016  . Encounter for health maintenance examination 10/22/2015  . Rheumatoid arthritis involving multiple sites with positive rheumatoid factor ([place]) 10/22/2015  . BMI 35.0-35.9,adult 09/23/2015  . Elevated rheumatoid factor 09/23/2015  . History of adenomatous polyp of colon 09/23/2015  . Alopecia 08/18/2015  . Benign essential hypertension 08/18/2015  . Benign neoplasm of colon 08/18/2015  .  Epigastric discomfort 08/18/2015  . GERD without esophagitis 08/18/2015  . Heartburn 08/18/2015  . High risk medication use 08/18/2015  . Hyperlipidemia 08/18/2015  . Hyponatremia 08/18/2015  . Infective otitis externa of left ear 08/18/2015  . Insomnia 08/18/2015  . Neuropathy of both feet 08/18/2015  . Nicotine dependence 08/18/2015  . Oligomenorrhea 08/18/2015  . Obesity 08/18/2015  . Gastro-esophageal reflux disease with esophagitis 08/18/2015  . Stress incontinence in female 08/18/2015  . Type 2 diabetes, uncontrolled, with neuropathy ([place]) 08/18/2015  . Uncontrolled diabetes mellitus with complications ([place]) 99991111  . Vitamin D deficiency 08/18/2015  . Wheezing 08/18/2015  . Colon polyps 01/06/2014  . Pancreatic mass 01/06/2014  . Pancreatitis 01/06/2014    Past Surgical History:  Procedure Laterality Date  . COLONOSCOPY WITH PROPOFOL N/A 02/11/2019   Procedure: COLONOSCOPY WITH PROPOFOL;  Surgeon: Lollie Sails, MD;  Location: [facility] ENDOSCOPY;  Service: Endoscopy;  Laterality: N/A;  . colonscopy     x 4  . ESOPHAGOGASTRODUODENOSCOPY (EGD) WITH PROPOFOL N/A 02/11/2019   Procedure: ESOPHAGOGASTRODUODENOSCOPY (EGD) WITH PROPOFOL;  Surgeon: Lollie Sails, MD;  Location: [facility] ENDOSCOPY;  Service: Endoscopy;  Laterality: N/A;  . EUS N/A 01/01/2017   Procedure: UPPER ENDOSCOPIC ULTRASOUND (EUS) LINEAR;  Surgeon: Milus Banister, MD;  Location: [facility] ENDOSCOPY;  Service: Endoscopy;  Laterality: N/A;  . FINE NEEDLE ASPIRATION  01/01/2017   Procedure: FINE NEEDLE ASPIRATION;  Surgeon: Milus Banister, MD;  Location: [facility] ENDOSCOPY;  Service: Endoscopy;;  .  JOINT REPLACEMENT     hip replacement  . LEFT HEART CATH AND CORONARY ANGIOGRAPHY N/A 04/26/2019   Procedure: LEFT HEART CATH AND CORONARY ANGIOGRAPHY;  Surgeon: Isaias Cowman, MD;  Location: [facility] LAB;  Service: Cardiovascular;  Laterality: N/A;  . STRABISMUS SURGERY  12/12/2011   Procedure: REPAIR  STRABISMUS;  Surgeon: Derry Skill, MD;  Location: [facility];  Service: Ophthalmology;  Laterality: Right;  . TOTAL HIP ARTHROPLASTY     bilat    Allergies Pantoprazole  Social History Social History   Tobacco Use  . Smoking status: Current Some Day Smoker    Packs/day: 0.50  . Smokeless tobacco: Never Used  Substance Use Topics  . Alcohol use: Yes    Comment: occ  . Drug use: No    Review of Systems Constitutional: Negative for fever. Cardiovascular: Negative for chest pain. Respiratory: Negative for shortness of breath. Gastrointestinal: Positive for abdominal pain, vomiting Musculoskeletal: Negative for back pain. Skin: Negative for rash. Neurological: Negative for headaches, focal weakness or numbness.  All systems negative/normal/unremarkable except as stated in the HPI  ____________________________________________   PHYSICAL EXAM:  VITAL SIGNS: ED Triage Vitals  Enc Vitals Group     BP 10/16/19 1231 (!) 170/120     Pulse Rate 10/16/19 1231 85     Resp 10/16/19 1231 18     Temp 10/16/19 1231 97.8 F (36.6 C)     Temp Source 10/16/19 1231 Oral     SpO2 10/16/19 1231 99 %     Weight 10/16/19 1230 154 lb (69.9 kg)     Height 10/16/19 1230 5' (1.524 m)     Head Circumference --      Peak Flow --      Pain Score 10/16/19 1230 7     Pain Loc --      Pain Edu? --      Excl. in [place]? --     Constitutional: Alert and oriented. Well appearing and in no distress. Eyes: Conjunctivae are normal. Normal extraocular movements. Cardiovascular: Normal rate, regular rhythm. No murmurs, rubs, or gallops. Respiratory: Normal respiratory effort without tachypnea nor retractions. Breath sounds are clear and equal bilaterally. No wheezes/rales/rhonchi. Gastrointestinal: Epigastric tenderness, no rebound or guarding.  Normal bowel sounds. Musculoskeletal: Nontender with normal range of motion in extremities. No lower extremity tenderness nor  edema. Neurologic:  Normal speech and language. No gross focal neurologic deficits are appreciated.  Skin:  Skin is warm, dry and intact. No rash noted. Psychiatric: Mood and affect are normal. Speech and behavior are normal.  ____________________________________________  ED COURSE:  As part of my medical decision making, I reviewed the following data within the [place] History obtained from family if available, nursing notes, old chart and ekg, as well as notes from prior ED visits. Patient presented for abdominal pain, we will assess with labs and imaging as indicated at this time.   Procedures  April Chaney was evaluated in Emergency Department on 10/16/2019 for the symptoms described in the history of present illness. She was evaluated in the context of the global COVID-19 pandemic, which necessitated consideration that the patient might be at risk for infection with the SARS-CoV-2 virus that causes COVID-19. Institutional protocols and algorithms that pertain to the evaluation of patients at risk for COVID-19 are in a state of rapid change based on information released by regulatory bodies including the CDC and federal and state organizations. These policies and algorithms were followed during  the patient's care in the ED.  ____________________________________________   LABS (pertinent positives/negatives)  Labs Reviewed  GLUCOSE, CAPILLARY - Abnormal; Notable for the following components:      Result Value   Glucose-Capillary 234 (*)    All other components within normal limits  LIPASE, BLOOD - Abnormal; Notable for the following components:   Lipase 583 (*)    All other components within normal limits  COMPREHENSIVE METABOLIC PANEL - Abnormal; Notable for the following components:   Potassium 3.3 (*)    Glucose, Bld 240 (*)    Total Protein 8.5 (*)    All other components within normal limits  URINALYSIS, COMPLETE (UACMP) WITH MICROSCOPIC - Abnormal;  Notable for the following components:   APPearance HAZY (*)    Glucose, UA 250 (*)    Bilirubin Urine SMALL (*)    Ketones, ur 15 (*)    Protein, ur 100 (*)    Leukocytes,Ua TRACE (*)    All other components within normal limits  CBC  PREGNANCY, URINE  ___________________________________________   DIFFERENTIAL DIAGNOSIS   Pancreatitis, GERD, peptic ulcer disease, dehydration, electrolyte abnormality  FINAL ASSESSMENT AND PLAN  Acute pancreatitis   Plan: The patient had presented for pancreatitis which is possibly alcohol related. Patient's labs revealed elevated lipase otherwise no acute process, she is diabetic.  She did not require any imaging.  At the time my evaluation she had been waiting for 6 hours to be seen and was ready to go home.  I will prescribe pain medicine and antiemetics and she will be referred to GI for close outpatient follow-up.   Laurence Aly, MD    Note: This note was generated in part or whole with voice recognition software. Voice recognition is usually quite accurate but there are transcription errors that can and very often do occur. I apologize for any typographical errors that were not detected and corrected.     Earleen Newport, MD 10/16/19 [phone]

## 2019-10-16 NOTE — ED Notes (Signed)
ED Provider at bedside. 

## 2019-10-17 MED FILL — OXYCODONE-ACETAMINOPHEN 5-3: 5-325 | 7 days supply | Qty: 20 | Fill #0

## 2019-10-17 MED FILL — PROMETHAZINE 25 MG TABLET: 25 | 5 days supply | Qty: 30 | Fill #0

## 2019-12-09 ENCOUNTER — Encounter: Payer: Self-pay | Admitting: *Deleted

## 2019-12-09 ENCOUNTER — Emergency Department
Admission: EM | Admit: 2019-12-09 | Discharge: 2019-12-10 | Payer: BC Managed Care – PPO | Attending: Emergency Medicine | Admitting: Emergency Medicine

## 2019-12-09 ENCOUNTER — Other Ambulatory Visit: Payer: Self-pay

## 2019-12-09 ENCOUNTER — Emergency Department: Payer: BC Managed Care – PPO

## 2019-12-09 DIAGNOSIS — Z20822 Contact with and (suspected) exposure to covid-19: Secondary | ICD-10-CM | POA: Insufficient documentation

## 2019-12-09 DIAGNOSIS — Z7984 Long term (current) use of oral hypoglycemic drugs: Secondary | ICD-10-CM | POA: Diagnosis not present

## 2019-12-09 DIAGNOSIS — Z79899 Other long term (current) drug therapy: Secondary | ICD-10-CM | POA: Diagnosis not present

## 2019-12-09 DIAGNOSIS — R112 Nausea with vomiting, unspecified: Secondary | ICD-10-CM | POA: Diagnosis not present

## 2019-12-09 DIAGNOSIS — A419 Sepsis, unspecified organism: Secondary | ICD-10-CM | POA: Insufficient documentation

## 2019-12-09 DIAGNOSIS — I509 Heart failure, unspecified: Secondary | ICD-10-CM | POA: Diagnosis not present

## 2019-12-09 DIAGNOSIS — I110 Hypertensive heart disease with heart failure: Secondary | ICD-10-CM | POA: Diagnosis not present

## 2019-12-09 DIAGNOSIS — R188 Other ascites: Secondary | ICD-10-CM | POA: Diagnosis not present

## 2019-12-09 DIAGNOSIS — K863 Pseudocyst of pancreas: Secondary | ICD-10-CM

## 2019-12-09 DIAGNOSIS — Z96643 Presence of artificial hip joint, bilateral: Secondary | ICD-10-CM | POA: Insufficient documentation

## 2019-12-09 DIAGNOSIS — E119 Type 2 diabetes mellitus without complications: Secondary | ICD-10-CM | POA: Insufficient documentation

## 2019-12-09 DIAGNOSIS — R1031 Right lower quadrant pain: Secondary | ICD-10-CM

## 2019-12-09 DIAGNOSIS — F17210 Nicotine dependence, cigarettes, uncomplicated: Secondary | ICD-10-CM | POA: Diagnosis not present

## 2019-12-09 LAB — CBC
HCT: 35.2 % — ABNORMAL LOW (ref 36.0–46.0)
Hemoglobin: 11.4 g/dL — ABNORMAL LOW (ref 12.0–15.0)
MCH: 27.1 pg (ref 26.0–34.0)
MCHC: 32.4 g/dL (ref 30.0–36.0)
MCV: 83.6 fL (ref 80.0–100.0)
Platelets: 487 10*3/uL — ABNORMAL HIGH (ref 150–400)
RBC: 4.21 MIL/uL (ref 3.87–5.11)
RDW: 15.1 % (ref 11.5–15.5)
WBC: 16.3 10*3/uL — ABNORMAL HIGH (ref 4.0–10.5)
nRBC: 0 % (ref 0.0–0.2)

## 2019-12-09 LAB — URINALYSIS, COMPLETE (UACMP) WITH MICROSCOPIC
Bilirubin Urine: NEGATIVE
Glucose, UA: 150 mg/dL — AB
Ketones, ur: NEGATIVE mg/dL
Nitrite: NEGATIVE
Protein, ur: 30 mg/dL — AB
Specific Gravity, Urine: 1.03 (ref 1.005–1.030)
pH: 5 (ref 5.0–8.0)

## 2019-12-09 LAB — COMPREHENSIVE METABOLIC PANEL
ALT: 12 U/L (ref 0–44)
AST: 14 U/L — ABNORMAL LOW (ref 15–41)
Albumin: 3.7 g/dL (ref 3.5–5.0)
Alkaline Phosphatase: 68 U/L (ref 38–126)
Anion gap: 13 (ref 5–15)
BUN: 14 mg/dL (ref 6–20)
CO2: 22 mmol/L (ref 22–32)
Calcium: 9.2 mg/dL (ref 8.9–10.3)
Chloride: 100 mmol/L (ref 98–111)
Creatinine, Ser: 0.7 mg/dL (ref 0.44–1.00)
GFR calc Af Amer: >60 mL/min (ref >60)
GFR calc non Af Amer: >60 mL/min (ref >60)
Glucose, Bld: 146 mg/dL — ABNORMAL HIGH (ref 70–99)
Potassium: 3.2 mmol/L — ABNORMAL LOW (ref 3.5–5.1)
Sodium: 135 mmol/L (ref 135–145)
Total Bilirubin: 0.5 mg/dL (ref 0.3–1.2)
Total Protein: 8 g/dL (ref 6.5–8.1)

## 2019-12-09 LAB — POC URINE PREG, ED: Preg Test, Ur: NEGATIVE

## 2019-12-09 LAB — LIPASE, BLOOD: Lipase: 138 U/L — ABNORMAL HIGH (ref 11–51)

## 2019-12-09 MED ORDER — MORPHINE SULFATE (PF) 4 MG/ML IV SOLN
4.0000 mg | Freq: Once | INTRAVENOUS | Status: AC
  Administered 2019-12-10: 4 mg via INTRAVENOUS
  Filled 2019-12-09: qty 1

## 2019-12-09 MED ORDER — SODIUM CHLORIDE 0.9 % IV BOLUS: 1000.0000 mL | Freq: Once | INTRAVENOUS | Status: DC

## 2019-12-09 MED ORDER — ONDANSETRON HCL 4 MG/2ML IJ SOLN
4.0000 mg | INTRAMUSCULAR | Status: AC
  Administered 2019-12-10: 4 mg via INTRAVENOUS
  Filled 2019-12-09: qty 2

## 2019-12-09 MED ORDER — IOHEXOL 300 MG/ML  SOLN
100.0000 mL | Freq: Once | INTRAMUSCULAR | Status: AC | PRN
  Administered 2019-12-09: 100 mL via INTRAVENOUS

## 2019-12-09 NOTE — ED Notes (Signed)
Pt states not having a BM in about 2 days. Pt denies nausea and vomiting. Pt is resting in room. Pt states pain started yesterday. Pt states history of pancreatitis.

## 2019-12-09 NOTE — ED Triage Notes (Signed)
Pt to ED with constant right lower quadrant abd pain that started last night. Pt was admitted for Pancreatitis recently but reports that was left sided pain. No NVD or fevers. No urinary changes.

## 2019-12-09 NOTE — ED Notes (Addendum)
See triage note, pt reports RLQ abdominal pain that started last night. Denies N/V. Fever max 101 within past few days. Denies difficulty eating or drinking.  Endoscopy performed on Wednesday last week. Abdomen tender upon palpation

## 2019-12-09 NOTE — ED Provider Notes (Signed)
[facility] Emergency Department Provider Note  ____________________________________________   First MD Initiated Contact with Patient 12/09/19 2310     (approximate)  I have reviewed the triage vital signs and the nursing notes.   HISTORY  Chief Complaint Abdominal Pain    HPI April Chaney is a 49 y.o. female with medical history as listed below which notably includes a recent (last week) admission to [facility] for pancreatitis which she says is a flareup of her chronic pancreatitis.  She presents tonight for evaluation of about 24 hours of gradual onset but gradually worsening right lower quadrant abdominal pain that is now severe, sharp, and aching.  She says that is actually better with moving around and worse with remaining still.  It is accompanied with nausea, some vomiting, and decreased appetite.  She has had biopsy performed recently on her pancreas but she denies any other abdominal surgery including appendectomy.  The upper abdominal pain that she was experiencing more than a week ago as a result the pancreatitis has improved.  She denies fever/chills, sore throat, chest pain, shortness of breath, cough.  She is currently on cefdinir for a urinary tract infection and/or possible infection of the pancreas.  However she says that since she left the hospital a week ago she has been doing much better  and claims that she essentially returned to baseline before the new symptoms started about 24 hours ago.  She even had a follow-up appointment with her PCP a few days ago that was generally reassuring.        Past Medical History:  Diagnosis Date  . Arthritis   . Colon polyps   . Complication of anesthesia    woke up during 1 colonscopy 3 yrs ago  . Diabetes mellitus without complication ([place])    type 2  . DM (diabetes mellitus) ([place])   . Family history of adverse reaction to anesthesia    mother slow to awaken after 1 procedure  . GERD  (gastroesophageal reflux disease)   . Hypertension    hx of no bp meds for last 11 years after weight loss  . Pancreatitis 3-4 yrs ago and feb 2018  . Presence of artificial hip    has to take antibiotics prior to any procedure-dental, etc  . Ulcer     Patient Active Problem List   Diagnosis Date Noted  . Acute CHF (congestive heart failure) ([place]) 04/05/2019  . Epigastric pain   . Alcohol-induced acute pancreatitis   . Abdominal pain 08/03/2016  . Encounter for health maintenance examination 10/22/2015  . Rheumatoid arthritis involving multiple sites with positive rheumatoid factor ([place]) 10/22/2015  . BMI 35.0-35.9,adult 09/23/2015  . Elevated rheumatoid factor 09/23/2015  . History of adenomatous polyp of colon 09/23/2015  . Alopecia 08/18/2015  . Benign essential hypertension 08/18/2015  . Benign neoplasm of colon 08/18/2015  . Epigastric discomfort 08/18/2015  . GERD without esophagitis 08/18/2015  . Heartburn 08/18/2015  . High risk medication use 08/18/2015  . Hyperlipidemia 08/18/2015  . Hyponatremia 08/18/2015  . Infective otitis externa of left ear 08/18/2015  . Insomnia 08/18/2015  . Neuropathy of both feet 08/18/2015  . Nicotine dependence 08/18/2015  . Oligomenorrhea 08/18/2015  . Obesity 08/18/2015  . Gastro-esophageal reflux disease with esophagitis 08/18/2015  . Stress incontinence in female 08/18/2015  . Type 2 diabetes, uncontrolled, with neuropathy ([place]) 08/18/2015  . Uncontrolled diabetes mellitus with complications ([place]) 78/29/5621  . Vitamin D deficiency 08/18/2015  . Wheezing 08/18/2015  .  Colon polyps 01/06/2014  . Pancreatic mass 01/06/2014  . Pancreatitis 01/06/2014    Past Surgical History:  Procedure Laterality Date  . COLONOSCOPY WITH PROPOFOL N/A 02/11/2019   Procedure: COLONOSCOPY WITH PROPOFOL;  Surgeon: Lollie Sails, MD;  Location: [facility] ENDOSCOPY;  Service: Endoscopy;  Laterality: N/A;  . colonscopy     x 4  .  ESOPHAGOGASTRODUODENOSCOPY (EGD) WITH PROPOFOL N/A 02/11/2019   Procedure: ESOPHAGOGASTRODUODENOSCOPY (EGD) WITH PROPOFOL;  Surgeon: Lollie Sails, MD;  Location: [facility] ENDOSCOPY;  Service: Endoscopy;  Laterality: N/A;  . EUS N/A 01/01/2017   Procedure: UPPER ENDOSCOPIC ULTRASOUND (EUS) LINEAR;  Surgeon: Milus Banister, MD;  Location: [facility] ENDOSCOPY;  Service: Endoscopy;  Laterality: N/A;  . FINE NEEDLE ASPIRATION  01/01/2017   Procedure: FINE NEEDLE ASPIRATION;  Surgeon: Milus Banister, MD;  Location: [facility] ENDOSCOPY;  Service: Endoscopy;;  . JOINT REPLACEMENT     hip replacement  . LEFT HEART CATH AND CORONARY ANGIOGRAPHY N/A 04/26/2019   Procedure: LEFT HEART CATH AND CORONARY ANGIOGRAPHY;  Surgeon: Isaias Cowman, MD;  Location: [place] CV LAB;  Service: Cardiovascular;  Laterality: N/A;  . STRABISMUS SURGERY  12/12/2011   Procedure: REPAIR STRABISMUS;  Surgeon: Derry Skill, MD;  Location: [place];  Service: Ophthalmology;  Laterality: Right;  . TOTAL HIP ARTHROPLASTY     bilat    Prior to Admission medications   Medication Sig Start Date End Date Taking? Authorizing Provider  albuterol (VENTOLIN HFA) 108 (90 Base) MCG/ACT inhaler Inhale 2 puffs into the lungs every 6 (six) hours as needed for wheezing or shortness of breath (8). Patient not taking: Reported on 04/26/2019 04/05/19   Arta Silence, MD  ARTIFICIAL TEAR OP Apply 1 drop to eye daily as needed (dry eyes).    [provider]  atorvastatin (LIPITOR) 40 MG tablet Take 40 mg by mouth daily at 12 noon. 04/14/19   [provider]  buPROPion (WELLBUTRIN XL) 300 MG 24 hr tablet Take 300 mg by mouth daily.  01/03/16   [provider]  CALCIUM CARBONATE-VITAMIN D PO Take 2 tablets by mouth daily with breakfast. 500 mg  2000 mg    [provider]  cholecalciferol (VITAMIN D) 1000 units tablet Take 2,000 Units by mouth daily.    [provider]    diphenhydramine-acetaminophen (TYLENOL PM EXTRA STRENGTH) 25-500 MG TABS tablet Take 2 tablets by mouth at bedtime as needed (sleep).    [provider]  folic acid (FOLVITE) 1 MG tablet Take 3 mg by mouth daily.    [provider]  furosemide (LASIX) 20 MG tablet Take 20 mg by mouth daily at 12 noon. 04/14/19   [provider]  hydroxychloroquine (PLAQUENIL) 200 MG tablet Take 200 mg by mouth 2 (two) times daily. 08/24/18   [provider]  ibuprofen (ADVIL,MOTRIN) 200 MG tablet Take 400 mg by mouth 3 (three) times daily as needed for headache or moderate pain.    [provider]  levonorgestrel (MIRENA) 20 MCG/24HR IUD 1 each by Intrauterine route once.     [provider]  lisinopril (ZESTRIL) 10 MG tablet Take 10 mg by mouth daily. 03/28/19   [provider]  metFORMIN (GLUCOPHAGE-XR) 500 MG 24 hr tablet Take 500 mg by mouth 2 (two) times daily.  07/24/16   [provider]  metoCLOPramide (REGLAN) 10 MG tablet Take 1 tablet (10 mg total) by mouth every 8 (eight) hours as needed for up to 5 days for  nausea. Patient not taking: Reported on 04/21/2019 11/14/18 11/19/18  Arta Silence, MD  metoprolol succinate (TOPROL-XL) 50 MG 24 hr tablet Take 50 mg by mouth daily at 12 noon.  04/04/19 04/03/20  [provider]  Multiple Vitamin (MULTIVITAMIN WITH MINERALS) TABS Take 1 tablet by mouth daily.    [provider]  omeprazole (PRILOSEC) 40 MG capsule Take 40 mg by mouth daily as needed (Heartburn).  03/07/19 03/06/20  [provider]  oxyCODONE-acetaminophen (PERCOCET) 5-325 MG tablet Take 1 tablet by mouth every 8 (eight) hours as needed. 10/16/19   Earleen Newport, MD  potassium chloride (KLOR-CON) 10 MEQ tablet Take 10 mEq by mouth daily at 12 noon. 04/14/19   [provider]  promethazine (PHENERGAN) 25 MG tablet Take 1 tablet (25 mg total) by mouth every 4 (four) hours as needed for nausea  or vomiting. 10/16/19   Earleen Newport, MD  Tofacitinib Citrate (XELJANZ) 5 MG TABS Take 5 mg by mouth 2 (two) times daily.     [provider]  Turmeric 500 MG TABS Take 500 mg by mouth daily.    [provider]  valACYclovir (VALTREX) 1000 MG tablet Take 1,000 mg by mouth daily as needed (cold sores).  09/20/18   [provider]    Allergies Pantoprazole  Family History  Problem Relation Age of Onset  . Diabetes Mother   . Diabetes Father   . Hypertension Father     Social History Social History   Tobacco Use  . Smoking status: Current Some Day Smoker    Packs/day: 0.50  . Smokeless tobacco: Never Used  Vaping Use  . Vaping Use: Never used  Substance Use Topics  . Alcohol use: Yes    Comment: occ  . Drug use: No    Review of Systems Constitutional: No fever/chills Eyes: No visual changes. ENT: No sore throat. Cardiovascular: Denies chest pain. Respiratory: Denies shortness of breath. Gastrointestinal: 24 hours of gradually worsening right lower quadrant abdominal pain, nausea, minimal vomiting, and decreased appetite. Genitourinary: Negative for dysuria. Musculoskeletal: Negative for neck pain.  Negative for back pain. Integumentary: Negative for rash. Neurological: Negative for headaches, focal weakness or numbness.   ____________________________________________   PHYSICAL EXAM:  VITAL SIGNS: ED Triage Vitals  Enc Vitals Group     BP 12/09/19 1906 (!) 155/98     Pulse Rate 12/09/19 1906 (!) 115     Resp 12/09/19 1906 16     Temp 12/09/19 1906 99.2 F (37.3 C)     Temp Source 12/09/19 1906 Oral     SpO2 12/09/19 1906 100 %     Weight 12/09/19 1908 69.9 kg (154 lb 1.6 oz)     Height 12/09/19 1908 1.524 m (5')     Head Circumference --      Peak Flow --      Pain Score 12/09/19 1907 7     Pain Loc --      Pain Edu? --      Excl. in [place]? --     Constitutional: Alert and oriented.  Eyes: Conjunctivae are normal.  Head:  Atraumatic. Nose: No congestion/rhinnorhea. Mouth/Throat: Patient is wearing a mask. Neck: No stridor.  No meningeal signs.   Cardiovascular: Normal rate, regular rhythm. Good peripheral circulation. Grossly normal heart sounds. Respiratory: Normal respiratory effort.  No retractions. Gastrointestinal: Soft and nondistended.  Tenderness to palpation of the right lower quadrant with mild rebound.  Some diffuse tenderness to palpation throughout the abdomen.  No significant tenderness of the epigastrium and negative Murphy sign. Musculoskeletal: No lower extremity tenderness nor edema. No gross deformities of extremities. Neurologic:  Normal speech and language. No gross focal neurologic deficits are appreciated.  Skin:  Skin is warm, dry and intact. Psychiatric: Mood and affect are normal. Speech and behavior are normal.  ____________________________________________   LABS (all labs ordered are listed, but only abnormal results are displayed)  Labs Reviewed  LIPASE, BLOOD - Abnormal; Notable for the following components:      Result Value   Lipase 138 (*)    All other components within normal limits  COMPREHENSIVE METABOLIC PANEL - Abnormal; Notable for the following components:   Potassium 3.2 (*)    Glucose, Bld 146 (*)    AST 14 (*)    All other components within normal limits  CBC - Abnormal; Notable for the following components:   WBC 16.3 (*)    Hemoglobin 11.4 (*)    HCT 35.2 (*)    Platelets 487 (*)    All other components within normal limits  URINALYSIS, COMPLETE (UACMP) WITH MICROSCOPIC - Abnormal; Notable for the following components:   Color, Urine AMBER (*)    APPearance HAZY (*)    Glucose, UA 150 (*)    Hgb urine dipstick MODERATE (*)    Protein, ur 30 (*)    Leukocytes,Ua SMALL (*)    Bacteria, UA RARE (*)    All other components within normal limits  AMYLASE - Abnormal; Notable for the following components:   Amylase 239 (*)    All other components within  normal limits  SARS CORONAVIRUS 2 BY RT PCR (HOSPITAL ORDER, [place] [facility])  CULTURE, BLOOD (ROUTINE X 2)  CULTURE, BLOOD (ROUTINE X 2)  PROCALCITONIN  LACTIC ACID, PLASMA  LACTIC ACID, PLASMA  POC URINE PREG, ED   ____________________________________________  EKG  No indication for emergent EKG ____________________________________________  RADIOLOGY I, Hinda Kehr, personally viewed and evaluated these images (plain radiographs) as part of my medical decision making, as well as reviewing the written report by the radiologist.  ED MD interpretation: Pancreatic pseudocysts with peripancreatic fluid collecting in the paracolic gutter.  Official radiology report(s): CT ABDOMEN PELVIS W CONTRAST  Result Date: 12/10/2019 CLINICAL DATA:  Nausea and vomiting with right lower quadrant abdominal pain. Recent hospital admission for pancreatitis. EXAM: CT ABDOMEN AND PELVIS WITH CONTRAST TECHNIQUE: Multidetector CT imaging of the abdomen and pelvis was performed using the standard protocol following bolus administration of intravenous contrast. CONTRAST:  163mL OMNIPAQUE IOHEXOL 300 MG/ML  SOLN COMPARISON:  Abdominal CT 11/14/2018 FINDINGS: Lower chest: No pleural fluid or confluent airspace disease. Hepatobiliary: Mild hepatic steatosis without focal hepatic lesion. Gallbladder physiologically distended, no calcified stone. No biliary dilatation. Pancreas: Diffuse peripancreatic fat stranding around retroperitoneal fluid, tracking into the right pericolic gutter. No ductal dilatation. Within the pancreatic body is a 9 mm cystic structure, slightly increased in size from prior exam and likely chronic pseudocyst. There is a new peripancreatic collection measuring 2.5 x 1.8 x 2.7 cm, series 2, image 36 consistent with new pseudocyst. There is adjacent ill-defined non loculated fluid also adjacent to the pancreatic tail. No evidence of pancreatic necrosis. Spleen: Normal in size  without focal abnormality. Small splenule anteriorly. Adrenals/Urinary Tract: Normal adrenal glands. No hydronephrosis or perinephric edema. Homogeneous renal enhancement with symmetric excretion on delayed phase imaging. Urinary bladder is decompressed, partially obscured by streak artifact from bilateral hip arthroplasties. Stomach/Bowel: Normal appendix, for example  series 2, image 60. No appendicitis. The stomach is nondistended. Mild wall thickening of the duodenum in the region of pancreatic inflammation is likely reactive. There is no evidence of small bowel obstruction or inflammation. Minor colonic diverticulosis without diverticulitis. Mild wall thickening at the splenic flexure of the colon is likely reactive related adjacent pancreatic inflammation. Moderate stool in the ascending colon without associated wall thickening. Vascular/Lymphatic: Normal caliber abdominal aorta. Portal and splenic veins are patent. Soft tissue density in the central mesentery is likely related to pancreatic inflammation rather than prominent lymph nodes. Reproductive: There is an IUD in the uterus, grossly normal or visualized, partially obscured by adjacent streak artifact. No adnexal mass. Other: Peripancreatic fat stranding with ill-defined fluid and edema tracking into the right greater than left pericolic gutter and retroperitoneum. Small amount of free fluid tracks in the right upper quadrant. There is no free air. Musculoskeletal: Bilateral hip arthroplasties. There are no acute or suspicious osseous abnormalities. IMPRESSION: 1. Acute edematous pancreatitis. Peripancreatic collection measuring 2.5 x 1.8 x 2.7 cm, is new from imaging last year and consistent with pseudocyst. 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 No evidence of pancreatic necrosis. 2. Normal appendix. 3. Peripancreatic fluid tracks into the right pericolic gutter and retroperitoneum, no other explanation for  right-sided pain. 4. Mild hepatic steatosis. 5. Minor colonic diverticulosis without diverticulitis. Electronically Signed   By: Keith Rake M.D.   On: 12/10/2019 00:09    ____________________________________________   PROCEDURES   Procedure(s) performed (including Critical Care):  .Critical Care Performed by: Hinda Kehr, MD Authorized by: Hinda Kehr, MD   Critical care provider statement:    Critical care time (minutes):  40   Critical care time was exclusive of:  Separately billable procedures and treating other patients   Critical care was necessary to treat or prevent imminent or life-threatening deterioration of the following conditions:  Sepsis   Critical care was time spent personally by me on the following activities:  Development of treatment plan with patient or surrogate, discussions with consultants, evaluation of patient's response to treatment, examination of patient, obtaining history from patient or surrogate, ordering and performing treatments and interventions, ordering and review of laboratory studies, ordering and review of radiographic studies, pulse oximetry, re-evaluation of patient's condition and review of old charts     ____________________________________________   [place] / MDM / [place] / ED COURSE  As part of my medical decision making, I reviewed the following data within the [place] notes reviewed and incorporated, Labs reviewed , Old chart reviewed, Discussed with Dr. Alice Reichert [facility] GI), Discussed with Dr. Prescott Parma [facility] GI), discussed with Dr. Nicoletta Dress [facility]) and reviewed Notes from prior ED visits   Differential diagnosis includes, but is not limited to, appendicitis, epiploic appendagitis, diverticulitis, complication after recent pancreas biopsies such as intra-abdominal abscess or infection, musculoskeletal pain, ovarian cyst or torsion.  The patient has mild tachycardia, a new  leukocytosis, and an elevated but decreasing lipase compared to prior.  Potassium is slightly decreased.  Platelet count is slightly elevated suggestive to me of volume depletion.  Urinalysis demonstrates hemoglobinuria and a few white cells but she is already on cefdinir.  I will send a urine culture but I do not think that her current issue is the result of a urinary tract infection or pyelonephritis.  Given her new findings as described above I will proceed with CT scan of the abdomen and pelvis  with IV contrast for further evaluation.  I ordered 1 L normal saline, morphine 4 mg IV, and Zofran 4 mg IV and will reassess after the imaging.       Clinical Course as of Dec 10 398  Sat Dec 10, 2019  0059 The CT scan is concerning for new fluid is collecting in the paracolic gutter.  This is most likely reactive from pancreas but is a new finding.  I discussed the case and all its complications by phone with the gastroenterologist on-call, Dr. Alice Reichert.  We discussed the case in detail and given the degree of complication including the pseudocyst, recent biopsy, new fluid, tachycardia, and leukocytosis, he recommended transfer back to [facility] if possible.  He felt that empiric antibiotics would be wise given the possibility that she has an infection and could worsen.  If [facility] is closed to transfers tonight, he agreed with admission to [facility] and he will see the patient once she is admitted to the hospitalist service.  I updated the patient about the plan and she agrees.  She said that her pain continues to worsen but unfortunately due to a number of critical patients in the emergency department she has not yet received her morphine.  She will get morphine 4 mg IV, Zofran 4 g IV (both as previously ordered) as well as a liter of lactated Ringer's.  I have made her a code sepsis based on her findings and have ordered empiric antibiotics of aztreonam 2 g IV, metronidazole 500 mg IV, and vancomycin 1 g IV and as per  pharmacy consults.  I will contact [facility] for transfer.   [CF]  0126 Spoke with Valetta Fuller at the [facility]  She will contact a Medicine service and they will call me back.   [CF]  0151 I discussed the case by phone with Dr. Prescott Parma with [facility].  We discussed the case in detail and he agrees with the plan for transfer and has accepted the patient.  We are now awaiting bed assignment and then the patient will be transported to [facility].   [CF]  0207 Spoke by phone with Dr. Vernon Prey who is the accepting hospitalist at [facility].  We discussed the case and she confirmed accepting the patient as a transfer.   [CF]  0207 Lactic Acid, Venous: 0.8 [CF]    Clinical Course User Index [CF] Hinda Kehr, MD     ____________________________________________  FINAL CLINICAL IMPRESSION(S) / ED DIAGNOSES  Final diagnoses:  Pancreatic pseudocyst  Sepsis, due to unspecified organism, unspecified whether acute organ dysfunction present ([place])  RLQ abdominal pain  Nausea and vomiting, intractability of vomiting not specified, unspecified vomiting type  Other ascites     MEDICATIONS GIVEN DURING THIS VISIT:  Medications  vancomycin (VANCOREADY) IVPB 1250 mg/250 mL (1,250 mg Intravenous Not Given 12/10/19 0328)  morphine 4 MG/ML injection 4 mg (4 mg Intravenous Given 12/10/19 0103)  ondansetron (ZOFRAN) injection 4 mg (4 mg Intravenous Given 12/10/19 0103)  iohexol (OMNIPAQUE) 300 MG/ML solution 100 mL (100 mLs Intravenous Contrast Given 12/09/19 2335)  lactated ringers bolus 1,000 mL (0 mLs Intravenous Stopped 12/10/19 0240)  metroNIDAZOLE (FLAGYL) IVPB 500 mg (0 mg Intravenous Stopped 12/10/19 0226)  ceFEPIme (MAXIPIME) 2 g in sodium chloride 0.9 % 100 mL IVPB (0 g Intravenous Stopped 12/10/19 0314)     ED Discharge Orders    None      *Please note:  Sitara Musto was evaluated in Emergency Department on 12/10/2019 for  the symptoms described in the history of present illness. She was  evaluated in the context of the global COVID-19 pandemic, which necessitated consideration that the patient might be at risk for infection with the SARS-CoV-2 virus that causes COVID-19. Institutional protocols and algorithms that pertain to the evaluation of patients at risk for COVID-19 are in a state of rapid change based on information released by regulatory bodies including the CDC and federal and state organizations. These policies and algorithms were followed during the patient's care in the ED.  Some ED evaluations and interventions may be delayed as a result of limited staffing during and after the pandemic.*  Note:  This document was prepared using Dragon voice recognition software and may include unintentional dictation errors.   Hinda Kehr, MD 12/10/19 [phone]

## 2019-12-10 LAB — LACTIC ACID, PLASMA: Lactic Acid, Venous: 0.8 mmol/L (ref 0.5–1.9)

## 2019-12-10 LAB — PROCALCITONIN: Procalcitonin: <0.1 ng/mL

## 2019-12-10 LAB — SARS CORONAVIRUS 2 BY RT PCR (HOSPITAL ORDER, PERFORMED IN ~~LOC~~ HOSPITAL LAB): SARS Coronavirus 2: NEGATIVE

## 2019-12-10 LAB — AMYLASE: Amylase: 239 U/L — ABNORMAL HIGH (ref 28–100)

## 2019-12-10 MED ORDER — SODIUM CHLORIDE 0.9 % IV SOLN: 2.0000 g | Freq: Once | INTRAVENOUS | Status: DC

## 2019-12-10 MED ORDER — VANCOMYCIN HCL IN DEXTROSE 1-5 GM/200ML-% IV SOLN: 1000.0000 mg | Freq: Once | INTRAVENOUS | Status: DC

## 2019-12-10 MED ORDER — METRONIDAZOLE IN NACL 5-0.79 MG/ML-% IV SOLN
500.0000 mg | Freq: Once | INTRAVENOUS | Status: AC
  Administered 2019-12-10: 500 mg via INTRAVENOUS
  Filled 2019-12-10: qty 100

## 2019-12-10 MED ORDER — SODIUM CHLORIDE 0.9 % IV SOLN
2.0000 g | Freq: Once | INTRAVENOUS | Status: AC
  Administered 2019-12-10: 2 g via INTRAVENOUS
  Filled 2019-12-10: qty 2

## 2019-12-10 MED ORDER — VANCOMYCIN HCL 1250 MG/250ML IV SOLN
1250.0000 mg | Freq: Once | INTRAVENOUS | Status: DC
  Filled 2019-12-10: qty 250

## 2019-12-10 MED ORDER — LACTATED RINGERS IV BOLUS (SEPSIS)
1000.0000 mL | Freq: Once | INTRAVENOUS | Status: AC
  Administered 2019-12-10: 1000 mL via INTRAVENOUS

## 2019-12-10 NOTE — ED Notes (Signed)
Report given to Gigi, RN

## 2019-12-10 NOTE — ED Notes (Signed)
emtala checked for completion.  

## 2019-12-10 NOTE — Progress Notes (Signed)
PHARMACY -  BRIEF ANTIBIOTIC NOTE   Pharmacy has received consult(s) for vancomycin/cefepime from an ED provider.  The patient's profile has been reviewed for ht/wt/allergies/indication/available labs.    One time order(s) placed for vancomycin 1.25g IV load and cefepime 2g IV x 1  Further antibiotics/pharmacy consults should be ordered by admitting physician if indicated.                       Thank you,  Tobie Lords, PharmD, BCPS Clinical Pharmacist 12/10/2019  1:03 AM

## 2019-12-10 NOTE — ED Notes (Signed)
Pt going to [facility] with [place]. [facility] is picking up pt now

## 2019-12-10 NOTE — ED Notes (Signed)
BLOOD CULTURES OBTAINED BEFORE FLAGYL STARTED.

## 2019-12-15 LAB — CULTURE, BLOOD (ROUTINE X 2)
Culture: NO GROWTH
Culture: NO GROWTH
Special Requests: ADEQUATE

## 2019-12-24 ENCOUNTER — Encounter: Payer: Self-pay | Admitting: Radiology

## 2019-12-24 ENCOUNTER — Emergency Department
Admission: EM | Admit: 2019-12-24 | Discharge: 2019-12-24 | Disposition: A | Discharge status: Other Acute Inpatient Hospital | Payer: BC Managed Care – PPO | Attending: Emergency Medicine | Admitting: Emergency Medicine

## 2019-12-24 ENCOUNTER — Other Ambulatory Visit: Payer: Self-pay

## 2019-12-24 ENCOUNTER — Emergency Department: Payer: BC Managed Care – PPO

## 2019-12-24 DIAGNOSIS — R1013 Epigastric pain: Secondary | ICD-10-CM | POA: Diagnosis present

## 2019-12-24 DIAGNOSIS — Z79899 Other long term (current) drug therapy: Secondary | ICD-10-CM | POA: Insufficient documentation

## 2019-12-24 DIAGNOSIS — I509 Heart failure, unspecified: Secondary | ICD-10-CM | POA: Insufficient documentation

## 2019-12-24 DIAGNOSIS — Z7984 Long term (current) use of oral hypoglycemic drugs: Secondary | ICD-10-CM | POA: Diagnosis not present

## 2019-12-24 DIAGNOSIS — E1140 Type 2 diabetes mellitus with diabetic neuropathy, unspecified: Secondary | ICD-10-CM | POA: Diagnosis not present

## 2019-12-24 DIAGNOSIS — Z96643 Presence of artificial hip joint, bilateral: Secondary | ICD-10-CM | POA: Diagnosis not present

## 2019-12-24 DIAGNOSIS — R739 Hyperglycemia, unspecified: Secondary | ICD-10-CM

## 2019-12-24 DIAGNOSIS — K219 Gastro-esophageal reflux disease without esophagitis: Secondary | ICD-10-CM | POA: Insufficient documentation

## 2019-12-24 DIAGNOSIS — E1165 Type 2 diabetes mellitus with hyperglycemia: Secondary | ICD-10-CM | POA: Diagnosis not present

## 2019-12-24 DIAGNOSIS — Z20822 Contact with and (suspected) exposure to covid-19: Secondary | ICD-10-CM | POA: Diagnosis not present

## 2019-12-24 DIAGNOSIS — I110 Hypertensive heart disease with heart failure: Secondary | ICD-10-CM | POA: Diagnosis not present

## 2019-12-24 DIAGNOSIS — K859 Acute pancreatitis without necrosis or infection, unspecified: Secondary | ICD-10-CM

## 2019-12-24 LAB — COMPREHENSIVE METABOLIC PANEL
ALT: 16 U/L (ref 0–44)
AST: 20 U/L (ref 15–41)
Albumin: 4.1 g/dL (ref 3.5–5.0)
Alkaline Phosphatase: 97 U/L (ref 38–126)
Anion gap: 14 (ref 5–15)
BUN: 23 mg/dL — ABNORMAL HIGH (ref 6–20)
CO2: 23 mmol/L (ref 22–32)
Calcium: 9.1 mg/dL (ref 8.9–10.3)
Chloride: 95 mmol/L — ABNORMAL LOW (ref 98–111)
Creatinine, Ser: 0.95 mg/dL (ref 0.44–1.00)
GFR calc Af Amer: >60 mL/min (ref >60)
GFR calc non Af Amer: >60 mL/min (ref >60)
Glucose, Bld: 333 mg/dL — ABNORMAL HIGH (ref 70–99)
Potassium: 4.4 mmol/L (ref 3.5–5.1)
Sodium: 132 mmol/L — ABNORMAL LOW (ref 135–145)
Total Bilirubin: 0.5 mg/dL (ref 0.3–1.2)
Total Protein: 8.3 g/dL — ABNORMAL HIGH (ref 6.5–8.1)

## 2019-12-24 LAB — CBC
HCT: 35.8 % — ABNORMAL LOW (ref 36.0–46.0)
Hemoglobin: 11.3 g/dL — ABNORMAL LOW (ref 12.0–15.0)
MCH: 26.5 pg (ref 26.0–34.0)
MCHC: 31.6 g/dL (ref 30.0–36.0)
MCV: 83.8 fL (ref 80.0–100.0)
Platelets: 385 10*3/uL (ref 150–400)
RBC: 4.27 MIL/uL (ref 3.87–5.11)
RDW: 15.9 % — ABNORMAL HIGH (ref 11.5–15.5)
WBC: 16.5 10*3/uL — ABNORMAL HIGH (ref 4.0–10.5)
nRBC: 0 % (ref 0.0–0.2)

## 2019-12-24 LAB — URINALYSIS, COMPLETE (UACMP) WITH MICROSCOPIC
Bacteria, UA: NONE SEEN
Bilirubin Urine: NEGATIVE
Glucose, UA: >=500 mg/dL — AB
Hgb urine dipstick: NEGATIVE
Ketones, ur: NEGATIVE mg/dL
Leukocytes,Ua: NEGATIVE
Nitrite: NEGATIVE
Protein, ur: 100 mg/dL — AB
Specific Gravity, Urine: 1.031 — ABNORMAL HIGH (ref 1.005–1.030)
pH: 5 (ref 5.0–8.0)

## 2019-12-24 LAB — LIPASE, BLOOD: Lipase: 280 U/L — ABNORMAL HIGH (ref 11–51)

## 2019-12-24 LAB — GLUCOSE, CAPILLARY
Glucose-Capillary: 214 mg/dL — ABNORMAL HIGH (ref 70–99)
Glucose-Capillary: 141 mg/dL — ABNORMAL HIGH (ref 70–99)

## 2019-12-24 LAB — SARS CORONAVIRUS 2 BY RT PCR (HOSPITAL ORDER, PERFORMED IN ~~LOC~~ HOSPITAL LAB): SARS Coronavirus 2: NEGATIVE

## 2019-12-24 LAB — PREGNANCY, URINE: Preg Test, Ur: NEGATIVE

## 2019-12-24 MED ORDER — SODIUM CHLORIDE 0.9 % IV BOLUS
500.0000 mL | Freq: Once | INTRAVENOUS | Status: AC
  Administered 2019-12-24: 500 mL via INTRAVENOUS

## 2019-12-24 MED ORDER — HYDROMORPHONE HCL 1 MG/ML IJ SOLN
0.5000 mg | INTRAMUSCULAR | Status: AC
  Administered 2019-12-24: 0.5 mg via INTRAVENOUS
  Filled 2019-12-24: qty 1

## 2019-12-24 MED ORDER — HYDROMORPHONE HCL 1 MG/ML IJ SOLN
0.5000 mg | Freq: Once | INTRAMUSCULAR | Status: AC
  Administered 2019-12-24: 0.5 mg via INTRAVENOUS
  Filled 2019-12-24: qty 1

## 2019-12-24 MED ORDER — SODIUM CHLORIDE 0.9 % IV BOLUS
1000.0000 mL | Freq: Once | INTRAVENOUS | Status: AC
  Administered 2019-12-24: 1000 mL via INTRAVENOUS

## 2019-12-24 MED ORDER — MORPHINE SULFATE (PF) 4 MG/ML IV SOLN
4.0000 mg | Freq: Once | INTRAVENOUS | Status: AC
  Administered 2019-12-24: 4 mg via INTRAVENOUS
  Filled 2019-12-24: qty 1

## 2019-12-24 MED ORDER — IOHEXOL 9 MG/ML PO SOLN
500.0000 mL | ORAL | Status: AC
  Administered 2019-12-24 (×2): 500 mL via ORAL

## 2019-12-24 MED ORDER — SODIUM CHLORIDE 0.9 % IV SOLN: Freq: Once | INTRAVENOUS | Status: AC

## 2019-12-24 MED ORDER — SODIUM CHLORIDE 0.9 % IV SOLN
1.0000 g | Freq: Three times a day (TID) | INTRAVENOUS | Status: DC
  Administered 2019-12-24 (×2): 1 g via INTRAVENOUS
  Filled 2019-12-24: qty 1

## 2019-12-24 MED ORDER — IOHEXOL 300 MG/ML  SOLN
100.0000 mL | Freq: Once | INTRAMUSCULAR | Status: AC | PRN
  Administered 2019-12-24: 100 mL via INTRAVENOUS

## 2019-12-24 MED ORDER — ONDANSETRON HCL 4 MG/2ML IJ SOLN
4.0000 mg | Freq: Once | INTRAMUSCULAR | Status: AC
  Administered 2019-12-24: 4 mg via INTRAVENOUS
  Filled 2019-12-24: qty 2

## 2019-12-24 MED ORDER — HYDROMORPHONE HCL 1 MG/ML IJ SOLN: 0.5000 mg | Freq: Once | INTRAMUSCULAR | Status: AC

## 2019-12-24 MED ORDER — HYDROMORPHONE HCL 1 MG/ML IJ SOLN
INTRAMUSCULAR | Status: AC
  Administered 2019-12-24: 0.5 mg via INTRAVENOUS
  Filled 2019-12-24: qty 1

## 2019-12-24 MED ORDER — SODIUM CHLORIDE 0.9 % IV SOLN: 1.0000 g | Freq: Once | INTRAVENOUS | Status: DC

## 2019-12-24 NOTE — ED Notes (Signed)
tv on, HOB adjusted, and lights dimmed for pt comfort

## 2019-12-24 NOTE — ED Provider Notes (Addendum)
-----------------------------------------   4:24 PM on 12/24/2019 -----------------------------------------  I received a call back from Dr. Toma Deiters with the hospitalist service at [facility].  We discussed the patient's course and clinical findings.  She agrees that the patient is appropriate for transfer to [facility] for further care.  She has accepted the patient for transfer to the hospitalist service, however there currently are no beds available.  A bed likely will possibly be available tonight or more likely tomorrow.  At this time, the patient is clinically stable and is receiving IV antibiotics, fluids, and symptomatic treatment.  Since she does not need emergent intervention, she is stable to wait here until the bed is available.  I updated the patient on the plan of care.  We will continue to monitor closely, and I will contact [facility] if the patient has any decompensation or worsening of her clinical status  ----------------------------------------- 11:20 PM on 12/24/2019 -----------------------------------------  The patient has remained clinically stable with some recurrent pain controlled well with Dilaudid and no new symptoms.  I have ordered fingersticks to monitor the patient's glucose.    ----------------------------------------- 11:30 PM on 12/24/2019 -----------------------------------------  EMS is here to transfer the patient to [facility].  She is clinically stable for transfer at this time.    Arta Silence, MD 12/24/19 2330

## 2019-12-24 NOTE — ED Provider Notes (Signed)
Discussed with [facility]. Request powershare images. [facility] transfer will page GI for evaluation and consideration for transfer.    Delman Kitten, MD 12/24/19 [phone]

## 2019-12-24 NOTE — ED Notes (Signed)
Attempted x 2 to draw blood for labs but unsuccessful.

## 2019-12-24 NOTE — ED Notes (Signed)
[facility] with pt

## 2019-12-24 NOTE — ED Triage Notes (Signed)
Patient reports abdominal pain, concerned it might be her pancreatitis.

## 2019-12-24 NOTE — ED Provider Notes (Signed)
Discussed with [facility] (Dr. Casper Harrison- hospitalist at [facility]) who advises that [facility] radiology reviewed today's CT and they do not see significant worsening and don't see need for acute intervention. Discussed with Dr. Allen Norris, he will see and evaluate and provide his recommendations here at [facility]. He does advise would still recommend transfer, possibly to [facility] or [facility] wait list. Dr. Allen Norris advises starting impenem would be reasonable.       Delman Kitten, MD 12/24/19 1435

## 2019-12-24 NOTE — ED Notes (Signed)
Called pharmacy- pharmacy to resolve merrem order for pyxis.

## 2019-12-24 NOTE — ED Provider Notes (Signed)
Today's Vitals   12/24/19 0412 12/24/19 0413 12/24/19 0800  BP: 117/84  (!) 121/91  Pulse: 78  (!) 104  Resp:   (!) 22  Temp: 97.6 F (36.4 C)    TempSrc: Oral    SpO2: 96%  97%  Weight:  64 kg   Height:  5' (1.524 m)   PainSc:  9  7    Body mass index is 27.54 kg/m.  Patient reevaluated.  She is resting, but reports recurrence increasing sharp pain in the mid abdomen epigastric region.  She is understanding we are awaiting her CT results.  Tells me that she recently discussed this has been following closely with her GI doctor now.  She is fully awake and alert, does appear in pain.  Just recently placed repeat order for  additional hydromorphone at this time.   Delman Kitten, MD 12/24/19 [phone]

## 2019-12-24 NOTE — ED Provider Notes (Signed)
Have been waiting to hear back from [facility] on transfer request for about 1 hour now.  Patient reports that her pain is presently controlled, but will notify us if pain increases.  Patient agreeable with plan to transfer to [facility], if services not available there then she would also consider going to [facility].   Delman Kitten, MD 12/24/19 1118

## 2019-12-24 NOTE — Consult Note (Signed)
PHARMACY -  BRIEF ANTIBIOTIC NOTE   Pharmacy has received consult(s) for Intra-abdominal from an ED provider.  The patient's profile has been reviewed for ht/wt/allergies/indication/available labs.    One time order(s) placed for meropenem  Further antibiotics/pharmacy consults should be ordered by admitting physician if indicated.                       Thank you, Oswald Hillock 12/24/2019  2:39 PM

## 2019-12-24 NOTE — ED Provider Notes (Signed)
[facility] Emergency Department Provider Note   ____________________________________________   First MD Initiated Contact with Patient 12/24/19 [phone]     (approximate)  I have reviewed the triage vital signs and the nursing notes.   HISTORY  Chief Complaint Abdominal Pain    HPI Anvi Nickell is a 49 y.o. female who presents to the ED from home with a chief complaint of epigastric abdominal pain.  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  States she has not had EtOH in several weeks.  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  At [facility], both pancreatic fluid aspiration and fine-needle biopsy were negative for malignancy.  Referral was placed for pancreatic surgery at [facility].  Reports epigastric abdominal pain onset yesterday.  Associated with nausea and one episode of emesis.  Denies fever, chills, cough, chest pain, shortness of breath, dysuria or diarrhea.  Denies recent travel or trauma.       Past Medical History:  Diagnosis Date  . Arthritis   . Colon polyps   . Complication of anesthesia    woke up during 1 colonscopy 3 yrs ago  . Diabetes mellitus without complication ([place])    type 2  . DM (diabetes mellitus) ([place])   . Family history of adverse reaction to anesthesia    mother slow to awaken after 1 procedure  . GERD (gastroesophageal reflux disease)   . Hypertension    hx of no bp meds for last 11 years after weight loss  . Pancreatitis 3-4 yrs ago and feb 2018  . Presence of artificial hip    has to take antibiotics prior to any procedure-dental, etc  . Ulcer     Patient Active Problem List   Diagnosis Date Noted  . Acute CHF (congestive heart failure) ([place]) 04/05/2019  . Epigastric pain   . Alcohol-induced acute pancreatitis   . Abdominal  pain 08/03/2016  . Encounter for health maintenance examination 10/22/2015  . Rheumatoid arthritis involving multiple sites with positive rheumatoid factor ([place]) 10/22/2015  . BMI 35.0-35.9,adult 09/23/2015  . Elevated rheumatoid factor 09/23/2015  . History of adenomatous polyp of colon 09/23/2015  . Alopecia 08/18/2015  . Benign essential hypertension 08/18/2015  . Benign neoplasm of colon 08/18/2015  . Epigastric discomfort 08/18/2015  . GERD without esophagitis 08/18/2015  . Heartburn 08/18/2015  . High risk medication use 08/18/2015  . Hyperlipidemia 08/18/2015  . Hyponatremia 08/18/2015  . Infective otitis externa of left ear 08/18/2015  . Insomnia 08/18/2015  . Neuropathy of both feet 08/18/2015  . Nicotine dependence 08/18/2015  . Oligomenorrhea 08/18/2015  . Obesity 08/18/2015  . Gastro-esophageal reflux disease with esophagitis 08/18/2015  . Stress incontinence in female 08/18/2015  . Type 2 diabetes, uncontrolled, with neuropathy ([place]) 08/18/2015  . Uncontrolled diabetes mellitus with complications ([place]) 02/03/4817  . Vitamin D deficiency 08/18/2015  . Wheezing 08/18/2015  . Colon polyps 01/06/2014  . Pancreatic mass 01/06/2014  . Pancreatitis 01/06/2014    Past Surgical History:  Procedure Laterality Date  . COLONOSCOPY WITH PROPOFOL N/A 02/11/2019   Procedure: COLONOSCOPY WITH PROPOFOL;  Surgeon: Lollie Sails, MD;  Location: [facility] ENDOSCOPY;  Service: Endoscopy;  Laterality: N/A;  . colonscopy     x 4  . ESOPHAGOGASTRODUODENOSCOPY (EGD) WITH PROPOFOL N/A 02/11/2019   Procedure: ESOPHAGOGASTRODUODENOSCOPY (EGD) WITH PROPOFOL;  Surgeon: Lollie Sails, MD;  Location: [facility] ENDOSCOPY;  Service: Endoscopy;  Laterality: N/A;  . EUS N/A 01/01/2017   Procedure: UPPER ENDOSCOPIC ULTRASOUND (EUS) LINEAR;  Surgeon: Milus Banister, MD;  Location: [facility] ENDOSCOPY;  Service: Endoscopy;  Laterality: N/A;  . FINE NEEDLE ASPIRATION  01/01/2017   Procedure: FINE NEEDLE  ASPIRATION;  Surgeon: Milus Banister, MD;  Location: [facility] ENDOSCOPY;  Service: Endoscopy;;  . JOINT REPLACEMENT     hip replacement  . LEFT HEART CATH AND CORONARY ANGIOGRAPHY N/A 04/26/2019   Procedure: LEFT HEART CATH AND CORONARY ANGIOGRAPHY;  Surgeon: Isaias Cowman, MD;  Location: [facility] CV [facility];  Service: Cardiovascular;  Laterality: N/A;  . STRABISMUS SURGERY  12/12/2011   Procedure: REPAIR STRABISMUS;  Surgeon: Derry Skill, MD;  Location: [facility];  Service: Ophthalmology;  Laterality: Right;  . TOTAL HIP ARTHROPLASTY     bilat    Prior to Admission medications   Medication Sig Start Date End Date Taking? Authorizing Provider  albuterol (VENTOLIN HFA) 108 (90 Base) MCG/ACT inhaler Inhale 2 puffs into the lungs every 6 (six) hours as needed for wheezing or shortness of breath (8). Patient not taking: Reported on 04/26/2019 04/05/19   Arta Silence, MD  ARTIFICIAL TEAR OP Apply 1 drop to eye daily as needed (dry eyes).    [provider]  atorvastatin (LIPITOR) 40 MG tablet Take 40 mg by mouth daily at 12 noon. 04/14/19   [provider]  buPROPion (WELLBUTRIN XL) 300 MG 24 hr tablet Take 300 mg by mouth daily.  01/03/16   [provider]  CALCIUM CARBONATE-VITAMIN D PO Take 2 tablets by mouth daily with breakfast. 500 mg  2000 mg    [provider]  cholecalciferol (VITAMIN D) 1000 units tablet Take 2,000 Units by mouth daily.    [provider]  diphenhydramine-acetaminophen (TYLENOL PM EXTRA STRENGTH) 25-500 MG TABS tablet Take 2 tablets by mouth at bedtime as needed (sleep).    [provider]  folic acid (FOLVITE) 1 MG tablet Take 3 mg by mouth daily.    [provider]  furosemide (LASIX) 20 MG tablet Take 20 mg by mouth daily at 12 noon. 04/14/19   [provider]  hydroxychloroquine (PLAQUENIL) 200 MG tablet Take 200 mg by mouth 2 (two) times daily. 08/24/18   [provider]  ibuprofen (ADVIL,MOTRIN) 200 MG tablet Take 400 mg by mouth 3 (three) times daily as needed for headache or moderate pain.    [provider]  levonorgestrel (MIRENA) 20 MCG/24HR IUD 1 each by Intrauterine route once.     [provider]  lisinopril (ZESTRIL) 10 MG tablet Take 10 mg by mouth daily. 03/28/19   [provider]  metFORMIN (GLUCOPHAGE-XR) 500 MG 24 hr tablet Take 500 mg by mouth 2 (two) times daily.  07/24/16   [provider]  metoCLOPramide (REGLAN) 10 MG tablet Take 1 tablet (10 mg total) by mouth every 8 (eight) hours as needed for up to 5 days for nausea. Patient not taking: Reported on 04/21/2019 11/14/18 11/19/18  Arta Silence, MD  metoprolol succinate (TOPROL-XL) 50 MG 24 hr tablet Take 50 mg by mouth daily at 12 noon.  04/04/19 04/03/20  [provider]  Multiple Vitamin (MULTIVITAMIN WITH MINERALS) TABS Take 1 tablet by mouth daily.    [provider]  omeprazole (PRILOSEC) 40 MG capsule Take 40 mg by mouth daily as needed (Heartburn).  03/07/19 03/06/20  [provider]  oxyCODONE-acetaminophen ([place]) 5-325  MG tablet Take 1 tablet by mouth every 8 (eight) hours as needed. 10/16/19   Earleen Newport, MD  potassium chloride (KLOR-CON) 10 MEQ tablet Take 10 mEq by mouth daily at 12 noon. 04/14/19   [provider]  promethazine (PHENERGAN) 25 MG tablet Take 1 tablet (25 mg total) by mouth every 4 (four) hours as needed for nausea or vomiting. 10/16/19   Earleen Newport, MD  Tofacitinib Citrate (XELJANZ) 5 MG TABS Take 5 mg by mouth 2 (two) times daily.     [provider]  Turmeric 500 MG TABS Take 500 mg by mouth daily.    [provider]  valACYclovir (VALTREX) 1000 MG tablet Take 1,000 mg by mouth daily as needed (cold sores).  09/20/18   [provider]    Allergies Pantoprazole  Family History  Problem Relation Age of Onset  . Diabetes Mother    . Diabetes Father   . Hypertension Father     Social History Social History   Tobacco Use  . Smoking status: Current Some Day Smoker    Packs/day: 0.50  . Smokeless tobacco: Never Used  Vaping Use  . Vaping Use: Never used  Substance Use Topics  . Alcohol use: Yes    Comment: occ  . Drug use: No    Review of Systems  Constitutional: No fever/chills Eyes: No visual changes. ENT: No sore throat. Cardiovascular: Denies chest pain. Respiratory: Denies shortness of breath. Gastrointestinal: Positive for abdominal pain, nausea and vomiting.  No diarrhea.  No constipation. Genitourinary: Negative for dysuria. Musculoskeletal: Negative for back pain. Skin: Negative for rash. Neurological: Negative for headaches, focal weakness or numbness.   ____________________________________________   PHYSICAL EXAM:  VITAL SIGNS: ED Triage Vitals  Enc Vitals Group     BP 12/24/19 0412 117/84     Pulse Rate 12/24/19 0412 78     Resp --      Temp 12/24/19 0412 97.6 F (36.4 C)     Temp Source 12/24/19 0412 Oral     SpO2 12/24/19 0412 96 %     Weight 12/24/19 0413 141 lb (64 kg)     Height 12/24/19 0413 5' (1.524 m)     Head Circumference --      Peak Flow --      Pain Score 12/24/19 0413 9     Pain Loc --      Pain Edu? --      Excl. in [place]? --     Constitutional: Alert and oriented. Well appearing and in mild acute distress. Eyes: Conjunctivae are normal. PERRL. EOMI. Head: Atraumatic. Nose: No congestion/rhinnorhea. Mouth/Throat: Mucous membranes are moist.   Neck: No stridor.   Cardiovascular: Normal rate, regular rhythm. Grossly normal heart sounds.  Good peripheral circulation. Respiratory: Normal respiratory effort.  No retractions. Lungs CTAB. Gastrointestinal: Soft and mildly tender to palpation epigastrium without rebound or guarding. No distention. No abdominal bruits. No CVA tenderness. Musculoskeletal: No lower extremity tenderness nor edema.  No joint  effusions. Neurologic:  Normal speech and language. No gross focal neurologic deficits are appreciated.  Skin:  Skin is warm, dry and intact. No rash noted. Psychiatric: Mood and affect are normal. Speech and behavior are normal.  ____________________________________________   LABS (all labs ordered are listed, but only abnormal results are displayed)  Labs Reviewed  LIPASE, BLOOD - Abnormal; Notable for the following components:      Result Value   Lipase 280 (*)    All other  components within normal limits  COMPREHENSIVE METABOLIC PANEL - Abnormal; Notable for the following components:   Sodium 132 (*)    Chloride 95 (*)    Glucose, Bld 333 (*)    BUN 23 (*)    Total Protein 8.3 (*)    All other components within normal limits  CBC - Abnormal; Notable for the following components:   WBC 16.5 (*)    Hemoglobin 11.3 (*)    HCT 35.8 (*)    RDW 15.9 (*)    All other components within normal limits  URINALYSIS, COMPLETE (UACMP) WITH MICROSCOPIC - Abnormal; Notable for the following components:   Color, Urine AMBER (*)    APPearance CLOUDY (*)    Specific Gravity, Urine 1.031 (*)    Glucose, UA >=500 (*)    Protein, ur 100 (*)    All other components within normal limits  SARS CORONAVIRUS 2 BY RT PCR (HOSPITAL ORDER, [facility])  PREGNANCY, URINE   ____________________________________________  EKG  ED ECG REPORT I, Breaunna Gottlieb J, the attending physician, personally viewed and interpreted this ECG.   Date: 12/24/2019  EKG Time: 0614  Rate: 108  Rhythm: sinus tachycardia  Axis: Normal  Intervals: QTC 494  ST&T Change: Nonspecific  ____________________________________________  RADIOLOGY  ED MD interpretation: CT pending  Official radiology report(s): No results found.  ____________________________________________   PROCEDURES  Procedure(s) performed (including Critical  Care):  Procedures   ____________________________________________   INITIAL IMPRESSION / ASSESSMENT AND PLAN / ED COURSE  As part of my medical decision making, I reviewed the following data within the [facility] notes reviewed and incorporated, Labs reviewed, EKG interpreted, Old chart reviewed, Radiograph reviewed and Notes from prior ED visits     Loreda Aydelott was evaluated in Emergency Department on 12/24/2019 for the symptoms described in the history of present illness. She was evaluated in the context of the global COVID-19 pandemic, which necessitated consideration that the patient might be at risk for infection with the SARS-CoV-2 virus that causes COVID-19. Institutional protocols and algorithms that pertain to the evaluation of patients at risk for COVID-19 are in a state of rapid change based on information released by regulatory bodies including the CDC and federal and state organizations. These policies and algorithms were followed during the patient's care in the ED.    49 year old female presenting with epigastric pain. Differential diagnosis includes, but is not limited to, biliary disease (biliary colic, acute cholecystitis, cholangitis, choledocholithiasis, etc), intrathoracic causes for epigastric abdominal pain including ACS, gastritis, duodenitis, pancreatitis, small bowel or large bowel obstruction, abdominal aortic aneurysm, hernia, and ulcer(s).  Will obtain lab work to include LFTs and lipase.  Consider CT scan if lipase acutely elevated.   Clinical Course as of Dec 23 653  Sat Dec 24, 2019  7893 Noted lipase elevated from 2 weeks ago.  Given patient's complex pancreatic history with pseudocyst, will obtain CT abdomen/pelvis to evaluate any change with her pseudocyst.  IV Dilaudid given for persistent pain.  Also noted hyperglycemia without elevation of anion gap.  Patient is receiving IV fluid resuscitation.   [JS]  0654 Pain better  after IV Dilaudid.  Patient drinking oral contrast in preparation for CT scan.  Care will be transferred to the oncoming provider at shift change.  Disposition per results of CT scan.   [JS]    Clinical Course User Index [JS] Paulette Blanch, MD     ____________________________________________   FINAL CLINICAL  IMPRESSION(S) / ED DIAGNOSES  Final diagnoses:  Epigastric pain  Acute pancreatitis, unspecified complication status, unspecified pancreatitis type  Hyperglycemia     ED Discharge Orders    None       Note:  This document was prepared using Dragon voice recognition software and may include unintentional dictation errors.   Paulette Blanch, MD 12/24/19 [phone]

## 2019-12-24 NOTE — ED Notes (Signed)
Patient transported to CT 

## 2019-12-24 NOTE — ED Provider Notes (Signed)
Discussed with [facility] hospitalist Dr. Hyacinth Meeker.  Currently there is a shortage of beds at [facility], he is requested that today CT imaging be power shared and the radiologist to interpret and make comparison.  Advises unclear if the patient would benefit from transfer at this time, would like to allow [facility] to make further comparison to previous imaging studies.  Have requested power share.  [facility] anticipates they will call back in about 1 hour with further info after imaging reviewed   Delman Kitten, MD 12/24/19 1156

## 2019-12-24 NOTE — ED Provider Notes (Signed)
Ongoing care has been assigned to Dr. Cherylann Banas.  The patient has been evaluated for concerns of ongoing pancreatic inflammation and pseudocyst, with some concern by radiologist and Dr. Allen Norris (via phone, pending consult) for possible infected pseudocyst.  [facility] unable to accommodate, [facility] currently with high capacity situation and did not feel that there would become a bed available time soon, and currently awaiting return from GI having power shared CT images to [facility] to request evaluation and possible transfer.  Current plan is to attempt transfer to tertiary center with capabilities to care for the patient's potentially infected pseudocyst.  Continue meropenem.  Pain control.  Transfer pending, possibly to [facility].  However, consideration to possible other tertiary center is made.  Patient did express previously that she would also be potentially interested in going to [facility] if unable to go to [facility].  Updated the patient on plans as above, and she is understanding.  Continue to manage pain, hydration, etc.   Delman Kitten, MD 12/24/19 1556

## 2019-12-24 NOTE — ED Notes (Signed)
Pt given ice chips per her request

## 2019-12-24 NOTE — ED Provider Notes (Signed)
Transfer to [facility] discussed with Dr. Tarri Glenn ([facility]), [facility] physician that does management pancreatic cysts is currently out for a couple of weeks. [facility] does NOT have appropriate services needed at present.    Delman Kitten, MD 12/24/19 1028

## 2019-12-29 LAB — CULTURE, BLOOD (ROUTINE X 2)
Culture: NO GROWTH
Culture: NO GROWTH
Special Requests: ADEQUATE
Special Requests: ADEQUATE

## 2020-01-05 ENCOUNTER — Other Ambulatory Visit: Payer: Self-pay | Admitting: Physician Assistant

## 2020-01-05 DIAGNOSIS — Z1231 Encounter for screening mammogram for malignant neoplasm of breast: Secondary | ICD-10-CM

## 2020-12-21 ENCOUNTER — Other Ambulatory Visit: Payer: Self-pay

## 2020-12-21 ENCOUNTER — Emergency Department: Payer: BC Managed Care – PPO

## 2020-12-21 ENCOUNTER — Emergency Department
Admission: EM | Admit: 2020-12-21 | Discharge: 2020-12-21 | Disposition: A | Discharge status: Home / Self Care | Payer: BC Managed Care – PPO | Attending: Emergency Medicine | Admitting: Emergency Medicine

## 2020-12-21 ENCOUNTER — Encounter: Payer: Self-pay | Admitting: Emergency Medicine

## 2020-12-21 DIAGNOSIS — Z79899 Other long term (current) drug therapy: Secondary | ICD-10-CM | POA: Insufficient documentation

## 2020-12-21 DIAGNOSIS — R000 Tachycardia, unspecified: Secondary | ICD-10-CM | POA: Diagnosis not present

## 2020-12-21 DIAGNOSIS — I110 Hypertensive heart disease with heart failure: Secondary | ICD-10-CM | POA: Diagnosis not present

## 2020-12-21 DIAGNOSIS — R110 Nausea: Secondary | ICD-10-CM | POA: Insufficient documentation

## 2020-12-21 DIAGNOSIS — E119 Type 2 diabetes mellitus without complications: Secondary | ICD-10-CM | POA: Diagnosis not present

## 2020-12-21 DIAGNOSIS — Z7984 Long term (current) use of oral hypoglycemic drugs: Secondary | ICD-10-CM | POA: Diagnosis not present

## 2020-12-21 DIAGNOSIS — K219 Gastro-esophageal reflux disease without esophagitis: Secondary | ICD-10-CM | POA: Insufficient documentation

## 2020-12-21 DIAGNOSIS — F17210 Nicotine dependence, cigarettes, uncomplicated: Secondary | ICD-10-CM | POA: Insufficient documentation

## 2020-12-21 DIAGNOSIS — I509 Heart failure, unspecified: Secondary | ICD-10-CM | POA: Diagnosis not present

## 2020-12-21 DIAGNOSIS — R1012 Left upper quadrant pain: Secondary | ICD-10-CM | POA: Insufficient documentation

## 2020-12-21 DIAGNOSIS — Z96643 Presence of artificial hip joint, bilateral: Secondary | ICD-10-CM | POA: Insufficient documentation

## 2020-12-21 DIAGNOSIS — S2242XA Multiple fractures of ribs, left side, initial encounter for closed fracture: Secondary | ICD-10-CM

## 2020-12-21 LAB — POC URINE PREG, ED: Preg Test, Ur: NEGATIVE

## 2020-12-21 LAB — URINALYSIS, COMPLETE (UACMP) WITH MICROSCOPIC
Bacteria, UA: NONE SEEN
Bilirubin Urine: NEGATIVE
Glucose, UA: 50 mg/dL — AB
Hgb urine dipstick: NEGATIVE
Ketones, ur: NEGATIVE mg/dL
Leukocytes,Ua: NEGATIVE
Nitrite: NEGATIVE
Protein, ur: NEGATIVE mg/dL
Specific Gravity, Urine: 1.009 (ref 1.005–1.030)
pH: 5 (ref 5.0–8.0)

## 2020-12-21 LAB — COMPREHENSIVE METABOLIC PANEL
ALT: 23 U/L (ref 0–44)
AST: 32 U/L (ref 15–41)
Albumin: 4.5 g/dL (ref 3.5–5.0)
Alkaline Phosphatase: 71 U/L (ref 38–126)
Anion gap: 10 (ref 5–15)
BUN: 27 mg/dL — ABNORMAL HIGH (ref 6–20)
CO2: 22 mmol/L (ref 22–32)
Calcium: 9.4 mg/dL (ref 8.9–10.3)
Chloride: 106 mmol/L (ref 98–111)
Creatinine, Ser: 1.06 mg/dL — ABNORMAL HIGH (ref 0.44–1.00)
GFR, Estimated: >60 mL/min (ref >60)
Glucose, Bld: 320 mg/dL — ABNORMAL HIGH (ref 70–99)
Potassium: 4.1 mmol/L (ref 3.5–5.1)
Sodium: 138 mmol/L (ref 135–145)
Total Bilirubin: 0.8 mg/dL (ref 0.3–1.2)
Total Protein: 8 g/dL (ref 6.5–8.1)

## 2020-12-21 LAB — CBC
HCT: 43.5 % (ref 36.0–46.0)
Hemoglobin: 14.5 g/dL (ref 12.0–15.0)
MCH: 30.9 pg (ref 26.0–34.0)
MCHC: 33.3 g/dL (ref 30.0–36.0)
MCV: 92.8 fL (ref 80.0–100.0)
Platelets: 233 10*3/uL (ref 150–400)
RBC: 4.69 MIL/uL (ref 3.87–5.11)
RDW: 14 % (ref 11.5–15.5)
WBC: 9.9 10*3/uL (ref 4.0–10.5)
nRBC: 0 % (ref 0.0–0.2)

## 2020-12-21 LAB — D-DIMER, QUANTITATIVE: D-Dimer, Quant: 0.72 ug/mL-FEU — ABNORMAL HIGH (ref 0.00–0.50)

## 2020-12-21 LAB — TROPONIN I (HIGH SENSITIVITY)
Troponin I (High Sensitivity): 8 ng/L (ref <18)
Troponin I (High Sensitivity): 10 ng/L (ref <18)

## 2020-12-21 LAB — LIPASE, BLOOD: Lipase: 43 U/L (ref 11–51)

## 2020-12-21 MED ORDER — IOHEXOL 350 MG/ML SOLN
100.0000 mL | Freq: Once | INTRAVENOUS | Status: AC | PRN
  Administered 2020-12-21: 100 mL via INTRAVENOUS

## 2020-12-21 MED ORDER — HYDROCODONE-ACETAMINOPHEN 5-325 MG PO TABS: 2.0000 | ORAL_TABLET | Freq: Four times a day (QID) | ORAL | 0 refills | Status: DC | PRN

## 2020-12-21 MED ORDER — KETOROLAC TROMETHAMINE 30 MG/ML IJ SOLN
30.0000 mg | Freq: Once | INTRAMUSCULAR | Status: AC
  Administered 2020-12-21: 30 mg via INTRAVENOUS
  Filled 2020-12-21: qty 1

## 2020-12-21 MED ORDER — HYDROCODONE-ACETAMINOPHEN 5-325 MG PO TABS
1.0000 | ORAL_TABLET | Freq: Once | ORAL | Status: AC
  Administered 2020-12-21: 1 via ORAL
  Filled 2020-12-21: qty 1

## 2020-12-21 NOTE — ED Provider Notes (Signed)
[facility] Emergency Department Provider Note  ____________________________________________   Event Date/Time   First MD Initiated Contact with Patient 12/21/20 1715     (approximate)  I have reviewed the triage vital signs and the nursing notes.   HISTORY  Chief Complaint Abdominal Pain  HPI April Chaney is a 50 y.o. female who presents to the emergency department for evaluation of left upper quadrant pain for the last few 2 to 3 days.  Patient states history of pancreatitis and states that this feels similar to prior presentations of this.  She reports associated nausea.  She also endorses pain with deep inspiration.  She denies any vomiting, constipation or diarrhea.  Denies any recent fevers, chest pain, cough, dysuria or other symptoms.   Of note, the patient does report that her father is being treated at hospital in [place] and 2 to 3 days ago instructed them to remove IVs and other life-saving measures.  The patient understandably became emotional and providing this history.       Past Medical History:  Diagnosis Date   Arthritis    Colon polyps    Complication of anesthesia    woke up during 1 colonscopy 3 yrs ago   Diabetes mellitus without complication ([place])    type 2   DM (diabetes mellitus) ([place])    Family history of adverse reaction to anesthesia    mother slow to awaken after 1 procedure   GERD (gastroesophageal reflux disease)    Hypertension    hx of no bp meds for last 11 years after weight loss   Pancreatitis 3-4 yrs ago and feb 2018   Presence of artificial hip    has to take antibiotics prior to any procedure-dental, etc   Ulcer     Patient Active Problem List   Diagnosis Date Noted   Acute CHF (congestive heart failure) ([place]) 04/05/2019   Epigastric pain    Alcohol-induced acute pancreatitis    Abdominal pain 08/03/2016   Encounter for health maintenance examination 10/22/2015   Rheumatoid arthritis involving  multiple sites with positive rheumatoid factor ([place]) 10/22/2015   BMI 35.0-35.9,adult 09/23/2015   Elevated rheumatoid factor 09/23/2015   History of adenomatous polyp of colon 09/23/2015   Alopecia 08/18/2015   Benign essential hypertension 08/18/2015   Benign neoplasm of colon 08/18/2015   Epigastric discomfort 08/18/2015   GERD without esophagitis 08/18/2015   Heartburn 08/18/2015   High risk medication use 08/18/2015   Hyperlipidemia 08/18/2015   Hyponatremia 08/18/2015   Infective otitis externa of left ear 08/18/2015   Insomnia 08/18/2015   Neuropathy of both feet 08/18/2015   Nicotine dependence 08/18/2015   Oligomenorrhea 08/18/2015   Obesity 08/18/2015   Gastro-esophageal reflux disease with esophagitis 08/18/2015   Stress incontinence in female 08/18/2015   Type 2 diabetes, uncontrolled, with neuropathy ([place]) 08/18/2015   Uncontrolled diabetes mellitus with complications ([place]) 67/20/9470   Vitamin D deficiency 08/18/2015   Wheezing 08/18/2015   Colon polyps 01/06/2014   Pancreatic mass 01/06/2014   Pancreatitis 01/06/2014    Past Surgical History:  Procedure Laterality Date   COLONOSCOPY WITH PROPOFOL N/A 02/11/2019   Procedure: COLONOSCOPY WITH PROPOFOL;  Surgeon: Lollie Sails, MD;  Location[facility]COPY;  Service: Endoscopy;  Laterality: N/A;   colonscopy     x 4   ESOPHAGOGASTRODUODENOSCOPY (EGD) WITH PROPOFOL N/A 02/11/2019   Procedure: ESOPHAGOGASTRODUODENOSCOPY (EGD) WITH PROPOFOL;  Surgeon: Lollie Sails, MD;  Location: [facility] ENDOSCOPY;  Service: Endoscopy;  Laterality: N/A;  EUS N/A 01/01/2017   Procedure: UPPER ENDOSCOPIC ULTRASOUND (EUS) LINEAR;  Surgeon: Milus Banister, MD;  Location: [facility] ENDOSCOPY;  Service: Endoscopy;  Laterality: N/A;   FINE NEEDLE ASPIRATION  01/01/2017   Procedure: FINE NEEDLE ASPIRATION;  Surgeon: Milus Banister, MD;  Location: [facility] ENDOSCOPY;  Service: Endoscopy;;   JOINT REPLACEMENT     hip replacement   LEFT HEART  CATH AND CORONARY ANGIOGRAPHY N/A 04/26/2019   Procedure: LEFT HEART CATH AND CORONARY ANGIOGRAPHY;  Surgeon: Isaias Cowman, MD;  Location: [facility] CV LAB;  Service: Cardiovascular;  Laterality: N/A;   STRABISMUS SURGERY  12/12/2011   Procedure: REPAIR STRABISMUS;  Surgeon: Derry Skill, MD;  Location: [facility];  Service: Ophthalmology;  Laterality: Right;   TOTAL HIP ARTHROPLASTY     bilat    Prior to Admission medications   Medication Sig Start Date End Date Taking? Authorizing Provider  albuterol (VENTOLIN HFA) 108 (90 Base) MCG/ACT inhaler Inhale 2 puffs into the lungs every 6 (six) hours as needed for wheezing or shortness of breath (8). Patient not taking: Reported on 04/26/2019 04/05/19   Arta Silence, MD  ARTIFICIAL TEAR OP Apply 1 drop to eye daily as needed (dry eyes).    [provider]  atorvastatin (LIPITOR) 40 MG tablet Take 40 mg by mouth daily at 12 noon. 04/14/19   [provider]  buPROPion (WELLBUTRIN XL) 300 MG 24 hr tablet Take 300 mg by mouth daily.  01/03/16   [provider]  CALCIUM CARBONATE-VITAMIN D PO Take 2 tablets by mouth daily with breakfast. 500 mg  2000 mg    [provider]  cholecalciferol (VITAMIN D) 1000 units tablet Take 2,000 Units by mouth daily.    [provider]  diphenhydramine-acetaminophen (TYLENOL PM EXTRA STRENGTH) 25-500 MG TABS tablet Take 2 tablets by mouth at bedtime as needed (sleep).    [provider]  folic acid (FOLVITE) 1 MG tablet Take 3 mg by mouth daily.    [provider]  furosemide (LASIX) 20 MG tablet Take 20 mg by mouth daily at 12 noon. 04/14/19   [provider]  hydroxychloroquine (PLAQUENIL) 200 MG tablet Take 200 mg by mouth 2 (two) times daily. 08/24/18   [provider]  ibuprofen (ADVIL,MOTRIN) 200 MG tablet Take 400 mg by mouth 3 (three) times daily as needed for headache or moderate pain.    [provider]  levonorgestrel (MIRENA) 20 MCG/24HR IUD 1 each by Intrauterine route once.     [provider]  lisinopril (ZESTRIL) 10 MG tablet Take 10 mg by mouth daily. 03/28/19   [provider]  metFORMIN (GLUCOPHAGE-XR) 500 MG 24 hr tablet Take 500 mg by mouth 2 (two) times daily.  07/24/16   [provider]  metoCLOPramide (REGLAN) 10 MG tablet Take 1 tablet (10 mg total) by mouth every 8 (eight) hours as needed for up to 5 days for nausea. Patient not taking: Reported on 04/21/2019 11/14/18 11/19/18  Arta Silence, MD  Multiple Vitamin (MULTIVITAMIN WITH MINERALS) TABS Take 1 tablet by mouth daily.    [provider]  omeprazole (PRILOSEC) 40 MG capsule Take 40 mg by mouth daily as needed (Heartburn).  03/07/19 03/06/20  [provider]  oxyCODONE-acetaminophen (PERCOCET) 5-325 MG tablet Take 1 tablet by mouth every 8 (eight) hours as needed. 10/16/19   Earleen Newport, MD  potassium chloride (KLOR-CON) 10 MEQ tablet Take 10 mEq by mouth daily at 12 noon.  04/14/19   [provider]  promethazine (PHENERGAN) 25 MG tablet Take 1 tablet (25 mg total) by mouth every 4 (four) hours as needed for nausea or vomiting. 10/16/19   Earleen Newport, MD  Tofacitinib Citrate (XELJANZ) 5 MG TABS Take 5 mg by mouth 2 (two) times daily.     [provider]  Turmeric 500 MG TABS Take 500 mg by mouth daily.    [provider]  valACYclovir (VALTREX) 1000 MG tablet Take 1,000 mg by mouth daily as needed (cold sores).  09/20/18   [provider]    Allergies Pantoprazole  Family History  Problem Relation Age of Onset   Diabetes Mother    Diabetes Father    Hypertension Father     Social History Social History   Tobacco Use   Smoking status: Some Days    Packs/day: 0.50    Pack years: 0.00    Types: Cigarettes   Smokeless tobacco: Never  Vaping Use   Vaping Use: Never used  Substance Use Topics   Alcohol  use: Yes    Comment: occ   Drug use: No    Review of Systems Constitutional: No fever/chills Eyes: No visual changes. ENT: No sore throat. Cardiovascular: Denies chest pain. Respiratory: + Increased pain with deep inspiration Gastrointestinal: + Left upper quadrant abdominal pain.  + nausea, no vomiting.  No diarrhea.  No constipation. Genitourinary: Negative for dysuria. Musculoskeletal: Negative for back pain. Skin: Negative for rash. Neurological: Negative for headaches, focal weakness or numbness.   ____________________________________________   PHYSICAL EXAM:  VITAL SIGNS: ED Triage Vitals  Enc Vitals Group     BP 12/21/20 1517 120/82     Pulse Rate 12/21/20 1517 (!) 104     Resp 12/21/20 1517 20     Temp 12/21/20 1517 98.7 F (37.1 C)     Temp Source 12/21/20 1517 Oral     SpO2 12/21/20 1517 97 %     Weight 12/21/20 1518 147 lb (66.7 kg)     Height 12/21/20 1518 5' (1.524 m)     Head Circumference --      Peak Flow --      Pain Score 12/21/20 1517 8     Pain Loc --      Pain Edu? --      Excl. in [place]? --    Constitutional: Alert and oriented. Well appearing and in no acute distress. Eyes: Conjunctivae are normal. PERRL. EOMI. Head: Atraumatic. Nose: No congestion/rhinnorhea. Mouth/Throat: Mucous membranes are moist.  Oropharynx non-erythematous. Neck: No stridor.   Cardiovascular: Mildly tachycardic, regular rhythm. Grossly normal heart sounds.  Good peripheral circulation. Respiratory: Normal respiratory effort.  No retractions. Lungs CTAB. Gastrointestinal: Tenderness to palpation of the left upper quadrant region with guarding present.  Nontender to palpation of the remainder of the abdomen. No distention. No abdominal bruits. No CVA tenderness. Musculoskeletal: No lower extremity tenderness nor edema.  No joint effusions. Neurologic:  Normal speech and language. No gross focal neurologic deficits are appreciated. No gait instability. Skin:  Skin is warm,  dry and intact. No rash noted. Psychiatric: Mood and affect are normal. Speech and behavior are normal.  ____________________________________________   LABS (all labs ordered are listed, but only abnormal results are displayed)  Labs Reviewed  COMPREHENSIVE METABOLIC PANEL - Abnormal; Notable for the following components:      Result Value   Glucose, Bld 320 (*)    BUN 27 (*)    Creatinine, Ser  1.06 (*)    All other components within normal limits  URINALYSIS, COMPLETE (UACMP) WITH MICROSCOPIC - Abnormal; Notable for the following components:   Color, Urine STRAW (*)    APPearance CLEAR (*)    Glucose, UA 50 (*)    All other components within normal limits  D-DIMER, QUANTITATIVE (NOT AT [facility]) - Abnormal; Notable for the following components:   D-Dimer, Quant 0.72 (*)    All other components within normal limits  LIPASE, BLOOD  CBC  POC URINE PREG, ED  TROPONIN I (HIGH SENSITIVITY)  TROPONIN I (HIGH SENSITIVITY)   ____________________________________________  EKG  Sinus tachycardia with a rate of 103, no ST elevation or depression. ____________________________________________  [place]  Official radiology report(s): No results found.   ____________________________________________   INITIAL IMPRESSION / ASSESSMENT AND PLAN / ED COURSE  As part of my medical decision making, I reviewed the following data within the [place] notes reviewed and incorporated, Labs reviewed, Patient signed out to Dr. Cheri Fowler, Evaluated by EM attending Dr. Cheri Fowler, and Notes from prior ED visits        Patient is a 51 year old female who presents to the emergency department for 2 to 3 days of worsening left upper quadrant pain that feels similar to her prior history of pancreatitis.  She reports associated nausea and pain with deep inspiration but denies any chest pain, dysuria or other abdominal pain.  In triage patient's mildly tachycardic with a rate of  104, otherwise has normal vital signs.  On physical exam patient has left upper quadrant pain with guarding present but no tenderness to the remainder of the abdomen.  Lung auscultation is within normal limits.  Differentials considered include PE, pneumonia, splenic infarct pancreatitis, diverticulitis, constipation among others.  CMP reveals elevated glucose in known diabetic with creatinine 1.06.  Remainder of CMP is within normal limits.  Urinalysis within normal limits.  Troponin negative.  CBC with no leukocytosis or other significant findings.  Patient's D-dimer was elevated at 0.72 and given her presentation feel that she would benefit from CT to rule out other intra-abdominal cause of her symptoms.  At this time, patient is being signed out to oncoming attending Dr. Cheri Fowler who will follow the patient's imaging and dispo appropriately at that time.  Patient stable at this time.      ____________________________________________   FINAL CLINICAL IMPRESSION(S) / ED DIAGNOSES  Final diagnoses:  None     ED Discharge Orders     None        Note:  This document was prepared using Dragon voice recognition software and may include unintentional dictation errors.    Marlana Salvage, PA 12/21/20 1939    Naaman Plummer, MD 12/21/20 2142

## 2020-12-21 NOTE — ED Notes (Signed)
Lab called for recollect of d dimer.

## 2020-12-21 NOTE — ED Notes (Signed)
Pt sts she had a fall 3 days ago and hit her L side on the edge of the shower.

## 2020-12-21 NOTE — ED Notes (Signed)
ED Provider at bedside. 

## 2020-12-21 NOTE — ED Notes (Signed)
Pt provided with crackers and soda at discharge per request.

## 2020-12-21 NOTE — ED Triage Notes (Signed)
Pt reports that she has a history of pancreatis, she developed LUQ pain. She denies N/V. She states that this is the same pain she always gets with her pancreatis.

## 2020-12-27 ENCOUNTER — Other Ambulatory Visit: Payer: Self-pay

## 2020-12-27 ENCOUNTER — Emergency Department: Payer: BC Managed Care – PPO

## 2020-12-27 ENCOUNTER — Inpatient Hospital Stay
Admission: EM | Admit: 2020-12-27 | Discharge: 2020-12-30 | DRG: 603 | Disposition: A | Discharge status: Home / Self Care | Payer: BC Managed Care – PPO | Attending: Internal Medicine | Admitting: Internal Medicine

## 2020-12-27 DIAGNOSIS — Z7984 Long term (current) use of oral hypoglycemic drugs: Secondary | ICD-10-CM

## 2020-12-27 DIAGNOSIS — Z8673 Personal history of transient ischemic attack (TIA), and cerebral infarction without residual deficits: Secondary | ICD-10-CM

## 2020-12-27 DIAGNOSIS — Z833 Family history of diabetes mellitus: Secondary | ICD-10-CM

## 2020-12-27 DIAGNOSIS — F17210 Nicotine dependence, cigarettes, uncomplicated: Secondary | ICD-10-CM | POA: Diagnosis present

## 2020-12-27 DIAGNOSIS — Z96643 Presence of artificial hip joint, bilateral: Secondary | ICD-10-CM | POA: Diagnosis present

## 2020-12-27 DIAGNOSIS — I5022 Chronic systolic (congestive) heart failure: Secondary | ICD-10-CM | POA: Diagnosis present

## 2020-12-27 DIAGNOSIS — L0291 Cutaneous abscess, unspecified: Secondary | ICD-10-CM | POA: Diagnosis present

## 2020-12-27 DIAGNOSIS — M0579 Rheumatoid arthritis with rheumatoid factor of multiple sites without organ or systems involvement: Secondary | ICD-10-CM | POA: Diagnosis present

## 2020-12-27 DIAGNOSIS — I110 Hypertensive heart disease with heart failure: Secondary | ICD-10-CM | POA: Diagnosis present

## 2020-12-27 DIAGNOSIS — Z716 Tobacco abuse counseling: Secondary | ICD-10-CM

## 2020-12-27 DIAGNOSIS — E1165 Type 2 diabetes mellitus with hyperglycemia: Secondary | ICD-10-CM | POA: Diagnosis present

## 2020-12-27 DIAGNOSIS — Z888 Allergy status to other drugs, medicaments and biological substances status: Secondary | ICD-10-CM

## 2020-12-27 DIAGNOSIS — F329 Major depressive disorder, single episode, unspecified: Secondary | ICD-10-CM | POA: Diagnosis present

## 2020-12-27 DIAGNOSIS — Z975 Presence of (intrauterine) contraceptive device: Secondary | ICD-10-CM

## 2020-12-27 DIAGNOSIS — L0211 Cutaneous abscess of neck: Principal | ICD-10-CM | POA: Diagnosis present

## 2020-12-27 DIAGNOSIS — L03811 Cellulitis of head [any part, except face]: Secondary | ICD-10-CM | POA: Diagnosis present

## 2020-12-27 DIAGNOSIS — Z20822 Contact with and (suspected) exposure to covid-19: Secondary | ICD-10-CM | POA: Diagnosis present

## 2020-12-27 DIAGNOSIS — F17200 Nicotine dependence, unspecified, uncomplicated: Secondary | ICD-10-CM | POA: Diagnosis present

## 2020-12-27 DIAGNOSIS — T8859XA Other complications of anesthesia, initial encounter: Secondary | ICD-10-CM | POA: Insufficient documentation

## 2020-12-27 DIAGNOSIS — Z79899 Other long term (current) drug therapy: Secondary | ICD-10-CM

## 2020-12-27 DIAGNOSIS — M1990 Unspecified osteoarthritis, unspecified site: Secondary | ICD-10-CM | POA: Diagnosis present

## 2020-12-27 DIAGNOSIS — E785 Hyperlipidemia, unspecified: Secondary | ICD-10-CM | POA: Diagnosis present

## 2020-12-27 DIAGNOSIS — K219 Gastro-esophageal reflux disease without esophagitis: Secondary | ICD-10-CM | POA: Diagnosis present

## 2020-12-27 DIAGNOSIS — Z635 Disruption of family by separation and divorce: Secondary | ICD-10-CM

## 2020-12-27 DIAGNOSIS — E1151 Type 2 diabetes mellitus with diabetic peripheral angiopathy without gangrene: Secondary | ICD-10-CM | POA: Diagnosis present

## 2020-12-27 DIAGNOSIS — I10 Essential (primary) hypertension: Secondary | ICD-10-CM | POA: Diagnosis not present

## 2020-12-27 DIAGNOSIS — R519 Headache, unspecified: Secondary | ICD-10-CM | POA: Diagnosis present

## 2020-12-27 DIAGNOSIS — K863 Pseudocyst of pancreas: Secondary | ICD-10-CM | POA: Diagnosis present

## 2020-12-27 LAB — HEMOGLOBIN A1C
Hgb A1c MFr Bld: 9.3 % — ABNORMAL HIGH (ref 4.8–5.6)
Mean Plasma Glucose: 220.21 mg/dL

## 2020-12-27 LAB — CBC WITH DIFFERENTIAL/PLATELET
Abs Immature Granulocytes: 0.08 10*3/uL — ABNORMAL HIGH (ref 0.00–0.07)
Basophils Absolute: 0.1 10*3/uL (ref 0.0–0.1)
Basophils Relative: 0 %
Eosinophils Absolute: 0.4 10*3/uL (ref 0.0–0.5)
Eosinophils Relative: 3 %
HCT: 42.3 % (ref 36.0–46.0)
Hemoglobin: 13.6 g/dL (ref 12.0–15.0)
Immature Granulocytes: 1 %
Lymphocytes Relative: 9 %
Lymphs Abs: 1.3 10*3/uL (ref 0.7–4.0)
MCH: 29.9 pg (ref 26.0–34.0)
MCHC: 32.2 g/dL (ref 30.0–36.0)
MCV: 93 fL (ref 80.0–100.0)
Monocytes Absolute: 0.9 10*3/uL (ref 0.1–1.0)
Monocytes Relative: 6 %
Neutro Abs: 10.7 10*3/uL — ABNORMAL HIGH (ref 1.7–7.7)
Neutrophils Relative %: 81 %
Platelets: 249 10*3/uL (ref 150–400)
RBC: 4.55 MIL/uL (ref 3.87–5.11)
RDW: 13.4 % (ref 11.5–15.5)
WBC: 13.3 10*3/uL — ABNORMAL HIGH (ref 4.0–10.5)
nRBC: 0 % (ref 0.0–0.2)

## 2020-12-27 LAB — LACTIC ACID, PLASMA
Lactic Acid, Venous: 2 mmol/L (ref 0.5–1.9)
Lactic Acid, Venous: 1.9 mmol/L (ref 0.5–1.9)

## 2020-12-27 LAB — RESP PANEL BY RT-PCR (FLU A&B, COVID) ARPGX2
Influenza A by PCR: NEGATIVE
Influenza B by PCR: NEGATIVE
SARS Coronavirus 2 by RT PCR: NEGATIVE

## 2020-12-27 LAB — COMPREHENSIVE METABOLIC PANEL
ALT: 23 U/L (ref 0–44)
AST: 24 U/L (ref 15–41)
Albumin: 4.2 g/dL (ref 3.5–5.0)
Alkaline Phosphatase: 66 U/L (ref 38–126)
Anion gap: 11 (ref 5–15)
BUN: 19 mg/dL (ref 6–20)
CO2: 24 mmol/L (ref 22–32)
Calcium: 8.9 mg/dL (ref 8.9–10.3)
Chloride: 97 mmol/L — ABNORMAL LOW (ref 98–111)
Creatinine, Ser: 0.72 mg/dL (ref 0.44–1.00)
GFR, Estimated: >60 mL/min (ref >60)
Glucose, Bld: 211 mg/dL — ABNORMAL HIGH (ref 70–99)
Potassium: 4 mmol/L (ref 3.5–5.1)
Sodium: 132 mmol/L — ABNORMAL LOW (ref 135–145)
Total Bilirubin: 0.8 mg/dL (ref 0.3–1.2)
Total Protein: 7.1 g/dL (ref 6.5–8.1)

## 2020-12-27 LAB — CBG MONITORING, ED
Glucose-Capillary: 197 mg/dL — ABNORMAL HIGH (ref 70–99)
Glucose-Capillary: 230 mg/dL — ABNORMAL HIGH (ref 70–99)

## 2020-12-27 LAB — GLUCOSE, CAPILLARY
Glucose-Capillary: 296 mg/dL — ABNORMAL HIGH (ref 70–99)
Glucose-Capillary: 201 mg/dL — ABNORMAL HIGH (ref 70–99)

## 2020-12-27 MED ORDER — VANCOMYCIN HCL IN DEXTROSE 1-5 GM/200ML-% IV SOLN: 1000.0000 mg | Freq: Once | INTRAVENOUS | Status: DC

## 2020-12-27 MED ORDER — ONDANSETRON HCL 4 MG/2ML IJ SOLN
4.0000 mg | Freq: Once | INTRAMUSCULAR | Status: AC
  Administered 2020-12-27: 4 mg via INTRAVENOUS
  Filled 2020-12-27: qty 2

## 2020-12-27 MED ORDER — CALCIUM CARBONATE-VITAMIN D 500-200 MG-UNIT PO TABS
2.0000 | ORAL_TABLET | Freq: Every day | ORAL | Status: DC
  Administered 2020-12-28 – 2020-12-30 (×3): 2 via ORAL
  Filled 2020-12-27 (×3): qty 2

## 2020-12-27 MED ORDER — ALBUTEROL SULFATE (2.5 MG/3ML) 0.083% IN NEBU: 2.5000 mg | INHALATION_SOLUTION | Freq: Four times a day (QID) | RESPIRATORY_TRACT | Status: DC | PRN

## 2020-12-27 MED ORDER — SODIUM CHLORIDE 0.9 % IV SOLN: INTRAVENOUS | Status: DC

## 2020-12-27 MED ORDER — ZOLPIDEM TARTRATE 5 MG PO TABS
5.0000 mg | ORAL_TABLET | Freq: Every evening | ORAL | Status: DC | PRN
  Administered 2020-12-27 – 2020-12-29 (×3): 5 mg via ORAL
  Filled 2020-12-27 (×3): qty 1

## 2020-12-27 MED ORDER — VANCOMYCIN HCL 1500 MG/300ML IV SOLN
1500.0000 mg | Freq: Once | INTRAVENOUS | Status: DC
  Filled 2020-12-27 (×2): qty 300

## 2020-12-27 MED ORDER — FOLIC ACID 1 MG PO TABS
3.0000 mg | ORAL_TABLET | Freq: Every day | ORAL | Status: DC
  Administered 2020-12-27 – 2020-12-30 (×4): 3 mg via ORAL
  Filled 2020-12-27 (×4): qty 3

## 2020-12-27 MED ORDER — MORPHINE SULFATE (PF) 4 MG/ML IV SOLN
4.0000 mg | Freq: Once | INTRAVENOUS | Status: AC
  Administered 2020-12-27: 4 mg via INTRAVENOUS
  Filled 2020-12-27: qty 1

## 2020-12-27 MED ORDER — VANCOMYCIN HCL 1250 MG/250ML IV SOLN
1250.0000 mg | INTRAVENOUS | Status: DC
  Filled 2020-12-27: qty 250

## 2020-12-27 MED ORDER — ONDANSETRON HCL 4 MG PO TABS: 4.0000 mg | ORAL_TABLET | Freq: Four times a day (QID) | ORAL | Status: DC | PRN

## 2020-12-27 MED ORDER — INSULIN ASPART 100 UNIT/ML IJ SOLN
0.0000 [IU] | INTRAMUSCULAR | Status: DC
  Administered 2020-12-27: 8 [IU] via SUBCUTANEOUS
  Administered 2020-12-27: 5 [IU] via SUBCUTANEOUS
  Administered 2020-12-28: 3 [IU] via SUBCUTANEOUS
  Administered 2020-12-28: 2 [IU] via SUBCUTANEOUS
  Administered 2020-12-28: 04:00:00 3 [IU] via SUBCUTANEOUS
  Administered 2020-12-28: 09:00:00 2 [IU] via SUBCUTANEOUS
  Administered 2020-12-28: 22:00:00 3 [IU] via SUBCUTANEOUS
  Administered 2020-12-28: 2 [IU] via SUBCUTANEOUS
  Administered 2020-12-29: 17:00:00 15 [IU] via SUBCUTANEOUS
  Administered 2020-12-29: 22:00:00 8 [IU] via SUBCUTANEOUS
  Administered 2020-12-29: 06:00:00 3 [IU] via SUBCUTANEOUS
  Administered 2020-12-29: 01:00:00 8 [IU] via SUBCUTANEOUS
  Administered 2020-12-29: 2 [IU] via SUBCUTANEOUS
  Administered 2020-12-29 – 2020-12-30 (×2): 5 [IU] via SUBCUTANEOUS
  Administered 2020-12-30 (×2): 3 [IU] via SUBCUTANEOUS
  Administered 2020-12-30: 5 [IU] via SUBCUTANEOUS
  Filled 2020-12-27 (×17): qty 1

## 2020-12-27 MED ORDER — LISINOPRIL 10 MG PO TABS
10.0000 mg | ORAL_TABLET | Freq: Every day | ORAL | Status: DC
  Administered 2020-12-27 – 2020-12-30 (×4): 10 mg via ORAL
  Filled 2020-12-27 (×5): qty 1

## 2020-12-27 MED ORDER — BUPROPION HCL ER (XL) 150 MG PO TB24
300.0000 mg | ORAL_TABLET | Freq: Every day | ORAL | Status: DC
  Filled 2020-12-27: qty 2

## 2020-12-27 MED ORDER — PANTOPRAZOLE SODIUM 40 MG PO TBEC: 40.0000 mg | DELAYED_RELEASE_TABLET | Freq: Every day | ORAL | Status: DC

## 2020-12-27 MED ORDER — IOHEXOL 300 MG/ML  SOLN
75.0000 mL | Freq: Once | INTRAMUSCULAR | Status: AC | PRN
  Administered 2020-12-27: 75 mL via INTRAVENOUS
  Filled 2020-12-27: qty 75

## 2020-12-27 MED ORDER — SODIUM CHLORIDE 0.9 % IV SOLN
1.0000 g | Freq: Once | INTRAVENOUS | Status: AC
  Administered 2020-12-27: 1 g via INTRAVENOUS
  Filled 2020-12-27: qty 10

## 2020-12-27 MED ORDER — ADULT MULTIVITAMIN W/MINERALS CH
1.0000 | ORAL_TABLET | Freq: Every day | ORAL | Status: DC
  Administered 2020-12-28 – 2020-12-30 (×3): 1 via ORAL
  Filled 2020-12-27 (×3): qty 1

## 2020-12-27 MED ORDER — VITAMIN D 25 MCG (1000 UNIT) PO TABS
2000.0000 [IU] | ORAL_TABLET | Freq: Every day | ORAL | Status: DC
  Administered 2020-12-28 – 2020-12-30 (×3): 2000 [IU] via ORAL
  Filled 2020-12-27 (×3): qty 2

## 2020-12-27 MED ORDER — POTASSIUM CHLORIDE CRYS ER 10 MEQ PO TBCR
10.0000 meq | EXTENDED_RELEASE_TABLET | Freq: Every day | ORAL | Status: DC
  Administered 2020-12-27: 20:00:00 10 meq via ORAL
  Filled 2020-12-27 (×2): qty 1

## 2020-12-27 MED ORDER — HYDROXYCHLOROQUINE SULFATE 200 MG PO TABS
200.0000 mg | ORAL_TABLET | Freq: Two times a day (BID) | ORAL | Status: DC
  Administered 2020-12-27: 200 mg via ORAL
  Filled 2020-12-27 (×3): qty 1

## 2020-12-27 MED ORDER — MORPHINE SULFATE (PF) 2 MG/ML IV SOLN
2.0000 mg | Freq: Once | INTRAVENOUS | Status: AC
  Administered 2020-12-27: 2 mg via INTRAVENOUS
  Filled 2020-12-27: qty 1

## 2020-12-27 MED ORDER — OMEPRAZOLE 20 MG PO CPDR
40.0000 mg | DELAYED_RELEASE_CAPSULE | Freq: Every day | ORAL | Status: DC
  Administered 2020-12-27 – 2020-12-30 (×4): 40 mg via ORAL
  Filled 2020-12-27 (×4): qty 2

## 2020-12-27 MED ORDER — ALBUTEROL SULFATE HFA 108 (90 BASE) MCG/ACT IN AERS: 2.0000 | INHALATION_SPRAY | Freq: Four times a day (QID) | RESPIRATORY_TRACT | Status: DC | PRN

## 2020-12-27 MED ORDER — ACETAMINOPHEN 650 MG RE SUPP: 650.0000 mg | Freq: Four times a day (QID) | RECTAL | Status: DC | PRN

## 2020-12-27 MED ORDER — HYDROCODONE-ACETAMINOPHEN 5-325 MG PO TABS
2.0000 | ORAL_TABLET | Freq: Four times a day (QID) | ORAL | Status: DC | PRN
  Administered 2020-12-27 – 2020-12-30 (×6): 2 via ORAL
  Filled 2020-12-27 (×6): qty 2

## 2020-12-27 MED ORDER — ACETAMINOPHEN 325 MG PO TABS: 650.0000 mg | ORAL_TABLET | Freq: Four times a day (QID) | ORAL | Status: DC | PRN

## 2020-12-27 MED ORDER — ONDANSETRON HCL 4 MG/2ML IJ SOLN: 4.0000 mg | Freq: Four times a day (QID) | INTRAMUSCULAR | Status: DC | PRN

## 2020-12-27 MED ORDER — FUROSEMIDE 40 MG PO TABS: 20.0000 mg | ORAL_TABLET | Freq: Every day | ORAL | Status: DC

## 2020-12-27 MED ORDER — SODIUM CHLORIDE 0.9 % IV SOLN: Freq: Once | INTRAVENOUS | Status: AC

## 2020-12-27 MED ORDER — ATORVASTATIN CALCIUM 20 MG PO TABS
40.0000 mg | ORAL_TABLET | Freq: Every day | ORAL | Status: DC
  Administered 2020-12-28 – 2020-12-29 (×2): 40 mg via ORAL
  Filled 2020-12-27 (×2): qty 2

## 2020-12-27 NOTE — Consult Note (Addendum)
Pharmacy Antibiotic Note  April Chaney is a 50 y.o. female admitted on 12/27/2020 with cellulitis. Pt c/o painful knot on the back of her head for the past 2 weeks; prescribed outpt doxycycline but only received 2 doses PTA. Pharmacy has been consulted for Vancomycin dosing.  7/07: CT scan of the neck with contrast shows 3 cm right suboccipital abscess with extensive surrounding inflammation.  Plan: Ordered MRSA PCR. F/u Cultures. Vancomycin 1.5g x1 loading dose; then vancomycin 1250mg  IV q24h Predicted AUC 468; Cmax 35; Cmin 10.2 Scr 0.8; Vd 0.72; on IBW Received Ceftriaxone 2g x1 in ED  Height: 5' (152.4 cm) Weight: 68 kg (150 lb) IBW/kg (Calculated) : 45.5  Temp (24hrs), Avg:98.1 F (36.7 C), Min:97.8 F (36.6 C), Max:98.4 F (36.9 C)  Recent Labs  Lab 12/21/20 1520 12/27/20 0816 12/27/20 1125  WBC 9.9 13.3*  --   CREATININE 1.06* 0.72  --   LATICACIDVEN  --  2.0* 1.9    Estimated Creatinine Clearance: 72.4 mL/min (by C-G formula based on SCr of 0.72 mg/dL).    Allergies  Allergen Reactions   Pantoprazole Rash    Antimicrobials this admission: Vancomycin 7/07 >>  Ceftriaxone (7/07)  Dose adjustments this admission: N/A CTM for adjustments prn  Microbiology results: 7/07 BCx: (single set) - pending 7/07 MRSA PCR: ordered  Thank you for allowing pharmacy to be a part of this patient's care.  Shanon Brow Lizette Pazos 12/27/2020 1:29 PM

## 2020-12-27 NOTE — ED Notes (Signed)
Pt given sandwich tray 

## 2020-12-27 NOTE — H&P (Signed)
History and Physical    April Chaney YHC:[id number] DOB: [birth year] DOA: 12/27/2020  PCP: Marinda Elk, MD   Patient coming from: Home  I have personally briefly reviewed patient's old medical records in [place]  Chief Complaint: Swelling on the back of her head  HPI: April Chaney is a 50 y.o. female with medical history significant for diabetes mellitus, hypertension, GERD, rheumatoid arthritis who presents to the ER for evaluation of a headache associated with pain to area radiating to the right side of the neck.  Patient states that she has had symptoms for over a week but it got worse in the last 24 hours.  She complains of increasing pain which she rates 6 x 10 in intensity at its worst and worsening swelling.  She was seen at the urgent care clinic and was prescribed doxycycline but only took 2 capsules prior to coming to the emergency room. She denies having any fever or chills, she denies having any trauma, no chest pain, no shortness of breath, no dizziness, no lightheadedness, no nausea, no vomiting, no abdominal pain, no urinary symptoms, no changes in her bowel habits, no palpitations or diaphoresis. Labs show sodium 132, potassium 4.0, chloride 97, bicarb 24, glucose 211, BUN 19, creatinine 0.72, calcium 8.9, alkaline phosphatase 66, albumin 4.2, AST 24, ALT 23, total protein 7.1, lactic acid 2.0 >>1.9, white count 13.3, hemoglobin 13.6, hematocrit 42.3, MCV 93, RDW 13.4, platelet count 249 Respiratory viral panel is pending CT scan of the head without contrast shows new extensive cerebral white matter disease. 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 No evidence of acute cortically based infarct or intracranial hemorrhage. Partially visualized posterior upper neck swelling, more fully evaluated on today's neck CT. CT scan  of the neck with contrast shows 3 cm right suboccipital abscess with extensive surrounding inflammation.    ED Course: Patient is a 50 year old female who presents to the ER for evaluation of pain and swelling in the submental area mostly on the right side extending to the right side of her neck. Imaging shows a right suboccipital abscess with extensive surrounding inflammation. Patient received a dose of IV Rocephin in the ER. Surgery was consulted who recommended to consult interventional radiology. Patient will be referred to observation status for further evaluation.   Review of Systems: As per HPI otherwise all other systems reviewed and negative.    Past Medical History:  Diagnosis Date   Arthritis    Colon polyps    Complication of anesthesia    woke up during 1 colonscopy 3 yrs ago   Diabetes mellitus without complication ([place])    type 2   DM (diabetes mellitus) ([place])    Family history of adverse reaction to anesthesia    mother slow to awaken after 1 procedure   GERD (gastroesophageal reflux disease)    Hypertension    hx of no bp meds for last 11 years after weight loss   Pancreatitis 3-4 yrs ago and feb 2018   Presence of artificial hip    has to take antibiotics prior to any procedure-dental, etc   Ulcer     Past Surgical History:  Procedure Laterality Date   COLONOSCOPY WITH PROPOFOL N/A 02/11/2019   Procedure: COLONOSCOPY WITH PROPOFOL;  Surgeon: Lollie Sails, MD;  Location: [facility] ENDOSCOPY;  Service: Endoscopy;  Laterality: N/A;   colonscopy  x 4   ESOPHAGOGASTRODUODENOSCOPY (EGD) WITH PROPOFOL N/A 02/11/2019   Procedure: ESOPHAGOGASTRODUODENOSCOPY (EGD) WITH PROPOFOL;  Surgeon: Lollie Sails, MD;  Location: [facility] ENDOSCOPY;  Service: Endoscopy;  Laterality: N/A;   EUS N/A 01/01/2017   Procedure: UPPER ENDOSCOPIC ULTRASOUND (EUS) LINEAR;  Surgeon: Milus Banister, MD;  Location: [facility] ENDOSCOPY;  Service: Endoscopy;  Laterality: N/A;   FINE NEEDLE  ASPIRATION  01/01/2017   Procedure: FINE NEEDLE ASPIRATION;  Surgeon: Milus Banister, MD;  Location: [facility] ENDOSCOPY;  Service: Endoscopy;;   JOINT REPLACEMENT     hip replacement   LEFT HEART CATH AND CORONARY ANGIOGRAPHY N/A 04/26/2019   Procedure: LEFT HEART CATH AND CORONARY ANGIOGRAPHY;  Surgeon: Isaias Cowman, MD;  Location: [facility];  Service: Cardiovascular;  Laterality: N/A;   STRABISMUS SURGERY  12/12/2011   Procedure: REPAIR STRABISMUS;  Surgeon: Derry Skill, MD;  Location: [facility];  Service: Ophthalmology;  Laterality: Right;   TOTAL HIP ARTHROPLASTY     bilat     reports that she has been smoking. She has been smoking an average of 0.50 packs per day. She has never used smokeless tobacco. She reports current alcohol use. She reports that she does not use drugs.  Allergies  Allergen Reactions   Pantoprazole Rash    Family History  Problem Relation Age of Onset   Diabetes Mother    Diabetes Father    Hypertension Father       Prior to Admission medications   Medication Sig Start Date End Date Taking? Authorizing Provider  albuterol (VENTOLIN HFA) 108 (90 Base) MCG/ACT inhaler Inhale 2 puffs into the lungs every 6 (six) hours as needed for wheezing or shortness of breath (8). Patient not taking: Reported on 04/26/2019 04/05/19   Arta Silence, MD  ARTIFICIAL TEAR OP Apply 1 drop to eye daily as needed (dry eyes).    [provider]  atorvastatin (LIPITOR) 40 MG tablet Take 40 mg by mouth daily at 12 noon. 04/14/19   [provider]  buPROPion (WELLBUTRIN XL) 300 MG 24 hr tablet Take 300 mg by mouth daily.  01/03/16   [provider]  CALCIUM CARBONATE-VITAMIN D PO Take 2 tablets by mouth daily with breakfast. 500 mg  2000 mg    [provider]  cholecalciferol (VITAMIN D) 1000 units tablet Take 2,000 Units by mouth daily.    [provider]  diphenhydramine-acetaminophen (TYLENOL PM  EXTRA STRENGTH) 25-500 MG TABS tablet Take 2 tablets by mouth at bedtime as needed (sleep).    [provider]  folic acid (FOLVITE) 1 MG tablet Take 3 mg by mouth daily.    [provider]  furosemide (LASIX) 20 MG tablet Take 20 mg by mouth daily at 12 noon. 04/14/19   [provider]  HYDROcodone-acetaminophen (NORCO) 5-325 MG tablet Take 2 tablets by mouth every 6 (six) hours as needed for moderate pain. 12/21/20   Naaman Plummer, MD  hydroxychloroquine (PLAQUENIL) 200 MG tablet Take 200 mg by mouth 2 (two) times daily. 08/24/18   [provider]  ibuprofen (ADVIL,MOTRIN) 200 MG tablet Take 400 mg by mouth 3 (three) times daily as needed for headache or moderate pain.    [provider]  levonorgestrel (MIRENA) 20 MCG/24HR IUD 1 each by Intrauterine route once.     [provider]  lisinopril (ZESTRIL) 10 MG tablet Take 10 mg by mouth daily. 03/28/19   [provider]  metFORMIN (GLUCOPHAGE-XR) 500 MG  24 hr tablet Take 500 mg by mouth 2 (two) times daily.  07/24/16   [provider]  metoCLOPramide (REGLAN) 10 MG tablet Take 1 tablet (10 mg total) by mouth every 8 (eight) hours as needed for up to 5 days for nausea. Patient not taking: Reported on 04/21/2019 11/14/18 11/19/18  Arta Silence, MD  Multiple Vitamin (MULTIVITAMIN WITH MINERALS) TABS Take 1 tablet by mouth daily.    [provider]  omeprazole (PRILOSEC) 40 MG capsule Take 40 mg by mouth daily as needed (Heartburn).  03/07/19 03/06/20  [provider]  oxyCODONE-acetaminophen (PERCOCET) 5-325 MG tablet Take 1 tablet by mouth every 8 (eight) hours as needed. 10/16/19   Earleen Newport, MD  potassium chloride (KLOR-CON) 10 MEQ tablet Take 10 mEq by mouth daily at 12 noon. 04/14/19   [provider]  promethazine (PHENERGAN) 25 MG tablet Take 1 tablet (25 mg total) by mouth every 4 (four) hours as needed for nausea or vomiting. 10/16/19    Earleen Newport, MD  Tofacitinib Citrate (XELJANZ) 5 MG TABS Take 5 mg by mouth 2 (two) times daily.     [provider]  Turmeric 500 MG TABS Take 500 mg by mouth daily.    [provider]  valACYclovir (VALTREX) 1000 MG tablet Take 1,000 mg by mouth daily as needed (cold sores).  09/20/18   [provider]    Physical Exam: Vitals:   12/27/20 0725 12/27/20 0830 12/27/20 0900 12/27/20 1211  BP: (!) 139/99 (!) 138/97 (!) 141/86 (!) 158/94  Pulse: 100 93 85 89  Resp: 16  18 18   Temp: 98.4 F (36.9 C)   97.8 F (36.6 C)  TempSrc: Oral   Oral  SpO2: 97%  99% 97%  Weight:      Height:         Vitals:   12/27/20 0725 12/27/20 0830 12/27/20 0900 12/27/20 1211  BP: (!) 139/99 (!) 138/97 (!) 141/86 (!) 158/94  Pulse: 100 93 85 89  Resp: 16  18 18   Temp: 98.4 F (36.9 C)   97.8 F (36.6 C)  TempSrc: Oral   Oral  SpO2: 97%  99% 97%  Weight:      Height:          Constitutional: Alert and oriented x 3 . Not in any apparent distress. Swelling involving the right occipital area extending to the right side of the neck HEENT:      Head: Normocephalic and atraumatic.         Eyes: PERLA, EOMI, Conjunctivae are normal. Sclera is non-icteric.       Mouth/Throat: Mucous membranes are moist.       Neck: Supple with no signs of meningismus. Cardiovascular: Regular rate and rhythm. No murmurs, gallops, or rubs. 2+ symmetrical distal pulses are present . No JVD. No LE edema Respiratory: Respiratory effort normal .Lungs sounds clear bilaterally. No wheezes, crackles, or rhonchi.  Gastrointestinal: Soft, non tender, and non distended with positive bowel sounds.  Genitourinary: No CVA tenderness. Musculoskeletal: Nontender with normal range of motion in all extremities. No cyanosis, or erythema of extremities. Neurologic:  Face is symmetric. Moving all extremities. No gross focal neurologic deficits . Skin: Skin is warm, dry.  No rash or ulcers Psychiatric:  Mood and affect are normal    Labs on Admission: I have personally reviewed following labs and imaging studies  CBC: Recent Labs  Lab 12/21/20 1520 12/27/20 0816  WBC 9.9 13.3*  NEUTROABS  --  10.7*  HGB 14.5 13.6  HCT 43.5 42.3  MCV 92.8 93.0  PLT 233 195   Basic Metabolic Panel: Recent Labs  Lab 12/21/20 1520 12/27/20 0816  NA 138 132*  K 4.1 4.0  CL 106 97*  CO2 22 24  GLUCOSE 320* 211*  BUN 27* 19  CREATININE 1.06* 0.72  CALCIUM 9.4 8.9   GFR: Estimated Creatinine Clearance: 72.4 mL/min (by C-G formula based on SCr of 0.72 mg/dL). Liver Function Tests: Recent Labs  Lab 12/21/20 1520 12/27/20 0816  AST 32 24  ALT 23 23  ALKPHOS 71 66  BILITOT 0.8 0.8  PROT 8.0 7.1  ALBUMIN 4.5 4.2   Recent Labs  Lab 12/21/20 1520  LIPASE 43   No results for input(s): AMMONIA in the last 168 hours. Coagulation Profile: No results for input(s): INR, PROTIME in the last 168 hours. Cardiac Enzymes: No results for input(s): CKTOTAL, CKMB, CKMBINDEX, TROPONINI in the last 168 hours. BNP (last 3 results) No results for input(s): PROBNP in the last 8760 hours. HbA1C: No results for input(s): HGBA1C in the last 72 hours. CBG: Recent Labs  Lab 12/27/20 1209  GLUCAP 197*   Lipid Profile: No results for input(s): CHOL, HDL, LDLCALC, TRIG, CHOLHDL, LDLDIRECT in the last 72 hours. Thyroid Function Tests: No results for input(s): TSH, T4TOTAL, FREET4, T3FREE, THYROIDAB in the last 72 hours. Anemia Panel: No results for input(s): VITAMINB12, FOLATE, FERRITIN, TIBC, IRON, RETICCTPCT in the last 72 hours. Urine analysis:    Component Value Date/Time   COLORURINE STRAW (A) 12/21/2020 1520   APPEARANCEUR CLEAR (A) 12/21/2020 1520   LABSPEC 1.009 12/21/2020 1520   PHURINE 5.0 12/21/2020 1520   GLUCOSEU 50 (A) 12/21/2020 1520   HGBUR NEGATIVE 12/21/2020 1520   BILIRUBINUR NEGATIVE 12/21/2020 1520   KETONESUR NEGATIVE 12/21/2020 1520   PROTEINUR NEGATIVE 12/21/2020 1520    UROBILINOGEN 0.2 12/04/2013 0759   NITRITE NEGATIVE 12/21/2020 1520   LEUKOCYTESUR NEGATIVE 12/21/2020 1520    Radiological Exams on Admission: CT Head Wo Contrast  Result Date: 12/27/2020 CLINICAL DATA:  Painful knot on the back of the head for 2 weeks. EXAM: CT HEAD WITHOUT CONTRAST TECHNIQUE: Contiguous axial images were obtained from the base of the skull through the vertex without intravenous contrast. COMPARISON:  03/11/2011 FINDINGS: Brain: There is no evidence of an acute cortically based infarct, intracranial hemorrhage, mass, midline shift, or extra-axial fluid collection. There is new extensive confluent hypoattenuation throughout the cerebral white matter bilaterally as well as patchy hypodensity in the thalami. New focal subcentimeter hypodensities in the left basal ganglia are compatible with likely chronic lacunar infarcts. There has been mild interval cerebral volume loss. Vascular: Calcified atherosclerosis at the skull base. No hyperdense vessel. Skull: No fracture or suspicious osseous lesion. Sinuses/Orbits: Visualized paranasal sinuses and mastoid air cells are clear. Bilateral proptosis. Other: Partially visualized swelling in the posterior aspect of the upper neck on the right, more fully evaluated on today's neck CT. IMPRESSION: 1. New extensive cerebral white matter disease. 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 2. No evidence of acute cortically based infarct or intracranial hemorrhage. 3. Partially visualized posterior upper neck swelling, more fully evaluated on today's neck CT. Electronically Signed   By: Logan Bores M.D.   On: 12/27/2020 09:57   CT Soft Tissue Neck W Contrast  Result Date: 12/27/2020 CLINICAL DATA:  Right-sided neck swelling. Painful  knot for 2 weeks. EXAM: CT NECK WITH CONTRAST TECHNIQUE: Multidetector CT imaging of the  neck was performed using the standard protocol following the bolus administration of intravenous contrast. CONTRAST:  64mL OMNIPAQUE IOHEXOL 300 MG/ML  SOLN COMPARISON:  None. FINDINGS: Pharynx and larynx: No evidence of mass or swelling. Patent airway. Mild right-sided retropharyngeal edema. Salivary glands: Bilateral submandibular gland atrophy. No salivary mass, stone, or acute inflammation identified. Thyroid: Unremarkable. Lymph nodes: No enlarged or suspicious lymph nodes in the neck. Vascular: Major vascular structures of the neck are grossly patent. Limited intracranial: More fully evaluated on today's head CT. Visualized orbits: Bilateral proptosis. No orbital mass or inflammation. Mastoids and visualized paranasal sinuses: Mild mucosal thickening in the left maxillary sinus. Clear mastoid air cells. Skeleton: No acute osseous abnormality or suspicious osseous lesion. Mild cervical spondylosis. Upper chest: Clear lung apices. Other: A rim enhancing low-density collection in the right suboccipital soft tissues measures 2.9 x 2.3 x 3.3 cm. There is extensive surrounding inflammation involving the right-sided posterior neck musculature and overlying subcutaneous fat with swelling extending laterally and inferiorly in the neck. There is also milder inflammation more anteriorly in the neck on the right including in the right carotid space. IMPRESSION: 3 cm right suboccipital abscess with extensive surrounding inflammation. Electronically Signed   By: Logan Bores M.D.   On: 12/27/2020 10:05     Assessment/Plan Principal Problem:   Abscess Active Problems:   Benign essential hypertension   GERD without esophagitis   Nicotine dependence   Rheumatoid arthritis involving multiple sites with positive rheumatoid factor (HCC)      Abscess Involving the suboccipital area with extensive surrounding inflammation in a patient with immunosuppressive therapy Start patient empirically on vancomycin Consult  interventional radiology for drainage Pain control    Diabetes mellitus Hold metformin Keep patient n.p.o. for now Glycemic control with sliding scale insulin    History of rheumatoid arthritis Continue hydroxychloroquine Hold Xeljanz for now    Nicotine dependence Smoking cessation has been discussed with patient in detail She declines a nicotine transdermal patch at this time    Hypertension Continue lisinopril    GERD Continue Protonix   DVT prophylaxis: SCD  Code Status: full code  Family Communication: 50% of time was spent discussing patient's condition and plan of care with her at the bedside.  All questions and concerns have been addressed.  She verbalizes understanding and agrees to the plan. Disposition Plan: Back to previous home environment Consults called: Surgery, interventional radiology Status: Observation    Tateanna Bach MD [facility]     12/27/2020, 1:42 PM

## 2020-12-27 NOTE — ED Notes (Signed)
Patient transported to CT 

## 2020-12-27 NOTE — ED Triage Notes (Signed)
Pt c/o painful knot on the back of her head for the past 2 weeks and was seen at [facility] and given abx states she was told if it did not improve or worsened to come to the ED for treatment.

## 2020-12-27 NOTE — ED Provider Notes (Signed)
[facility] Emergency Department Provider Note  ____________________________________________   Event Date/Time   First MD Initiated Contact with Patient 12/27/20 [phone]     (approximate)  I have reviewed the triage vital signs and the nursing notes.   HISTORY  Chief Complaint Headache    HPI April Chaney is a 50 y.o. female presents emergency department complaining of pain to the posterior part of the scalp along with pain radiating around into the right side of the neck.  Patient states she was seen in urgent care and they started her on antibiotic and gave her Toradol for pain.  She has been having pain in her neck for several weeks.  She has seen a chiropractor twice for an adjustment.  Swelling started 2 days ago.  She denies any difficulty breathing.  She has had no fever or chills.  Patient had a fall last week in which she landed on her ribs but states she did not hit her head or her neck.  Past Medical History:  Diagnosis Date   Arthritis    Colon polyps    Complication of anesthesia    woke up during 1 colonscopy 3 yrs ago   Diabetes mellitus without complication ([place])    type 2   DM (diabetes mellitus) ([place])    Family history of adverse reaction to anesthesia    mother slow to awaken after 1 procedure   GERD (gastroesophageal reflux disease)    Hypertension    hx of no bp meds for last 11 years after weight loss   Pancreatitis 3-4 yrs ago and feb 2018   Presence of artificial hip    has to take antibiotics prior to any procedure-dental, etc   Ulcer     Patient Active Problem List   Diagnosis Date Noted   Abscess 12/27/2020   Acute CHF (congestive heart failure) ([place]) 04/05/2019   Epigastric pain    Alcohol-induced acute pancreatitis    Abdominal pain 08/03/2016   Encounter for health maintenance examination 10/22/2015   Rheumatoid arthritis involving multiple sites with positive rheumatoid factor ([place]) 10/22/2015   BMI  35.0-35.9,adult 09/23/2015   Elevated rheumatoid factor 09/23/2015   History of adenomatous polyp of colon 09/23/2015   Alopecia 08/18/2015   Benign essential hypertension 08/18/2015   Benign neoplasm of colon 08/18/2015   Epigastric discomfort 08/18/2015   GERD without esophagitis 08/18/2015   Heartburn 08/18/2015   High risk medication use 08/18/2015   Hyperlipidemia 08/18/2015   Hyponatremia 08/18/2015   Infective otitis externa of left ear 08/18/2015   Insomnia 08/18/2015   Neuropathy of both feet 08/18/2015   Nicotine dependence 08/18/2015   Oligomenorrhea 08/18/2015   Obesity 08/18/2015   Gastro-esophageal reflux disease with esophagitis 08/18/2015   Stress incontinence in female 08/18/2015   Type 2 diabetes, uncontrolled, with neuropathy ([place]) 08/18/2015   Uncontrolled diabetes mellitus with complications ([place]) 63/14/9702   Vitamin D deficiency 08/18/2015   Wheezing 08/18/2015   Colon polyps 01/06/2014   Pancreatic mass 01/06/2014   Pancreatitis 01/06/2014    Past Surgical History:  Procedure Laterality Date   COLONOSCOPY WITH PROPOFOL N/A 02/11/2019   Procedure: COLONOSCOPY WITH PROPOFOL;  Surgeon: Lollie Sails, MD;  Location[facility] ENDOSCOPY;  Service: Endoscopy;  Laterality: N/A;   colonscopy     x 4   ESOPHAGOGASTRODUODENOSCOPY (EGD) WITH PROPOFOL N/A 02/11/2019   Procedure: ESOPHAGOGASTRODUODENOSCOPY (EGD) WITH PROPOFOL;  Surgeon: Lollie Sails, MD;  Location: [facility] ENDOSCOPY;  Service: Endoscopy;  Laterality: N/A;  EUS N/A 01/01/2017   Procedure: UPPER ENDOSCOPIC ULTRASOUND (EUS) LINEAR;  Surgeon: Milus Banister, MD;  Location: [facility] ENDOSCOPY;  Service: Endoscopy;  Laterality: N/A;   FINE NEEDLE ASPIRATION  01/01/2017   Procedure: FINE NEEDLE ASPIRATION;  Surgeon: Milus Banister, MD;  Location: [facility] ENDOSCOPY;  Service: Endoscopy;;   JOINT REPLACEMENT     hip replacement   LEFT HEART CATH AND CORONARY ANGIOGRAPHY N/A 04/26/2019   Procedure: LEFT HEART  CATH AND CORONARY ANGIOGRAPHY;  Surgeon: Isaias Cowman, MD;  Location: [facility];  Service: Cardiovascular;  Laterality: N/A;   STRABISMUS SURGERY  12/12/2011   Procedure: REPAIR STRABISMUS;  Surgeon: Derry Skill, MD;  Location: [facility];  Service: Ophthalmology;  Laterality: Right;   TOTAL HIP ARTHROPLASTY     bilat    Prior to Admission medications   Medication Sig Start Date End Date Taking? Authorizing Provider  albuterol (VENTOLIN HFA) 108 (90 Base) MCG/ACT inhaler Inhale 2 puffs into the lungs every 6 (six) hours as needed for wheezing or shortness of breath (8). Patient not taking: Reported on 04/26/2019 04/05/19   Arta Silence, MD  ARTIFICIAL TEAR OP Apply 1 drop to eye daily as needed (dry eyes).    [provider]  atorvastatin (LIPITOR) 40 MG tablet Take 40 mg by mouth daily at 12 noon. 04/14/19   [provider]  buPROPion (WELLBUTRIN XL) 300 MG 24 hr tablet Take 300 mg by mouth daily.  01/03/16   [provider]  CALCIUM CARBONATE-VITAMIN D PO Take 2 tablets by mouth daily with breakfast. 500 mg  2000 mg    [provider]  cholecalciferol (VITAMIN D) 1000 units tablet Take 2,000 Units by mouth daily.    [provider]  diphenhydramine-acetaminophen (TYLENOL PM EXTRA STRENGTH) 25-500 MG TABS tablet Take 2 tablets by mouth at bedtime as needed (sleep).    [provider]  folic acid (FOLVITE) 1 MG tablet Take 3 mg by mouth daily.    [provider]  furosemide (LASIX) 20 MG tablet Take 20 mg by mouth daily at 12 noon. 04/14/19   [provider]  HYDROcodone-acetaminophen (NORCO) 5-325 MG tablet Take 2 tablets by mouth every 6 (six) hours as needed for moderate pain. 12/21/20   Naaman Plummer, MD  hydroxychloroquine (PLAQUENIL) 200 MG tablet Take 200 mg by mouth 2 (two) times daily. 08/24/18   [provider]  ibuprofen (ADVIL,MOTRIN) 200 MG tablet Take 400 mg by  mouth 3 (three) times daily as needed for headache or moderate pain.    [provider]  levonorgestrel (MIRENA) 20 MCG/24HR IUD 1 each by Intrauterine route once.     [provider]  lisinopril (ZESTRIL) 10 MG tablet Take 10 mg by mouth daily. 03/28/19   [provider]  metFORMIN (GLUCOPHAGE-XR) 500 MG 24 hr tablet Take 500 mg by mouth 2 (two) times daily.  07/24/16   [provider]  metoCLOPramide (REGLAN) 10 MG tablet Take 1 tablet (10 mg total) by mouth every 8 (eight) hours as needed for up to 5 days for nausea. Patient not taking: Reported on 04/21/2019 11/14/18 11/19/18  Arta Silence, MD  Multiple Vitamin (MULTIVITAMIN WITH MINERALS) TABS Take 1 tablet by mouth daily.    [provider]  omeprazole (PRILOSEC) 40 MG capsule Take 40 mg by mouth daily as needed (Heartburn).  03/07/19 03/06/20  [provider]  oxyCODONE-acetaminophen (PERCOCET) 5-325 MG tablet Take 1 tablet by mouth every 8 (  eight) hours as needed. 10/16/19   Earleen Newport, MD  potassium chloride (KLOR-CON) 10 MEQ tablet Take 10 mEq by mouth daily at 12 noon. 04/14/19   [provider]  promethazine (PHENERGAN) 25 MG tablet Take 1 tablet (25 mg total) by mouth every 4 (four) hours as needed for nausea or vomiting. 10/16/19   Earleen Newport, MD  Tofacitinib Citrate (XELJANZ) 5 MG TABS Take 5 mg by mouth 2 (two) times daily.     [provider]  Turmeric 500 MG TABS Take 500 mg by mouth daily.    [provider]  valACYclovir (VALTREX) 1000 MG tablet Take 1,000 mg by mouth daily as needed (cold sores).  09/20/18   [provider]    Allergies Pantoprazole  Family History  Problem Relation Age of Onset   Diabetes Mother    Diabetes Father    Hypertension Father     Social History Social History   Tobacco Use   Smoking status: Some Days    Packs/day: 0.50    Pack years: 0.00    Types: Cigarettes   Smokeless  tobacco: Never  Vaping Use   Vaping Use: Never used  Substance Use Topics   Alcohol use: Yes    Comment: occ   Drug use: No    Review of Systems  Constitutional: No fever/chills Eyes: No visual changes. ENT: No sore throat. Respiratory: Denies cough Cardiovascular: Denies chest pain Gastrointestinal: Denies abdominal pain Genitourinary: Negative for dysuria. Musculoskeletal: Negative for back pain. Skin: Negative for rash. Psychiatric: no mood changes,     ____________________________________________   PHYSICAL EXAM:  VITAL SIGNS: ED Triage Vitals  Enc Vitals Group     BP 12/27/20 0725 (!) 139/99     Pulse Rate 12/27/20 0725 100     Resp 12/27/20 0725 16     Temp 12/27/20 0725 98.4 F (36.9 C)     Temp Source 12/27/20 0725 Oral     SpO2 12/27/20 0725 97 %     Weight 12/27/20 0711 150 lb (68 kg)     Height 12/27/20 0711 5' (1.524 m)     Head Circumference --      Peak Flow --      Pain Score 12/27/20 0711 8     Pain Loc --      Pain Edu? --      Excl. in [place]? --     Constitutional: Alert and oriented. Well appearing and in no acute distress. Eyes: Conjunctivae are normal.  Head: Atraumatic. Nose: No congestion/rhinnorhea. Mouth/Throat: Mucous membranes are moist.   Neck:   Supple, large amount of swelling noted to the right side in the soft tissues, starts posteriorly and is located at the anterior part past the tonsillar glands. Cardiovascular: Normal rate, regular rhythm. Heart sounds are normal GU: deferred Musculoskeletal: FROM all extremities, warm and well perfused Neurologic:  Normal speech and language.  Skin:  Skin is warm, dry and intact. No rash noted. Psychiatric: Mood and affect are normal. Speech and behavior are normal.  ____________________________________________   LABS (all labs ordered are listed, but only abnormal results are displayed)  Labs Reviewed  COMPREHENSIVE METABOLIC PANEL - Abnormal; Notable for the following components:       Result Value   Sodium 132 (*)    Chloride 97 (*)    Glucose, Bld 211 (*)    All other components within normal limits  CBC WITH DIFFERENTIAL/PLATELET - Abnormal; Notable for the following components:  WBC 13.3 (*)    Neutro Abs 10.7 (*)    Abs Immature Granulocytes 0.08 (*)    All other components within normal limits  LACTIC ACID, PLASMA - Abnormal; Notable for the following components:   Lactic Acid, Venous 2.0 (*)    All other components within normal limits  CBG MONITORING, ED - Abnormal; Notable for the following components:   Glucose-Capillary 197 (*)    All other components within normal limits  CULTURE, BLOOD (SINGLE)  RESP PANEL BY RT-PCR (FLU A&B, COVID) ARPGX2  LACTIC ACID, PLASMA  HEMOGLOBIN A1C   ____________________________________________   ____________________________________________  RADIOLOGY  CT of the head, CT soft tissue of the neck  ____________________________________________   PROCEDURES  Procedure(s) performed:  rocephin 1g iv  Procedures    ____________________________________________   INITIAL IMPRESSION / ASSESSMENT AND PLAN / ED COURSE  Pertinent labs & imaging results that were available during my care of the patient were reviewed by me and considered in my medical decision making (see chart for details).   The patient is a 50 year old female presents with swelling of the neck and posterior scalp.  See HPI.  Physical exam shows patient per stable  DDx: Cellulitis, abscess, C-spine fracture, mastoiditis  Labs and imaging ordered  CBC has elevated WBC of 13.3, comprehensive metabolic panel has elevated glucose 211, lactic acid is elevated 2.0.  Patient be given fluids to see if this helps correct the lactic acid reading.  Blood culture is in process.  Do not feel that the patient is septic.  Do have concerns that she has infection.  CT of the head, CT soft tissue of the neck with IV contrast shows a suboccipital abscess with a  large amount of inflammation, is approximately 30 mm deep This was reviewed by me and confirmed by radiology  Did discuss case with Dr. Tamala Julian.  He states we should speak with radiologist concerning the carotid.  He spoke with radiology and they told him we do not need to do a CTA to evaluate carotid.  He also paged Dr. Peyton Najjar who is going to look at the abscess.   Dr. Peyton Najjar along states we need IR to drain abscess and have hospitalist admit.  Spoke with Dr.Agbata for admission.  She will be admitting the patient.  Dr. Peyton Najjar had already spoken with IR.  She is in stable condition at this time  April Chaney was evaluated in Emergency Department on 12/27/2020 for the symptoms described in the history of present illness. She was evaluated in the context of the global COVID-19 pandemic, which necessitated consideration that the patient might be at risk for infection with the SARS-CoV-2 virus that causes COVID-19. Institutional protocols and algorithms that pertain to the evaluation of patients at risk for COVID-19 are in a state of rapid change based on information released by regulatory bodies including the CDC and federal and state organizations. These policies and algorithms were followed during the patient's care in the ED.    As part of my medical decision making, I reviewed the following data within the [place] notes reviewed and incorporated, Labs reviewed , Old chart reviewed, Radiograph reviewed , Discussed with admitting physician , A consult was requested and obtained from this/these consultant(s) Surgery, Evaluated by EM attending , Notes from prior ED visits, and [place] Controlled Substance Database  ____________________________________________   FINAL CLINICAL IMPRESSION(S) / ED DIAGNOSES  Final diagnoses:  Neck abscess      NEW MEDICATIONS STARTED DURING  THIS VISIT:  New Prescriptions   No medications on file     Note:  This document was  prepared using Dragon voice recognition software and may include unintentional dictation errors.    Versie Starks, PA-C 12/27/20 1242    Lucrezia Starch, MD 12/27/20 [phone]

## 2020-12-27 NOTE — Consult Note (Signed)
SURGICAL CONSULTATION NOTE   HISTORY OF PRESENT ILLNESS (HPI):  50 y.o. female presented to [facility] ED for evaluation of neck pain. Patient reports pain in her posterior neck since 2 to 4 days ago.  Pain localized to the posterior neck.  Pain radiates to her lower neck.  Pain aggravated by applying pressure.  There has been no alleviating factors.  Patient denies any fever or chills.  Patient denies any trauma or any procedures done on the area.  At the ED she was found leukocytosis of 13,000.  CT scan of the neck shows a rim-enhancing fluid collection consistent with abscess.  I personally evaluated the images and I discussed the images with interventional radiology and there was a concern between abscess versus necrotic mass.  Patient also with uncontrolled blood glucose.  Surgery is consulted by Dr. Tamala Julian in this context for evaluation and management of abscess of the neck.  PAST MEDICAL HISTORY (PMH):  Past Medical History:  Diagnosis Date   Arthritis    Colon polyps    Complication of anesthesia    woke up during 1 colonscopy 3 yrs ago   Diabetes mellitus without complication ([place])    type 2   DM (diabetes mellitus) ([place])    Family history of adverse reaction to anesthesia    mother slow to awaken after 1 procedure   GERD (gastroesophageal reflux disease)    Hypertension    hx of no bp meds for last 11 years after weight loss   Pancreatitis 3-4 yrs ago and feb 2018   Presence of artificial hip    has to take antibiotics prior to any procedure-dental, etc   Ulcer      PAST SURGICAL HISTORY ([place]):  Past Surgical History:  Procedure Laterality Date   COLONOSCOPY WITH PROPOFOL N/A 02/11/2019   Procedure: COLONOSCOPY WITH PROPOFOL;  Surgeon: Lollie Sails, MD;  Location: [facility] ENDOSCOPY;  Service: Endoscopy;  Laterality: N/A;   colonscopy     x 4   ESOPHAGOGASTRODUODENOSCOPY (EGD) WITH PROPOFOL N/A 02/11/2019   Procedure: ESOPHAGOGASTRODUODENOSCOPY (EGD) WITH PROPOFOL;   Surgeon: Lollie Sails, MD;  Location: [facility] ENDOSCOPY;  Service: Endoscopy;  Laterality: N/A;   EUS N/A 01/01/2017   Procedure: UPPER ENDOSCOPIC ULTRASOUND (EUS) LINEAR;  Surgeon: Milus Banister, MD;  Location: [facility] ENDOSCOPY;  Service: Endoscopy;  Laterality: N/A;   FINE NEEDLE ASPIRATION  01/01/2017   Procedure: FINE NEEDLE ASPIRATION;  Surgeon: Milus Banister, MD;  Location: [facility] ENDOSCOPY;  Service: Endoscopy;;   JOINT REPLACEMENT     hip replacement   LEFT HEART CATH AND CORONARY ANGIOGRAPHY N/A 04/26/2019   Procedure: LEFT HEART CATH AND CORONARY ANGIOGRAPHY;  Surgeon: Isaias Cowman, MD;  Location: [place] [facility];  Service: Cardiovascular;  Laterality: N/A;   STRABISMUS SURGERY  12/12/2011   Procedure: REPAIR STRABISMUS;  Surgeon: Derry Skill, MD;  Location: [place];  Service: Ophthalmology;  Laterality: Right;   TOTAL HIP ARTHROPLASTY     bilat     MEDICATIONS:  Prior to Admission medications   Medication Sig Start Date End Date Taking? Authorizing Provider  albuterol (VENTOLIN HFA) 108 (90 Base) MCG/ACT inhaler Inhale 2 puffs into the lungs every 6 (six) hours as needed for wheezing or shortness of breath (8). Patient not taking: Reported on 04/26/2019 04/05/19   Arta Silence, MD  ARTIFICIAL TEAR OP Apply 1 drop to eye daily as needed (dry eyes).    [provider]  atorvastatin (LIPITOR) 40 MG tablet  Take 40 mg by mouth daily at 12 noon. 04/14/19   [provider]  buPROPion (WELLBUTRIN XL) 300 MG 24 hr tablet Take 300 mg by mouth daily.  01/03/16   [provider]  CALCIUM CARBONATE-VITAMIN D PO Take 2 tablets by mouth daily with breakfast. 500 mg  2000 mg    [provider]  cholecalciferol (VITAMIN D) 1000 units tablet Take 2,000 Units by mouth daily.    [provider]  diphenhydramine-acetaminophen (TYLENOL PM EXTRA STRENGTH) 25-500 MG TABS tablet Take 2 tablets by mouth at bedtime as needed  (sleep).    [provider]  folic acid (FOLVITE) 1 MG tablet Take 3 mg by mouth daily.    [provider]  furosemide (LASIX) 20 MG tablet Take 20 mg by mouth daily at 12 noon. 04/14/19   [provider]  HYDROcodone-acetaminophen (NORCO) 5-325 MG tablet Take 2 tablets by mouth every 6 (six) hours as needed for moderate pain. 12/21/20   Naaman Plummer, MD  hydroxychloroquine (PLAQUENIL) 200 MG tablet Take 200 mg by mouth 2 (two) times daily. 08/24/18   [provider]  ibuprofen (ADVIL,MOTRIN) 200 MG tablet Take 400 mg by mouth 3 (three) times daily as needed for headache or moderate pain.    [provider]  levonorgestrel (MIRENA) 20 MCG/24HR IUD 1 each by Intrauterine route once.     [provider]  lisinopril (ZESTRIL) 10 MG tablet Take 10 mg by mouth daily. 03/28/19   [provider]  metFORMIN (GLUCOPHAGE-XR) 500 MG 24 hr tablet Take 500 mg by mouth 2 (two) times daily.  07/24/16   [provider]  metoCLOPramide (REGLAN) 10 MG tablet Take 1 tablet (10 mg total) by mouth every 8 (eight) hours as needed for up to 5 days for nausea. Patient not taking: Reported on 04/21/2019 11/14/18 11/19/18  Arta Silence, MD  Multiple Vitamin (MULTIVITAMIN WITH MINERALS) TABS Take 1 tablet by mouth daily.    [provider]  omeprazole (PRILOSEC) 40 MG capsule Take 40 mg by mouth daily as needed (Heartburn).  03/07/19 03/06/20  [provider]  oxyCODONE-acetaminophen (PERCOCET) 5-325 MG tablet Take 1 tablet by mouth every 8 (eight) hours as needed. 10/16/19   Earleen Newport, MD  potassium chloride (KLOR-CON) 10 MEQ tablet Take 10 mEq by mouth daily at 12 noon. 04/14/19   [provider]  promethazine (PHENERGAN) 25 MG tablet Take 1 tablet (25 mg total) by mouth every 4 (four) hours as needed for nausea or vomiting. 10/16/19   Earleen Newport, MD  Tofacitinib Citrate (XELJANZ) 5 MG TABS Take 5 mg by mouth  2 (two) times daily.     [provider]  Turmeric 500 MG TABS Take 500 mg by mouth daily.    [provider]  valACYclovir (VALTREX) 1000 MG tablet Take 1,000 mg by mouth daily as needed (cold sores).  09/20/18   [provider]     ALLERGIES:  Allergies  Allergen Reactions   Pantoprazole Rash     SOCIAL HISTORY:  Social History   Socioeconomic History   Marital status: Legally Separated    Spouse name: Not on file   Number of children: Not on file   Years of education: Not on file   Highest education level: Not on file  Occupational History   Not on file  Tobacco Use   Smoking status: Some Days    Packs/day: 0.50    Pack years: 0.00  Types: Cigarettes   Smokeless tobacco: Never  Vaping Use   Vaping Use: Never used  Substance and Sexual Activity   Alcohol use: Yes    Comment: occ   Drug use: No   Sexual activity: Not on file  Other Topics Concern   Not on file  Social History Narrative   Not on file   Social Determinants of Health   Financial Resource Strain: Not on file  Food Insecurity: Not on file  Transportation Needs: Not on file  Physical Activity: Not on file  Stress: Not on file  Social Connections: Not on file  Intimate Partner Violence: Not on file      FAMILY HISTORY:  Family History  Problem Relation Age of Onset   Diabetes Mother    Diabetes Father    Hypertension Father      REVIEW OF SYSTEMS:  Constitutional: denies weight loss, fever, chills, or sweats  Eyes: denies any other vision changes, history of eye injury  ENT: denies sore throat, hearing problems  Respiratory: denies shortness of breath, wheezing  Cardiovascular: denies chest pain, palpitations  Gastrointestinal: abdominal pain, nausea and vomiting Genitourinary: denies burning with urination or urinary frequency Musculoskeletal: denies any other joint pains or cramps.  Positive for neck pain Skin: denies any other rashes or skin discolorations   Neurological: denies any other headache, dizziness, weakness  Psychiatric: denies any other depression, anxiety   All other review of systems were negative   VITAL SIGNS:  Temp:  [97.8 F (36.6 C)-98.4 F (36.9 C)] 97.8 F (36.6 C) (07/07 1211) Pulse Rate:  [85-100] 89 (07/07 1211) Resp:  [16-18] 18 (07/07 1211) BP: (138-158)/(86-99) 158/94 (07/07 1211) SpO2:  [97 %-99 %] 97 % (07/07 1211) Weight:  [68 kg] 68 kg (07/07 0711)     Height: 5' (152.4 cm) Weight: 68 kg BMI (Calculated): 29.3   INTAKE/OUTPUT:  This shift: No intake/output data recorded.  Last 2 shifts: @IOLAST2SHIFTS @   PHYSICAL EXAM:  Constitutional:  -- Normal body habitus  -- Awake, alert, and oriented x3  Eyes:  -- Pupils equally round and reactive to light  -- No scleral icterus  Ear, nose, and throat:  -- No jugular venous distension  --Posterior neck swelling and tenderness on palpation.  There is mild erythema.  There is no obvious palpable fluctuant collection. Pulmonary:  -- No crackles  -- Equal breath sounds bilaterally -- Breathing non-labored at rest Cardiovascular:  -- S1, S2 present  -- No pericardial rubs Gastrointestinal:  -- Abdomen soft, nontender, non-distended, no guarding or rebound tenderness -- No abdominal masses appreciated, pulsatile or otherwise  Musculoskeletal and Integumentary:  -- Wounds: None appreciated -- Extremities: B/L UE and LE FROM, hands and feet warm, no edema  Neurologic:  -- Motor function: intact and symmetric -- Sensation: intact and symmetric   Labs:  CBC Latest Ref Rng & Units 12/27/2020 12/21/2020 12/24/2019  WBC 4.0 - 10.5 K/uL 13.3(H) 9.9 16.5(H)  Hemoglobin 12.0 - 15.0 g/dL 13.6 14.5 11.3(L)  Hematocrit 36.0 - 46.0 % 42.3 43.5 35.8(L)  Platelets 150 - 400 K/uL 249 233 385   CMP Latest Ref Rng & Units 12/27/2020 12/21/2020 12/24/2019  Glucose 70 - 99 mg/dL 211(H) 320(H) 333(H)  BUN 6 - 20 mg/dL 19 27(H) 23(H)  Creatinine 0.44 - 1.00 mg/dL 0.72 1.06(H) 0.95   Sodium 135 - 145 mmol/L 132(L) 138 132(L)  Potassium 3.5 - 5.1 mmol/L 4.0 4.1 4.4  Chloride 98 - 111 mmol/L 97(L) 106 95(L)  CO2  22 - 32 mmol/L 24 22 23   Calcium 8.9 - 10.3 mg/dL 8.9 9.4 9.1  Total Protein 6.5 - 8.1 g/dL 7.1 8.0 8.3(H)  Total Bilirubin 0.3 - 1.2 mg/dL 0.8 0.8 0.5  Alkaline Phos 38 - 126 U/L 66 71 97  AST 15 - 41 U/L 24 32 20  ALT 0 - 44 U/L 23 23 16      Imaging studies:  EXAM: CT NECK WITH CONTRAST   TECHNIQUE: Multidetector CT imaging of the neck was performed using the standard protocol following the bolus administration of intravenous contrast.   CONTRAST:  36mL OMNIPAQUE IOHEXOL 300 MG/ML  SOLN   COMPARISON:  None.   FINDINGS: Pharynx and larynx: No evidence of mass or swelling. Patent airway. Mild right-sided retropharyngeal edema.   Salivary glands: Bilateral submandibular gland atrophy. No salivary mass, stone, or acute inflammation identified.   Thyroid: Unremarkable.   Lymph nodes: No enlarged or suspicious lymph nodes in the neck.   Vascular: Major vascular structures of the neck are grossly patent.   Limited intracranial: More fully evaluated on today's head CT.   Visualized orbits: Bilateral proptosis. No orbital mass or inflammation.   Mastoids and visualized paranasal sinuses: Mild mucosal thickening in the left maxillary sinus. Clear mastoid air cells.   Skeleton: No acute osseous abnormality or suspicious osseous lesion. Mild cervical spondylosis.   Upper chest: Clear lung apices.   Other: A rim enhancing low-density collection in the right suboccipital soft tissues measures 2.9 x 2.3 x 3.3 cm. There is extensive surrounding inflammation involving the right-sided posterior neck musculature and overlying subcutaneous fat with swelling extending laterally and inferiorly in the neck. There is also milder inflammation more anteriorly in the neck on the right including in the right carotid space.   IMPRESSION: 3 cm right  suboccipital abscess with extensive surrounding inflammation.     Electronically Signed   By: Logan Bores M.D.   On: 12/27/2020 10:05    Assessment/Plan:  50 y.o. female with posterior neck abscess, complicated by pertinent comorbidities including diabetes mellitus, GERD, rheumatoid arthritis.  Patient without the abscess of the posterior neck.  This is not easily accessible surgically.  Due to the localized and contained fluid collection with the remains I think that is reasonable to try avoid percutaneous aspiration.  If patient respond to antibiotic therapy then an aspiration we will be able to avoid an invasive surgical management.  I discussed the case with interventional radiologist and he agreed that he will be able to aspirate the fluid.  I will continue to follow after procedure.  Agree with antibiotic therapy.  Medical management of comorbidities such as uncontrolled diabetes and hypertension as per hospitalist.   Arnold Long, MD

## 2020-12-28 ENCOUNTER — Observation Stay: Payer: BC Managed Care – PPO

## 2020-12-28 DIAGNOSIS — L0211 Cutaneous abscess of neck: Principal | ICD-10-CM

## 2020-12-28 DIAGNOSIS — E1165 Type 2 diabetes mellitus with hyperglycemia: Secondary | ICD-10-CM | POA: Diagnosis present

## 2020-12-28 DIAGNOSIS — Z79899 Other long term (current) drug therapy: Secondary | ICD-10-CM | POA: Diagnosis not present

## 2020-12-28 DIAGNOSIS — M1990 Unspecified osteoarthritis, unspecified site: Secondary | ICD-10-CM | POA: Diagnosis present

## 2020-12-28 DIAGNOSIS — K219 Gastro-esophageal reflux disease without esophagitis: Secondary | ICD-10-CM | POA: Diagnosis present

## 2020-12-28 DIAGNOSIS — L03811 Cellulitis of head [any part, except face]: Secondary | ICD-10-CM | POA: Diagnosis present

## 2020-12-28 DIAGNOSIS — I110 Hypertensive heart disease with heart failure: Secondary | ICD-10-CM | POA: Diagnosis present

## 2020-12-28 DIAGNOSIS — F17210 Nicotine dependence, cigarettes, uncomplicated: Secondary | ICD-10-CM | POA: Diagnosis present

## 2020-12-28 DIAGNOSIS — Z7984 Long term (current) use of oral hypoglycemic drugs: Secondary | ICD-10-CM | POA: Diagnosis not present

## 2020-12-28 DIAGNOSIS — Z635 Disruption of family by separation and divorce: Secondary | ICD-10-CM | POA: Diagnosis not present

## 2020-12-28 DIAGNOSIS — Z96643 Presence of artificial hip joint, bilateral: Secondary | ICD-10-CM | POA: Diagnosis present

## 2020-12-28 DIAGNOSIS — L309 Dermatitis, unspecified: Secondary | ICD-10-CM

## 2020-12-28 DIAGNOSIS — K863 Pseudocyst of pancreas: Secondary | ICD-10-CM | POA: Diagnosis present

## 2020-12-28 DIAGNOSIS — Z716 Tobacco abuse counseling: Secondary | ICD-10-CM | POA: Diagnosis not present

## 2020-12-28 DIAGNOSIS — R519 Headache, unspecified: Secondary | ICD-10-CM | POA: Diagnosis present

## 2020-12-28 DIAGNOSIS — Z833 Family history of diabetes mellitus: Secondary | ICD-10-CM | POA: Diagnosis not present

## 2020-12-28 DIAGNOSIS — E1151 Type 2 diabetes mellitus with diabetic peripheral angiopathy without gangrene: Secondary | ICD-10-CM | POA: Diagnosis present

## 2020-12-28 DIAGNOSIS — E785 Hyperlipidemia, unspecified: Secondary | ICD-10-CM | POA: Diagnosis present

## 2020-12-28 DIAGNOSIS — Z8673 Personal history of transient ischemic attack (TIA), and cerebral infarction without residual deficits: Secondary | ICD-10-CM | POA: Diagnosis not present

## 2020-12-28 DIAGNOSIS — L0291 Cutaneous abscess, unspecified: Secondary | ICD-10-CM | POA: Diagnosis present

## 2020-12-28 DIAGNOSIS — M0579 Rheumatoid arthritis with rheumatoid factor of multiple sites without organ or systems involvement: Secondary | ICD-10-CM

## 2020-12-28 DIAGNOSIS — I5022 Chronic systolic (congestive) heart failure: Secondary | ICD-10-CM | POA: Diagnosis present

## 2020-12-28 DIAGNOSIS — Z888 Allergy status to other drugs, medicaments and biological substances status: Secondary | ICD-10-CM | POA: Diagnosis not present

## 2020-12-28 DIAGNOSIS — Z20822 Contact with and (suspected) exposure to covid-19: Secondary | ICD-10-CM | POA: Diagnosis present

## 2020-12-28 DIAGNOSIS — Z975 Presence of (intrauterine) contraceptive device: Secondary | ICD-10-CM | POA: Diagnosis not present

## 2020-12-28 DIAGNOSIS — L03221 Cellulitis of neck: Secondary | ICD-10-CM

## 2020-12-28 DIAGNOSIS — F329 Major depressive disorder, single episode, unspecified: Secondary | ICD-10-CM | POA: Diagnosis present

## 2020-12-28 LAB — BASIC METABOLIC PANEL
Anion gap: 4 — ABNORMAL LOW (ref 5–15)
BUN: 14 mg/dL (ref 6–20)
CO2: 25 mmol/L (ref 22–32)
Calcium: 8.2 mg/dL — ABNORMAL LOW (ref 8.9–10.3)
Chloride: 106 mmol/L (ref 98–111)
Creatinine, Ser: 0.49 mg/dL (ref 0.44–1.00)
GFR, Estimated: >60 mL/min (ref >60)
Glucose, Bld: 148 mg/dL — ABNORMAL HIGH (ref 70–99)
Potassium: 4.1 mmol/L (ref 3.5–5.1)
Sodium: 135 mmol/L (ref 135–145)

## 2020-12-28 LAB — CBC
HCT: 34.9 % — ABNORMAL LOW (ref 36.0–46.0)
Hemoglobin: 11.4 g/dL — ABNORMAL LOW (ref 12.0–15.0)
MCH: 30.6 pg (ref 26.0–34.0)
MCHC: 32.7 g/dL (ref 30.0–36.0)
MCV: 93.8 fL (ref 80.0–100.0)
Platelets: 214 10*3/uL (ref 150–400)
RBC: 3.72 MIL/uL — ABNORMAL LOW (ref 3.87–5.11)
RDW: 13.4 % (ref 11.5–15.5)
WBC: 10.3 10*3/uL (ref 4.0–10.5)
nRBC: 0 % (ref 0.0–0.2)

## 2020-12-28 LAB — GLUCOSE, CAPILLARY
Glucose-Capillary: 136 mg/dL — ABNORMAL HIGH (ref 70–99)
Glucose-Capillary: 145 mg/dL — ABNORMAL HIGH (ref 70–99)
Glucose-Capillary: 150 mg/dL — ABNORMAL HIGH (ref 70–99)
Glucose-Capillary: 163 mg/dL — ABNORMAL HIGH (ref 70–99)
Glucose-Capillary: 191 mg/dL — ABNORMAL HIGH (ref 70–99)
Glucose-Capillary: 193 mg/dL — ABNORMAL HIGH (ref 70–99)

## 2020-12-28 LAB — MRSA NEXT GEN BY PCR, NASAL: MRSA by PCR Next Gen: NOT DETECTED

## 2020-12-28 LAB — HIV ANTIBODY (ROUTINE TESTING W REFLEX): HIV Screen 4th Generation wRfx: NONREACTIVE

## 2020-12-28 MED ORDER — METRONIDAZOLE 500 MG/100ML IV SOLN
500.0000 mg | Freq: Three times a day (TID) | INTRAVENOUS | Status: DC
  Filled 2020-12-28 (×3): qty 100

## 2020-12-28 MED ORDER — MIDAZOLAM HCL 2 MG/2ML IJ SOLN
INTRAMUSCULAR | Status: AC
  Filled 2020-12-28: qty 2

## 2020-12-28 MED ORDER — VANCOMYCIN HCL 1500 MG/300ML IV SOLN
1500.0000 mg | INTRAVENOUS | Status: AC
  Administered 2020-12-28: 1500 mg via INTRAVENOUS
  Filled 2020-12-28: qty 300

## 2020-12-28 MED ORDER — MIDAZOLAM HCL 2 MG/2ML IJ SOLN
INTRAMUSCULAR | Status: AC | PRN
  Administered 2020-12-28: 1 mg via INTRAVENOUS
  Administered 2020-12-28 (×2): 0.5 mg via INTRAVENOUS

## 2020-12-28 MED ORDER — SODIUM CHLORIDE 0.9 % IV SOLN
1.0000 g | INTRAVENOUS | Status: DC
  Filled 2020-12-28: qty 10

## 2020-12-28 MED ORDER — HYDROMORPHONE HCL 1 MG/ML IJ SOLN
0.5000 mg | INTRAMUSCULAR | Status: DC | PRN
Start: 2020-12-28 — End: 2020-12-30
  Administered 2020-12-28 – 2020-12-29 (×3): 0.5 mg via INTRAVENOUS
  Filled 2020-12-28 (×5): qty 1

## 2020-12-28 MED ORDER — BUPROPION HCL ER (XL) 150 MG PO TB24
150.0000 mg | ORAL_TABLET | Freq: Two times a day (BID) | ORAL | Status: DC
  Administered 2020-12-29 – 2020-12-30 (×3): 150 mg via ORAL
  Filled 2020-12-28 (×3): qty 1

## 2020-12-28 MED ORDER — SODIUM CHLORIDE 0.9 % IV SOLN
3.0000 g | Freq: Four times a day (QID) | INTRAVENOUS | Status: DC
  Administered 2020-12-28 – 2020-12-30 (×6): 3 g via INTRAVENOUS
  Filled 2020-12-28: qty 3
  Filled 2020-12-28: qty 8
  Filled 2020-12-28: qty 3
  Filled 2020-12-28 (×2): qty 8
  Filled 2020-12-28 (×2): qty 3
  Filled 2020-12-28: qty 8
  Filled 2020-12-28: qty 3
  Filled 2020-12-28: qty 8

## 2020-12-28 MED ORDER — VANCOMYCIN HCL 1250 MG/250ML IV SOLN
1250.0000 mg | INTRAVENOUS | Status: DC
  Administered 2020-12-29: 1250 mg via INTRAVENOUS
  Filled 2020-12-28 (×2): qty 250

## 2020-12-28 MED ORDER — FENTANYL CITRATE (PF) 100 MCG/2ML IJ SOLN
INTRAMUSCULAR | Status: AC | PRN
  Administered 2020-12-28 (×2): 25 ug via INTRAVENOUS
  Administered 2020-12-28: 50 ug via INTRAVENOUS

## 2020-12-28 MED ORDER — ALPRAZOLAM 0.5 MG PO TABS
0.5000 mg | ORAL_TABLET | Freq: Three times a day (TID) | ORAL | Status: DC | PRN
  Administered 2020-12-28: 16:00:00 0.5 mg via ORAL
  Filled 2020-12-28: qty 1

## 2020-12-28 MED ORDER — FENTANYL CITRATE (PF) 100 MCG/2ML IJ SOLN
INTRAMUSCULAR | Status: AC
  Filled 2020-12-28: qty 2

## 2020-12-28 NOTE — Progress Notes (Signed)
Patient ID: April Chaney, female   DOB: Apr 25, 1971, 50 y.o.   MRN: [id number]     [place] [facility] Day(s): 0.   Interval History: Patient seen and examined, no acute events or new complaints overnight. Patient reports continued having pain on the posterior scalp.  Pain does not radiate to other part of body.  There has been no alleviating or rating factor.  Patient had attempt aspiration of the abscess but was not able to completely resolve.  Recommendation was to proceed with surgical drainage.  Vital signs in last 24 hours: [min-max] current  Temp:  [98 F (36.7 C)-98.3 F (36.8 C)] 98.1 F (36.7 C) (07/08 1700) Pulse Rate:  [76-106] 76 (07/08 1700) Resp:  [10-25] 12 (07/08 1505) BP: (137-189)/(73-112) 137/73 (07/08 1700) SpO2:  [96 %-100 %] 98 % (07/08 1505)     Height: 5' (152.4 cm) Weight: 68 kg BMI (Calculated): 29.3   Physical Exam:  Constitutional: alert, cooperative and no distress  Head: Posterior scalp right area of induration and swelling.  Tender to palpation.  Labs:  CBC Latest Ref Rng & Units 12/28/2020 12/27/2020 12/21/2020  WBC 4.0 - 10.5 K/uL 10.3 13.3(H) 9.9  Hemoglobin 12.0 - 15.0 g/dL 11.4(L) 13.6 14.5  Hematocrit 36.0 - 46.0 % 34.9(L) 42.3 43.5  Platelets 150 - 400 K/uL 214 249 233   CMP Latest Ref Rng & Units 12/28/2020 12/27/2020 12/21/2020  Glucose 70 - 99 mg/dL 148(H) 211(H) 320(H)  BUN 6 - 20 mg/dL 14 19 27(H)  Creatinine 0.44 - 1.00 mg/dL 0.49 0.72 1.06(H)  Sodium 135 - 145 mmol/L 135 132(L) 138  Potassium 3.5 - 5.1 mmol/L 4.1 4.0 4.1  Chloride 98 - 111 mmol/L 106 97(L) 106  CO2 22 - 32 mmol/L 25 24 22   Calcium 8.9 - 10.3 mg/dL 8.2(L) 8.9 9.4  Total Protein 6.5 - 8.1 g/dL - 7.1 8.0  Total Bilirubin 0.3 - 1.2 mg/dL - 0.8 0.8  Alkaline Phos 38 - 126 U/L - 66 71  AST 15 - 41 U/L - 24 32  ALT 0 - 44 U/L - 23 23    Imaging studies: I personally evaluated the images of the aspiration of the abscess.  Still residual fluid collection despite  adequate aspiration.   Assessment/Plan:  50 y.o. female with posterior neck abscess, complicated by pertinent comorbidities including diabetes mellitus, GERD, rheumatoid arthritis.  Attempted to aspirate abscess percutaneously today was not completely successful.  10 cc were aspirated but there is still fluid collection that was cystic to aspirate.  I discussed with the patient about surgical drainage.  Discussed with patient about risks and benefits.  Patient agreed to proceed.  We will take it to the operating room tomorrow for surgical drainage.  Arnold Long, MD

## 2020-12-28 NOTE — Progress Notes (Signed)
Report given to [place] on 1C at bedside with update given.

## 2020-12-28 NOTE — Progress Notes (Signed)
Patient clinically stable post Abscess aspiration of 10 ml dk reddish brown thick purulent drainage removal/sent to lab. Tolerated well. Vitals stable pre and post procedure. Finishing recovery in [place] dept prior to transporting back to room.

## 2020-12-28 NOTE — Consult Note (Signed)
NAME: April Chaney  DOB: 12/21/1970  MRN: [id number]  Date/Time: 12/28/2020 6:40 PM  REQUESTING PROVIDER: Dr.Masoud Subjective:  REASON FOR CONSULT: abscess rt side of the scalp ? April Chaney is a 50 y.o. female with a history of rheumatoid arthritis, DM, Htn , B/L hip replacement presented to the ED with a painful swelling behind the rt ear and headaches PT says she has had a knot behind the rt ear 3 weeks ago and went to chiropractor thinking it was a neck problem. It did not help- then 3 days ago she started having pain and headaches. Went to [facility] on 12/26/20 and was given Doxycycline. Yesterday she went to work - she is a Counsellor for [employer]- As the pain was severe her friend got het to the ED. She has no fever or chills, no discharge Pt says she has had a skin condition for the past few months to a year and has seen three different dermatologist and has been told it is either eczema or psoriasis She has Rheumatoid arthritis and takes sarilumab ( IL6 inhibitor) injection every 2 weeks for the past few months She used to be on methotrexate before that.  Pt fell in her bathroom and hurt her left side of the chest wall, arm and upper abdomen  on 7/1 and was in the ED and CT chest showed age indeterminate 4th and 5th lateral rib fractures on the left side  She ahs had alcoholic pancreatitis in the past She drinks rum and coke but does not consider herself drinking heavily. She is a current smoker   Past Medical History:  Diagnosis Date   Arthritis    Colon polyps    Complication of anesthesia    woke up during 1 colonscopy 3 yrs ago   Diabetes mellitus without complication ([place])    type 2   DM (diabetes mellitus) ([place])    Family history of adverse reaction to anesthesia    mother slow to awaken after 1 procedure   GERD (gastroesophageal reflux disease)    Hypertension    hx of no bp meds for last 11 years after weight loss   Pancreatitis 3-4 yrs ago and feb 2018    Presence of artificial hip    has to take antibiotics prior to any procedure-dental, etc   Ulcer     Past Surgical History:  Procedure Laterality Date   COLONOSCOPY WITH PROPOFOL N/A 02/11/2019   Procedure: COLONOSCOPY WITH PROPOFOL;  Surgeon: Lollie Sails, MD;  Location: [facility] ENDOSCOPY;  Service: Endoscopy;  Laterality: N/A;   colonscopy     x 4   ESOPHAGOGASTRODUODENOSCOPY (EGD) WITH PROPOFOL N/A 02/11/2019   Procedure: ESOPHAGOGASTRODUODENOSCOPY (EGD) WITH PROPOFOL;  Surgeon: Lollie Sails, MD;  Location: [facility] ENDOSCOPY;  Service: Endoscopy;  Laterality: N/A;   EUS N/A 01/01/2017   Procedure: UPPER ENDOSCOPIC ULTRASOUND (EUS) LINEAR;  Surgeon: Milus Banister, MD;  Location: [facility] ENDOSCOPY;  Service: Endoscopy;  Laterality: N/A;   FINE NEEDLE ASPIRATION  01/01/2017   Procedure: FINE NEEDLE ASPIRATION;  Surgeon: Milus Banister, MD;  Location: [facility] ENDOSCOPY;  Service: Endoscopy;;   JOINT REPLACEMENT     hip replacement   LEFT HEART CATH AND CORONARY ANGIOGRAPHY N/A 04/26/2019   Procedure: LEFT HEART CATH AND CORONARY ANGIOGRAPHY;  Surgeon: Isaias Cowman, MD;  Location: [facility];  Service: Cardiovascular;  Laterality: N/A;   STRABISMUS SURGERY  12/12/2011   Procedure: REPAIR STRABISMUS;  Surgeon: Derry Skill, MD;  Location: [facility];  Service: Ophthalmology;  Laterality: Right;   TOTAL HIP ARTHROPLASTY     bilat    Social History   Socioeconomic History   Marital status: Legally Separated    Spouse name: Not on file   Number of children: Not on file   Years of education: Not on file   Highest education level: Not on file  Occupational History   Not on file  Tobacco Use   Smoking status: Some Days    Packs/day: 0.50    Pack years: 0.00    Types: Cigarettes   Smokeless tobacco: Never  Vaping Use   Vaping Use: Never used  Substance and Sexual Activity   Alcohol use: Yes    Comment: occ   Drug use: No   Sexual activity: Not on  file  Other Topics Concern   Not on file  Social History Narrative   Not on file   Social Determinants of Health   Financial Resource Strain: Not on file  Food Insecurity: Not on file  Transportation Needs: Not on file  Physical Activity: Not on file  Stress: Not on file  Social Connections: Not on file  Intimate Partner Violence: Not on file    Family History  Problem Relation Age of Onset   Diabetes Mother    Diabetes Father    Hypertension Father    Allergies  Allergen Reactions   Pantoprazole Rash   I? Current Facility-Administered Medications  Medication Dose Route Frequency Provider Last Rate Last Admin   0.9 %  sodium chloride infusion   Intravenous Continuous Agbata, Tochukwu, MD   Stopped at 12/28/20 1310   acetaminophen (TYLENOL) tablet 650 mg  650 mg Oral Q6H PRN Agbata, Tochukwu, MD       Or   acetaminophen (TYLENOL) suppository 650 mg  650 mg Rectal Q6H PRN Agbata, Tochukwu, MD       albuterol (PROVENTIL) (2.5 MG/3ML) 0.083% nebulizer solution 2.5 mg  2.5 mg Nebulization Q6H PRN Beers, Shanon Brow, RPH       ALPRAZolam Duanne Moron) tablet 0.5 mg  0.5 mg Oral TID PRN George Hugh, MD   0.5 mg at 12/28/20 1544   atorvastatin (LIPITOR) tablet 40 mg  40 mg Oral Q1200 Agbata, Tochukwu, MD       [START ON 12/29/2020] buPROPion (WELLBUTRIN XL) 24 hr tablet 150 mg  150 mg Oral BID George Hugh, MD       calcium-vitamin D (OSCAL WITH D) 500-200 MG-UNIT per tablet 2 tablet  2 tablet Oral Q breakfast Agbata, Tochukwu, MD   2 tablet at 12/28/20 1702   cholecalciferol (VITAMIN D3) tablet 2,000 Units  2,000 Units Oral Daily Agbata, Tochukwu, MD   2,000 Units at 12/28/20 1702   fentaNYL (SUBLIMAZE) 100 MCG/2ML injection            folic acid (FOLVITE) tablet 3 mg  3 mg Oral Daily Agbata, Tochukwu, MD   3 mg at 12/28/20 1702   HYDROcodone-acetaminophen (NORCO/VICODIN) 5-325 MG per tablet 2 tablet  2 tablet Oral Q6H PRN Agbata, Tochukwu, MD   2 tablet at 12/28/20 1544   HYDROmorphone  (DILAUDID) injection 0.5 mg  0.5 mg Intravenous Q2H PRN George Hugh, MD   0.5 mg at 12/28/20 1204   insulin aspart (novoLOG) injection 0-15 Units  0-15 Units Subcutaneous Q4H Lucrezia Starch, MD   2 Units at 12/28/20 1703   lisinopril (ZESTRIL) tablet 10 mg  10 mg Oral Daily Agbata, Tochukwu, MD   10 mg at  12/28/20 0913   midazolam (VERSED) 2 MG/2ML injection            multivitamin with minerals tablet 1 tablet  1 tablet Oral Daily Agbata, Tochukwu, MD   1 tablet at 12/28/20 1702   omeprazole (PRILOSEC) capsule 40 mg  40 mg Oral Daily Agbata, Tochukwu, MD   40 mg at 12/27/20 2120   ondansetron (ZOFRAN) tablet 4 mg  4 mg Oral Q6H PRN Agbata, Tochukwu, MD       Or   ondansetron (ZOFRAN) injection 4 mg  4 mg Intravenous Q6H PRN Agbata, Tochukwu, MD       [START ON 12/29/2020] vancomycin (VANCOREADY) IVPB 1250 mg/250 mL  1,250 mg Intravenous Q24H Vira Blanco, RPH       vancomycin (VANCOREADY) IVPB 1500 mg/300 mL  1,500 mg Intravenous NOW Vira Blanco, RPH 150 mL/hr at 12/28/20 1720 1,500 mg at 12/28/20 1720   zolpidem (AMBIEN) tablet 5 mg  5 mg Oral QHS PRN Athena Masse, MD   5 mg at 12/27/20 2120     Abtx:  Anti-infectives (From admission, onward)    Start     Dose/Rate Route Frequency Ordered Stop   12/29/20 1800  vancomycin (VANCOREADY) IVPB 1250 mg/250 mL        1,250 mg 166.7 mL/hr over 90 Minutes Intravenous Every 24 hours 12/28/20 1423     12/28/20 2000  vancomycin (VANCOREADY) IVPB 1250 mg/250 mL  Status:  Discontinued        1,250 mg 166.7 mL/hr over 90 Minutes Intravenous Every 24 hours 12/27/20 1858 12/28/20 1423   12/28/20 1700  metroNIDAZOLE (FLAGYL) IVPB 500 mg  Status:  Discontinued        500 mg 100 mL/hr over 60 Minutes Intravenous Every 8 hours 12/28/20 1609 12/28/20 1619   12/28/20 1645  cefTRIAXone (ROCEPHIN) 1 g in sodium chloride 0.9 % 100 mL IVPB  Status:  Discontinued        1 g 200 mL/hr over 30 Minutes Intravenous Every 24 hours 12/28/20 1549 12/28/20  1619   12/28/20 1515  vancomycin (VANCOREADY) IVPB 1500 mg/300 mL        1,500 mg 150 mL/hr over 120 Minutes Intravenous NOW 12/28/20 1423 12/29/20 1515   12/28/20 1400  vancomycin (VANCOREADY) IVPB 1250 mg/250 mL  Status:  Discontinued        1,250 mg 166.7 mL/hr over 90 Minutes Intravenous Every 24 hours 12/27/20 1405 12/27/20 1858   12/27/20 2200  hydroxychloroquine (PLAQUENIL) tablet 200 mg  Status:  Discontinued        200 mg Oral 2 times daily 12/27/20 1309 12/28/20 1612   12/27/20 1400  vancomycin (VANCOREADY) IVPB 1500 mg/300 mL  Status:  Discontinued        1,500 mg 150 mL/hr over 120 Minutes Intravenous  Once 12/27/20 1329 12/28/20 1423   12/27/20 1315  vancomycin (VANCOCIN) IVPB 1000 mg/200 mL premix  Status:  Discontinued        1,000 mg 200 mL/hr over 60 Minutes Intravenous  Once 12/27/20 1309 12/27/20 1329   12/27/20 1045  cefTRIAXone (ROCEPHIN) 1 g in sodium chloride 0.9 % 100 mL IVPB        1 g 200 mL/hr over 30 Minutes Intravenous  Once 12/27/20 1044 12/27/20 1206       REVIEW OF SYSTEMS:  Const: negative fever, negative chills, negative weight loss Eyes: negative diplopia or visual changes, negative eye pain ENT: negative coryza, negative sore throat Resp: negative cough,  hemoptysis, dyspnea Cards: left sided  chest pain, no palpitations, no lower extremity edema GU: negative for frequency, dysuria and hematuria GI: Negative for abdominal pain, diarrhea, bleeding, constipation Skin:  rash and pruritus Heme:bruising over left arm MS: pain left side of the chest  Neurolo:headache on the rt side Psych:  anxiety, depression  Endocrine: poorly controlled, diabetes Allergy/Immunology- pantoprazole Objective:  VITALS:  BP 137/73   Pulse 76   Temp 98.1 F (36.7 C) (Oral)   Resp 12   Ht 5' (1.524 m)   Wt 68 kg   SpO2 98%   BMI 29.29 kg/m  PHYSICAL EXAM:  General: sleepy  because of pain meds-was having dinner , ansered questions appropriately but a little  restless  cooperative, chronically ill Head: scaly  rash over scalp, ears, around eyes Erythema and induration over the scalp behind the rt ear    Eyes: Conjunctivae clear, anicteric sclerae. Pupils are equal, scaly rash around eyes ENT Nares normal. No drainage or sinus tenderness. Lips, mucosa, and tongue normal. No Thrush Neck: Supple, symmetrical, no adenopathy, thyroid: non tender no carotid bruit and no JVD. Back: No CVA tenderness. Lungs: Clear to auscultation bilaterally. No Wheezing or Rhonchi. No rales. Heart: Regular rate and rhythm, no murmur, rub or gallop. Abdomen: Soft, non-tender,not distended. Bowel sounds normal. No masses Extremities: ecchymosis left arm Scaly lesions scattered over  Left shin has an erythematous macular lesion  hands have scaly lesions     Skin: as above Lymph: Cervical, supraclavicular normal. Neurologic: Grossly non-focal Pertinent Labs Lab Results CBC    Component Value Date/Time   WBC 10.3 12/28/2020 0405   RBC 3.72 (L) 12/28/2020 0405   HGB 11.4 (L) 12/28/2020 0405   HCT 34.9 (L) 12/28/2020 0405   PLT 214 12/28/2020 0405   MCV 93.8 12/28/2020 0405   MCH 30.6 12/28/2020 0405   MCHC 32.7 12/28/2020 0405   RDW 13.4 12/28/2020 0405   LYMPHSABS 1.3 12/27/2020 0816   MONOABS 0.9 12/27/2020 0816   EOSABS 0.4 12/27/2020 0816   BASOSABS 0.1 12/27/2020 0816    CMP Latest Ref Rng & Units 12/28/2020 12/27/2020 12/21/2020  Glucose 70 - 99 mg/dL 148(H) 211(H) 320(H)  BUN 6 - 20 mg/dL 14 19 27(H)  Creatinine 0.44 - 1.00 mg/dL 0.49 0.72 1.06(H)  Sodium 135 - 145 mmol/L 135 132(L) 138  Potassium 3.5 - 5.1 mmol/L 4.1 4.0 4.1  Chloride 98 - 111 mmol/L 106 97(L) 106  CO2 22 - 32 mmol/L 25 24 22   Calcium 8.9 - 10.3 mg/dL 8.2(L) 8.9 9.4  Total Protein 6.5 - 8.1 g/dL - 7.1 8.0  Total Bilirubin 0.3 - 1.2 mg/dL - 0.8 0.8  Alkaline Phos 38 - 126 U/L - 66 71  AST 15 - 41 U/L - 24 32  ALT 0 - 44 U/L - 23 23      Microbiology: Recent Results  (from the past 240 hour(s))  Blood culture (single)     Status: None (Preliminary result)   Collection Time: 12/27/20  8:16 AM   Specimen: BLOOD  Result Value Ref Range Status   Specimen Description BLOOD  LEFT FOREARM  Final   Special Requests   Final    BOTTLES DRAWN AEROBIC AND ANAEROBIC Blood Culture adequate volume   Culture   Final    NO GROWTH < 24 HOURS Performed at [facility], [address] [place], [place] [address]    Report Status PENDING  Incomplete  Resp Panel by RT-PCR (Flu A&B,  Covid) Nasopharyngeal Swab     Status: None   Collection Time: 12/27/20 11:26 AM   Specimen: Nasopharyngeal Swab; Nasopharyngeal(NP) swabs in vial transport medium  Result Value Ref Range Status   SARS Coronavirus 2 by RT PCR NEGATIVE NEGATIVE Final    Comment: (NOTE) SARS-CoV-2 target nucleic acids are NOT DETECTED.  The SARS-CoV-2 RNA is generally detectable in upper respiratory specimens during the acute phase of infection. The lowest concentration of SARS-CoV-2 viral copies this assay can detect is 138 copies/mL. A negative result does not preclude SARS-Cov-2 infection and should not be used as the sole basis for treatment or other patient management decisions. A negative result may occur with  improper specimen collection/handling, submission of specimen other than nasopharyngeal swab, presence of viral mutation(s) within the areas targeted by this assay, and inadequate number of viral copies(<138 copies/mL). A negative result must be combined with clinical observations, patient history, and epidemiological information. The expected result is Negative.  Fact Sheet for Patients:  EntrepreneurPulse.com.au  Fact Sheet for Healthcare Providers:  IncredibleEmployment.be  This test is no t yet approved or cleared by the [place] FDA and  has been authorized for detection and/or diagnosis of SARS-CoV-2 by FDA under an Emergency Use  Authorization (EUA). This EUA will remain  in effect (meaning this test can be used) for the duration of the COVID-19 declaration under Section 564(b)(1) of the Act, 21 U.S.C.section 360bbb-3(b)(1), unless the authorization is terminated  or revoked sooner.       Influenza A by PCR NEGATIVE NEGATIVE Final   Influenza B by PCR NEGATIVE NEGATIVE Final    Comment: (NOTE) The Xpert Xpress SARS-CoV-2/FLU/RSV plus assay is intended as an aid in the diagnosis of influenza from Nasopharyngeal swab specimens and should not be used as a sole basis for treatment. Nasal washings and aspirates are unacceptable for Xpert Xpress SARS-CoV-2/FLU/RSV testing.  Fact Sheet for Patients: EntrepreneurPulse.com.au  Fact Sheet for Healthcare Providers: IncredibleEmployment.be  This test is not yet approved or cleared by the [place] FDA and has been authorized for detection and/or diagnosis of SARS-CoV-2 by FDA under an Emergency Use Authorization (EUA). This EUA will remain in effect (meaning this test can be used) for the duration of the COVID-19 declaration under Section 564(b)(1) of the Act, 21 U.S.C. section 360bbb-3(b)(1), unless the authorization is terminated or revoked.  Performed at [facility], [address] [place], [place] [address]     IMAGING RESULTS:  I have personally reviewed the films ?A rim enhancing low-density collection in the right suboccipital soft tissues measures 2.9 x 2.3 x 3.3 cm. There is extensive surrounding inflammation involving the right-sided posterior neck musculature and overlying subcutaneous fat with swelling extending laterally and inferiorly in the neck. There is also milder inflammation more anteriorly in the neck on the right including in the right carotid space.  Impression/Recommendation ?50 yr female with h/o Rheumatoid arthritis Eczema/psoriasis, DM presents with 3 week h/o of a swelling behind  the rt ear with pain headache of 3 days duration  Abscess with surrounding cellulitis of the scalp behind the rt ear. With eczema, DM, staph aureus is likely organism. Carbuncle is possible but there is no discharge IR tried to aspirate byt couldn't Currently on vanco- no need for gram neg coverage with ceftriaxone Would do unasyn to cover anerobes and MSSA  The inflammation is reported as mildly extending in the carotid sheath There is no IJ involvement to suggest Lemierre's  Culture form behind the ear  and left shin taken   Rheumatoid arthritis on IL6 inhibitor which puts her at risk for infections. Hold while in the hospital   DM- is currently on insulin Was taking metformin at home  Depression  Possibility of excess alcohol use  Recent fall and fracture left ribs 4/5  Discussed with patient, her nurse and requesting provider Note:  This document was prepared using Dragon voice recognition software and may include unintentional dictation errors.

## 2020-12-28 NOTE — H&P (View-Only) (Signed)
Patient ID: April Chaney, female   DOB: 1970/09/06, 50 y.o.   MRN: [id number]     [facility] Day(s): 0.   Interval History: Patient seen and examined, no acute events or new complaints overnight. Patient reports continued having pain on the posterior scalp.  Pain does not radiate to other part of body.  There has been no alleviating or rating factor.  Patient had attempt aspiration of the abscess but was not able to completely resolve.  Recommendation was to proceed with surgical drainage.  Vital signs in last 24 hours: [min-max] current  Temp:  [98 F (36.7 C)-98.3 F (36.8 C)] 98.1 F (36.7 C) (07/08 1700) Pulse Rate:  [76-106] 76 (07/08 1700) Resp:  [10-25] 12 (07/08 1505) BP: (137-189)/(73-112) 137/73 (07/08 1700) SpO2:  [96 %-100 %] 98 % (07/08 1505)     Height: 5' (152.4 cm) Weight: 68 kg BMI (Calculated): 29.3   Physical Exam:  Constitutional: alert, cooperative and no distress  Head: Posterior scalp right area of induration and swelling.  Tender to palpation.  Labs:  CBC Latest Ref Rng & Units 12/28/2020 12/27/2020 12/21/2020  WBC 4.0 - 10.5 K/uL 10.3 13.3(H) 9.9  Hemoglobin 12.0 - 15.0 g/dL 11.4(L) 13.6 14.5  Hematocrit 36.0 - 46.0 % 34.9(L) 42.3 43.5  Platelets 150 - 400 K/uL 214 249 233   CMP Latest Ref Rng & Units 12/28/2020 12/27/2020 12/21/2020  Glucose 70 - 99 mg/dL 148(H) 211(H) 320(H)  BUN 6 - 20 mg/dL 14 19 27(H)  Creatinine 0.44 - 1.00 mg/dL 0.49 0.72 1.06(H)  Sodium 135 - 145 mmol/L 135 132(L) 138  Potassium 3.5 - 5.1 mmol/L 4.1 4.0 4.1  Chloride 98 - 111 mmol/L 106 97(L) 106  CO2 22 - 32 mmol/L 25 24 22   Calcium 8.9 - 10.3 mg/dL 8.2(L) 8.9 9.4  Total Protein 6.5 - 8.1 g/dL - 7.1 8.0  Total Bilirubin 0.3 - 1.2 mg/dL - 0.8 0.8  Alkaline Phos 38 - 126 U/L - 66 71  AST 15 - 41 U/L - 24 32  ALT 0 - 44 U/L - 23 23    Imaging studies: I personally evaluated the images of the aspiration of the abscess.  Still residual fluid collection despite  adequate aspiration.   Assessment/Plan:  50 y.o. female with posterior neck abscess, complicated by pertinent comorbidities including diabetes mellitus, GERD, rheumatoid arthritis.  Attempted to aspirate abscess percutaneously today was not completely successful.  10 cc were aspirated but there is still fluid collection that was cystic to aspirate.  I discussed with the patient about surgical drainage.  Discussed with patient about risks and benefits.  Patient agreed to proceed.  We will take it to the operating room tomorrow for surgical drainage.  Arnold Long, MD

## 2020-12-28 NOTE — Progress Notes (Addendum)
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  CT shows 2.9 x 2.3 x 3.3 cn abscess in the right suboccipital area that extends into the carotid space.  Patient denies any trauma or surgeries or IV drug use.  IR attempted to drain abscess today but was unsuccessful.    Subjective: Patient endorses pain at abscess site. She has no fevers, sore throat, dysphagia, dyspnea, or signs of airway compromise. There is nuchal pain with motion limitation due to soft neck tissue swelling.  This is aggravated particularly with rotation and extension. She has difficulty opening mouth but has poor oral hygiene. Neurological exam is normal. There is a superficial lesion on the left shin -chronic.   Assessment & Plan: Principal Problem:   Abscess Active Problems:   Benign essential hypertension   GERD without esophagitis   Nicotine dependence   Rheumatoid arthritis involving multiple sites with positive rheumatoid factor (HCC)  3 cm Posterior Neck Abscess that extends into the right carotid sheath - possibly secondary to oral cavity infection in the setting of immunocompromised state: The cellulitis extends into the right carotid space. This is concerning for a deep space infection.   - IR attempted to perform percutaneous drainage today but was unsuccessful due to dense contents of the abscess, appreciate. - It is important to get abscess drained as rupture can have severe complications.  I will discuss plan with General Surgery, appreciate.  She may need the right deep neck explored at the level  of the carotid bifurcation, abscess drained, and catheter drains placed - will discuss. - Neck infections are polymicrobial but Staph and Strep are the most common organisms.  Anaerobic coverage would be warranted if source was the oral cavity.  I will discuss antibiotics with ID.  Ideally we would like to hold them prior to surgical drainage. - Consult Infectious Disease, appreciate. - Check HIV screen, Immunoglobulin levels, ESR, CRP, CK, and Lactic Acid. - Hold HQ for now as infection is complicated.  Occipital Throbbing Headache: - Oxycodone for long acting control and Dilaudid PRN for breakthrough pain.  Rheumatoid Arthritis: - Hold HQ temporarily. Restart once we have source control.  History of Systolic Heart Failure with EF 35-40%: - Continue fluids for now. - Check Echo. - Start GDMT if EF remains low.  Major Depressive Disorder: - Decrease Bupropion to 150 mg BID.  Confirm med rec prior to starting psych meds.  History of Pancreatitis / Alcohol Use: - Abdomen is benign.  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  Diet Order             Diet Carb Modified Fluid consistency: Thin; Room service appropriate? Yes  Diet effective now                         Patient's Body mass index is 29.29 kg/m.       DVT prophylaxis: SCDs Start: 12/27/20 1308 Code Status:   Code Status: Full Code  Family Communication: plan of care discussed with patient at bedside.  Status is: Inpatient  Remains inpatient appropriate because:Inpatient level of care  appropriate due to severity of illness  Dispo: The patient is from: Home              Anticipated d/c is to: Home              Patient currently is not medically stable to d/c.   Difficult to place patient No       Unresulted Labs (From admission, onward)     Start     Ordered   12/28/20 1449  Aerobic/Anaerobic Culture w Gram Stain (surgical/deep wound)  Once,   R       Comments: Post image guided aspiration of  right posterior neck abscess.    12/28/20 1448   12/27/20 1345  MRSA Next Gen by PCR, Nasal  ONCE - STAT,   STAT        12/27/20 1329            Medications reviewed:  Scheduled Meds:  atorvastatin  40 mg Oral Q1200   [START ON 12/29/2020] buPROPion  150 mg Oral BID   calcium-vitamin D  2 tablet Oral Q breakfast   cholecalciferol  2,000 Units Oral Daily   fentaNYL       folic acid  3 mg Oral Daily   hydroxychloroquine  200 mg Oral BID   insulin aspart  0-15 Units Subcutaneous Q4H   lisinopril  10 mg Oral Daily   midazolam       multivitamin with minerals  1 tablet Oral Daily   omeprazole  40 mg Oral Daily   Continuous Infusions:  sodium chloride Stopped (12/28/20 1310)   cefTRIAXone (ROCEPHIN)  IV     [START ON 12/29/2020] vancomycin     vancomycin      Consultants:see note  Procedures:see note  Antimicrobials: Anti-infectives (From admission, onward)    Start     Dose/Rate Route Frequency Ordered Stop   12/29/20 1800  vancomycin (VANCOREADY) IVPB 1250 mg/250 mL        1,250 mg 166.7 mL/hr over 90 Minutes Intravenous Every 24 hours 12/28/20 1423     12/28/20 2000  vancomycin (VANCOREADY) IVPB 1250 mg/250 mL  Status:  Discontinued        1,250 mg 166.7 mL/hr over 90 Minutes Intravenous Every 24 hours 12/27/20 1858 12/28/20 1423   12/28/20 1645  cefTRIAXone (ROCEPHIN) 1 g in sodium chloride 0.9 % 100 mL IVPB        1 g 200 mL/hr over 30 Minutes Intravenous Every 24 hours 12/28/20 1549     12/28/20 1515  vancomycin (VANCOREADY) IVPB 1500 mg/300 mL        1,500 mg 150 mL/hr over 120 Minutes Intravenous NOW 12/28/20 1423 12/29/20 1515   12/28/20 1400  vancomycin (VANCOREADY) IVPB 1250 mg/250 mL  Status:  Discontinued        1,250 mg 166.7 mL/hr over 90 Minutes Intravenous Every 24 hours 12/27/20 1405 12/27/20 1858   12/27/20 2200  hydroxychloroquine (PLAQUENIL) tablet 200 mg        200 mg Oral 2 times daily 12/27/20 1309     12/27/20 1400  vancomycin (VANCOREADY) IVPB  1500 mg/300 mL  Status:  Discontinued        1,500 mg 150 mL/hr over 120 Minutes Intravenous  Once 12/27/20 1329 12/28/20 1423   12/27/20 1315  vancomycin (VANCOCIN) IVPB 1000 mg/200 mL premix  Status:  Discontinued        1,000 mg 200 mL/hr over 60 Minutes Intravenous  Once 12/27/20 1309 12/27/20 1329  12/27/20 1045  cefTRIAXone (ROCEPHIN) 1 g in sodium chloride 0.9 % 100 mL IVPB        1 g 200 mL/hr over 30 Minutes Intravenous  Once 12/27/20 1044 12/27/20 1206      Culture/Microbiology    Component Value Date/Time   SDES BLOOD  LEFT FOREARM 12/27/2020 0816   SPECREQUEST  12/27/2020 0816    BOTTLES DRAWN AEROBIC AND ANAEROBIC Blood Culture adequate volume   CULT  12/27/2020 0816    NO GROWTH < 24 HOURS Performed at [facility], [address] [place], [place] [address]    REPTSTATUS PENDING 12/27/2020 [id number]    Other culture-see note  Objective: Vitals: Today's Vitals   12/28/20 1445 12/28/20 1455 12/28/20 1500 12/28/20 1505  BP: (!) 138/97 (!) 148/103 (!) 158/93   Pulse: 88 97 87 81  Resp: 10 12 12 12   Temp:      TempSrc:      SpO2: 99% 96% 96% 98%  Weight:      Height:      PainSc:        Intake/Output Summary (Last 24 hours) at 12/28/2020 1553 Last data filed at 12/27/2020 2300 Gross per 24 hour  Intake 240 ml  Output --  Net 240 ml   Filed Weights   12/27/20 0711  Weight: 68 kg   Weight change:   Intake/Output from previous day: 07/07 0701 - 07/08 0700 In: 1240 [P.O.:240; I.V.:1000] Out: -  Intake/Output this shift: No intake/output data recorded. Filed Weights   12/27/20 0711  Weight: 68 kg    Examination: General exam: alert and oriented, mental status intact, fatigued HEENT: occipital swelling, NCAT, PERRL, no icterus Respiratory system: CTAB no WRR Cardiovascular system: Did not appreciate a murmur, regular, No JVD. Gastrointestinal system: No flank pain, Abdomen soft, NT,ND, BS+. Nervous System: No focal deficits. Extremities: No  edema, distal peripheral pulses palpable.  Skin: lesion on shin of LLE with surrounding eczema MSK: normal muscle tone bulk  Data Reviewed: I have personally reviewed following labs and imaging studies CBC: Recent Labs  Lab 12/27/20 0816 12/28/20 0405  WBC 13.3* 10.3  NEUTROABS 10.7*  --   HGB 13.6 11.4*  HCT 42.3 34.9*  MCV 93.0 93.8  PLT 249 818   Basic Metabolic Panel: Recent Labs  Lab 12/27/20 0816 12/28/20 0405  NA 132* 135  K 4.0 4.1  CL 97* 106  CO2 24 25  GLUCOSE 211* 148*  BUN 19 14  CREATININE 0.72 0.49  CALCIUM 8.9 8.2*   GFR: Estimated Creatinine Clearance: 72.4 mL/min (by C-G formula based on SCr of 0.49 mg/dL). Liver Function Tests: Recent Labs  Lab 12/27/20 0816  AST 24  ALT 23  ALKPHOS 66  BILITOT 0.8  PROT 7.1  ALBUMIN 4.2    HbA1C: Recent Labs    12/27/20 0816  HGBA1C 9.3*   CBG: Recent Labs  Lab 12/27/20 2005 12/28/20 0049 12/28/20 0418 12/28/20 0846 12/28/20 1317  GLUCAP 201* 193* 163* 145* 150*    Sepsis Labs: Recent Labs  Lab 12/27/20 0816 12/27/20 1125  LATICACIDVEN 2.0* 1.9    Recent Results (from the past 240 hour(s))  Blood culture (single)     Status: None (Preliminary result)   Collection Time: 12/27/20  8:16 AM   Specimen: BLOOD  Result Value Ref Range Status   Specimen Description BLOOD  LEFT FOREARM  Final   Special Requests   Final    BOTTLES DRAWN AEROBIC AND ANAEROBIC Blood Culture adequate  volume   Culture   Final    NO GROWTH < 24 HOURS Performed at [facility], [address] [place], [place] [address]    Report Status PENDING  Incomplete  Resp Panel by RT-PCR (Flu A&B, Covid) Nasopharyngeal Swab     Status: None   Collection Time: 12/27/20 11:26 AM   Specimen: Nasopharyngeal Swab; Nasopharyngeal(NP) swabs in vial transport medium  Result Value Ref Range Status   SARS Coronavirus 2 by RT PCR NEGATIVE NEGATIVE Final    Comment: (NOTE) SARS-CoV-2 target nucleic acids are NOT  DETECTED.  The SARS-CoV-2 RNA is generally detectable in upper respiratory specimens during the acute phase of infection. The lowest concentration of SARS-CoV-2 viral copies this assay can detect is 138 copies/mL. A negative result does not preclude SARS-Cov-2 infection and should not be used as the sole basis for treatment or other patient management decisions. A negative result may occur with  improper specimen collection/handling, submission of specimen other than nasopharyngeal swab, presence of viral mutation(s) within the areas targeted by this assay, and inadequate number of viral copies(<138 copies/mL). A negative result must be combined with clinical observations, patient history, and epidemiological information. The expected result is Negative.  Fact Sheet for Patients:  EntrepreneurPulse.com.au  Fact Sheet for Healthcare Providers:  IncredibleEmployment.be  This test is no t yet approved or cleared by the [place] FDA and  has been authorized for detection and/or diagnosis of SARS-CoV-2 by FDA under an Emergency Use Authorization (EUA). This EUA will remain  in effect (meaning this test can be used) for the duration of the COVID-19 declaration under Section 564(b)(1) of the Act, 21 U.S.C.section 360bbb-3(b)(1), unless the authorization is terminated  or revoked sooner.       Influenza A by PCR NEGATIVE NEGATIVE Final   Influenza B by PCR NEGATIVE NEGATIVE Final    Comment: (NOTE) The Xpert Xpress SARS-CoV-2/FLU/RSV plus assay is intended as an aid in the diagnosis of influenza from Nasopharyngeal swab specimens and should not be used as a sole basis for treatment. Nasal washings and aspirates are unacceptable for Xpert Xpress SARS-CoV-2/FLU/RSV testing.  Fact Sheet for Patients: EntrepreneurPulse.com.au  Fact Sheet for Healthcare Providers: IncredibleEmployment.be  This test is not yet  approved or cleared by the [place] FDA and has been authorized for detection and/or diagnosis of SARS-CoV-2 by FDA under an Emergency Use Authorization (EUA). This EUA will remain in effect (meaning this test can be used) for the duration of the COVID-19 declaration under Section 564(b)(1) of the Act, 21 U.S.C. section 360bbb-3(b)(1), unless the authorization is terminated or revoked.  Performed at [facility], [address] [place], [place] [address]      Radiology Studies: CT Head Wo Contrast  Result Date: 12/27/2020 CLINICAL DATA:  Painful knot on the back of the head for 2 weeks. EXAM: CT HEAD WITHOUT CONTRAST TECHNIQUE: Contiguous axial images were obtained from the base of the skull through the vertex without intravenous contrast. COMPARISON:  03/11/2011 FINDINGS: Brain: There is no evidence of an acute cortically based infarct, intracranial hemorrhage, mass, midline shift, or extra-axial fluid collection. There is new extensive confluent hypoattenuation throughout the cerebral white matter bilaterally as well as patchy hypodensity in the thalami. New focal subcentimeter hypodensities in the left basal ganglia are compatible with likely chronic lacunar infarcts. There has been mild interval cerebral volume loss. Vascular: Calcified atherosclerosis at the skull base. No hyperdense vessel. Skull: No fracture or suspicious osseous lesion. Sinuses/Orbits: Visualized paranasal sinuses  and mastoid air cells are clear. Bilateral proptosis. Other: Partially visualized swelling in the posterior aspect of the upper neck on the right, more fully evaluated on today's neck CT. IMPRESSION: 1. New extensive cerebral white matter disease. 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 2. No evidence of acute cortically based infarct or intracranial hemorrhage. 3.  Partially visualized posterior upper neck swelling, more fully evaluated on today's neck CT. Electronically Signed   By: Logan Bores M.D.   On: 12/27/2020 09:57   CT Soft Tissue Neck W Contrast  Result Date: 12/27/2020 CLINICAL DATA:  Right-sided neck swelling. Painful knot for 2 weeks. EXAM: CT NECK WITH CONTRAST TECHNIQUE: Multidetector CT imaging of the neck was performed using the standard protocol following the bolus administration of intravenous contrast. CONTRAST:  36m OMNIPAQUE IOHEXOL 300 MG/ML  SOLN COMPARISON:  None. FINDINGS: Pharynx and larynx: No evidence of mass or swelling. Patent airway. Mild right-sided retropharyngeal edema. Salivary glands: Bilateral submandibular gland atrophy. No salivary mass, stone, or acute inflammation identified. Thyroid: Unremarkable. Lymph nodes: No enlarged or suspicious lymph nodes in the neck. Vascular: Major vascular structures of the neck are grossly patent. Limited intracranial: More fully evaluated on today's head CT. Visualized orbits: Bilateral proptosis. No orbital mass or inflammation. Mastoids and visualized paranasal sinuses: Mild mucosal thickening in the left maxillary sinus. Clear mastoid air cells. Skeleton: No acute osseous abnormality or suspicious osseous lesion. Mild cervical spondylosis. Upper chest: Clear lung apices. Other: A rim enhancing low-density collection in the right suboccipital soft tissues measures 2.9 x 2.3 x 3.3 cm. There is extensive surrounding inflammation involving the right-sided posterior neck musculature and overlying subcutaneous fat with swelling extending laterally and inferiorly in the neck. There is also milder inflammation more anteriorly in the neck on the right including in the right carotid space. IMPRESSION: 3 cm right suboccipital abscess with extensive surrounding inflammation. Electronically Signed   By: ALogan BoresM.D.   On: 12/27/2020 10:05   [place]Abscess Drain  Result Date: 12/28/2020 INDICATION: Concern  for right-sided sub occipital abscess. Please perform ultrasound-guided aspiration for diagnostic purposes. EXAM: ULTRASOUND GUIDED ABSCESS DRAINAGE COMPARISON:  Neck CT-12/27/2020 MEDICATIONS: The patient is currently admitted to the hospital and receiving intravenous antibiotics. The antibiotics were administered within an appropriate time frame prior to the initiation of the procedure. ANESTHESIA/SEDATION: Moderate (conscious) sedation was employed during this procedure. A total of Versed 2 mg and Fentanyl 100 mcg was administered intravenously. Moderate Sedation Time: 15 minutes. The patient's level of consciousness and vital signs were monitored continuously by radiology nursing throughout the procedure under my direct supervision. CONTRAST:  None COMPLICATIONS: None immediate. PROCEDURE: Informed written consent was obtained from the patient after a discussion of the risks, benefits and alternatives to treatment. Preprocedural ultrasound scanning demonstrated an approximately 4.1 x 3.4 x 2.2 cm complex fluid collection within the deep tissues of the suboccipital portion of the right-side of the neck (images 3 and 6), correlating with the suspected abscess seen on preceding contrast-enhanced neck CT images 23, series 3. A timeout was performed prior to the initiation of the procedure. The the skin overlying the operative site was prepped and draped in the usual sterile fashion. The overlying soft tissues were anesthetized with 1% lidocaine with epinephrine. Under direct ultrasound guidance, a 18 gauge spinal was advanced into the abscess/fluid collection. Multiple ultrasound images were saved for procedural documentation purposes.  Despite appropriate needle positioning, only approximately 5 cc of thick purulent fluid was able to be aspirated The collection was accessed in 2 separate locations yielding an additional 5 cc thick purulent fluid. All aspirated fluid was capped and sent to the laboratory for  analysis. Superficial hemostasis was achieved with manual compression. A dressing was placed. The patient tolerated the procedure well without immediate postprocedural complication. IMPRESSION: Technically successful ultrasound-guided aspiration of a total of approximately 10 cc of very thick purulent fluid from right-sided sub occipital abscess. As a moderate amount of complex infection remains, definitive surgical debridement should be considered. Above recommendation was relayed to the referring surgeon at the time of procedure completion. Electronically Signed   By: Sandi Mariscal M.D.   On: 12/28/2020 15:38     LOS: 0 days   George Hugh, MD [facility]  12/28/2020, 3:53 PM

## 2020-12-28 NOTE — Procedures (Signed)
Pre procedural Dx: Right posterior neck abscess Post procedural Dx: Same  Technically successful US guided aspiration of approximately 10 cc of thick purulent fluid, however a moderate volume remains and is NOT amendable to percutaneous aspiration given thick nature of the complex fluid/infection and definitive surgical debridement should be considered as indicated.  All aspirated fluid was capped and sent to lab for analysis.   EBL: Trace Complications: None immediate  Ronny Bacon, MD Pager #: [phone]

## 2020-12-28 NOTE — Consult Note (Signed)
Pharmacy Antibiotic Note  April Chaney is a 50 y.o. female admitted on 12/27/2020 with cellulitis. Pt c/o painful knot on the back of her head for the past 2 weeks; prescribed outpt doxycycline but only received 2 doses PTA. Pharmacy has been consulted for Vancomycin dosing.  7/07: CT scan of the neck with contrast shows 3 cm right suboccipital abscess with extensive surrounding inflammation.  7/8 Vancomycin loading dose of 1500mg  was not given as ordered.  Plan: Reordered Vancomycin 1.5g x1 loading dose; then vancomycin 1250mg  IV q24h Predicted AUC 468; Cmax 35; Cmin 10.2 Scr 0.8; Vd 0.72; on IBW Received Ceftriaxone 2g x1 in ED  Height: 5' (152.4 cm) Weight: 68 kg (150 lb) IBW/kg (Calculated) : 45.5  Temp (24hrs), Avg:98.2 F (36.8 C), Min:98 F (36.7 C), Max:98.3 F (36.8 C)  Recent Labs  Lab 12/21/20 1520 12/27/20 0816 12/27/20 1125 12/28/20 0405  WBC 9.9 13.3*  --  10.3  CREATININE 1.06* 0.72  --  0.49  LATICACIDVEN  --  2.0* 1.9  --      Estimated Creatinine Clearance: 72.4 mL/min (by C-G formula based on SCr of 0.49 mg/dL).    Allergies  Allergen Reactions   Pantoprazole Rash    Antimicrobials this admission: Vancomycin 7/08 >>  Ceftriaxone (7/07)  Dose adjustments this admission: N/A CTM for adjustments prn  Microbiology results: 7/07 BCx: (single set) - pending 7/07 MRSA PCR: ordered  Thank you for allowing pharmacy to be a part of this patient's care.  Paulina Fusi, PharmD, BCPS 12/28/2020 2:27 PM

## 2020-12-29 ENCOUNTER — Inpatient Hospital Stay: Payer: BC Managed Care – PPO | Admitting: Certified Registered"

## 2020-12-29 ENCOUNTER — Encounter
Admission: EM | Disposition: A | Discharge status: Home / Self Care | Payer: Self-pay | Source: Home / Self Care | Attending: Internal Medicine

## 2020-12-29 DIAGNOSIS — L0291 Cutaneous abscess, unspecified: Secondary | ICD-10-CM

## 2020-12-29 HISTORY — PX: INCISION AND DRAINAGE ABSCESS: SHX5864

## 2020-12-29 LAB — CBC WITH DIFFERENTIAL/PLATELET
Abs Immature Granulocytes: 0.04 10*3/uL (ref 0.00–0.07)
Basophils Absolute: 0 10*3/uL (ref 0.0–0.1)
Basophils Relative: 0 %
Eosinophils Absolute: 0.5 10*3/uL (ref 0.0–0.5)
Eosinophils Relative: 7 %
HCT: 37.1 % (ref 36.0–46.0)
Hemoglobin: 12 g/dL (ref 12.0–15.0)
Immature Granulocytes: 1 %
Lymphocytes Relative: 8 %
Lymphs Abs: 0.6 10*3/uL — ABNORMAL LOW (ref 0.7–4.0)
MCH: 30.2 pg (ref 26.0–34.0)
MCHC: 32.3 g/dL (ref 30.0–36.0)
MCV: 93.5 fL (ref 80.0–100.0)
Monocytes Absolute: 0.8 10*3/uL (ref 0.1–1.0)
Monocytes Relative: 10 %
Neutro Abs: 5.8 10*3/uL (ref 1.7–7.7)
Neutrophils Relative %: 74 %
Platelets: 215 10*3/uL (ref 150–400)
RBC: 3.97 MIL/uL (ref 3.87–5.11)
RDW: 13.7 % (ref 11.5–15.5)
WBC: 7.8 10*3/uL (ref 4.0–10.5)
nRBC: 0 % (ref 0.0–0.2)

## 2020-12-29 LAB — BASIC METABOLIC PANEL
Anion gap: 4 — ABNORMAL LOW (ref 5–15)
BUN: 8 mg/dL (ref 6–20)
CO2: 26 mmol/L (ref 22–32)
Calcium: 8.4 mg/dL — ABNORMAL LOW (ref 8.9–10.3)
Chloride: 106 mmol/L (ref 98–111)
Creatinine, Ser: 0.48 mg/dL (ref 0.44–1.00)
GFR, Estimated: >60 mL/min (ref >60)
Glucose, Bld: 131 mg/dL — ABNORMAL HIGH (ref 70–99)
Potassium: 3.6 mmol/L (ref 3.5–5.1)
Sodium: 136 mmol/L (ref 135–145)

## 2020-12-29 LAB — BRAIN NATRIURETIC PEPTIDE: B Natriuretic Peptide: 187.2 pg/mL — ABNORMAL HIGH (ref 0.0–100.0)

## 2020-12-29 LAB — GLUCOSE, CAPILLARY
Glucose-Capillary: 148 mg/dL — ABNORMAL HIGH (ref 70–99)
Glucose-Capillary: 153 mg/dL — ABNORMAL HIGH (ref 70–99)
Glucose-Capillary: 155 mg/dL — ABNORMAL HIGH (ref 70–99)
Glucose-Capillary: 221 mg/dL — ABNORMAL HIGH (ref 70–99)
Glucose-Capillary: 258 mg/dL — ABNORMAL HIGH (ref 70–99)
Glucose-Capillary: 260 mg/dL — ABNORMAL HIGH (ref 70–99)
Glucose-Capillary: 353 mg/dL — ABNORMAL HIGH (ref 70–99)

## 2020-12-29 LAB — HEPATIC FUNCTION PANEL
ALT: 15 U/L (ref 0–44)
AST: 19 U/L (ref 15–41)
Albumin: 3.5 g/dL (ref 3.5–5.0)
Alkaline Phosphatase: 60 U/L (ref 38–126)
Bilirubin, Direct: <0.1 mg/dL (ref 0.0–0.2)
Total Bilirubin: 0.6 mg/dL (ref 0.3–1.2)
Total Protein: 6 g/dL — ABNORMAL LOW (ref 6.5–8.1)

## 2020-12-29 LAB — CK: Total CK: 49 U/L (ref 38–234)

## 2020-12-29 LAB — SEDIMENTATION RATE: Sed Rate: 9 mm/hr (ref 0–30)

## 2020-12-29 LAB — LACTIC ACID, PLASMA: Lactic Acid, Venous: 1.1 mmol/L (ref 0.5–1.9)

## 2020-12-29 LAB — C-REACTIVE PROTEIN: CRP: 0.6 mg/dL (ref <1.0)

## 2020-12-29 SURGERY — INCISION AND DRAINAGE, ABSCESS
Anesthesia: General

## 2020-12-29 MED ORDER — DEXAMETHASONE SODIUM PHOSPHATE 10 MG/ML IJ SOLN
INTRAMUSCULAR | Status: AC
  Filled 2020-12-29: qty 1

## 2020-12-29 MED ORDER — ROCURONIUM BROMIDE 100 MG/10ML IV SOLN
INTRAVENOUS | Status: DC | PRN
  Administered 2020-12-29: 70 mg via INTRAVENOUS

## 2020-12-29 MED ORDER — LIDOCAINE HCL (CARDIAC) PF 100 MG/5ML IV SOSY
PREFILLED_SYRINGE | INTRAVENOUS | Status: DC | PRN
  Administered 2020-12-29: 100 mg via INTRAVENOUS

## 2020-12-29 MED ORDER — MIDAZOLAM HCL 2 MG/2ML IJ SOLN
INTRAMUSCULAR | Status: AC
  Filled 2020-12-29: qty 2

## 2020-12-29 MED ORDER — KETOROLAC TROMETHAMINE 30 MG/ML IJ SOLN: 30.0000 mg | Freq: Once | INTRAMUSCULAR | Status: AC | PRN

## 2020-12-29 MED ORDER — SUGAMMADEX SODIUM 500 MG/5ML IV SOLN
INTRAVENOUS | Status: DC | PRN
  Administered 2020-12-29: 300 mg via INTRAVENOUS

## 2020-12-29 MED ORDER — MICROFIBRILLAR COLL HEMOSTAT EX POWD
CUTANEOUS | Status: DC | PRN
  Administered 2020-12-29: 1 g via TOPICAL

## 2020-12-29 MED ORDER — ONDANSETRON HCL 4 MG/2ML IJ SOLN: 4.0000 mg | Freq: Once | INTRAMUSCULAR | Status: DC | PRN

## 2020-12-29 MED ORDER — PROPOFOL 10 MG/ML IV BOLUS
INTRAVENOUS | Status: DC | PRN
  Administered 2020-12-29: 150 mg via INTRAVENOUS

## 2020-12-29 MED ORDER — FENTANYL CITRATE (PF) 100 MCG/2ML IJ SOLN: 25.0000 ug | INTRAMUSCULAR | Status: DC | PRN

## 2020-12-29 MED ORDER — FENTANYL CITRATE (PF) 100 MCG/2ML IJ SOLN
INTRAMUSCULAR | Status: AC
  Filled 2020-12-29: qty 2

## 2020-12-29 MED ORDER — EPHEDRINE SULFATE 50 MG/ML IJ SOLN
INTRAMUSCULAR | Status: DC | PRN
  Administered 2020-12-29: 5 mg via INTRAVENOUS
  Administered 2020-12-29: 10 mg via INTRAVENOUS

## 2020-12-29 MED ORDER — ONDANSETRON HCL 4 MG/2ML IJ SOLN
INTRAMUSCULAR | Status: AC
  Filled 2020-12-29: qty 2

## 2020-12-29 MED ORDER — DEXAMETHASONE SODIUM PHOSPHATE 10 MG/ML IJ SOLN
INTRAMUSCULAR | Status: DC | PRN
  Administered 2020-12-29: 4 mg via INTRAVENOUS

## 2020-12-29 MED ORDER — SEVOFLURANE IN SOLN
RESPIRATORY_TRACT | Status: AC
  Filled 2020-12-29: qty 250

## 2020-12-29 MED ORDER — KETOROLAC TROMETHAMINE 30 MG/ML IJ SOLN
INTRAMUSCULAR | Status: AC
  Administered 2020-12-29: 30 mg via INTRAVENOUS
  Filled 2020-12-29: qty 1

## 2020-12-29 MED ORDER — 0.9 % SODIUM CHLORIDE (POUR BTL) OPTIME
TOPICAL | Status: DC | PRN
  Administered 2020-12-29: 500 mL

## 2020-12-29 MED ORDER — HYDROCODONE-ACETAMINOPHEN 7.5-325 MG PO TABS: 1.0000 | ORAL_TABLET | Freq: Once | ORAL | Status: DC | PRN

## 2020-12-29 MED ORDER — MORPHINE SULFATE (PF) 4 MG/ML IV SOLN: 4.0000 mg | INTRAVENOUS | Status: DC | PRN

## 2020-12-29 MED ORDER — PROPOFOL 10 MG/ML IV BOLUS
INTRAVENOUS | Status: AC
  Filled 2020-12-29: qty 20

## 2020-12-29 MED ORDER — BUPIVACAINE-EPINEPHRINE 0.5% -1:200000 IJ SOLN
INTRAMUSCULAR | Status: DC | PRN
  Administered 2020-12-29: 30 mL

## 2020-12-29 MED ORDER — CAMPHOR-MENTHOL 0.5-0.5 % EX LOTN
TOPICAL_LOTION | CUTANEOUS | Status: DC | PRN
  Filled 2020-12-29: qty 222

## 2020-12-29 MED ORDER — SUGAMMADEX SODIUM 500 MG/5ML IV SOLN
INTRAVENOUS | Status: AC
  Filled 2020-12-29: qty 5

## 2020-12-29 MED ORDER — PHENYLEPHRINE HCL (PRESSORS) 10 MG/ML IV SOLN
INTRAVENOUS | Status: AC
  Filled 2020-12-29: qty 1

## 2020-12-29 MED ORDER — ONDANSETRON HCL 4 MG/2ML IJ SOLN
INTRAMUSCULAR | Status: DC | PRN
  Administered 2020-12-29: 4 mg via INTRAVENOUS

## 2020-12-29 MED ORDER — EPHEDRINE 5 MG/ML INJ
INTRAVENOUS | Status: AC
  Filled 2020-12-29: qty 10

## 2020-12-29 MED ORDER — LIDOCAINE HCL (PF) 2 % IJ SOLN
INTRAMUSCULAR | Status: AC
  Filled 2020-12-29: qty 5

## 2020-12-29 MED ORDER — ROCURONIUM BROMIDE 10 MG/ML (PF) SYRINGE
PREFILLED_SYRINGE | INTRAVENOUS | Status: AC
  Filled 2020-12-29: qty 10

## 2020-12-29 MED ORDER — PHENYLEPHRINE HCL (PRESSORS) 10 MG/ML IV SOLN
INTRAVENOUS | Status: DC | PRN
  Administered 2020-12-29 (×2): 50 ug via INTRAVENOUS

## 2020-12-29 MED ORDER — FENTANYL CITRATE (PF) 100 MCG/2ML IJ SOLN
INTRAMUSCULAR | Status: DC | PRN
  Administered 2020-12-29: 50 ug via INTRAVENOUS
  Administered 2020-12-29: 100 ug via INTRAVENOUS
  Administered 2020-12-29: 50 ug via INTRAVENOUS

## 2020-12-29 MED ORDER — DROPERIDOL 2.5 MG/ML IJ SOLN
0.6250 mg | Freq: Once | INTRAMUSCULAR | Status: DC | PRN
  Filled 2020-12-29: qty 2

## 2020-12-29 SURGICAL SUPPLY — 26 items
BLADE CLIPPER SURG (BLADE) ×2 IMPLANT
BLADE SURG 15 STRL LF DISP TIS (BLADE) ×1 IMPLANT
BLADE SURG 15 STRL SS (BLADE) ×1
BRUSH SCRUB EZ  4% CHG (MISCELLANEOUS) ×1
BRUSH SCRUB EZ 4% CHG (MISCELLANEOUS) ×1 IMPLANT
DRAPE CHEST BREAST 77X106 FENE (MISCELLANEOUS) IMPLANT
DRAPE LAPAROTOMY 77X122 PED (DRAPES) ×2 IMPLANT
ELECT CAUTERY BLADE 6.4 (BLADE) ×1 IMPLANT
ELECT REM PT RETURN 9FT ADLT (ELECTROSURGICAL) ×2
ELECTRODE REM PT RTRN 9FT ADLT (ELECTROSURGICAL) ×1 IMPLANT
GAUZE 4X4 16PLY ~~LOC~~+RFID DBL (SPONGE) ×2 IMPLANT
GAUZE PACKING IODOFORM 1/2 (PACKING) ×1 IMPLANT
GLOVE SURG ENC MOIS LTX SZ6.5 (GLOVE) ×2 IMPLANT
GLOVE SURG UNDER POLY LF SZ6.5 (GLOVE) ×2 IMPLANT
GOWN STRL REUS W/ TWL LRG LVL3 (GOWN DISPOSABLE) ×2 IMPLANT
GOWN STRL REUS W/TWL LRG LVL3 (GOWN DISPOSABLE) ×2
MANIFOLD NEPTUNE II (INSTRUMENTS) ×2 IMPLANT
NEEDLE HYPO 22GX1.5 SAFETY (NEEDLE) ×2 IMPLANT
NS IRRIG 1000ML POUR BTL (IV SOLUTION) ×2 IMPLANT
PACK BASIN MINOR ARMC (MISCELLANEOUS) ×2 IMPLANT
SOL PREP PVP 2OZ (MISCELLANEOUS)
SOLUTION PREP PVP 2OZ (MISCELLANEOUS) ×2 IMPLANT
SPONGE T-LAP 18X18 ~~LOC~~+RFID (SPONGE) ×2 IMPLANT
SUT ETHILON 2 0 FS 18 (SUTURE) ×1 IMPLANT
SUT VIC AB 2-0 SH 27 (SUTURE) ×1
SUT VIC AB 2-0 SH 27XBRD (SUTURE) IMPLANT

## 2020-12-29 NOTE — Anesthesia Preprocedure Evaluation (Addendum)
Anesthesia Evaluation  Patient identified by MRN, date of birth, ID band Patient awake    History of Anesthesia Complications (+) Family history of anesthesia reaction and history of anesthetic complications  Airway Mallampati: III  TM Distance: >3 FB Neck ROM: Limited    Dental  (+) Teeth Intact   Pulmonary Current Smoker,  +snoring          Cardiovascular Exercise Tolerance: Poor hypertension, +CHF   Rhythm:Regular  04/06/19 ECHO: 1. Left ventricular ejection fraction, by visual estimation, is 35 to 40%. The left ventricle has moderately decreased function. Mild to moderately increased left ventricular size. There is no left ventricular hypertrophy. 2. Multiple segmental abnormalities exist.  3. Global right ventricle has mildly reduced systolic function.The right ventricular size is mildly enlarged. No increase in right ventricular wall thickness. 4. Left atrial size was normal. 5. Right atrial size was normal. 6. The mitral valve is grossly normal. No evidence of mitral valve regurgitation. 7. The tricuspid valve is normal in structure. Tricuspid valve regurgitation is mild. 8. The aortic valve is normal in structure. Aortic valve regurgitation was not visualized by color flow Doppler. 9. The pulmonic valve was grossly normal. Pulmonic valve regurgitation is not visualized by color flow Doppler. 10. Normal pulmonary artery systolic pressure.  12/10/19 EKG: SINUS TACHYCARDIA 108 OTHERWISE NORMAL ECG     Neuro/Psych PSYCHIATRIC DISORDERS Depression  Neuromuscular disease    GI/Hepatic GERD  ,pancreatitis   Endo/Other  diabetes, Type 2  Renal/GU      Musculoskeletal  (+) Arthritis , Rheumatoid disorders,  S/p THR   Abdominal   Peds  Hematology   Anesthesia Other Findings Bandage right occiput/neck  Reproductive/Obstetrics                            Anesthesia Physical Anesthesia  Plan  ASA: 3  Anesthesia Plan: General   Post-op Pain Management:    Induction:   PONV Risk Score and Plan:   Airway Management Planned: Oral ETT  Additional Equipment:   Intra-op Plan:   Post-operative Plan: Extubation in OR  Informed Consent: I have reviewed the patients History and Physical, chart, labs and discussed the procedure including the risks, benefits and alternatives for the proposed anesthesia with the patient or authorized representative who has indicated his/her understanding and acceptance.     Dental advisory given  Plan Discussed with:   Anesthesia Plan Comments: (OSA score 2)       Anesthesia Quick Evaluation

## 2020-12-29 NOTE — Transfer of Care (Signed)
Immediate Anesthesia Transfer of Care Note  Patient: April Chaney  Procedure(s) Performed: INCISION AND DRAINAGE ABSCESS  Patient Location: PACU  Anesthesia Type:General  Level of Consciousness: awake and patient cooperative  Airway & Oxygen Therapy: Patient Spontanous Breathing and Patient connected to nasal cannula oxygen  Post-op Assessment: Report given to RN and Post -op Vital signs reviewed and stable  Post vital signs: Reviewed and stable  Last Vitals:  Vitals Value Taken Time  BP 186/95 12/29/20 1030  Temp    Pulse 96 12/29/20 1034  Resp 15 12/29/20 1034  SpO2 100 % 12/29/20 1034  Vitals shown include unvalidated device data.  Last Pain:  Vitals:   12/29/20 0735  TempSrc:   PainSc: 8          Complications: No notable events documented.

## 2020-12-29 NOTE — Progress Notes (Signed)
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  CT shows 2.9 x 2.3 x 3.3 cn abscess in the right suboccipital area that extends into the carotid space.  Patient denies any trauma or surgeries or IV drug use.  IR attempted to drain abscess today but was unsuccessful.    Subjective: Ms. April Chaney is doing POD1 s/p I&D. She has nuchal pain with limited ROM due to neck swelling.   Neurological exam is normal. There is a superficial lesion on the left shin -chronic.  Assessment & Plan: Principal Problem:   Abscess Active Problems:   Benign essential hypertension   GERD without esophagitis   Nicotine dependence   Rheumatoid arthritis involving multiple sites with positive rheumatoid factor (HCC)  3 cm Posterior Neck Abscess that extends into the right carotid sheath - possibly secondary to oral cavity infection in the setting of immunocompromised state: The cellulitis extends into the right carotid space. This is concerning for a deep space infection.   - 7/8 IR attempted to perform percutaneous drainage today but was unsuccessful due to dense contents of the abscess. - 7/8 GS performed I&D.   - She is on Day 1 of IV Vancomycin and Unasyn post source control.  ID is following, appreciate. - Follow up on Surgical cultures.   - Remove packing and hopefully discharge tomorrow on oral antibiotics x 10 days. - Hold HQ for now.  Occipital Throbbing Headache: - Oxycodone for long acting control and Dilaudid PRN for breakthrough pain.  Rheumatoid Arthritis: - Hold HQ temporarily. Restart  once we have source control.  History of Systolic Heart Failure with EF 35-40%: - Continue fluids for now. - Echo is pending. - Start GDMT if EF remains low.  Major Depressive Disorder: - Decrease Bupropion to 150 mg BID.   - Still waiting on med rec confirmation.  History of Pancreatitis / Alcohol Use: - Abdomen is benign.  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  Diet Order             Diet Carb Modified Fluid consistency: Thin; Room service appropriate? Yes  Diet effective now                         Patient's Body mass index is 29.29 kg/m.     DVT prophylaxis: SCDs Start: 12/27/20 1308 Code Status:   Code Status: Full Code  Family Communication: plan of care discussed with patient at bedside.  Status is: Inpatient  Remains inpatient appropriate because:Inpatient level of care appropriate due to severity of illness  Dispo: The patient is from: Home              Anticipated d/c is to: Home Hopefully discharge home tomorrow.              Patient currently is not medically stable to d/c.   Difficult to place patient No    Unresulted Labs (From admission, onward)     Start     Ordered   12/29/20 8127  Basic metabolic panel  Daily,   R      12/28/20  2349   12/29/20 0500  CBC with Differential/Platelet  Daily,   R      12/28/20 2349   12/28/20 1839  Aerobic Culture w Gram Stain (superficial specimen)  Once,   R        12/28/20 1839   12/28/20 1839  Aerobic Culture w Gram Stain (superficial specimen)  Once,   R        12/28/20 1839   12/28/20 1449  Aerobic/Anaerobic Culture w Gram Stain (surgical/deep wound)  Once,   R       Comments: Post image guided aspiration of right posterior neck abscess.    12/28/20 1448            Medications reviewed:  Scheduled Meds:  atorvastatin  40 mg Oral Q1200   buPROPion  150 mg Oral BID   calcium-vitamin D  2 tablet Oral Q breakfast   cholecalciferol  2,000 Units Oral Daily   folic acid  3 mg Oral Daily    insulin aspart  0-15 Units Subcutaneous Q4H   lisinopril  10 mg Oral Daily   multivitamin with minerals  1 tablet Oral Daily   omeprazole  40 mg Oral Daily   Continuous Infusions:  sodium chloride 75 mL/hr at 12/29/20 1348   ampicillin-sulbactam (UNASYN) IV 3 g (12/29/20 1348)   vancomycin      Consultants:see note  Procedures:see note  Antimicrobials: Anti-infectives (From admission, onward)    Start     Dose/Rate Route Frequency Ordered Stop   12/29/20 1800  vancomycin (VANCOREADY) IVPB 1250 mg/250 mL        1,250 mg 166.7 mL/hr over 90 Minutes Intravenous Every 24 hours 12/28/20 1423     12/28/20 2000  vancomycin (VANCOREADY) IVPB 1250 mg/250 mL  Status:  Discontinued        1,250 mg 166.7 mL/hr over 90 Minutes Intravenous Every 24 hours 12/27/20 1858 12/28/20 1423   12/28/20 2000  Ampicillin-Sulbactam (UNASYN) 3 g in sodium chloride 0.9 % 100 mL IVPB        3 g 200 mL/hr over 30 Minutes Intravenous Every 6 hours 12/28/20 1903     12/28/20 1700  metroNIDAZOLE (FLAGYL) IVPB 500 mg  Status:  Discontinued        500 mg 100 mL/hr over 60 Minutes Intravenous Every 8 hours 12/28/20 1609 12/28/20 1619   12/28/20 1645  cefTRIAXone (ROCEPHIN) 1 g in sodium chloride 0.9 % 100 mL IVPB  Status:  Discontinued        1 g 200 mL/hr over 30 Minutes Intravenous Every 24 hours 12/28/20 1549 12/28/20 1619   12/28/20 1515  vancomycin (VANCOREADY) IVPB 1500 mg/300 mL        1,500 mg 150 mL/hr over 120 Minutes Intravenous NOW 12/28/20 1423 12/29/20 1348   12/28/20 1400  vancomycin (VANCOREADY) IVPB 1250 mg/250 mL  Status:  Discontinued        1,250 mg 166.7 mL/hr over 90 Minutes Intravenous Every 24 hours 12/27/20 1405 12/27/20 1858   12/27/20 2200  hydroxychloroquine (PLAQUENIL) tablet 200 mg  Status:  Discontinued        200 mg Oral 2 times daily 12/27/20 1309 12/28/20 1612   12/27/20 1400  vancomycin (VANCOREADY) IVPB 1500 mg/300 mL  Status:  Discontinued        1,500 mg 150 mL/hr over  120 Minutes Intravenous  Once 12/27/20 1329 12/28/20 1423   12/27/20 1315  vancomycin (VANCOCIN) IVPB 1000 mg/200 mL premix  Status:  Discontinued  1,000 mg 200 mL/hr over 60 Minutes Intravenous  Once 12/27/20 1309 12/27/20 1329   12/27/20 1045  cefTRIAXone (ROCEPHIN) 1 g in sodium chloride 0.9 % 100 mL IVPB        1 g 200 mL/hr over 30 Minutes Intravenous  Once 12/27/20 1044 12/27/20 1206      Culture/Microbiology    Component Value Date/Time   SDES BLOOD  LEFT FOREARM 12/27/2020 0816   SPECREQUEST  12/27/2020 0816    BOTTLES DRAWN AEROBIC AND ANAEROBIC Blood Culture adequate volume   CULT  12/27/2020 0816    NO GROWTH 2 DAYS Performed at [facility], [address], [place], [place] [address]    REPTSTATUS PENDING 12/27/2020 [phone]    Other culture-see note  Objective: Vitals: Today's Vitals   12/29/20 1104 12/29/20 1108 12/29/20 1109 12/29/20 1115  BP:  (!) 166/79    Pulse: 84 95 86   Resp: 18 19 19    Temp:  (!) 97.3 F (36.3 C)  (!) 97.3 F (36.3 C)  TempSrc:      SpO2: 97% 99% 99%   Weight:      Height:      PainSc:    2     Intake/Output Summary (Last 24 hours) at 12/29/2020 1539 Last data filed at 12/29/2020 1018 Gross per 24 hour  Intake --  Output 10 ml  Net -10 ml    Filed Weights   12/27/20 0711  Weight: 68 kg   Weight change:   Intake/Output from previous day: No intake/output data recorded. Intake/Output this shift: Total I/O In: -  Out: 10 [Blood:10] Filed Weights   12/27/20 0711  Weight: 68 kg    Examination: General exam: alert and oriented, mental status intact, fatigued HEENT: occipital swelling, NCAT, PERRL, no icterus Respiratory system: CTAB no WRR Cardiovascular system: Did not appreciate a murmur, regular, No JVD. Gastrointestinal system: No flank pain, Abdomen soft, NT,ND, BS+. Nervous System: No focal deficits. Extremities: No edema, distal peripheral pulses palpable.  Skin: lesion on shin of LLE with  surrounding eczema MSK: normal muscle tone bulk  Data Reviewed: I have personally reviewed following labs and imaging studies CBC: Recent Labs  Lab 12/27/20 0816 12/28/20 0405 12/29/20 0828  WBC 13.3* 10.3 7.8  NEUTROABS 10.7*  --  5.8  HGB 13.6 11.4* 12.0  HCT 42.3 34.9* 37.1  MCV 93.0 93.8 93.5  PLT 249 214 283    Basic Metabolic Panel: Recent Labs  Lab 12/27/20 0816 12/28/20 0405 12/29/20 0828  NA 132* 135 136  K 4.0 4.1 3.6  CL 97* 106 106  CO2 24 25 26   GLUCOSE 211* 148* 131*  BUN 19 14 8   CREATININE 0.72 0.49 0.48  CALCIUM 8.9 8.2* 8.4*    GFR: Estimated Creatinine Clearance: 72.4 mL/min (by C-G formula based on SCr of 0.48 mg/dL). Liver Function Tests: Recent Labs  Lab 12/27/20 0816 12/29/20 0828  AST 24 19  ALT 23 15  ALKPHOS 66 60  BILITOT 0.8 0.6  PROT 7.1 6.0*  ALBUMIN 4.2 3.5     HbA1C: Recent Labs    12/27/20 0816  HGBA1C 9.3*    CBG: Recent Labs  Lab 12/29/20 0040 12/29/20 0520 12/29/20 0830 12/29/20 1050 12/29/20 1303  GLUCAP 260* 153* 148* 155* 221*     Sepsis Labs: Recent Labs  Lab 12/27/20 0816 12/27/20 1125 12/29/20 0828  LATICACIDVEN 2.0* 1.9 1.1     Recent Results (from the past 240 hour(s))  Blood culture (single)  Status: None (Preliminary result)   Collection Time: 12/27/20  8:16 AM   Specimen: BLOOD  Result Value Ref Range Status   Specimen Description BLOOD  LEFT FOREARM  Final   Special Requests   Final    BOTTLES DRAWN AEROBIC AND ANAEROBIC Blood Culture adequate volume   Culture   Final    NO GROWTH 2 DAYS Performed at [facility], [address] [place], [place] [address]    Report Status PENDING  Incomplete  Resp Panel by RT-PCR (Flu A&B, Covid) Nasopharyngeal Swab     Status: None   Collection Time: 12/27/20 11:26 AM   Specimen: Nasopharyngeal Swab; Nasopharyngeal(NP) swabs in vial transport medium  Result Value Ref Range Status   SARS Coronavirus 2 by RT PCR NEGATIVE  NEGATIVE Final    Comment: (NOTE) SARS-CoV-2 target nucleic acids are NOT DETECTED.  The SARS-CoV-2 RNA is generally detectable in upper respiratory specimens during the acute phase of infection. The lowest concentration of SARS-CoV-2 viral copies this assay can detect is 138 copies/mL. A negative result does not preclude SARS-Cov-2 infection and should not be used as the sole basis for treatment or other patient management decisions. A negative result may occur with  improper specimen collection/handling, submission of specimen other than nasopharyngeal swab, presence of viral mutation(s) within the areas targeted by this assay, and inadequate number of viral copies(<138 copies/mL). A negative result must be combined with clinical observations, patient history, and epidemiological information. The expected result is Negative.  Fact Sheet for Patients:  EntrepreneurPulse.com.au  Fact Sheet for Healthcare Providers:  IncredibleEmployment.be  This test is no t yet approved or cleared by the [place] FDA and  has been authorized for detection and/or diagnosis of SARS-CoV-2 by FDA under an Emergency Use Authorization (EUA). This EUA will remain  in effect (meaning this test can be used) for the duration of the COVID-19 declaration under Section 564(b)(1) of the Act, 21 U.S.C.section 360bbb-3(b)(1), unless the authorization is terminated  or revoked sooner.       Influenza A by PCR NEGATIVE NEGATIVE Final   Influenza B by PCR NEGATIVE NEGATIVE Final    Comment: (NOTE) The Xpert Xpress SARS-CoV-2/FLU/RSV plus assay is intended as an aid in the diagnosis of influenza from Nasopharyngeal swab specimens and should not be used as a sole basis for treatment. Nasal washings and aspirates are unacceptable for Xpert Xpress SARS-CoV-2/FLU/RSV testing.  Fact Sheet for Patients: EntrepreneurPulse.com.au  Fact Sheet for Healthcare  Providers: IncredibleEmployment.be  This test is not yet approved or cleared by the [place] FDA and has been authorized for detection and/or diagnosis of SARS-CoV-2 by FDA under an Emergency Use Authorization (EUA). This EUA will remain in effect (meaning this test can be used) for the duration of the COVID-19 declaration under Section 564(b)(1) of the Act, 21 U.S.C. section 360bbb-3(b)(1), unless the authorization is terminated or revoked.  Performed at [facility], [address] [place], [place] [address]   MRSA Next Gen by PCR, Nasal     Status: None   Collection Time: 12/27/20  7:09 PM   Specimen: Nasal Mucosa; Nasal Swab  Result Value Ref Range Status   MRSA by PCR Next Gen NOT DETECTED NOT DETECTED Final    Comment: (NOTE) The GeneXpert MRSA Assay (FDA approved for NASAL specimens only), is one component of a comprehensive MRSA colonization surveillance program. It is not intended to diagnose MRSA infection nor to guide or monitor treatment for MRSA infections. Test performance is  not FDA approved in patients less than 38 years old. Performed at [facility], [address] [place], [place] [address]       Radiology Studies: [place] Abscess Drain  Result Date: 12/28/2020 INDICATION: Concern for right-sided sub occipital abscess. Please perform ultrasound-guided aspiration for diagnostic purposes. EXAM: ULTRASOUND GUIDED ABSCESS DRAINAGE COMPARISON:  Neck CT-12/27/2020 MEDICATIONS: The patient is currently admitted to the hospital and receiving intravenous antibiotics. The antibiotics were administered within an appropriate time frame prior to the initiation of the procedure. ANESTHESIA/SEDATION: Moderate (conscious) sedation was employed during this procedure. A total of Versed 2 mg and Fentanyl 100 mcg was administered intravenously. Moderate Sedation Time: 15 minutes. The patient's level of consciousness and vital signs were  monitored continuously by radiology nursing throughout the procedure under my direct supervision. CONTRAST:  None COMPLICATIONS: None immediate. PROCEDURE: Informed written consent was obtained from the patient after a discussion of the risks, benefits and alternatives to treatment. Preprocedural ultrasound scanning demonstrated an approximately 4.1 x 3.4 x 2.2 cm complex fluid collection within the deep tissues of the suboccipital portion of the right-side of the neck (images 3 and 6), correlating with the suspected abscess seen on preceding contrast-enhanced neck CT images 23, series 3. A timeout was performed prior to the initiation of the procedure. The the skin overlying the operative site was prepped and draped in the usual sterile fashion. The overlying soft tissues were anesthetized with 1% lidocaine with epinephrine. Under direct ultrasound guidance, a 18 gauge spinal was advanced into the abscess/fluid collection. Multiple ultrasound images were saved for procedural documentation purposes. Despite appropriate needle positioning, only approximately 5 cc of thick purulent fluid was able to be aspirated The collection was accessed in 2 separate locations yielding an additional 5 cc thick purulent fluid. All aspirated fluid was capped and sent to the laboratory for analysis. Superficial hemostasis was achieved with manual compression. A dressing was placed. The patient tolerated the procedure well without immediate postprocedural complication. IMPRESSION: Technically successful ultrasound-guided aspiration of a total of approximately 10 cc of very thick purulent fluid from right-sided sub occipital abscess. As a moderate amount of complex infection remains, definitive surgical debridement should be considered. Above recommendation was relayed to the referring surgeon at the time of procedure completion. Electronically Signed   By: Sandi Mariscal M.D.   On: 12/28/2020 15:38     LOS: 1 day   George Hugh,  MD [facility]  12/29/2020, 3:39 PM

## 2020-12-29 NOTE — Progress Notes (Signed)
Date of Admission:  12/27/2020      Subjective: Underwent incision and drainage of the abscess on the scalp which was deep this morning. She is feeling much better Swelling of the face is better.  No fever. She is upset that her dad is in the hospital.  Medications:   atorvastatin  40 mg Oral Q1200   buPROPion  150 mg Oral BID   calcium-vitamin D  2 tablet Oral Q breakfast   cholecalciferol  2,000 Units Oral Daily   folic acid  3 mg Oral Daily   insulin aspart  0-15 Units Subcutaneous Q4H   lisinopril  10 mg Oral Daily   multivitamin with minerals  1 tablet Oral Daily   omeprazole  40 mg Oral Daily    Objective: Vital signs in last 24 hours: Temp:  [96.9 F (36.1 C)-98.2 F (36.8 C)] 97.3 F (36.3 C) (07/09 1115) Pulse Rate:  [69-106] 86 (07/09 1109) Resp:  [10-25] 19 (07/09 1109) BP: (124-189)/(73-112) 166/79 (07/09 1108) SpO2:  [96 %-100 %] 99 % (07/09 1109)  PHYSICAL EXAM:  General: Alert, cooperative, emotional distress  Head: Occipital area abscess has been drained and is covered with dressing.  Swelling around is minimal  Eyes: Conjunctivae clear, anicteric sclerae. Pupils are equal ENT Nares normal. No drainage or sinus tenderness. Lips, mucosa, and tongue normal. No Thrush Facial swelling is reduced Facial skin around the eyes and in the scalp has scaly rash. Neck: Supple, symmetrical, no adenopathy, thyroid: non tender no carotid bruit and no JVD. Back: No CVA tenderness. Lungs: Clear to auscultation bilaterally. No Wheezing or Rhonchi. No rales. Heart: Regular rate and rhythm, no murmur, rub or gallop. Abdomen: Soft, non-tender,not distended. Bowel sounds normal. No masses Extremities: Bilateral hip scars Skin: Macular lesion in her left shin lymph: Cervical, supraclavicular normal. Neurologic: Grossly non-focal  Lab Results Recent Labs    12/28/20 0405 12/29/20 0828  WBC 10.3 7.8  HGB 11.4* 12.0  HCT 34.9* 37.1  NA 135 136  K 4.1 3.6  CL 106  106  CO2 25 26  BUN 14 8  CREATININE 0.49 0.48   Liver Panel Recent Labs    12/27/20 0816 12/29/20 0828  PROT 7.1 6.0*  ALBUMIN 4.2 3.5  AST 24 19  ALT 23 15  ALKPHOS 66 60  BILITOT 0.8 0.6  BILIDIR  --  <0.1  IBILI  --  NOT CALCULATED   Sedimentation Rate Recent Labs    12/29/20 0828  ESRSEDRATE 9   C-Reactive Protein No results for input(s): CRP in the last 72 hours.  Microbiology:  Studies/Results: [place] Abscess Drain  Result Date: 12/28/2020 INDICATION: Concern for right-sided sub occipital abscess. Please perform ultrasound-guided aspiration for diagnostic purposes. EXAM: ULTRASOUND GUIDED ABSCESS DRAINAGE COMPARISON:  Neck CT-12/27/2020 MEDICATIONS: The patient is currently admitted to the hospital and receiving intravenous antibiotics. The antibiotics were administered within an appropriate time frame prior to the initiation of the procedure. ANESTHESIA/SEDATION: Moderate (conscious) sedation was employed during this procedure. A total of Versed 2 mg and Fentanyl 100 mcg was administered intravenously. Moderate Sedation Time: 15 minutes. The patient's level of consciousness and vital signs were monitored continuously by radiology nursing throughout the procedure under my direct supervision. CONTRAST:  None COMPLICATIONS: None immediate. PROCEDURE: Informed written consent was obtained from the patient after a discussion of the risks, benefits and alternatives to treatment. Preprocedural ultrasound scanning demonstrated an approximately 4.1 x 3.4 x 2.2 cm complex fluid collection within the deep tissues of  the suboccipital portion of the right-side of the neck (images 3 and 6), correlating with the suspected abscess seen on preceding contrast-enhanced neck CT images 23, series 3. A timeout was performed prior to the initiation of the procedure. The the skin overlying the operative site was prepped and draped in the usual sterile fashion. The overlying soft tissues were anesthetized  with 1% lidocaine with epinephrine. Under direct ultrasound guidance, a 18 gauge spinal was advanced into the abscess/fluid collection. Multiple ultrasound images were saved for procedural documentation purposes. Despite appropriate needle positioning, only approximately 5 cc of thick purulent fluid was able to be aspirated The collection was accessed in 2 separate locations yielding an additional 5 cc thick purulent fluid. All aspirated fluid was capped and sent to the laboratory for analysis. Superficial hemostasis was achieved with manual compression. A dressing was placed. The patient tolerated the procedure well without immediate postprocedural complication. IMPRESSION: Technically successful ultrasound-guided aspiration of a total of approximately 10 cc of very thick purulent fluid from right-sided sub occipital abscess. As a moderate amount of complex infection remains, definitive surgical debridement should be considered. Above recommendation was relayed to the referring surgeon at the time of procedure completion. Electronically Signed   By: Sandi Mariscal M.D.   On: 12/28/2020 15:38     Assessment/Plan:   Abscess with surrounding cellulitis of the scalp in the right occipital area with underlying eczemaVS psoriasis Staph likely culprit.  She had IND this morning. Culture taken yesterday by IR is pending. Pt currently on vanco and unasyn-we will de-escalate once we have the culture result.  Rheumatoid arthritis on IL-6 inhibitor   Diabetes mellitus.  Currently on insulin.  Recent fall and fracture left ribs 4 and 5  Discussed with the patient in detail.

## 2020-12-29 NOTE — Anesthesia Procedure Notes (Signed)
Procedure Name: Intubation Date/Time: 12/29/2020 9:28 AM Performed by: Orion Crook, CRNA Pre-anesthesia Checklist: Patient identified, Emergency Drugs available, Suction available, Patient being monitored and Timeout performed Patient Re-evaluated:Patient Re-evaluated prior to induction Oxygen Delivery Method: Circle system utilized Preoxygenation: Pre-oxygenation with 100% oxygen Induction Type: IV induction Ventilation: Mask ventilation without difficulty Laryngoscope Size: 3 and McGraph Grade View: Grade II Tube type: Oral Tube size: 7.0 mm Number of attempts: 1 Placement Confirmation: ETT inserted through vocal cords under direct vision, positive ETCO2 and breath sounds checked- equal and bilateral Secured at: 22 cm Tube secured with: Tape Dental Injury: Teeth and Oropharynx as per pre-operative assessment

## 2020-12-29 NOTE — Progress Notes (Signed)
Neck dressing saturated with sanguineous drainage. Dr Peyton Najjar notified and dressing changed with gauze, packing in place

## 2020-12-29 NOTE — Anesthesia Postprocedure Evaluation (Signed)
Anesthesia Post Note  Patient: April Chaney  Procedure(s) Performed: INCISION AND DRAINAGE ABSCESS  Anesthesia Type: General Anesthetic complications: no   No notable events documented.   Last Vitals:  Vitals:   12/29/20 1109 12/29/20 1115  BP:    Pulse: 86   Resp: 19   Temp:  (!) 36.3 C  SpO2: 99%     Last Pain:  Vitals:   12/29/20 1115  TempSrc:   PainSc: 2                  Deno Etienne

## 2020-12-29 NOTE — Op Note (Signed)
Preoperative diagnosis: Posterior scalp abscess   Postoperative diagnosis: Posterior scalp abscess  Procedure: Incision and drainage of posterior scalp.                     Biopsy of posterior neck soft tissue mass  Anesthesia: GETA  Surgeon: Dr. Windell Moment, MD  Wound Classification: Contaminated  Indications: Patient is a 50 y.o. female  presented with an abscess on the on the posterior scalp that was not able to be drained percutaneously  Findings:  1. Abscess on the posterior nec 2. Abundant purulent secretions drained and cultured 3. Adequate hemostasis.   Description of procedure: The patient was placed in the prone position and general anesthesia was induced. The posterior neck and scalp were prepped and draped in the usual sterile fashion. A timeout was completed verifying correct patient, procedure, site, positioning, and implant(s) and/or special equipment prior to beginning this procedure.  A skin incision was made over the posterior scalp. The dissection was taken down until the abscess cavity was opened and purulent secretions was drained. This was very deep at the level of the occipital bone. With a hemostat blunt dissection of septas was done to be able to drain the abscess completely. The cavity was irrigated with saline. An excisional biopsy of soft tissue was taken to rule out malignancy. The cavity flushed with saline until clear saline was aspirated. The wound was packed with an iodine packing and partially closed with two interrupted nylon sutures. Sterile dressing left in place. Local anesthesia infiltrated away of the infected area to block the area.  The patient tolerated the procedure well and was taken to the postanesthesia care unit in satisfactory condition.   Specimen: Posterior scalp soft tissue  Complications: None  Estimated Blood Loss: 5 mL

## 2020-12-29 NOTE — Interval H&P Note (Signed)
History and Physical Interval Note:  12/29/2020 9:00 AM  April Chaney  has presented today for surgery, with the diagnosis of Posterior Scalp Abscess.  The various methods of treatment have been discussed with the patient and family. After consideration of risks, benefits and other options for treatment, the patient has consented to  Procedure(s): INCISION AND DRAINAGE ABSCESS (N/A) as a surgical intervention.  The patient's history has been reviewed, patient examined, no change in status, stable for surgery.  I have reviewed the patient's chart and labs.  Questions were answered to the patient's satisfaction.     Herbert Pun

## 2020-12-30 ENCOUNTER — Encounter: Payer: Self-pay | Admitting: General Surgery

## 2020-12-30 LAB — GLUCOSE, CAPILLARY
Glucose-Capillary: 172 mg/dL — ABNORMAL HIGH (ref 70–99)
Glucose-Capillary: 189 mg/dL — ABNORMAL HIGH (ref 70–99)
Glucose-Capillary: 220 mg/dL — ABNORMAL HIGH (ref 70–99)
Glucose-Capillary: 227 mg/dL — ABNORMAL HIGH (ref 70–99)

## 2020-12-30 LAB — BASIC METABOLIC PANEL
Anion gap: 6 (ref 5–15)
BUN: 14 mg/dL (ref 6–20)
CO2: 24 mmol/L (ref 22–32)
Calcium: 8.7 mg/dL — ABNORMAL LOW (ref 8.9–10.3)
Chloride: 106 mmol/L (ref 98–111)
Creatinine, Ser: 0.63 mg/dL (ref 0.44–1.00)
GFR, Estimated: >60 mL/min (ref >60)
Glucose, Bld: 193 mg/dL — ABNORMAL HIGH (ref 70–99)
Potassium: 3.9 mmol/L (ref 3.5–5.1)
Sodium: 136 mmol/L (ref 135–145)

## 2020-12-30 LAB — CBC WITH DIFFERENTIAL/PLATELET
Abs Immature Granulocytes: 0.06 10*3/uL (ref 0.00–0.07)
Basophils Absolute: 0 10*3/uL (ref 0.0–0.1)
Basophils Relative: 0 %
Eosinophils Absolute: 0.2 10*3/uL (ref 0.0–0.5)
Eosinophils Relative: 1 %
HCT: 35.5 % — ABNORMAL LOW (ref 36.0–46.0)
Hemoglobin: 11.9 g/dL — ABNORMAL LOW (ref 12.0–15.0)
Immature Granulocytes: 1 %
Lymphocytes Relative: 9 %
Lymphs Abs: 1.1 10*3/uL (ref 0.7–4.0)
MCH: 30.7 pg (ref 26.0–34.0)
MCHC: 33.5 g/dL (ref 30.0–36.0)
MCV: 91.7 fL (ref 80.0–100.0)
Monocytes Absolute: 0.8 10*3/uL (ref 0.1–1.0)
Monocytes Relative: 7 %
Neutro Abs: 9.5 10*3/uL — ABNORMAL HIGH (ref 1.7–7.7)
Neutrophils Relative %: 82 %
Platelets: 230 10*3/uL (ref 150–400)
RBC: 3.87 MIL/uL (ref 3.87–5.11)
RDW: 13.5 % (ref 11.5–15.5)
WBC: 11.6 10*3/uL — ABNORMAL HIGH (ref 4.0–10.5)
nRBC: 0 % (ref 0.0–0.2)

## 2020-12-30 MED ORDER — ATORVASTATIN CALCIUM 40 MG PO TABS: 40.0000 mg | ORAL_TABLET | Freq: Every day | ORAL | 11 refills | Status: DC

## 2020-12-30 MED ORDER — OXYCODONE HCL 5 MG PO TABS: 5.0000 mg | ORAL_TABLET | Freq: Three times a day (TID) | ORAL | 0 refills | Status: AC | PRN

## 2020-12-30 MED ORDER — ASPIRIN EC 81 MG PO TBEC: 81.0000 mg | DELAYED_RELEASE_TABLET | Freq: Every day | ORAL | 2 refills | Status: AC

## 2020-12-30 MED ORDER — SULFAMETHOXAZOLE-TRIMETHOPRIM 800-160 MG PO TABS: 1.0000 | ORAL_TABLET | Freq: Two times a day (BID) | ORAL | 0 refills | Status: DC

## 2020-12-30 MED ORDER — AMOXICILLIN-POT CLAVULANATE 875-125 MG PO TABS: 1.0000 | ORAL_TABLET | Freq: Two times a day (BID) | ORAL | 0 refills | Status: AC

## 2020-12-30 NOTE — Consult Note (Signed)
[facility] Nurse Consult Note: Reason for Consult: Teaching for home care dressing changes and frequency Wound type: I&D, infectious Pressure Injury POA:N/A  I have communicated with Dr. Lavera Guise via [place] to let her know that the Bedside RN would be the one to teach the patient or family the discharge wound care per the guidance of the surgeon performing the I&D, in this case, Dr. Windell Moment. Dr. Windell Moment and Bedside RN, Janann August were copied on the Secure Chat.  [facility] nursing team will not follow, but will remain available to this patient, the nursing and medical teams.  Please re-consult if needed. Thanks, Maudie Flakes, MSN, RN, [place], Arther Abbott  Pager# [phone]

## 2020-12-30 NOTE — Progress Notes (Signed)
Patient ID: April Chaney, female   DOB: 1971/06/05, 50 y.o.   MRN: [id number]     [facility] Day(s): 2.   Interval History: Patient seen and examined, no acute events or new complaints overnight. Patient reports feeling well. Localized pain. No fever.  Vital signs in last 24 hours: [min-max] current  Temp:  [96.9 F (36.1 C)-98.7 F (37.1 C)] 98.2 F (36.8 C) (07/10 0400) Pulse Rate:  [69-121] 96 (07/10 0400) Resp:  [18-24] 18 (07/10 0400) BP: (125-186)/(78-109) 161/98 (07/10 0400) SpO2:  [97 %-100 %] 100 % (07/10 0400)     Height: 5' (152.4 cm) Weight: 68 kg BMI (Calculated): 29.3   Physical Exam:  Constitutional: alert, cooperative and no distress  Neck: Wound is with serosanguinous drainage. No purulence.   Labs:  CBC Latest Ref Rng & Units 12/30/2020 12/29/2020 12/28/2020  WBC 4.0 - 10.5 K/uL 11.6(H) 7.8 10.3  Hemoglobin 12.0 - 15.0 g/dL 11.9(L) 12.0 11.4(L)  Hematocrit 36.0 - 46.0 % 35.5(L) 37.1 34.9(L)  Platelets 150 - 400 K/uL 230 215 214   CMP Latest Ref Rng & Units 12/30/2020 12/29/2020 12/28/2020  Glucose 70 - 99 mg/dL 193(H) 131(H) 148(H)  BUN 6 - 20 mg/dL 14 8 14   Creatinine 0.44 - 1.00 mg/dL 0.63 0.48 0.49  Sodium 135 - 145 mmol/L 136 136 135  Potassium 3.5 - 5.1 mmol/L 3.9 3.6 4.1  Chloride 98 - 111 mmol/L 106 106 106  CO2 22 - 32 mmol/L 24 26 25   Calcium 8.9 - 10.3 mg/dL 8.7(L) 8.4(L) 8.2(L)  Total Protein 6.5 - 8.1 g/dL - 6.0(L) -  Total Bilirubin 0.3 - 1.2 mg/dL - 0.6 -  Alkaline Phos 38 - 126 U/L - 60 -  AST 15 - 41 U/L - 19 -  ALT 0 - 44 U/L - 15 -    Imaging studies: No new pertinent imaging studies   Assessment/Plan:  50 y.o. female with neck abscess 1 Day Post-Op s/p incision and drainage, complicated by pertinent comorbidities including diabetes.   Wound is doing well. Source control achieved. From surgical standpoint, patient can be discharge with oral antibiotic therapy. I will follow at clinic in 2 weeks.   Arnold Long, MD

## 2020-12-30 NOTE — Discharge Summary (Signed)
Physician Discharge Summary  Sanora Cunanan MGQ:[id number] DOB: 1970/09/04 DOA: 12/27/2020  PCP: Marinda Elk, MD  Admit date: 12/27/2020 Discharge date: 12/30/2020  Admitted From: Home Disposition:  Home  Recommendations for Outpatient Follow-up:  Follow up with General Surgery Dr. Windell Moment in 1 week. Follow up with PCP in 1-2 weeks.  Please schedule appointment to check Echo. Please follow up with regular Rheumatologist and Dermatologist as needed.  Home Health:None Equipment/Devices: None  Discharge Condition: Stable Code Status:   Code Status: Full Code Diet recommendation:  Diet Order             Diet - low sodium heart healthy           Diet Carb Modified Fluid consistency: Thin; Room service appropriate? 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  CT shows 2.9 x 2.3 x 3.3 cn abscess in the right suboccipital area that extends into the carotid space.  Patient denies any trauma or surgeries or IV drug use.  IR attempted to drain abscess today but was unsuccessful.     Discharge Diagnoses:  Principal Problem:   Abscess Active Problems:   Benign essential hypertension   GERD without esophagitis   Nicotine dependence   Rheumatoid arthritis involving multiple sites with positive rheumatoid factor (HCC)  3 cm Posterior Neck Abscess that extends into the right carotid sheath - possibly secondary to oral cavity infection in the setting of immunocompromised state:  - 7/8 IR attempted to perform percutaneous drainage today but was unsuccessful due to dense contents of the abscess. - 7/8 GS performed I&D.   - She 2 days of IV Vancomycin and Unasyn post source control.    - Surgical cultures grew GBS. - Infectious Disease was consulted who recommended Augmentin 875 mg BID x 10 days.   - Patient will continue meticulous wound care and follow up with Dr. Windell Moment in 2 weeks.  Occipital Throbbing Headache: - Oxycodone PRN.   Rheumatoid Arthritis: - It is ok to resume Hydroxychloroquine.  Multiple Skin Lesions: - Follow up with regular Dermatologist.   History of Systolic Heart Failure with EF 35-40%: - Continue BB and ACE for GDMT and Lasix PRN. - Patient was advised to schedule appointment to follow up with PCP and check Echo.   Major Depressive Disorder: - Continue home meds.   History of Pancreatitis / Alcohol Use: - Abdomen is benign.   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  Consults: General Surgery Infectious Disease  Subjective: Patient was discharged in stable condition.  Discharge Exam: Vitals:   12/30/20 0400 12/30/20 1236  BP: (!) 161/98 (!) 119/101  Pulse: 96 89  Resp: 18 18  Temp: 98.2 F (36.8 C) 98 F (36.7 C)  SpO2: 100% 100%   General: Pt is alert, awake, not in acute distress Cardiovascular: RRR, S1/S2 +, no rubs, no gallops Respiratory: CTA bilaterally, no wheezing, no rhonchi Abdominal: Soft, NT, ND, bowel sounds + Extremities: no edema, no cyanosis  Discharge Instructions  Discharge Instructions     Ambulatory referral to General Surgery   Complete by: As directed    Change dressing (specify)   Complete by: As directed  Dressing change: 2 times per day using soap and water.   Diet - low sodium heart healthy   Complete by: As directed    Discharge instructions   Complete by: As directed    Please take Augmentin 875 mg twice daily for 10 days.    You can take Oxycodone 3 times daily as needed for pain.  Please keep wounds clean and perform dressing changes twice daily.  It is ok to wash hair but avoid shampoo exposure to the wound site.  Follow up with General Surgery Dr. Windell Moment  in 2 weeks.  Please follow up with your regular Primary Care Physician.  You need to schedule an appointment for an Echo.  Continue medications listed for diabetes and heart disease.  Please follow up with your Rheumatologist for management of Rheumatoid Arthritis.   Increase activity slowly   Complete by: As directed       Allergies as of 12/30/2020       Reactions   Pantoprazole Rash        Medication List     STOP taking these medications    doxycycline 100 MG capsule Commonly known as: VIBRAMYCIN   HYDROcodone-acetaminophen 5-325 MG tablet Commonly known as: Norco   ibuprofen 200 MG tablet Commonly known as: ADVIL       TAKE these medications    albuterol 108 (90 Base) MCG/ACT inhaler Commonly known as: VENTOLIN HFA Inhale 2 puffs into the lungs every 6 (six) hours as needed for wheezing or shortness of breath (8).   amoxicillin-clavulanate 875-125 MG tablet Commonly known as: Augmentin Take 1 tablet by mouth 2 (two) times daily for 10 days.   ARTIFICIAL TEAR OP Apply 1 drop to eye daily as needed (dry eyes).   aspirin EC 81 MG tablet Take 1 tablet (81 mg total) by mouth daily. Swallow whole.   atorvastatin 40 MG tablet Commonly known as: Lipitor Take 1 tablet (40 mg total) by mouth daily.   buPROPion 300 MG 24 hr tablet Commonly known as: WELLBUTRIN XL Take 300 mg by mouth daily.   CALCIUM CARBONATE-VITAMIN D PO Take 2 tablets by mouth daily with breakfast. 500 mg  2000 mg   diphenhydramine-acetaminophen 25-500 MG Tabs tablet Commonly known as: TYLENOL PM Take 2 tablets by mouth at bedtime as needed (sleep).   folic acid 1 MG tablet Commonly known as: FOLVITE Take 3 mg by mouth daily.   furosemide 20 MG tablet Commonly known as: LASIX Take 20 mg by mouth daily at 12 noon.   Kevzara 200 MG/1.14ML Soaj Generic drug: Sarilumab Inject into the skin.   leflunomide 20 MG tablet Commonly known as: ARAVA Take 20 mg by mouth daily.    lisinopril 20 MG tablet Commonly known as: ZESTRIL Take 20 mg by mouth daily.   meloxicam 15 MG tablet Commonly known as: MOBIC Take 15 mg by mouth daily.   metFORMIN 500 MG 24 hr tablet Commonly known as: GLUCOPHAGE-XR Take 1,000 mg by mouth daily with supper.   metoprolol succinate 50 MG 24 hr tablet Commonly known as: TOPROL-XL Take 50 mg by mouth daily.   multivitamin with minerals Tabs tablet Take 1 tablet by mouth daily.   naproxen 500 MG tablet Commonly known as: NAPROSYN Take 500 mg by mouth 2 (two) times daily.   omeprazole 40 MG capsule Commonly known as: PRILOSEC Take 40 mg by mouth 2 (two) times daily as needed.   oxyCODONE 5 MG immediate release tablet Commonly known as: Roxicodone Take  1 tablet (5 mg total) by mouth every 8 (eight) hours as needed for up to 5 days for severe pain.   Semglee (yfgn) 100 UNIT/ML Sopn Generic drug: Insulin Glargine-yfgn Inject 50 microcuries into the skin daily.   Tofacitinib Citrate 5 MG Tabs Take 5 mg by mouth 2 (two) times daily.   Turmeric 500 MG Tabs Take 500 mg by mouth daily.               Discharge Care Instructions  (From admission, onward)           Start     Ordered   12/30/20 0000  Change dressing (specify)       Comments: Dressing change: 2 times per day using soap and water.   12/30/20 1221            Follow-up Information     Herbert Pun, MD Follow up in 2 week(s).   Specialty: General Surgery Why: Follow up Neck abscess drainage., For suture removal Contact information: [place] [address] [phone]                Allergies  Allergen Reactions   Pantoprazole Rash    The results of significant diagnostics from this hospitalization (including imaging, microbiology, ancillary and laboratory) are listed below for reference.    Microbiology: Recent Results (from the past 240 hour(s))  Blood culture (single)     Status: None  (Preliminary result)   Collection Time: 12/27/20  8:16 AM   Specimen: BLOOD  Result Value Ref Range Status   Specimen Description BLOOD  LEFT FOREARM  Final   Special Requests   Final    BOTTLES DRAWN AEROBIC AND ANAEROBIC Blood Culture adequate volume   Culture   Final    NO GROWTH 3 DAYS Performed at [facility], [address], [place], [place] [address]    Report Status PENDING  Incomplete  Resp Panel by RT-PCR (Flu A&B, Covid) Nasopharyngeal Swab     Status: None   Collection Time: 12/27/20 11:26 AM   Specimen: Nasopharyngeal Swab; Nasopharyngeal(NP) swabs in vial transport medium  Result Value Ref Range Status   SARS Coronavirus 2 by RT PCR NEGATIVE NEGATIVE Final    Comment: (NOTE) SARS-CoV-2 target nucleic acids are NOT DETECTED.  The SARS-CoV-2 RNA is generally detectable in upper respiratory specimens during the acute phase of infection. The lowest concentration of SARS-CoV-2 viral copies this assay can detect is 138 copies/mL. A negative result does not preclude SARS-Cov-2 infection and should not be used as the sole basis for treatment or other patient management decisions. A negative result may occur with  improper specimen collection/handling, submission of specimen other than nasopharyngeal swab, presence of viral mutation(s) within the areas targeted by this assay, and inadequate number of viral copies(<138 copies/mL). A negative result must be combined with clinical observations, patient history, and epidemiological information. The expected result is Negative.  Fact Sheet for Patients:  EntrepreneurPulse.com.au  Fact Sheet for Healthcare Providers:  IncredibleEmployment.be  This test is no t yet approved or cleared by the [place] FDA and  has been authorized for detection and/or diagnosis of SARS-CoV-2 by FDA under an Emergency Use Authorization (EUA). This EUA will remain  in effect (meaning this test can  be used) for the duration of the COVID-19 declaration under Section 564(b)(1) of the Act, 21 U.S.C.section 360bbb-3(b)(1), unless the authorization is terminated  or revoked sooner.       Influenza A by  PCR NEGATIVE NEGATIVE Final   Influenza B by PCR NEGATIVE NEGATIVE Final    Comment: (NOTE) The Xpert Xpress SARS-CoV-2/FLU/RSV plus assay is intended as an aid in the diagnosis of influenza from Nasopharyngeal swab specimens and should not be used as a sole basis for treatment. Nasal washings and aspirates are unacceptable for Xpert Xpress SARS-CoV-2/FLU/RSV testing.  Fact Sheet for Patients: EntrepreneurPulse.com.au  Fact Sheet for Healthcare Providers: IncredibleEmployment.be  This test is not yet approved or cleared by the [place] FDA and has been authorized for detection and/or diagnosis of SARS-CoV-2 by FDA under an Emergency Use Authorization (EUA). This EUA will remain in effect (meaning this test can be used) for the duration of the COVID-19 declaration under Section 564(b)(1) of the Act, 21 U.S.C. section 360bbb-3(b)(1), unless the authorization is terminated or revoked.  Performed at [facility], [address] [place], [place] [address]   MRSA Next Gen by PCR, Nasal     Status: None   Collection Time: 12/27/20  7:09 PM   Specimen: Nasal Mucosa; Nasal Swab  Result Value Ref Range Status   MRSA by PCR Next Gen NOT DETECTED NOT DETECTED Final    Comment: (NOTE) The GeneXpert MRSA Assay (FDA approved for NASAL specimens only), is one component of a comprehensive MRSA colonization surveillance program. It is not intended to diagnose MRSA infection nor to guide or monitor treatment for MRSA infections. Test performance is not FDA approved in patients less than 73 years old. Performed at [facility], [address] [place], [place] [address]   Aerobic/Anaerobic Culture w Gram Stain (surgical/deep  wound)     Status: None (Preliminary result)   Collection Time: 12/28/20  2:49 PM   Specimen: Fluid; Abscess  Result Value Ref Range Status   Specimen Description   Final    FLUID RIGHT SUBOCCIPITAL Performed at [facility], [address] [place], [place] [address]    Special Requests   Final    NONE Performed at [facility], [address] [place], [place] [address]    Gram Stain   Final    ABUNDANT WBC PRESENT, PREDOMINANTLY PMN ABUNDANT GRAM POSITIVE COCCI IN PAIRS AND CHAINS    Culture   Final    ABUNDANT STREPTOCOCCUS AGALACTIAE TESTING AGAINST S. AGALACTIAE NOT ROUTINELY PERFORMED DUE TO PREDICTABILITY OF AMP/PEN/VAN SUSCEPTIBILITY. Performed at [facility], [place] [address] [place], [place] [address]    Report Status PENDING  Incomplete  Aerobic Culture w Gram Stain (superficial specimen)     Status: None (Preliminary result)   Collection Time: 12/28/20  6:39 PM   Specimen: Ear; Wound  Result Value Ref Range Status   Specimen Description   Final    EAR RIGHT Performed at [facility], [address], [place], [place] [address]    Special Requests   Final    NONE Performed at [facility], [address] [place], [place] [address]    Gram Stain NO WBC SEEN NO ORGANISMS SEEN   Final   Culture   Final    RARE STREPTOCOCCUS AGALACTIAE TESTING AGAINST S. AGALACTIAE NOT ROUTINELY PERFORMED DUE TO PREDICTABILITY OF AMP/PEN/VAN SUSCEPTIBILITY. Performed at [facility], [place] [address] [place],  [address]    Report Status PENDING  Incomplete  Aerobic Culture w Gram Stain (superficial specimen)     Status: None (Preliminary result)   Collection Time: 12/28/20  6:39 PM   Specimen: Leg  Result Value Ref  Range Status   Specimen Description   Final    LEG LEFT SHIN Performed at [facility], [address] [place], [place] [address]    Special Requests   Final    NONE Performed at [facility], [place], [place] [address]    Gram Stain NO WBC SEEN NO ORGANISMS SEEN   Final   Culture   Final    CULTURE REINCUBATED FOR BETTER GROWTH Performed at [facility], [place] [address], [place], [place] [address]    Report Status PENDING  Incomplete    Procedures/Studies: CT Head Wo Contrast  Result Date: 12/27/2020 CLINICAL DATA:  Painful knot on the back of the head for 2 weeks. EXAM: CT HEAD WITHOUT CONTRAST TECHNIQUE: Contiguous axial images were obtained from the base of the skull through the vertex without intravenous contrast. COMPARISON:  03/11/2011 FINDINGS: Brain: There is no evidence of an acute cortically based infarct, intracranial hemorrhage, mass, midline shift, or extra-axial fluid collection. There is new extensive confluent hypoattenuation throughout the cerebral white matter bilaterally as well as patchy hypodensity in the thalami. New focal subcentimeter hypodensities in the left basal ganglia are compatible with likely chronic lacunar infarcts. There has been mild interval cerebral volume loss. Vascular: Calcified atherosclerosis at the skull base. No hyperdense vessel. Skull: No fracture or suspicious osseous lesion. Sinuses/Orbits: Visualized paranasal sinuses and mastoid air cells are clear. Bilateral proptosis. Other: Partially visualized swelling in the posterior aspect of the upper neck on the right, more fully evaluated on today's neck CT. IMPRESSION: 1. New extensive cerebral white matter disease. 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 2. No evidence of acute cortically based infarct or intracranial hemorrhage. 3. Partially visualized posterior upper neck swelling, more fully evaluated on today's neck CT. Electronically Signed   By: Logan Bores M.D.   On: 12/27/2020 09:57   CT Soft Tissue Neck W  Contrast  Result Date: 12/27/2020 CLINICAL DATA:  Right-sided neck swelling. Painful knot for 2 weeks. EXAM: CT NECK WITH CONTRAST TECHNIQUE: Multidetector CT imaging of the neck was performed using the standard protocol following the bolus administration of intravenous contrast. CONTRAST:  69mL OMNIPAQUE IOHEXOL 300 MG/ML  SOLN COMPARISON:  None. FINDINGS: Pharynx and larynx: No evidence of mass or swelling. Patent airway. Mild right-sided retropharyngeal edema. Salivary glands: Bilateral submandibular gland atrophy. No salivary mass, stone, or acute inflammation identified. Thyroid: Unremarkable. Lymph nodes: No enlarged or suspicious lymph nodes in the neck. Vascular: Major vascular structures of the neck are grossly patent. Limited intracranial: More fully evaluated on today's head CT. Visualized orbits: Bilateral proptosis. No orbital mass or inflammation. Mastoids and visualized paranasal sinuses: Mild mucosal thickening in the left maxillary sinus. Clear mastoid air cells. Skeleton: No acute osseous abnormality or suspicious osseous lesion. Mild cervical spondylosis. Upper chest: Clear lung apices. Other: A rim enhancing low-density collection in the right suboccipital soft tissues measures 2.9 x 2.3 x 3.3 cm. There is extensive surrounding inflammation involving the right-sided posterior neck musculature and overlying subcutaneous fat with swelling extending laterally and inferiorly in the neck. There is also milder inflammation more anteriorly in the neck on the right including in the right carotid space. IMPRESSION: 3 cm right suboccipital abscess with extensive surrounding inflammation. Electronically Signed   By: Logan Bores M.D.   On: 12/27/2020 10:05   CT Angio Chest PE  W and/or Wo Contrast  Result Date: 12/21/2020 CLINICAL DATA:  Left upper quadrant pain nausea vomiting EXAM: CT ANGIOGRAPHY CHEST CT ABDOMEN AND PELVIS WITH CONTRAST TECHNIQUE: Multidetector CT imaging of the chest was performed  using the standard protocol during bolus administration of intravenous contrast. Multiplanar CT image reconstructions and MIPs were obtained to evaluate the vascular anatomy. Multidetector CT imaging of the abdomen and pelvis was performed using the standard protocol during bolus administration of intravenous contrast. CONTRAST:  168mL OMNIPAQUE IOHEXOL 350 MG/ML SOLN COMPARISON:  CT 12/24/2019, 12/09/2019, CT chest 02/17/2019 FINDINGS: CTA CHEST FINDINGS Cardiovascular: Satisfactory opacification of the pulmonary arteries to the segmental level. No evidence of pulmonary embolism. Mild cardiomegaly. No pericardial effusion. Nonaneurysmal aorta. No dissection seen. Mediastinum/Nodes: No enlarged mediastinal, hilar, or axillary lymph nodes. Thyroid gland, trachea, and esophagus demonstrate no significant findings. Lungs/Pleura: Lungs are clear. No pleural effusion or pneumothorax. Musculoskeletal: Age indeterminate left fourth and fifth lateral rib fractures. Review of the MIP images confirms the above findings. CT ABDOMEN and PELVIS FINDINGS Hepatobiliary: No focal liver abnormality is seen. No gallstones, gallbladder wall thickening, or biliary dilatation. Pancreas: No inflammatory changes. 8 mm hypodense lesion at the distal body of the pancreas appears decreased compared to CT from last year. Spleen: Normal in size without focal abnormality. Adrenals/Urinary Tract: Adrenal glands are unremarkable. Kidneys are normal, without renal calculi, focal lesion, or hydronephrosis. Bladder is unremarkable. Stomach/Bowel: Stomach is within normal limits. Appendix appears normal. No evidence of bowel wall thickening, distention, or inflammatory changes. Vascular/Lymphatic: No significant vascular findings are present. No enlarged abdominal or pelvic lymph nodes. Reproductive: IUD in the uterus.  No adnexal mass Other: No abdominal wall hernia or abnormality. No abdominopelvic ascites. Musculoskeletal: Bilateral hip  replacements with artifact. No acute osseous abnormality. Review of the MIP images confirms the above findings. IMPRESSION: 1. Negative for acute pulmonary embolus or aortic dissection. Clear lung fields. 2. No CT evidence for acute intra-abdominal or pelvic abnormality. 3. Age indeterminate left fourth and fifth lateral rib fractures 4. 8 mm hypodense lesion at the distal body of pancreas, decreased compared with CT from 2021. Electronically Signed   By: Donavan Foil M.D.   On: 12/21/2020 20:29   [place] Abscess Drain  Result Date: 12/28/2020 INDICATION: Concern for right-sided sub occipital abscess. Please perform ultrasound-guided aspiration for diagnostic purposes. EXAM: ULTRASOUND GUIDED ABSCESS DRAINAGE COMPARISON:  Neck CT-12/27/2020 MEDICATIONS: The patient is currently admitted to the hospital and receiving intravenous antibiotics. The antibiotics were administered within an appropriate time frame prior to the initiation of the procedure. ANESTHESIA/SEDATION: Moderate (conscious) sedation was employed during this procedure. A total of Versed 2 mg and Fentanyl 100 mcg was administered intravenously. Moderate Sedation Time: 15 minutes. The patient's level of consciousness and vital signs were monitored continuously by radiology nursing throughout the procedure under my direct supervision. CONTRAST:  None COMPLICATIONS: None immediate. PROCEDURE: Informed written consent was obtained from the patient after a discussion of the risks, benefits and alternatives to treatment. Preprocedural ultrasound scanning demonstrated an approximately 4.1 x 3.4 x 2.2 cm complex fluid collection within the deep tissues of the suboccipital portion of the right-side of the neck (images 3 and 6), correlating with the suspected abscess seen on preceding contrast-enhanced neck CT images 23, series 3. A timeout was performed prior to the initiation of the procedure. The the skin overlying the operative site was prepped and draped in  the usual sterile fashion. The overlying soft tissues were anesthetized with 1% lidocaine with epinephrine. Under  direct ultrasound guidance, a 18 gauge spinal was advanced into the abscess/fluid collection. Multiple ultrasound images were saved for procedural documentation purposes. Despite appropriate needle positioning, only approximately 5 cc of thick purulent fluid was able to be aspirated The collection was accessed in 2 separate locations yielding an additional 5 cc thick purulent fluid. All aspirated fluid was capped and sent to the laboratory for analysis. Superficial hemostasis was achieved with manual compression. A dressing was placed. The patient tolerated the procedure well without immediate postprocedural complication. IMPRESSION: Technically successful ultrasound-guided aspiration of a total of approximately 10 cc of very thick purulent fluid from right-sided sub occipital abscess. As a moderate amount of complex infection remains, definitive surgical debridement should be considered. Above recommendation was relayed to the referring surgeon at the time of procedure completion. Electronically Signed   By: Sandi Mariscal M.D.   On: 12/28/2020 15:38   CT ABDOMEN PELVIS W CONTRAST  Result Date: 12/21/2020 CLINICAL DATA:  Left upper quadrant pain nausea vomiting EXAM: CT ANGIOGRAPHY CHEST CT ABDOMEN AND PELVIS WITH CONTRAST TECHNIQUE: Multidetector CT imaging of the chest was performed using the standard protocol during bolus administration of intravenous contrast. Multiplanar CT image reconstructions and MIPs were obtained to evaluate the vascular anatomy. Multidetector CT imaging of the abdomen and pelvis was performed using the standard protocol during bolus administration of intravenous contrast. CONTRAST:  168mL OMNIPAQUE IOHEXOL 350 MG/ML SOLN COMPARISON:  CT 12/24/2019, 12/09/2019, CT chest 02/17/2019 FINDINGS: CTA CHEST FINDINGS Cardiovascular: Satisfactory opacification of the pulmonary arteries  to the segmental level. No evidence of pulmonary embolism. Mild cardiomegaly. No pericardial effusion. Nonaneurysmal aorta. No dissection seen. Mediastinum/Nodes: No enlarged mediastinal, hilar, or axillary lymph nodes. Thyroid gland, trachea, and esophagus demonstrate no significant findings. Lungs/Pleura: Lungs are clear. No pleural effusion or pneumothorax. Musculoskeletal: Age indeterminate left fourth and fifth lateral rib fractures. Review of the MIP images confirms the above findings. CT ABDOMEN and PELVIS FINDINGS Hepatobiliary: No focal liver abnormality is seen. No gallstones, gallbladder wall thickening, or biliary dilatation. Pancreas: No inflammatory changes. 8 mm hypodense lesion at the distal body of the pancreas appears decreased compared to CT from last year. Spleen: Normal in size without focal abnormality. Adrenals/Urinary Tract: Adrenal glands are unremarkable. Kidneys are normal, without renal calculi, focal lesion, or hydronephrosis. Bladder is unremarkable. Stomach/Bowel: Stomach is within normal limits. Appendix appears normal. No evidence of bowel wall thickening, distention, or inflammatory changes. Vascular/Lymphatic: No significant vascular findings are present. No enlarged abdominal or pelvic lymph nodes. Reproductive: IUD in the uterus.  No adnexal mass Other: No abdominal wall hernia or abnormality. No abdominopelvic ascites. Musculoskeletal: Bilateral hip replacements with artifact. No acute osseous abnormality. Review of the MIP images confirms the above findings. IMPRESSION: 1. Negative for acute pulmonary embolus or aortic dissection. Clear lung fields. 2. No CT evidence for acute intra-abdominal or pelvic abnormality. 3. Age indeterminate left fourth and fifth lateral rib fractures 4. 8 mm hypodense lesion at the distal body of pancreas, decreased compared with CT from 2021. Electronically Signed   By: Donavan Foil M.D.   On: 12/21/2020 20:29    Labs: BNP (last 3  results) Recent Labs    12/29/20 0828  BNP 810.1*   Basic Metabolic Panel: Recent Labs  Lab 12/27/20 0816 12/28/20 0405 12/29/20 0828 12/30/20 0639  NA 132* 135 136 136  K 4.0 4.1 3.6 3.9  CL 97* 106 106 106  CO2 24 25 26 24   GLUCOSE 211* 148* 131* 193*  BUN 19  14 8 14   CREATININE 0.72 0.49 0.48 0.63  CALCIUM 8.9 8.2* 8.4* 8.7*   Liver Function Tests: Recent Labs  Lab 12/27/20 0816 12/29/20 0828  AST 24 19  ALT 23 15  ALKPHOS 66 60  BILITOT 0.8 0.6  PROT 7.1 6.0*  ALBUMIN 4.2 3.5    CBC: Recent Labs  Lab 12/27/20 0816 12/28/20 0405 12/29/20 0828 12/30/20 0639  WBC 13.3* 10.3 7.8 11.6*  NEUTROABS 10.7*  --  5.8 9.5*  HGB 13.6 11.4* 12.0 11.9*  HCT 42.3 34.9* 37.1 35.5*  MCV 93.0 93.8 93.5 91.7  PLT 249 214 215 230   Cardiac Enzymes: Recent Labs  Lab 12/29/20 0828  CKTOTAL 49    CBG: Recent Labs  Lab 12/29/20 2131 12/30/20 0005 12/30/20 0412 12/30/20 0840 12/30/20 1239  GLUCAP 258* 220* 189* 172* 227*    Urinalysis    Component Value Date/Time   COLORURINE STRAW (A) 12/21/2020 1520   APPEARANCEUR CLEAR (A) 12/21/2020 1520   LABSPEC 1.009 12/21/2020 1520   PHURINE 5.0 12/21/2020 1520   GLUCOSEU 50 (A) 12/21/2020 1520   HGBUR NEGATIVE 12/21/2020 [place] 12/21/2020 1520   KETONESUR NEGATIVE 12/21/2020 1520   PROTEINUR NEGATIVE 12/21/2020 1520   UROBILINOGEN 0.2 12/04/2013 0759   NITRITE NEGATIVE 12/21/2020 1520   LEUKOCYTESUR NEGATIVE 12/21/2020 1520   Sepsis Labs Invalid input(s): PROCALCITONIN,  WBC,  LACTICIDVEN Microbiology Recent Results (from the past 240 hour(s))  Blood culture (single)     Status: None (Preliminary result)   Collection Time: 12/27/20  8:16 AM   Specimen: BLOOD  Result Value Ref Range Status   Specimen Description BLOOD  LEFT FOREARM  Final   Special Requests   Final    BOTTLES DRAWN AEROBIC AND ANAEROBIC Blood Culture adequate volume   Culture   Final    NO GROWTH 3 DAYS Performed  at [facility], [address] [place], [place] [address]    Report Status PENDING  Incomplete  Resp Panel by RT-PCR (Flu A&B, Covid) Nasopharyngeal Swab     Status: None   Collection Time: 12/27/20 11:26 AM   Specimen: Nasopharyngeal Swab; Nasopharyngeal(NP) swabs in vial transport medium  Result Value Ref Range Status   SARS Coronavirus 2 by RT PCR NEGATIVE NEGATIVE Final    Comment: (NOTE) SARS-CoV-2 target nucleic acids are NOT DETECTED.  The SARS-CoV-2 RNA is generally detectable in upper respiratory specimens during the acute phase of infection. The lowest concentration of SARS-CoV-2 viral copies this assay can detect is 138 copies/mL. A negative result does not preclude SARS-Cov-2 infection and should not be used as the sole basis for treatment or other patient management decisions. A negative result may occur with  improper specimen collection/handling, submission of specimen other than nasopharyngeal swab, presence of viral mutation(s) within the areas targeted by this assay, and inadequate number of viral copies(<138 copies/mL). A negative result must be combined with clinical observations, patient history, and epidemiological information. The expected result is Negative.  Fact Sheet for Patients:  EntrepreneurPulse.com.au  Fact Sheet for Healthcare Providers:  IncredibleEmployment.be  This test is no t yet approved or cleared by the [place] FDA and  has been authorized for detection and/or diagnosis of SARS-CoV-2 by FDA under an Emergency Use Authorization (EUA). This EUA will remain  in effect (meaning this test can be used) for the duration of the COVID-19 declaration under Section 564(b)(1) of the Act, 21 U.S.C.section 360bbb-3(b)(1), unless the authorization is terminated  or revoked sooner.  Influenza A by PCR NEGATIVE NEGATIVE Final   Influenza B by PCR NEGATIVE NEGATIVE Final    Comment: (NOTE) The  Xpert Xpress SARS-CoV-2/FLU/RSV plus assay is intended as an aid in the diagnosis of influenza from Nasopharyngeal swab specimens and should not be used as a sole basis for treatment. Nasal washings and aspirates are unacceptable for Xpert Xpress SARS-CoV-2/FLU/RSV testing.  Fact Sheet for Patients: EntrepreneurPulse.com.au  Fact Sheet for Healthcare Providers: IncredibleEmployment.be  This test is not yet approved or cleared by the [place] FDA and has been authorized for detection and/or diagnosis of SARS-CoV-2 by FDA under an Emergency Use Authorization (EUA). This EUA will remain in effect (meaning this test can be used) for the duration of the COVID-19 declaration under Section 564(b)(1) of the Act, 21 U.S.C. section 360bbb-3(b)(1), unless the authorization is terminated or revoked.  Performed at [facility], [address] [place], [place] [address]   MRSA Next Gen by PCR, Nasal     Status: None   Collection Time: 12/27/20  7:09 PM   Specimen: Nasal Mucosa; Nasal Swab  Result Value Ref Range Status   MRSA by PCR Next Gen NOT DETECTED NOT DETECTED Final    Comment: (NOTE) The GeneXpert MRSA Assay (FDA approved for NASAL specimens only), is one component of a comprehensive MRSA colonization surveillance program. It is not intended to diagnose MRSA infection nor to guide or monitor treatment for MRSA infections. Test performance is not FDA approved in patients less than 35 years old. Performed at [facility], [address] [place], [place] [address]   Aerobic/Anaerobic Culture w Gram Stain (surgical/deep wound)     Status: None (Preliminary result)   Collection Time: 12/28/20  2:49 PM   Specimen: Fluid; Abscess  Result Value Ref Range Status   Specimen Description   Final    FLUID RIGHT SUBOCCIPITAL Performed at [facility], [address] [place], [place] [address]    Special Requests   Final     NONE Performed at [facility], [address] [place], [place] [address]    Gram Stain   Final    ABUNDANT WBC PRESENT, PREDOMINANTLY PMN ABUNDANT GRAM POSITIVE COCCI IN PAIRS AND CHAINS    Culture   Final    ABUNDANT STREPTOCOCCUS AGALACTIAE TESTING AGAINST S. AGALACTIAE NOT ROUTINELY PERFORMED DUE TO PREDICTABILITY OF AMP/PEN/VAN SUSCEPTIBILITY. Performed at [facility], [place] [address] [place], [place] [address]    Report Status PENDING  Incomplete  Aerobic Culture w Gram Stain (superficial specimen)     Status: None (Preliminary result)   Collection Time: 12/28/20  6:39 PM   Specimen: Ear; Wound  Result Value Ref Range Status   Specimen Description   Final    EAR RIGHT Performed at [facility], [address], [place], [place] [address]    Special Requests   Final    NONE Performed at [facility], [address] [place], [place] [address]    Gram Stain NO WBC SEEN NO ORGANISMS SEEN   Final   Culture   Final    RARE STREPTOCOCCUS AGALACTIAE TESTING AGAINST S. AGALACTIAE NOT ROUTINELY PERFORMED DUE TO PREDICTABILITY OF AMP/PEN/VAN SUSCEPTIBILITY. Performed at [facility], [place] [address] [place],  [address]    Report Status PENDING  Incomplete  Aerobic Culture w Gram Stain (superficial specimen)     Status: None (Preliminary result)   Collection Time: 12/28/20  6:39 PM   Specimen: Leg  Result Value Ref Range Status   Specimen Description   Final    LEG LEFT SHIN Performed at [facility], [address] [place], [place] [address]    Special Requests   Final    NONE Performed at [facility], [place], [place] [address]    Gram Stain NO WBC SEEN NO ORGANISMS SEEN   Final   Culture   Final    CULTURE REINCUBATED FOR BETTER GROWTH Performed at [facility], [place] [address], [place], [place] [address]    Report Status PENDING  Incomplete     Time coordinating  discharge: > 30 minutes  SIGNED: George Hugh, MD  [facility] 12/30/2020, 4:45 PM  If 7PM-7AM, please contact night-coverage [email or web address]

## 2020-12-31 LAB — AEROBIC CULTURE W GRAM STAIN (SUPERFICIAL SPECIMEN): Gram Stain: NONE SEEN

## 2021-01-01 LAB — AEROBIC CULTURE W GRAM STAIN (SUPERFICIAL SPECIMEN): Gram Stain: NONE SEEN

## 2021-01-01 LAB — CULTURE, BLOOD (SINGLE)
Culture: NO GROWTH
Special Requests: ADEQUATE

## 2021-01-01 LAB — SURGICAL PATHOLOGY

## 2021-01-03 LAB — AEROBIC/ANAEROBIC CULTURE W GRAM STAIN (SURGICAL/DEEP WOUND)

## 2021-03-02 IMAGING — CT CT ABD-PELV W/ CM
2 of 6 series · 14 of 46 positions shown, 16 images · IV contrast (APPLIED)
Comparison: CT abdomen pelvis dated 12/09/2019.

CLINICAL DATA: Abdominal pain

EXAM:
CT ABDOMEN AND PELVIS WITH CONTRAST
TECHNIQUE: Multidetector CT imaging of the abdomen and pelvis was performed
using the standard protocol following bolus administration of
intravenous contrast.
CONTRAST:  100mL OMNIPAQUE IOHEXOL 300 MG/ML  SOLN

[Series 2: routine abd/pel with · axial · 0.75mm/px · z∈[-452,-72]mm · 11 of 88 slices shown, 13 images]
[im 6/88  soft-tissue]
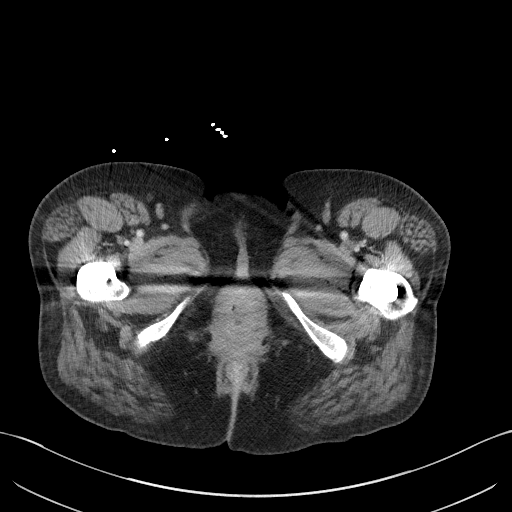
[im 6/88  bone]
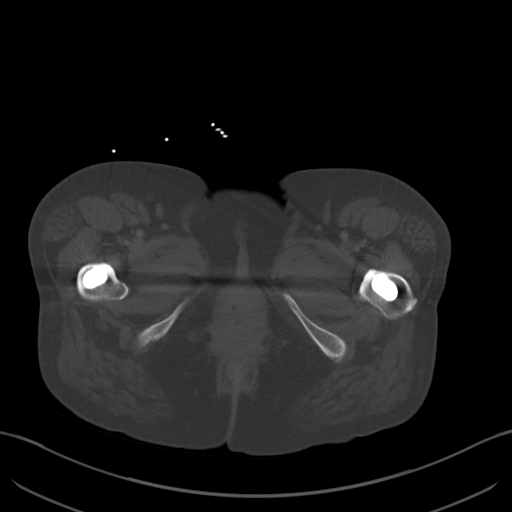
[im 12/88  soft-tissue]
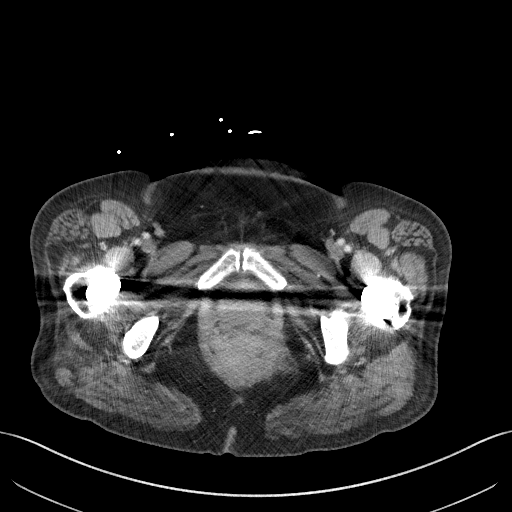
[im 24/88  soft-tissue]
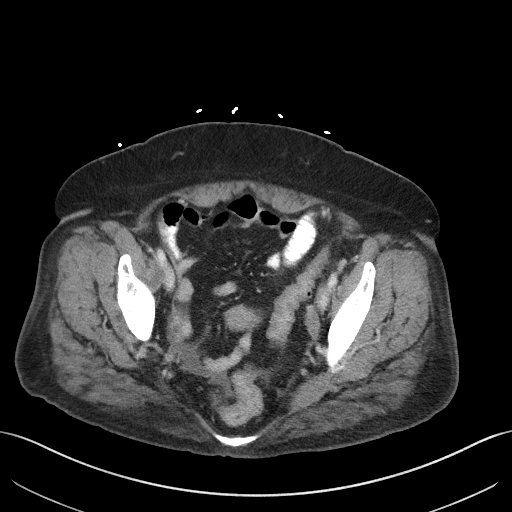
[im 30/88  soft-tissue]
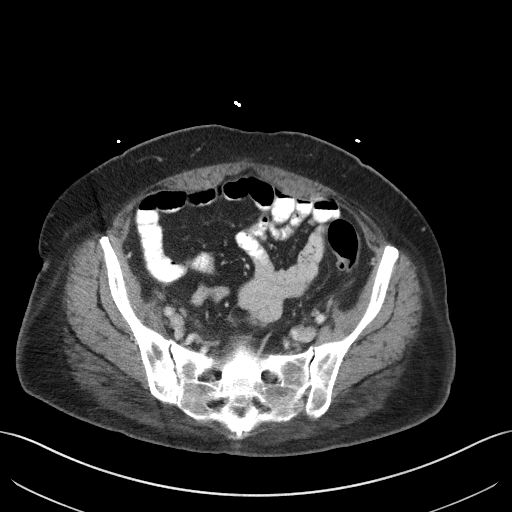
[im 35/88  soft-tissue]
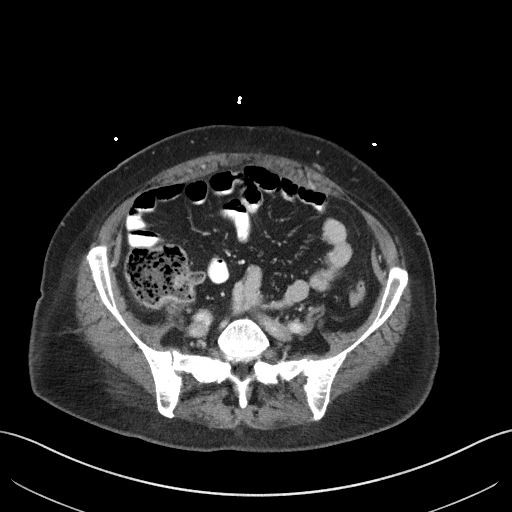
[im 47/88  soft-tissue]
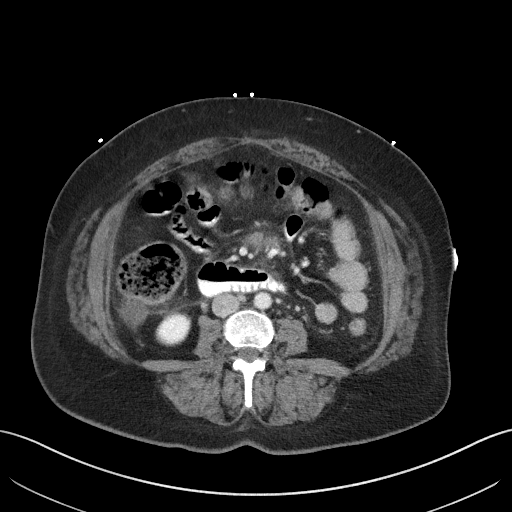
[im 53/88  soft-tissue]
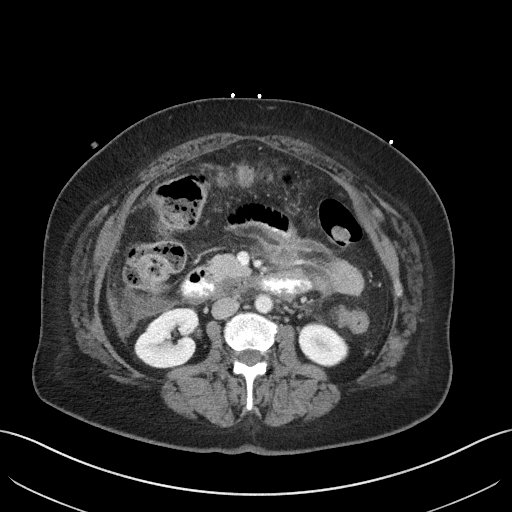
[im 59/88  soft-tissue]
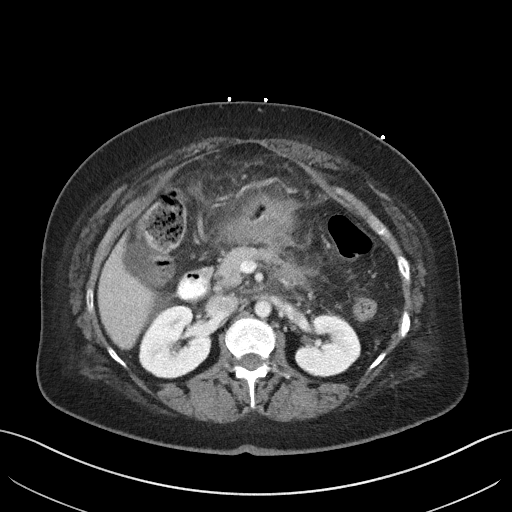
[im 64/88  soft-tissue]
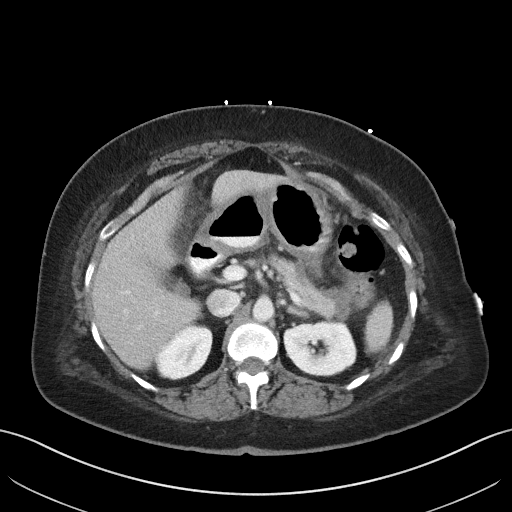
[im 64/88  bone]
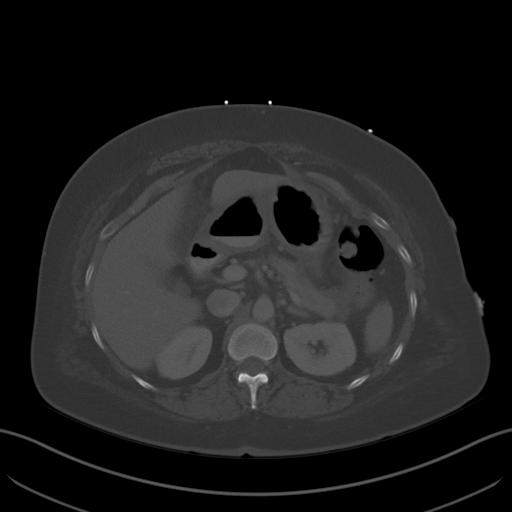
[im 76/88  soft-tissue]
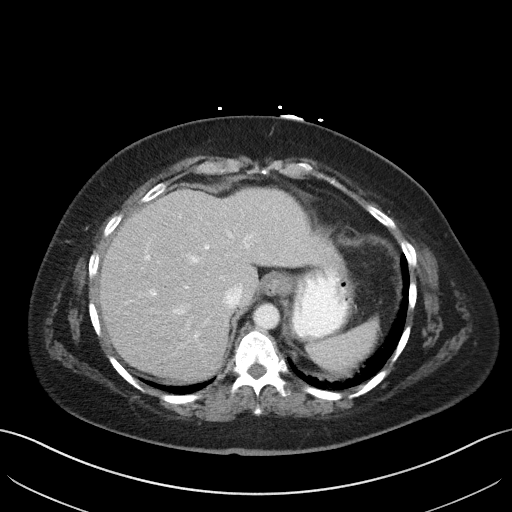
[im 82/88  soft-tissue]
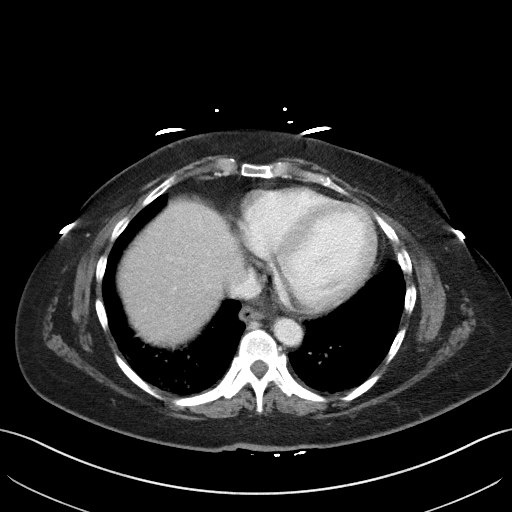

[Series 5: coronal st · coronal · 0.64mm/px · 3 of 85 slices shown]
[im 29/85  soft-tissue]
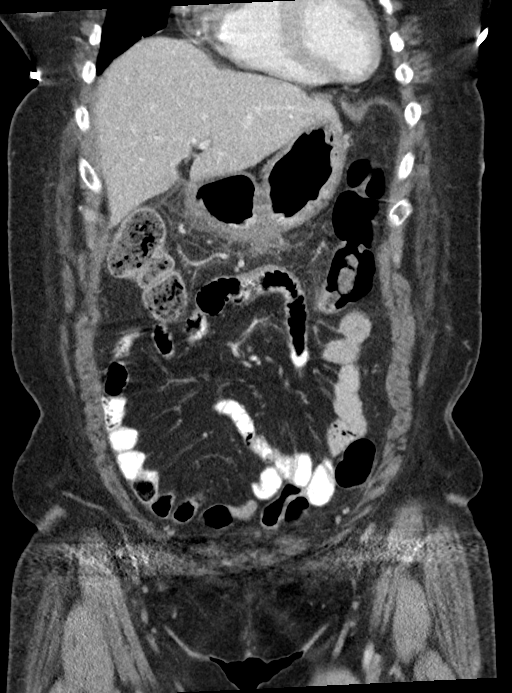
[im 38/85  soft-tissue]
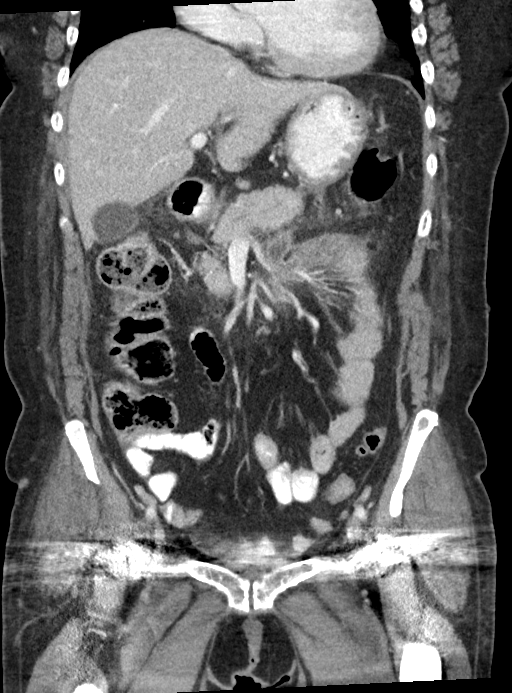
[im 47/85  soft-tissue]
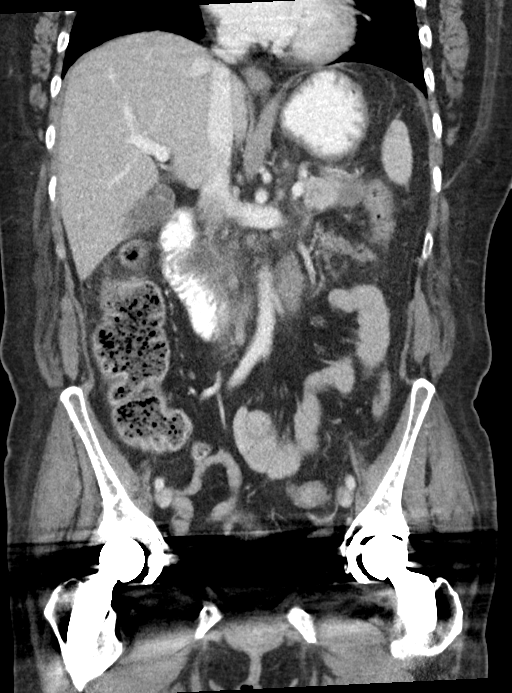

[14 of 46 positions shown; findings below may reference images not displayed]

FINDINGS: Lower chest: There is mild bibasilar atelectasis.

Hepatobiliary: No focal liver abnormality is seen. No gallstones,
gallbladder wall thickening, or biliary dilatation.

Pancreas: The pancreas enhances homogeneously with minimal
surrounding fat stranding. This likely represents late stage acute
interstitial edematous pancreatitis. A pseudocyst near the tail of
the pancreas measures 2.2 (series 2, image 25) x 2.3 x 1.8 cm
(series 6, image 78) and appears similar in size to 12/09/2019.
Fluid tracks from the head of the pancreas in the retroperitoneum
posterior to the second portion of the duodenum into the right
pericolic gutter. There is suggestion of a slight rim enhancement of
this fluid collection which may be new from prior exam and may
reflect early abscess formation, however the size of the collection
is not significantly changed. In the right pericolic gutter, the
portion of the fluid collection with possible rim enhancement
measures 3.2 x 1.5 (series 2, image 38) x 5.2 cm (series 6, image
28). Within the pancreatic body a 9 mm cystic lesion is not
significantly changed since at least 12/04/2013 and likely
represents a chronic pseudocyst. There is no pancreatic duct
dilatation. No evidence of pancreatic necrosis.

Spleen: Normal in size without focal abnormality.

Adrenals/Urinary Tract: Adrenal glands are unremarkable. Kidneys are
normal, without renal calculi, focal lesion, or hydronephrosis. The
urinary bladder is obscured due to streak artifact from bilateral
hip arthroplasties.

Stomach/Bowel: Enteric contrast reaches the ileum. A multiloculated
fluid collection with rim enhancement is centered in the root of the
mesentery is in continuity with the inferior aspect of the
pancreatic body (series 6, images 64-69). This is unchanged or
slightly decreased in size since 12/09/2019, however surrounding fat
stranding and inflammatory changes have increased. For reference a
portion of the fluid collection in the mesentery measures 3.1 x
(series 2, image 41) x 3.2 cm (series 6, image 61). Surrounding fat
stranding and inflammatory changes now involve the greater curvature
of the stomach, the transverse colon, the fourth portion of the
duodenum and proximal jejunum. There is bowel wall thickening of the
involved segments of bowel, likely reactive appendix appears normal.
There is mild colonic diverticulosis without evidence of
diverticulitis. No evidence of bowel obstruction.

Vascular/Lymphatic: No significant vascular findings are present. No
pseudoaneurysm or thrombosis is identified. No enlarged abdominal or
pelvic lymph nodes.

Reproductive: An intrauterine contraceptive device is noted.

Other: Small to moderate volume free intraperitoneal fluid,
increased since 12/09/2019. No abdominal wall hernia.

Musculoskeletal: Bilateral hip arthroplasties result in streak
artifact which obscures portions of the pelvis.
IMPRESSION: 1. Findings consistent with late stage acute interstitial edematous
pancreatitis with multiple pancreatic pseudocysts, some of which may
be infected. Rim enhancement associated with these fluid collections
as well as surrounding inflammatory changes centered in the root of
the mesentery have increased since 12/09/2019, however the size of
the pseudocysts is similar or slightly decreased.

## 2021-04-17 ENCOUNTER — Ambulatory Visit
Admission: RE | Admit: 2021-04-17 | Discharge: 2021-04-17 | Disposition: A | Discharge status: Home / Self Care | Payer: BC Managed Care – PPO | Source: Ambulatory Visit | Attending: Physician Assistant | Admitting: Physician Assistant

## 2021-04-17 ENCOUNTER — Other Ambulatory Visit: Payer: Self-pay

## 2021-04-17 ENCOUNTER — Other Ambulatory Visit: Payer: Self-pay | Admitting: Physician Assistant

## 2021-04-17 DIAGNOSIS — M25572 Pain in left ankle and joints of left foot: Secondary | ICD-10-CM | POA: Insufficient documentation

## 2021-04-17 DIAGNOSIS — Z1231 Encounter for screening mammogram for malignant neoplasm of breast: Secondary | ICD-10-CM

## 2021-05-27 ENCOUNTER — Emergency Department: Payer: BC Managed Care – PPO

## 2021-05-27 ENCOUNTER — Other Ambulatory Visit: Payer: Self-pay

## 2021-05-27 ENCOUNTER — Inpatient Hospital Stay
Admission: EM | Admit: 2021-05-27 | Discharge: 2021-06-05 | DRG: 872 | Disposition: A | Discharge status: Other Acute Inpatient Hospital | Payer: BC Managed Care – PPO | Attending: Internal Medicine | Admitting: Internal Medicine

## 2021-05-27 DIAGNOSIS — E1165 Type 2 diabetes mellitus with hyperglycemia: Secondary | ICD-10-CM | POA: Diagnosis present

## 2021-05-27 DIAGNOSIS — Z86010 Personal history of colonic polyps: Secondary | ICD-10-CM | POA: Diagnosis not present

## 2021-05-27 DIAGNOSIS — Z96643 Presence of artificial hip joint, bilateral: Secondary | ICD-10-CM | POA: Diagnosis present

## 2021-05-27 DIAGNOSIS — Z79899 Other long term (current) drug therapy: Secondary | ICD-10-CM | POA: Diagnosis not present

## 2021-05-27 DIAGNOSIS — F17210 Nicotine dependence, cigarettes, uncomplicated: Secondary | ICD-10-CM | POA: Diagnosis present

## 2021-05-27 DIAGNOSIS — M60052 Infective myositis, left thigh: Secondary | ICD-10-CM | POA: Diagnosis present

## 2021-05-27 DIAGNOSIS — Z833 Family history of diabetes mellitus: Secondary | ICD-10-CM

## 2021-05-27 DIAGNOSIS — L309 Dermatitis, unspecified: Secondary | ICD-10-CM | POA: Diagnosis present

## 2021-05-27 DIAGNOSIS — I10 Essential (primary) hypertension: Secondary | ICD-10-CM | POA: Diagnosis present

## 2021-05-27 DIAGNOSIS — L03116 Cellulitis of left lower limb: Secondary | ICD-10-CM

## 2021-05-27 DIAGNOSIS — Z7982 Long term (current) use of aspirin: Secondary | ICD-10-CM | POA: Diagnosis not present

## 2021-05-27 DIAGNOSIS — L409 Psoriasis, unspecified: Secondary | ICD-10-CM | POA: Diagnosis present

## 2021-05-27 DIAGNOSIS — G9340 Encephalopathy, unspecified: Secondary | ICD-10-CM | POA: Diagnosis not present

## 2021-05-27 DIAGNOSIS — E1110 Type 2 diabetes mellitus with ketoacidosis without coma: Secondary | ICD-10-CM

## 2021-05-27 DIAGNOSIS — L0390 Cellulitis, unspecified: Secondary | ICD-10-CM | POA: Diagnosis not present

## 2021-05-27 DIAGNOSIS — A4101 Sepsis due to Methicillin susceptible Staphylococcus aureus: Secondary | ICD-10-CM | POA: Diagnosis present

## 2021-05-27 DIAGNOSIS — A4901 Methicillin susceptible Staphylococcus aureus infection, unspecified site: Secondary | ICD-10-CM | POA: Diagnosis not present

## 2021-05-27 DIAGNOSIS — K219 Gastro-esophageal reflux disease without esophagitis: Secondary | ICD-10-CM | POA: Diagnosis present

## 2021-05-27 DIAGNOSIS — T8450XA Infection and inflammatory reaction due to unspecified internal joint prosthesis, initial encounter: Secondary | ICD-10-CM | POA: Diagnosis not present

## 2021-05-27 DIAGNOSIS — L02419 Cutaneous abscess of limb, unspecified: Secondary | ICD-10-CM

## 2021-05-27 DIAGNOSIS — Z888 Allergy status to other drugs, medicaments and biological substances status: Secondary | ICD-10-CM

## 2021-05-27 DIAGNOSIS — Z20822 Contact with and (suspected) exposure to covid-19: Secondary | ICD-10-CM | POA: Diagnosis present

## 2021-05-27 DIAGNOSIS — E876 Hypokalemia: Secondary | ICD-10-CM | POA: Diagnosis present

## 2021-05-27 DIAGNOSIS — M84452D Pathological fracture, left femur, subsequent encounter for fracture with routine healing: Secondary | ICD-10-CM | POA: Diagnosis present

## 2021-05-27 DIAGNOSIS — R6520 Severe sepsis without septic shock: Secondary | ICD-10-CM | POA: Diagnosis present

## 2021-05-27 DIAGNOSIS — E1169 Type 2 diabetes mellitus with other specified complication: Secondary | ICD-10-CM | POA: Diagnosis not present

## 2021-05-27 DIAGNOSIS — Z9181 History of falling: Secondary | ICD-10-CM | POA: Diagnosis not present

## 2021-05-27 DIAGNOSIS — L03119 Cellulitis of unspecified part of limb: Secondary | ICD-10-CM | POA: Diagnosis present

## 2021-05-27 DIAGNOSIS — M069 Rheumatoid arthritis, unspecified: Secondary | ICD-10-CM | POA: Diagnosis present

## 2021-05-27 DIAGNOSIS — Z7984 Long term (current) use of oral hypoglycemic drugs: Secondary | ICD-10-CM | POA: Diagnosis not present

## 2021-05-27 DIAGNOSIS — M84452A Pathological fracture, left femur, initial encounter for fracture: Secondary | ICD-10-CM | POA: Diagnosis present

## 2021-05-27 DIAGNOSIS — L02416 Cutaneous abscess of left lower limb: Secondary | ICD-10-CM | POA: Diagnosis present

## 2021-05-27 DIAGNOSIS — A419 Sepsis, unspecified organism: Secondary | ICD-10-CM

## 2021-05-27 DIAGNOSIS — M009 Pyogenic arthritis, unspecified: Secondary | ICD-10-CM

## 2021-05-27 DIAGNOSIS — Z8249 Family history of ischemic heart disease and other diseases of the circulatory system: Secondary | ICD-10-CM

## 2021-05-27 DIAGNOSIS — M60059 Infective myositis, unspecified thigh: Secondary | ICD-10-CM | POA: Diagnosis not present

## 2021-05-27 DIAGNOSIS — Z8781 Personal history of (healed) traumatic fracture: Secondary | ICD-10-CM

## 2021-05-27 LAB — URINALYSIS, COMPLETE (UACMP) WITH MICROSCOPIC
Bacteria, UA: NONE SEEN
Bilirubin Urine: NEGATIVE
Glucose, UA: >=500 mg/dL — AB
Hgb urine dipstick: NEGATIVE
Ketones, ur: NEGATIVE mg/dL
Leukocytes,Ua: NEGATIVE
Nitrite: NEGATIVE
Protein, ur: NEGATIVE mg/dL
Specific Gravity, Urine: 1.045 — ABNORMAL HIGH (ref 1.005–1.030)
pH: 5 (ref 5.0–8.0)

## 2021-05-27 LAB — COMPREHENSIVE METABOLIC PANEL
ALT: 15 U/L (ref 0–44)
AST: 17 U/L (ref 15–41)
Albumin: 3 g/dL — ABNORMAL LOW (ref 3.5–5.0)
Alkaline Phosphatase: 90 U/L (ref 38–126)
Anion gap: 12 (ref 5–15)
BUN: 32 mg/dL — ABNORMAL HIGH (ref 6–20)
CO2: 18 mmol/L — ABNORMAL LOW (ref 22–32)
Calcium: 9.1 mg/dL (ref 8.9–10.3)
Chloride: 97 mmol/L — ABNORMAL LOW (ref 98–111)
Creatinine, Ser: 0.94 mg/dL (ref 0.44–1.00)
GFR, Estimated: >60 mL/min (ref >60)
Glucose, Bld: 599 mg/dL (ref 70–99)
Potassium: 3.9 mmol/L (ref 3.5–5.1)
Sodium: 127 mmol/L — ABNORMAL LOW (ref 135–145)
Total Bilirubin: 0.8 mg/dL (ref 0.3–1.2)
Total Protein: 7.3 g/dL (ref 6.5–8.1)

## 2021-05-27 LAB — LACTIC ACID, PLASMA
Lactic Acid, Venous: 3.7 mmol/L (ref 0.5–1.9)
Lactic Acid, Venous: 2.1 mmol/L (ref 0.5–1.9)

## 2021-05-27 LAB — CBC WITH DIFFERENTIAL/PLATELET
Abs Immature Granulocytes: 0.68 10*3/uL — ABNORMAL HIGH (ref 0.00–0.07)
Basophils Absolute: 0 10*3/uL (ref 0.0–0.1)
Basophils Relative: 0 %
Eosinophils Absolute: 0 10*3/uL (ref 0.0–0.5)
Eosinophils Relative: 0 %
HCT: 37.9 % (ref 36.0–46.0)
Hemoglobin: 12.6 g/dL (ref 12.0–15.0)
Immature Granulocytes: 4 %
Lymphocytes Relative: 6 %
Lymphs Abs: 1.1 10*3/uL (ref 0.7–4.0)
MCH: 26.6 pg (ref 26.0–34.0)
MCHC: 33.2 g/dL (ref 30.0–36.0)
MCV: 80.1 fL (ref 80.0–100.0)
Monocytes Absolute: 0.6 10*3/uL (ref 0.1–1.0)
Monocytes Relative: 3 %
Neutro Abs: 16.6 10*3/uL — ABNORMAL HIGH (ref 1.7–7.7)
Neutrophils Relative %: 87 %
Platelets: 373 10*3/uL (ref 150–400)
RBC: 4.73 MIL/uL (ref 3.87–5.11)
RDW: 15.9 % — ABNORMAL HIGH (ref 11.5–15.5)
Smear Review: NORMAL
WBC: 18.9 10*3/uL — ABNORMAL HIGH (ref 4.0–10.5)
nRBC: 0 % (ref 0.0–0.2)

## 2021-05-27 LAB — BLOOD GAS, VENOUS
Acid-base deficit: 1.2 mmol/L (ref 0.0–2.0)
Bicarbonate: 22.4 mmol/L (ref 20.0–28.0)
O2 Saturation: 92.9 %
Patient temperature: 37
pCO2, Ven: 33 mmHg — ABNORMAL LOW (ref 44.0–60.0)
pH, Ven: 7.44 — ABNORMAL HIGH (ref 7.250–7.430)
pO2, Ven: 64 mmHg — ABNORMAL HIGH (ref 32.0–45.0)

## 2021-05-27 LAB — PROTIME-INR
INR: 1.5 — ABNORMAL HIGH (ref 0.8–1.2)
Prothrombin Time: 17.8 seconds — ABNORMAL HIGH (ref 11.4–15.2)

## 2021-05-27 LAB — RESP PANEL BY RT-PCR (FLU A&B, COVID) ARPGX2
Influenza A by PCR: NEGATIVE
Influenza B by PCR: NEGATIVE
SARS Coronavirus 2 by RT PCR: NEGATIVE

## 2021-05-27 LAB — CBG MONITORING, ED: Glucose-Capillary: 422 mg/dL — ABNORMAL HIGH (ref 70–99)

## 2021-05-27 LAB — BETA-HYDROXYBUTYRIC ACID: Beta-Hydroxybutyric Acid: 0.63 mmol/L — ABNORMAL HIGH (ref 0.05–0.27)

## 2021-05-27 MED ORDER — LISINOPRIL 20 MG PO TABS
20.0000 mg | ORAL_TABLET | Freq: Every day | ORAL | Status: DC
  Administered 2021-05-27 – 2021-06-05 (×10): 20 mg via ORAL
  Filled 2021-05-27: qty 2
  Filled 2021-05-27 (×8): qty 1
  Filled 2021-05-27: qty 2
  Filled 2021-05-27: qty 1

## 2021-05-27 MED ORDER — SODIUM CHLORIDE 0.9 % IV SOLN: INTRAVENOUS | Status: DC

## 2021-05-27 MED ORDER — TRAZODONE HCL 50 MG PO TABS
25.0000 mg | ORAL_TABLET | Freq: Every evening | ORAL | Status: DC | PRN
  Administered 2021-05-27 – 2021-06-03 (×7): 25 mg via ORAL
  Filled 2021-05-27 (×7): qty 1

## 2021-05-27 MED ORDER — ACETAMINOPHEN 650 MG RE SUPP: 650.0000 mg | Freq: Four times a day (QID) | RECTAL | Status: DC | PRN

## 2021-05-27 MED ORDER — INSULIN ASPART 100 UNIT/ML IJ SOLN
0.0000 [IU] | INTRAMUSCULAR | Status: DC
Start: 2021-05-27 — End: 2021-05-28
  Administered 2021-05-27: 20 [IU] via SUBCUTANEOUS
  Administered 2021-05-28 (×2): 3 [IU] via SUBCUTANEOUS
  Administered 2021-05-28 (×2): 4 [IU] via SUBCUTANEOUS
  Filled 2021-05-27 (×5): qty 1

## 2021-05-27 MED ORDER — MAGNESIUM HYDROXIDE 400 MG/5ML PO SUSP: 30.0000 mL | Freq: Every day | ORAL | Status: DC | PRN

## 2021-05-27 MED ORDER — INSULIN ASPART 100 UNIT/ML IJ SOLN: 0.0000 [IU] | Freq: Three times a day (TID) | INTRAMUSCULAR | Status: DC

## 2021-05-27 MED ORDER — VANCOMYCIN HCL 1500 MG/300ML IV SOLN
1500.0000 mg | Freq: Once | INTRAVENOUS | Status: AC
  Administered 2021-05-27: 1500 mg via INTRAVENOUS
  Filled 2021-05-27: qty 300

## 2021-05-27 MED ORDER — FUROSEMIDE 40 MG PO TABS: 20.0000 mg | ORAL_TABLET | Freq: Every day | ORAL | Status: DC

## 2021-05-27 MED ORDER — HYDROMORPHONE HCL 1 MG/ML IJ SOLN
0.5000 mg | Freq: Once | INTRAMUSCULAR | Status: AC
  Administered 2021-05-27: 0.5 mg via INTRAVENOUS
  Filled 2021-05-27: qty 1

## 2021-05-27 MED ORDER — LACTATED RINGERS IV BOLUS
2000.0000 mL | Freq: Once | INTRAVENOUS | Status: AC
  Administered 2021-05-27: 2000 mL via INTRAVENOUS

## 2021-05-27 MED ORDER — LEFLUNOMIDE 20 MG PO TABS
20.0000 mg | ORAL_TABLET | Freq: Every day | ORAL | Status: DC
  Administered 2021-05-27 – 2021-05-29 (×3): 20 mg via ORAL
  Filled 2021-05-27 (×4): qty 1

## 2021-05-27 MED ORDER — CLINDAMYCIN PHOSPHATE 600 MG/50ML IV SOLN
600.0000 mg | Freq: Once | INTRAVENOUS | Status: AC
  Administered 2021-05-27: 600 mg via INTRAVENOUS
  Filled 2021-05-27: qty 50

## 2021-05-27 MED ORDER — MORPHINE SULFATE (PF) 4 MG/ML IV SOLN
4.0000 mg | Freq: Once | INTRAVENOUS | Status: AC
  Administered 2021-05-27: 4 mg via INTRAVENOUS
  Filled 2021-05-27: qty 1

## 2021-05-27 MED ORDER — ONDANSETRON HCL 4 MG/2ML IJ SOLN: 4.0000 mg | Freq: Four times a day (QID) | INTRAMUSCULAR | Status: DC | PRN

## 2021-05-27 MED ORDER — ONDANSETRON HCL 4 MG PO TABS: 4.0000 mg | ORAL_TABLET | Freq: Four times a day (QID) | ORAL | Status: DC | PRN

## 2021-05-27 MED ORDER — ADULT MULTIVITAMIN W/MINERALS CH
1.0000 | ORAL_TABLET | Freq: Every day | ORAL | Status: DC
  Administered 2021-05-27 – 2021-06-05 (×10): 1 via ORAL
  Filled 2021-05-27 (×12): qty 1

## 2021-05-27 MED ORDER — METOPROLOL SUCCINATE ER 50 MG PO TB24
50.0000 mg | ORAL_TABLET | Freq: Every day | ORAL | Status: DC
  Administered 2021-05-27 – 2021-06-05 (×10): 50 mg via ORAL
  Filled 2021-05-27 (×11): qty 1

## 2021-05-27 MED ORDER — ACETAMINOPHEN 500 MG PO TABS
1000.0000 mg | ORAL_TABLET | Freq: Once | ORAL | Status: AC
  Administered 2021-05-27: 1000 mg via ORAL
  Filled 2021-05-27: qty 2

## 2021-05-27 MED ORDER — VANCOMYCIN HCL IN DEXTROSE 1-5 GM/200ML-% IV SOLN: 1000.0000 mg | INTRAVENOUS | Status: DC

## 2021-05-27 MED ORDER — IOHEXOL 300 MG/ML  SOLN
75.0000 mL | Freq: Once | INTRAMUSCULAR | Status: AC | PRN
  Administered 2021-05-27: 75 mL via INTRAVENOUS
  Filled 2021-05-27: qty 75

## 2021-05-27 MED ORDER — IOHEXOL 300 MG/ML  SOLN
100.0000 mL | Freq: Once | INTRAMUSCULAR | Status: AC | PRN
  Administered 2021-05-27: 100 mL via INTRAVENOUS
  Filled 2021-05-27: qty 100

## 2021-05-27 MED ORDER — PIPERACILLIN-TAZOBACTAM 3.375 G IVPB
3.3750 g | Freq: Three times a day (TID) | INTRAVENOUS | Status: DC
  Administered 2021-05-27 – 2021-05-30 (×9): 3.375 g via INTRAVENOUS
  Filled 2021-05-27 (×9): qty 50

## 2021-05-27 MED ORDER — PIPERACILLIN-TAZOBACTAM 3.375 G IVPB 30 MIN
3.3750 g | Freq: Once | INTRAVENOUS | Status: AC
  Administered 2021-05-27: 3.375 g via INTRAVENOUS
  Filled 2021-05-27: qty 50

## 2021-05-27 MED ORDER — ACETAMINOPHEN 325 MG PO TABS
650.0000 mg | ORAL_TABLET | Freq: Four times a day (QID) | ORAL | Status: DC | PRN
  Administered 2021-05-28 – 2021-06-05 (×12): 650 mg via ORAL
  Filled 2021-05-27 (×12): qty 2

## 2021-05-27 NOTE — ED Notes (Signed)
Images powershared to [facility] on diversion   powershared images to [facility]

## 2021-05-27 NOTE — ED Notes (Signed)
Per Bruce at [facility] transfer center, they are at capacity and cannot accept transfers  [phone]

## 2021-05-27 NOTE — ED Notes (Signed)
Dr. Starleen Blue aware of lactic acid of 3.7.

## 2021-05-27 NOTE — ED Provider Notes (Signed)
[facility] Emergency Department Provider Note ____________________________________________   Event Date/Time   First MD Initiated Contact with Patient 05/27/21 1136     (approximate)  I have reviewed the triage vital signs and the nursing notes.   HISTORY  Chief Complaint Leg Swelling  HPI April Chaney is a 50 y.o. female with history of bilateral hip replacement, diabetes, hypertension, remaining history as listed below presents to the emergency department for treatment and evaluation of nontraumatic left leg pain and swelling. Patient reports she fractured her ankle while in [place] in September.  Since that time, she has had continued left leg pain.  She was evaluated by [facility] in November and was diagnosed with trochanteric bursitis.  No relief with prescribed medications.  She states that her leg was somewhat swollen when she was evaluated at [facility] but not as much as it is now.  She was able to walk without a limp at that time.  For the past 5 days, pain has increased and swelling has increased as well. No relief with Voltaren gel.     Past Medical History:  Diagnosis Date   Arthritis    Colon polyps    Complication of anesthesia    woke up during 1 colonscopy 3 yrs ago   Diabetes mellitus without complication ([place])    type 2   DM (diabetes mellitus) ([place])    Family history of adverse reaction to anesthesia    mother slow to awaken after 1 procedure   GERD (gastroesophageal reflux disease)    Hypertension    hx of no bp meds for last 11 years after weight loss   Pancreatitis 3-4 yrs ago and feb 2018   Presence of artificial hip    has to take antibiotics prior to any procedure-dental, etc   Ulcer     Patient Active Problem List   Diagnosis Date Noted   Abscess 12/27/2020   Acute CHF (congestive heart failure) ([place]) 04/05/2019   Epigastric pain    Alcohol-induced acute pancreatitis    Abdominal pain 08/03/2016    Encounter for health maintenance examination 10/22/2015   Rheumatoid arthritis involving multiple sites with positive rheumatoid factor ([place]) 10/22/2015   BMI 35.0-35.9,adult 09/23/2015   Elevated rheumatoid factor 09/23/2015   History of adenomatous polyp of colon 09/23/2015   Alopecia 08/18/2015   Benign essential hypertension 08/18/2015   Benign neoplasm of colon 08/18/2015   Epigastric discomfort 08/18/2015   GERD without esophagitis 08/18/2015   Heartburn 08/18/2015   High risk medication use 08/18/2015   Hyperlipidemia 08/18/2015   Hyponatremia 08/18/2015   Infective otitis externa of left ear 08/18/2015   Insomnia 08/18/2015   Neuropathy of both feet 08/18/2015   Nicotine dependence 08/18/2015   Oligomenorrhea 08/18/2015   Obesity 08/18/2015   Gastro-esophageal reflux disease with esophagitis 08/18/2015   Stress incontinence in female 08/18/2015   Type 2 diabetes, uncontrolled, with neuropathy 08/18/2015   Uncontrolled diabetes mellitus with complications 28/36/6294   Vitamin D deficiency 08/18/2015   Wheezing 08/18/2015   Colon polyps 01/06/2014   Pancreatic mass 01/06/2014   Pancreatitis 01/06/2014    Past Surgical History:  Procedure Laterality Date   COLONOSCOPY WITH PROPOFOL N/A 02/11/2019   Procedure: COLONOSCOPY WITH PROPOFOL;  Surgeon: Lollie Sails, MD;  Location: [facility] ENDOSCOPY;  Service: Endoscopy;  Laterality: N/A;   colonscopy     x 4   ESOPHAGOGASTRODUODENOSCOPY (EGD) WITH PROPOFOL N/A 02/11/2019   Procedure: ESOPHAGOGASTRODUODENOSCOPY (EGD) WITH PROPOFOL;  Surgeon: Loistine Simas  U, MD;  Location: [facility] ENDOSCOPY;  Service: Endoscopy;  Laterality: N/A;   EUS N/A 01/01/2017   Procedure: UPPER ENDOSCOPIC ULTRASOUND (EUS) LINEAR;  Surgeon: Milus Banister, MD;  Location: [facility] ENDOSCOPY;  Service: Endoscopy;  Laterality: N/A;   FINE NEEDLE ASPIRATION  01/01/2017   Procedure: FINE NEEDLE ASPIRATION;  Surgeon: Milus Banister, MD;  Location: [facility] ENDOSCOPY;   Service: Endoscopy;;   INCISION AND DRAINAGE ABSCESS N/A 12/29/2020   Procedure: INCISION AND DRAINAGE ABSCESS;  Surgeon: Herbert Pun, MD;  Location: [facility] ORS;  Service: General;  Laterality: N/A;   JOINT REPLACEMENT     hip replacement   LEFT HEART CATH AND CORONARY ANGIOGRAPHY N/A 04/26/2019   Procedure: LEFT HEART CATH AND CORONARY ANGIOGRAPHY;  Surgeon: Isaias Cowman, MD;  Location: [facility] CV LAB;  Service: Cardiovascular;  Laterality: N/A;   STRABISMUS SURGERY  12/12/2011   Procedure: REPAIR STRABISMUS;  Surgeon: Derry Skill, MD;  Location: [facility];  Service: Ophthalmology;  Laterality: Right;   TOTAL HIP ARTHROPLASTY     bilat    Prior to Admission medications   Medication Sig Start Date End Date Taking? Authorizing Provider  albuterol (VENTOLIN HFA) 108 (90 Base) MCG/ACT inhaler Inhale 2 puffs into the lungs every 6 (six) hours as needed for wheezing or shortness of breath (8). 04/05/19   Arta Silence, MD  ARTIFICIAL TEAR OP Apply 1 drop to eye daily as needed (dry eyes).    [provider]  aspirin EC 81 MG tablet Take 1 tablet (81 mg total) by mouth daily. Swallow whole. 12/30/20 12/30/21  George Hugh, MD  atorvastatin (LIPITOR) 40 MG tablet Take 1 tablet (40 mg total) by mouth daily. 12/30/20 12/30/21  George Hugh, MD  buPROPion (WELLBUTRIN XL) 300 MG 24 hr tablet Take 300 mg by mouth daily.  01/03/16   [provider]  CALCIUM CARBONATE-VITAMIN D PO Take 2 tablets by mouth daily with breakfast. 500 mg  2000 mg    [provider]  diphenhydramine-acetaminophen (TYLENOL PM) 25-500 MG TABS tablet Take 2 tablets by mouth at bedtime as needed (sleep).    [provider]  folic acid (FOLVITE) 1 MG tablet Take 3 mg by mouth daily.    [provider]  furosemide (LASIX) 20 MG tablet Take 20 mg by mouth daily at 12 noon. 04/14/19   [provider]  leflunomide (ARAVA) 20 MG tablet Take  20 mg by mouth daily. 12/19/19   [provider]  lisinopril (ZESTRIL) 20 MG tablet Take 20 mg by mouth daily. 10/29/20   [provider]  meloxicam (MOBIC) 15 MG tablet Take 15 mg by mouth daily. 07/10/20   [provider]  metFORMIN (GLUCOPHAGE-XR) 500 MG 24 hr tablet Take 1,000 mg by mouth daily with supper. 05/21/20   [provider]  metoprolol succinate (TOPROL-XL) 50 MG 24 hr tablet Take 50 mg by mouth daily. 07/31/20   [provider]  Multiple Vitamin (MULTIVITAMIN WITH MINERALS) TABS Take 1 tablet by mouth daily.    [provider]  naproxen (NAPROSYN) 500 MG tablet Take 500 mg by mouth 2 (two) times daily. 12/26/20   [provider]  omeprazole (PRILOSEC) 40 MG capsule Take 40 mg by mouth 2 (two) times daily as needed.    [provider]  Sarilumab [facility]) 200 MG/1.14ML SOAJ Inject into the skin. 11/12/20   [provider]  SEMGLEE, YFGN, 100 UNIT/ML SOPN Inject 50 microcuries into the skin daily. 12/03/20  [provider]  Tofacitinib Citrate 5 MG TABS Take 5 mg by mouth 2 (two) times daily.     [provider]  Turmeric 500 MG TABS Take 500 mg by mouth daily.    [provider]    Allergies Pantoprazole  Family History  Problem Relation Age of Onset   Diabetes Mother    Diabetes Father    Hypertension Father     Social History Social History   Tobacco Use   Smoking status: Some Days    Packs/day: 0.50    Types: Cigarettes   Smokeless tobacco: Never  Vaping Use   Vaping Use: Never used  Substance Use Topics   Alcohol use: Yes    Comment: occ   Drug use: No    Review of Systems  Constitutional: No fever/chills Eyes: No visual changes. ENT: No sore throat. Cardiovascular: Denies chest pain. Respiratory: Denies shortness of breath. Gastrointestinal: No abdominal pain.  No nausea, no vomiting.   Genitourinary: Negative for dysuria. Musculoskeletal: Negative for  back pain. Skin: Negative for rash. Neurological: Negative for headaches, focal weakness or numbness. ____________________________________________   PHYSICAL EXAM:  VITAL SIGNS: ED Triage Vitals [05/27/21 0820]  Enc Vitals Group     BP 134/88     Pulse Rate 89     Resp 18     Temp (!) 97.5 F (36.4 C)     Temp Source Oral     SpO2 99 %     Weight 140 lb (63.5 kg)     Height 5' (1.524 m)     Head Circumference      Peak Flow      Pain Score 9     Pain Loc      Pain Edu?      Excl. in [place]?     Constitutional: Alert and oriented. Chronically ill appearing and in no acute distress. Eyes: Conjunctivae are normal. PERRL. EOMI. Head: Atraumatic. Nose: No congestion/rhinnorhea. Mouth/Throat: Mucous membranes are moist.  Oropharynx non-erythematous. Neck: No stridor.   Hematological/Lymphatic/Immunilogical: No cervical lymphadenopathy. Cardiovascular: Normal rate, regular rhythm. Grossly normal heart sounds.  Good peripheral circulation. Respiratory: Normal respiratory effort.  No retractions. Lungs CTAB. Gastrointestinal: Soft and nontender. No distention. No abdominal bruits. No CVA tenderness. Genitourinary:  Musculoskeletal: No lower extremity tenderness nor edema.  No joint effusions. Neurologic:  Normal speech and language. No gross focal neurologic deficits are appreciated. No gait instability. Skin:  Skin is warm, dry and intact. No rash noted.  No ecchymosis over the left lower extremity.  No focal abscess, area of fluctuance, or induration. Thigh is firm. Psychiatric: Mood and affect are normal. Speech and behavior are normal.  ____________________________________________   LABS (all labs ordered are listed, but only abnormal results are displayed)  Labs Reviewed - No data to display ____________________________________________  EKG  Not indicated. ____________________________________________  RADIOLOGY  ED MD interpretation:    Ultrasound of the left  lower extremity negative for DVT, however there is a complex fluid collection that extends from the groin to just above the left knee.  I, Sherrie George, personally viewed and evaluated these images (plain radiographs) as part of my medical decision making, as well as reviewing the written report by the radiologist.  Official radiology report(s): US Venous Img Lower Unilateral Left  Result Date: 05/27/2021 CLINICAL DATA:  Swelling left lower extremity EXAM: LEFT LOWER EXTREMITY VENOUS DOPPLER ULTRASOUND TECHNIQUE: Gray-scale sonography with compression, as well as color and duplex ultrasound, were performed to evaluate  the deep venous system(s) from the level of the common femoral vein through the popliteal and proximal calf veins. COMPARISON:  None. FINDINGS: VENOUS Normal compressibility of the common femoral, superficial femoral, and popliteal veins, as well as the visualized calf veins. Visualized portions of profunda femoral vein and great saphenous vein unremarkable. No filling defects to suggest DVT on grayscale or color Doppler imaging. Doppler waveforms show normal direction of venous flow, normal respiratory plasticity and response to augmentation. Limited views of the contralateral common femoral vein are unremarkable. OTHER Large complex fluid collection seen extending from the left groin to just above the left knee measuring 11.3 x 6.4 x 3.6 cm. Limitations: none IMPRESSION: No evidence of left lower extremity DVT. Complex fluid collection extending from the left groin to just above the left knee, question hematoma. This could be further evaluated with CT (preferably with IV contrast) if felt clinically indicated. Electronically Signed   By: Rolm Baptise M.D.   On: 05/27/2021 10:20    ____________________________________________   PROCEDURES  Procedure(s) performed (including Critical Care):  Procedures  ____________________________________________   INITIAL IMPRESSION / ASSESSMENT  AND PLAN     50 year old presents to the emergency department for treatment and evaluation of pain and swelling in the left thigh. See HPI. Will get labs. US of the left lower extremity is negative for DVT but shows complex fluid collection from groin to knee. CT imaging required.   DIFFERENTIAL DIAGNOSIS  Abscess; Cellulitis; DVT   ED COURSE  Awaiting remainder of labs to result. Leukocytosis on CBC. Care transitioned to Dr. Starleen Blue who will follow to disposition.  Clinical Course as of 05/31/21 1102  Mon May 27, 2021  1454 Ketones, ur: NEGATIVE [KM]    Clinical Course User Index [KM] Rada Hay, MD    As part of my medical decision making, I reviewed the following data within the electronic MEDICAL RECORD NUMBER Notes from prior ED visits  ___________________________________________   FINAL CLINICAL IMPRESSION(S) / ED DIAGNOSES  Cellulitis left thigh.  Final diagnoses:  None     ED Discharge Orders     None        Rhyan Cordrey was evaluated in Emergency Department on 05/27/2021 for the symptoms described in the history of present illness. She was evaluated in the context of the global COVID-19 pandemic, which necessitated consideration that the patient might be at risk for infection with the SARS-CoV-2 virus that causes COVID-19. Institutional protocols and algorithms that pertain to the evaluation of patients at risk for COVID-19 are in a state of rapid change based on information released by regulatory bodies including the CDC and federal and state organizations. These policies and algorithms were followed during the patient's care in the ED.   Note:  This document was prepared using Dragon voice recognition software and may include unintentional dictation errors.    Victorino Dike, FNP 05/31/21 1118    Rada Hay, MD 06/03/21 [phone]

## 2021-05-27 NOTE — ED Notes (Signed)
Pt to ED for L leg swelling and pain ongoing since 1 week. Pt unable to walk on it. Uses cane at home. Leg does appear visibly swollen. States pain is in entire L leg. Assisted pt tp bathroom using walker.

## 2021-05-27 NOTE — ED Triage Notes (Signed)
Pt in with co left sided groin pain that radiates into left anterior thigh. Significant swelling noted states 4 days ago. States saw ortho last week and xray was negative. Pt denies any hx of DVT's.

## 2021-05-27 NOTE — ED Notes (Signed)
Received call from [facility]) , at this time they are at capacity and have no space availible

## 2021-05-27 NOTE — ED Notes (Signed)
[facility] [place]) spoke w/ [place]. [place] then spoke w/ Jari Pigg, MD. Avis Epley and power shared images

## 2021-05-27 NOTE — ED Provider Notes (Signed)
4:09 PM Assumed care for off going team.   Blood pressure 121/81, pulse (!) 126, temperature 99.2 F (37.3 C), temperature source Oral, resp. rate (!) 22, height 5' (1.524 m), weight 63.5 kg, SpO2 99 %.  See their HPI for full report but in brief pending transfer  4:29 PM [facility] closed for transfer   D/w - [place] from IR not emergent will not come in for it but it would be done in AM tomorrow if admit   5:22 PM Got [facility] hold of [facility] waiting to talk to providers.   6:11 PM  Talked to [facility] no beds for a few days- waiting to talk to provider to get on waitlist at [facility].   Patient spiked a fever and was given Tylenol.  Reevaluated patient and given her some pain medicine to help with the pain.  She continues to have the cellulitis on the left leg.  6:17 PM Asked Dr. Lysle Pearl from the surgical team and he stated that this would not be something that he would be able to operate on given it tracks all the way down to the hip and would need to have the hardware from that dealt with by an orthopedic doctor.  6:41 PM discussed the case with Dr. From [facility] who stated that they want to try to do ED to ED.  However then it was declined stating that this does not need emergent management.  Orthopedics there is saying that it would not need an orthopedic doctor for a few days until infections been treated with IV antibiotics.  My concern is that this is an abscess that needs to be drained. I have asked to talk to ortho doctor and hospitalist doctor   8:19 PM Discussed with Dr. Lovena Le from a [facility] hospitalist who states that they have involved orthopedics again with the [facility] and they are continuing declined transfer stating that patient does not need emergent orthopedic surgery and they would recommend just IR drain placement and IV antibiotics.  8:41 PM [facility] was also at capacity and declines transport.  Discussed the case again with Dr. Posey Pronto who recommends admission here and IR drainage tomorrow.   Messaged IR doc but I had discussed with IR doc Dr. Maryelizabeth Kaufmann earlier who stated this would need to be done in AM and not emergently over night.   Admit to hospital    .Critical Care Performed by: Vanessa Bedias, MD Authorized by: Vanessa Tyler, MD   Critical care provider statement:    Critical care time (minutes):  45   Critical care was necessary to treat or prevent imminent or life-threatening deterioration of the following conditions:  Sepsis   Critical care was time spent personally by me on the following activities:  Development of treatment plan with patient or surrogate, discussions with consultants, evaluation of patient's response to treatment, examination of patient, ordering and review of laboratory studies, ordering and review of radiographic studies, ordering and performing treatments and interventions, pulse oximetry, re-evaluation of patient's condition and review of old charts                Vanessa [place], MD 05/27/21 2059

## 2021-05-27 NOTE — ED Provider Notes (Addendum)
Patient signed out to me at 51 PM.  50 year old female history of diabetes presenting with left leg pain x2 weeks.  DVT study was concerning for large fluid collection along the thigh radiology recommended CT with contrast.  At the time of signout she is pending CT and labs.  Patient has a white count of 18 and is notably hyperglycemic with a blood sugar about 600.  Her bicarb is low at 18 but she has a normal anion gap, no ketones in her urine so low suspicion for DKA.  We will send a beta hydroxybutyrate.  Lactate also pending.  I discussed the case with Dr. Posey Pronto who will review the imaging when he is out of the OR to see if she will need surgery.  Covered with vanco, Zosyn and clindamycin.  Cultures have been sent.  Spoke with Dr. Posey Pronto who reviewed the CT imaging and notes that there is extensive infection throughout the anterior compartment of the hip and that her total hip arthroplasty on the left will need to be removed and she will likely need extensive reconstructive surgery.  Unfortunately they are not capable of doing this at [facility] and he recommends transfer to [facility] or [facility].  He did say that if unable to transfer he would recommend IR aspiration for cultures to guide antibiotic management and she could be admitted here and potentially transferred after she is more stable.  [facility] contacted and is close to medicine transfers.  We will attempt [facility] and [facility].  Also discussed with the hospitalist potential for admission.  Will discuss with IR as well.  Patient signed out to oncoming provider pending attempted transfer.   Rada Hay, MD 05/27/21 1537    Rada Hay, MD 05/27/21 [phone]

## 2021-05-27 NOTE — ED Notes (Signed)
[facility]   for transfer  [phone]

## 2021-05-27 NOTE — ED Notes (Signed)
Spoke to [employer] for transfer   [employer]  [phone]

## 2021-05-27 NOTE — ED Notes (Signed)
Dr. Starleen Blue aware of glucose of 599

## 2021-05-27 NOTE — Consult Note (Signed)
Pharmacy Antibiotic Note  April Chaney is a 50 y.o. female admitted on 05/27/2021 with cellulitis.  Pharmacy has been consulted for Vancomycin dosing. Patient also received 1x doses of Zosyn and Clindamycin in the ED.  Plan: Vancomycin 1000 mg IV Q 24 hrs. Goal AUC 400-550. Expected AUC: 469.2 Expected Css: 11.1 SCr used: 0.94   Height: 5' (152.4 cm) Weight: 63.5 kg (140 lb) IBW/kg (Calculated) : 45.5  Temp (24hrs), Avg:99 F (37.2 C), Min:97.5 F (36.4 C), Max:100.2 F (37.9 C)  Recent Labs  Lab 05/27/21 1210 05/27/21 1414 05/27/21 1628  WBC 18.9*  --   --   CREATININE 0.94  --   --   LATICACIDVEN  --  3.7* 2.1*    Estimated Creatinine Clearance: 58.9 mL/min (by C-G formula based on SCr of 0.94 mg/dL).    Allergies  Allergen Reactions   Pantoprazole Rash    Antimicrobials this admission: Vancomycin 12/5 >>  Zoxyn/Clinda 12/5 x1  Microbiology results: 12/5 BCx: pending  Thank you for allowing pharmacy to be a part of this patient's care.  Bertice Risse A Gwendlyon Zumbro 05/27/2021 10:02 PM

## 2021-05-27 NOTE — ED Notes (Signed)
Lab at bedside to collect blood cultures and blood work

## 2021-05-27 NOTE — H&P (Signed)
[place]   PATIENT NAME: April Chaney    MR#:  [id number]  DATE OF BIRTH:  [birth year]  DATE OF ADMISSION:  05/27/2021  PRIMARY CARE PHYSICIAN: Marinda Elk, MD   Patient is coming from: Home  REQUESTING/REFERRING PHYSICIAN: Marjean Donna, MD  CHIEF COMPLAINT:   Chief Complaint  Patient presents with  . Leg Swelling    HISTORY OF PRESENT ILLNESS:  April Chaney is a 50 y.o. female with medical history significant for type 2 diabetes mellitus, rheumatoid arthritis, hypertension and GERD, coming to the emergency room with acute onset worsening left thigh and groin pain with associated swelling for the last couple weeks worsening over the last week.  She stated it has become throbbing.  She admitted to occasional fever and chills.  She denies any nausea or vomiting or abdominal pain.  No cough or wheezing or dyspnea.  No chest pain or palpitations.  The patient is status post left total hip arthroplasty in 2011 at [facility].  Venous Doppler came back negative for DVT, but was concerning for large fluid collection in the thigh extending from the left groin to just above the knee.  Left femur CT was recommended and it showed findings suspicious for cellulitis and severe soft tissue infection involving the anterior and medial compartment of the thigh with large rim-enhancing intramuscular fluid collections likely abscesses and soft tissue edema without definite collection in the posterior compartment.  There was additional collection coursing around the greater and lesser trochanter at the proximal femur.  There was focal bony defect within the greater trochanter adjacent to a rim-enhancing collection with mild sclerosis and periostitis suggesting possible osteomyelitis of the greater trochanter and/or postsurgical change.  There was subchondral insufficiency fracture of the medial and lateral femoral condyles of the knee.  CT of the left leg/tib/fib showed superficial  soft tissue swelling of the left lower leg likely be seen with cellulitis with  no drainable collection and subacute distal fibula, medial malleolar and posterior malleolar fractures with old healed proximal fibular diaphyseal fracture.  This was done after an x-ray showed suspected osteomyelitis.    The patient has been in the ER for a while with attempts to be transferred to [facility] and [facility] as well as [facility] without success as initial recommendation was made for IR drainage and IV antibiotics.  Contact was made with Dr. Hampton Abbot who recommended orthopedic evaluation given the fact that this abscess is reaching the hip joint space.   Dr. Leim Fabry was contacted and he agreed with patient's admission here and IR consult and drain placement with IV antibiotics.  ED Course: Latest vital signs revealed a blood pressure 102/68 with a heart rate of 129 and respiratory to 24 with pulse currently 97% on room air.  Latest temperature was 98.9.  Labs revealed a glucose of 599 with sodium 127 and CO2 of 18 with anion gap of 12.  Lactic acid was 3.7 and later 2.1.  CBC showed leukocytosis of 18.9 with neutrophilia and beta hydroxybutyrate was 0.63.  INR is 1.5 and PT 17.8.  Urinalysis showed elevated specific gravity 1045.  Blood cultures were drawn.  VBG showed pH 7.44 a bicarbonate of 22.4.  Imaging: As above.  The patient was given IV clindamycin 600 mg IV vancomycin and Zosyn, 2 L bolus of IV lactated Ringer's as well as 4 mg IV morphine sulfate and 0.5 mg of IV Dilaudid.  She will be admitted to a  medical monitored bed for further evaluation and management. PAST MEDICAL HISTORY:   Past Medical History:  Diagnosis Date  . Arthritis   . Colon polyps   . Complication of anesthesia    woke up during 1 colonscopy 3 yrs ago  . Diabetes mellitus without complication ([place])    type 2  . DM (diabetes mellitus) ([place])   . Family history of adverse reaction to anesthesia    mother slow to  awaken after 1 procedure  . GERD (gastroesophageal reflux disease)   . Hypertension    hx of no bp meds for last 11 years after weight loss  . Pancreatitis 3-4 yrs ago and feb 2018  . Presence of artificial hip    has to take antibiotics prior to any procedure-dental, etc  . Ulcer     PAST SURGICAL HISTORY:   Past Surgical History:  Procedure Laterality Date  . COLONOSCOPY WITH PROPOFOL N/A 02/11/2019   Procedure: COLONOSCOPY WITH PROPOFOL;  Surgeon: Lollie Sails, MD;  Location: [facility] ENDOSCOPY;  Service: Endoscopy;  Laterality: N/A;  . colonscopy     x 4  . ESOPHAGOGASTRODUODENOSCOPY (EGD) WITH PROPOFOL N/A 02/11/2019   Procedure: ESOPHAGOGASTRODUODENOSCOPY (EGD) WITH PROPOFOL;  Surgeon: Lollie Sails, MD;  Location: [facility] ENDOSCOPY;  Service: Endoscopy;  Laterality: N/A;  . EUS N/A 01/01/2017   Procedure: UPPER ENDOSCOPIC ULTRASOUND (EUS) LINEAR;  Surgeon: Milus Banister, MD;  Location: [facility] ENDOSCOPY;  Service: Endoscopy;  Laterality: N/A;  . FINE NEEDLE ASPIRATION  01/01/2017   Procedure: FINE NEEDLE ASPIRATION;  Surgeon: Milus Banister, MD;  Location: [facility] ENDOSCOPY;  Service: Endoscopy;;  . INCISION AND DRAINAGE ABSCESS N/A 12/29/2020   Procedure: INCISION AND DRAINAGE ABSCESS;  Surgeon: Herbert Pun, MD;  Location: [facility] ORS;  Service: General;  Laterality: N/A;  . JOINT REPLACEMENT     hip replacement  . LEFT HEART CATH AND CORONARY ANGIOGRAPHY N/A 04/26/2019   Procedure: LEFT HEART CATH AND CORONARY ANGIOGRAPHY;  Surgeon: Isaias Cowman, MD;  Location: [place] CV LAB;  Service: Cardiovascular;  Laterality: N/A;  . STRABISMUS SURGERY  12/12/2011   Procedure: REPAIR STRABISMUS;  Surgeon: Derry Skill, MD;  Location: [place];  Service: Ophthalmology;  Laterality: Right;  . TOTAL HIP ARTHROPLASTY     bilat    SOCIAL HISTORY:   Social History   Tobacco Use  . Smoking status: Some Days    Packs/day: 0.50    Types: Cigarettes  .  Smokeless tobacco: Never  Substance Use Topics  . Alcohol use: Yes    Comment: occ    FAMILY HISTORY:   Family History  Problem Relation Age of Onset  . Diabetes Mother   . Diabetes Father   . Hypertension Father     DRUG ALLERGIES:   Allergies  Allergen Reactions  . Pantoprazole Rash    REVIEW OF SYSTEMS:   ROS As per history of present illness. All pertinent systems were reviewed above. Constitutional, HEENT, cardiovascular, respiratory, GI, GU, musculoskeletal, neuro, psychiatric, endocrine, integumentary and hematologic systems were reviewed and are otherwise negative/unremarkable except for positive findings mentioned above in the HPI.   MEDICATIONS AT HOME:   Prior to Admission medications   Medication Sig Start Date End Date Taking? Authorizing Provider  aspirin EC 81 MG tablet Take 1 tablet (81 mg total) by mouth daily. Swallow whole. 12/30/20 12/30/21 Yes Masoud, Jarrett Soho, MD  furosemide (LASIX) 20 MG tablet Take 20 mg by mouth daily at 12 noon. 04/14/19  Yes  [provider]  leflunomide (ARAVA) 20 MG tablet Take 20 mg by mouth daily. 12/19/19  Yes [provider]  lisinopril (ZESTRIL) 20 MG tablet Take 20 mg by mouth daily. 10/29/20  Yes [provider]  metFORMIN (GLUCOPHAGE-XR) 500 MG 24 hr tablet Take 1,000 mg by mouth daily with supper. 05/21/20  Yes [provider]  metoprolol succinate (TOPROL-XL) 50 MG 24 hr tablet Take 50 mg by mouth daily. 07/31/20  Yes [provider]  Multiple Vitamin (MULTIVITAMIN WITH MINERALS) TABS Take 1 tablet by mouth daily.   Yes [provider]      VITAL SIGNS:  Blood pressure 122/69, pulse (!) 126, temperature 98.9 F (37.2 C), temperature source Oral, resp. rate (!) 21, height 5' (1.524 m), weight 63.5 kg, SpO2 97 %.  PHYSICAL EXAMINATION:  Physical Exam  GENERAL:  50 y.o.-year-old female patient lying in the bed with no acute distress.  EYES: Pupils equal, round, reactive  to light and accommodation. No scleral icterus. Extraocular muscles intact.  HEENT: Head atraumatic, normocephalic. Oropharynx and nasopharynx clear.  NECK:  Supple, no jugular venous distention. No thyroid enlargement, no tenderness.  LUNGS: Normal breath sounds bilaterally, no wheezing, rales,rhonchi or crepitation. No use of accessory muscles of respiration.  CARDIOVASCULAR: Regular rate and rhythm, S1, S2 normal. No murmurs, rubs, or gallops.  ABDOMEN: Soft, nondistended, nontender. Bowel sounds present. No organomegaly or mass.  EXTREMITIES: No pedal edema, cyanosis, or clubbing. Muscle skeletal: NEUROLOGIC: Cranial nerves II through XII are intact. Muscle strength 5/5 in all extremities. Sensation intact. Gait not checked.  PSYCHIATRIC: The patient is alert and oriented x 3.  Normal affect and good eye contact. SKIN: Left medial thigh erythema with induration and tenderness extending from her groin down to her leg as shown below with significant pain on range of motion.   LABORATORY PANEL:   CBC Recent Labs  Lab 05/27/21 1210  WBC 18.9*  HGB 12.6  HCT 37.9  PLT 373   ------------------------------------------------------------------------------------------------------------------  Chemistries  Recent Labs  Lab 05/27/21 1210  NA 127*  K 3.9  CL 97*  CO2 18*  GLUCOSE 599*  BUN 32*  CREATININE 0.94  CALCIUM 9.1  AST 17  ALT 15  ALKPHOS 90  BILITOT 0.8   ------------------------------------------------------------------------------------------------------------------  Cardiac Enzymes No results for input(s): TROPONINI in the last 168 hours. ------------------------------------------------------------------------------------------------------------------  RADIOLOGY:  CT TIBIA FIBULA LEFT W CONTRAST  Result Date: 05/27/2021 CLINICAL DATA:  Osteomyelitis suspected, tib/fib, xray done EXAM: CT OF THE LOWER LEFT EXTREMITY WITH CONTRAST TECHNIQUE: Multidetector CT  imaging of the lower left extremity was performed according to the standard protocol following intravenous contrast administration. CONTRAST:  53mL OMNIPAQUE IOHEXOL 300 MG/ML  SOLN COMPARISON:  None. FINDINGS: Bones/Joint/Cartilage There are subchondral insufficiency fractures of the medial and lateral femoral condyles and a probable small bone infarct along the posterior aspect of the medial femoral condyle. Small knee joint effusion. Old healed proximal tibial diaphyseal fracture. Prior lateral plate fixation of the distal fibula with persistent visible fracture line consistent with recent injury. There are few tiny bony fragments inferior to the medial malleolus consistent with avulsion injuries. There is a healing posterior malleolar fracture with developing callus formation. There is no frank bony destruction to suggest osteomyelitis. Ligaments Suboptimally assessed by CT. Muscles and Tendons There is no evidence of intramuscular collection. There is no significant muscle atrophy. Soft tissues There is skin thickening and superficial soft tissue swelling. No focal/drainable fluid collection by CT. IMPRESSION: Skin thickening and  superficial soft tissue swelling of the left lower leg, as can be seen in cellulitis. No drainable collection. Subacute distal fibula, medial malleolar, and posterior malleolar fractures. Old healed proximal fibular diaphyseal fracture. Electronically Signed   By: Maurine Simmering M.D.   On: 05/27/2021 18:50   CT FEMUR LEFT W CONTRAST  Result Date: 05/27/2021 CLINICAL DATA:  Soft tissue mass, thigh, US/xray nondiagnostic Large fluid collection on [place] from groin to knee; edema of extremity EXAM: CT OF THE LOWER RIGHT EXTREMITY WITH CONTRAST TECHNIQUE: Multidetector CT imaging of the lower right extremity was performed according to the standard protocol following intravenous contrast administration. CONTRAST:  159mL OMNIPAQUE IOHEXOL 300 MG/ML  SOLN COMPARISON:  None. FINDINGS:  Bones/Joint/Cartilage There is a left total hip arthroplasty in normal alignment without evidence of loosening or periprosthetic fracture at the greater trochanter, there is a well demarcated bony defect, with adjacent periostitis and mild sclerotic change (series 3, image [id number]), and there is adjacent fluid collection along the greater trochanter which is described below. 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 There are probable subchondral insufficiency fractures in the medial and lateral femoral condyles Ligaments Suboptimally assessed by CT. Muscles and Tendons There is an intramuscular heterogeneous collection with rim enhancement within the anterior compartment of the thigh which measures approximately 5.8 x 2.3 cm in the axial dimension and 17.5 cm in craniocaudal extent. There is additional rim enhancing fluid collection along the adductor muscles in the medial compartment extending along the entire length of the adductor muscles. There is soft tissue edema within the posterior compartment without definite posterior compartment collection. There is an additional collection which appears to course along the posterolateral aspect of the proximal femur along the greater and lesser trochanters measuring short axis up to 1.8 cm laterally and 1.6 cm posteriorly (series 3, image [id number] Soft tissues Extensive skin thickening and soft tissue swelling of the thigh. In the pelvis, there is an IUD in place. IMPRESSION: Findings are suspicious for cellulitis and severe soft tissue infection involving the anterior and medial compartments of the thigh, with large rim enhancing intramuscular fluid collections, likely abscesses, as described above. Soft tissue edema without definite collection in the posterior compartment. Additional collection coursing around the greater and lesser trochanters at the proximal femur. Focal bony defect within the greater trochanter adjacent to a rim enhancing collection, with  mild sclerosis and periostitis suggesting possible osteomyelitis of the greater trochanter and/or postsurgical change. Subchondral insufficiency fractures of the medial and lateral femoral condyles at the knee. Electronically Signed   By: Maurine Simmering M.D.   On: 05/27/2021 14:26   US Venous Img Lower Unilateral Left  Result Date: 05/27/2021 CLINICAL DATA:  Swelling left lower extremity EXAM: LEFT LOWER EXTREMITY VENOUS DOPPLER ULTRASOUND TECHNIQUE: Gray-scale sonography with compression, as well as color and duplex ultrasound, were performed to evaluate the deep venous system(s) from the level of the common femoral vein through the popliteal and proximal calf veins. COMPARISON:  None. FINDINGS: VENOUS Normal compressibility of the common femoral, superficial femoral, and popliteal veins, as well as the visualized calf veins. Visualized portions of profunda femoral vein and great saphenous vein unremarkable. No filling defects to suggest DVT on grayscale or color Doppler imaging. Doppler waveforms show normal direction of venous flow, normal respiratory plasticity and response to augmentation. Limited views of the contralateral common femoral vein are unremarkable. OTHER Large complex fluid collection seen extending from the left groin to just above the left knee measuring 11.3 x 6.4  x 3.6 cm. Limitations: none IMPRESSION: No evidence of left lower extremity DVT. Complex fluid collection extending from the left groin to just above the left knee, question hematoma. This could be further evaluated with CT (preferably with IV contrast) if felt clinically indicated. Electronically Signed   By: Rolm Baptise M.D.   On: 05/27/2021 10:20      IMPRESSION AND PLAN:  Principal Problem:   Abscess of left thigh  1.  Extensive left thigh abscess with extension to the left joint space in the setting of previous left total hip arthroplasty.  The patient has subsequent sepsis as manifested by tachycardia and tachypnea as  well and leukocytosis.  She meets criteria for severe sepsis given elevated lactic acid of 3.7. - The patient be admitted to a medical telemetry bed. - We will continue antibiotic therapy with IV vancomycin and Zosyn. - We will follow blood cultures. - IR consult to be obtained for insertion of a draining catheter. - Dr. Ruthann Cancer was notified and is aware about the patient. - Orthopedic consultation will be obtained. - Dr. Leim Fabry was notified about the patient.  2.  Uncontrolled type II diabetes mellitus with early DKA likely currently resolving. - The patient was given 2 L bolus of IV lactated ringer and will follow BMP. - We will place her on resistant subtends NovoLog with frequent fingerstick blood glucose measures. - We will keep her n.p.o. for now. - Should she be still acidotic and require more intensive treatment will place her in a stepdown unit bed on IV insulin drip with Endo tool. - We will continue her [place].  3.  Essential hypertension. - We will continue her antihypertensives.  4.  Rheumatoid arthritis. - We will continue leflunomide.  DVT prophylaxis: SCDs pending IR drain placement.  She can be placed on subcutaneous Lovenox. Code Status: full code. Family Communication:  The plan of care was discussed in details with the patient (and family). I answered all questions. The patient agreed to proceed with the above mentioned plan. Further management will depend upon hospital course. Disposition Plan: Back to previous home environment Consults called: Orthopedic consult and IR consult. All the records are reviewed and case discussed with ED provider.  Status is: Inpatient  Remains inpatient appropriate because:Ongoing diagnostic testing needed not appropriate for outpatient work up, Unsafe d/c plan, IV treatments appropriate due to intensity of illness or inability to take PO, and Inpatient level of care appropriate due to severity of illness   Dispo: The  patient is from: Home              Anticipated d/c is to: Home              Patient currently is not medically stable to d/c.              Difficult to place patient: No  TOTAL TIME TAKING CARE OF THIS PATIENT: 55 minutes.     Christel Mormon M.D on 05/27/2021 at 9:47 PM  [facility]   From 7 PM-7 AM, contact night-coverage [email or web address]  CC: Primary care physician; Marinda Elk, MD

## 2021-05-27 NOTE — Consult Note (Signed)
ORTHOPAEDIC CONSULTATION  REQUESTING PHYSICIAN: Rada Hay, MD  Chief Complaint:   Left leg pain  History of Present Illness: April Chaney is a 50 y.o. female with a history of diabetes and prior left total hip arthroplasty performed approximately 11 years ago in [place], [place].  She also has a history of prior neck abscess in July 2022 that required drainage and antibiotics.  She states she was feeling relatively well approximately 1 month ago when she started to develop increased left leg pain, primarily about the groin and thigh.  She presented to the [facility] urgent care approximately 9 days ago and was told she had potential trochanteric bursitis and was treated with NSAIDs.  She evaluated by Dr. Prince Rome who recommended that the patient follow-up with her primary surgeon.  She had an appointment to see him this upcoming week, but her pain became much more severe over the past few days leading to emergency this visit.  She is having significant difficulty with ambulation currently.  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  She lives alone as the rest of her family is out of state.    Past Medical History:  Diagnosis Date   Arthritis    Colon polyps    Complication of anesthesia    woke up during 1 colonscopy 3 yrs ago   Diabetes mellitus without complication ([place])    type 2   DM (diabetes mellitus) ([place])    Family history of adverse reaction to anesthesia    mother slow to awaken after 1 procedure   GERD (gastroesophageal reflux disease)    Hypertension    hx of no bp meds for last 11 years after weight loss   Pancreatitis 3-4 yrs ago and feb 2018   Presence of artificial hip    has to take antibiotics prior to any procedure-dental, etc   Ulcer    Past Surgical History:  Procedure Laterality Date   COLONOSCOPY WITH PROPOFOL N/A 02/11/2019   Procedure: COLONOSCOPY WITH PROPOFOL;   Surgeon: Lollie Sails, MD;  Location: [facility] ENDOSCOPY;  Service: Endoscopy;  Laterality: N/A;   colonscopy     x 4   ESOPHAGOGASTRODUODENOSCOPY (EGD) WITH PROPOFOL N/A 02/11/2019   Procedure: ESOPHAGOGASTRODUODENOSCOPY (EGD) WITH PROPOFOL;  Surgeon: Lollie Sails, MD;  Location: [facility] ENDOSCOPY;  Service: Endoscopy;  Laterality: N/A;   EUS N/A 01/01/2017   Procedure: UPPER ENDOSCOPIC ULTRASOUND (EUS) LINEAR;  Surgeon: Milus Banister, MD;  Location: [facility] ENDOSCOPY;  Service: Endoscopy;  Laterality: N/A;   FINE NEEDLE ASPIRATION  01/01/2017   Procedure: FINE NEEDLE ASPIRATION;  Surgeon: Milus Banister, MD;  Location: [facility] ENDOSCOPY;  Service: Endoscopy;;   INCISION AND DRAINAGE ABSCESS N/A 12/29/2020   Procedure: INCISION AND DRAINAGE ABSCESS;  Surgeon: Herbert Pun, MD;  Location: [facility] ORS;  Service: General;  Laterality: N/A;   JOINT REPLACEMENT     hip replacement   LEFT HEART CATH AND CORONARY ANGIOGRAPHY N/A 04/26/2019   Procedure: LEFT HEART CATH AND CORONARY ANGIOGRAPHY;  Surgeon: Isaias Cowman, MD;  Location: [place] [facility];  Service: Cardiovascular;  Laterality: N/A;   STRABISMUS SURGERY  12/12/2011   Procedure: REPAIR STRABISMUS;  Surgeon: Derry Skill, MD;  Location: [place];  Service: Ophthalmology;  Laterality: Right;   TOTAL HIP ARTHROPLASTY     bilat   Social History   Socioeconomic History   Marital status: Legally Separated    Spouse name: Not on file   Number of children: Not  on file   Years of education: Not on file   Highest education level: Not on file  Occupational History   Not on file  Tobacco Use   Smoking status: Some Days    Packs/day: 0.50    Types: Cigarettes   Smokeless tobacco: Never  Vaping Use   Vaping Use: Never used  Substance and Sexual Activity   Alcohol use: Yes    Comment: occ   Drug use: No   Sexual activity: Not on file  Other Topics Concern   Not on file  Social History Narrative   Not on  file   Social Determinants of Health   Financial Resource Strain: Not on file  Food Insecurity: Not on file  Transportation Needs: Not on file  Physical Activity: Not on file  Stress: Not on file  Social Connections: Not on file   Family History  Problem Relation Age of Onset   Diabetes Mother    Diabetes Father    Hypertension Father    Allergies  Allergen Reactions   Pantoprazole Rash   Prior to Admission medications   Medication Sig Start Date End Date Taking? Authorizing Provider  albuterol (VENTOLIN HFA) 108 (90 Base) MCG/ACT inhaler Inhale 2 puffs into the lungs every 6 (six) hours as needed for wheezing or shortness of breath (8). 04/05/19   Arta Silence, MD  ARTIFICIAL TEAR OP Apply 1 drop to eye daily as needed (dry eyes).    [provider]  aspirin EC 81 MG tablet Take 1 tablet (81 mg total) by mouth daily. Swallow whole. 12/30/20 12/30/21  George Hugh, MD  atorvastatin (LIPITOR) 40 MG tablet Take 1 tablet (40 mg total) by mouth daily. 12/30/20 12/30/21  George Hugh, MD  buPROPion (WELLBUTRIN XL) 300 MG 24 hr tablet Take 300 mg by mouth daily.  01/03/16   [provider]  CALCIUM CARBONATE-VITAMIN D PO Take 2 tablets by mouth daily with breakfast. 500 mg  2000 mg    [provider]  diphenhydramine-acetaminophen (TYLENOL PM) 25-500 MG TABS tablet Take 2 tablets by mouth at bedtime as needed (sleep).    [provider]  folic acid (FOLVITE) 1 MG tablet Take 3 mg by mouth daily.    [provider]  furosemide (LASIX) 20 MG tablet Take 20 mg by mouth daily at 12 noon. 04/14/19   [provider]  leflunomide (ARAVA) 20 MG tablet Take 20 mg by mouth daily. 12/19/19   [provider]  lisinopril (ZESTRIL) 20 MG tablet Take 20 mg by mouth daily. 10/29/20   [provider]  meloxicam (MOBIC) 15 MG tablet Take 15 mg by mouth daily. 07/10/20   [provider]  metFORMIN (GLUCOPHAGE-XR) 500 MG 24  hr tablet Take 1,000 mg by mouth daily with supper. 05/21/20   [provider]  metoprolol succinate (TOPROL-XL) 50 MG 24 hr tablet Take 50 mg by mouth daily. 07/31/20   [provider]  Multiple Vitamin (MULTIVITAMIN WITH MINERALS) TABS Take 1 tablet by mouth daily.    [provider]  naproxen (NAPROSYN) 500 MG tablet Take 500 mg by mouth 2 (two) times daily. 12/26/20   [provider]  omeprazole (PRILOSEC) 40 MG capsule Take 40 mg by mouth 2 (two) times daily as needed.    [provider]  Sarilumab [facility]) 200 MG/1.14ML SOAJ Inject into the skin. 11/12/20   [provider]  SEMGLEE, YFGN, 100 UNIT/ML SOPN Inject 50 microcuries into the skin daily. 12/03/20  [provider]  Tofacitinib Citrate 5 MG TABS Take 5 mg by mouth 2 (two) times daily.     [provider]  Turmeric 500 MG TABS Take 500 mg by mouth daily.    [provider]   Recent Labs    05/27/21 1210  WBC 18.9*  HGB 12.6  HCT 37.9  PLT 373  K 3.9  CL 97*  CO2 18*  BUN 32*  CREATININE 0.94  GLUCOSE 599*  CALCIUM 9.1  INR 1.5*   CT FEMUR LEFT W CONTRAST  Result Date: 05/27/2021 CLINICAL DATA:  Soft tissue mass, thigh, US/xray nondiagnostic Large fluid collection on [place] from groin to knee; edema of extremity EXAM: CT OF THE LOWER RIGHT EXTREMITY WITH CONTRAST TECHNIQUE: Multidetector CT imaging of the lower right extremity was performed according to the standard protocol following intravenous contrast administration. CONTRAST:  167mL OMNIPAQUE IOHEXOL 300 MG/ML  SOLN COMPARISON:  None. FINDINGS: Bones/Joint/Cartilage There is a left total hip arthroplasty in normal alignment without evidence of loosening or periprosthetic fracture at the greater trochanter, there is a well demarcated bony defect, with adjacent periostitis and mild sclerotic change (series 3, image [id number]), and there is adjacent fluid collection along the greater trochanter which is  described below. 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 There are probable subchondral insufficiency fractures in the medial and lateral femoral condyles Ligaments Suboptimally assessed by CT. Muscles and Tendons There is an intramuscular heterogeneous collection with rim enhancement within the anterior compartment of the thigh which measures approximately 5.8 x 2.3 cm in the axial dimension and 17.5 cm in craniocaudal extent. There is additional rim enhancing fluid collection along the adductor muscles in the medial compartment extending along the entire length of the adductor muscles. There is soft tissue edema within the posterior compartment without definite posterior compartment collection. There is an additional collection which appears to course along the posterolateral aspect of the proximal femur along the greater and lesser trochanters measuring short axis up to 1.8 cm laterally and 1.6 cm posteriorly (series 3, image [id number] Soft tissues Extensive skin thickening and soft tissue swelling of the thigh. In the pelvis, there is an IUD in place. IMPRESSION: Findings are suspicious for cellulitis and severe soft tissue infection involving the anterior and medial compartments of the thigh, with large rim enhancing intramuscular fluid collections, likely abscesses, as described above. Soft tissue edema without definite collection in the posterior compartment. Additional collection coursing around the greater and lesser trochanters at the proximal femur. Focal bony defect within the greater trochanter adjacent to a rim enhancing collection, with mild sclerosis and periostitis suggesting possible osteomyelitis of the greater trochanter and/or postsurgical change. Subchondral insufficiency fractures of the medial and lateral femoral condyles at the knee. Electronically Signed   By: Maurine Simmering M.D.   On: 05/27/2021 14:26   US Venous Img Lower Unilateral Left  Result Date: 05/27/2021 CLINICAL  DATA:  Swelling left lower extremity EXAM: LEFT LOWER EXTREMITY VENOUS DOPPLER ULTRASOUND TECHNIQUE: Gray-scale sonography with compression, as well as color and duplex ultrasound, were performed to evaluate the deep venous system(s) from the level of the common femoral vein through the popliteal and proximal calf veins. COMPARISON:  None. FINDINGS: VENOUS Normal compressibility of the common femoral, superficial femoral, and popliteal veins, as well as the visualized calf veins. Visualized portions of profunda femoral vein and great saphenous vein unremarkable. No filling defects to suggest DVT on grayscale or color Doppler imaging. Doppler waveforms show normal direction of  venous flow, normal respiratory plasticity and response to augmentation. Limited views of the contralateral common femoral vein are unremarkable. OTHER Large complex fluid collection seen extending from the left groin to just above the left knee measuring 11.3 x 6.4 x 3.6 cm. Limitations: none IMPRESSION: No evidence of left lower extremity DVT. Complex fluid collection extending from the left groin to just above the left knee, question hematoma. This could be further evaluated with CT (preferably with IV contrast) if felt clinically indicated. Electronically Signed   By: Rolm Baptise M.D.   On: 05/27/2021 10:20     Positive ROS: All other systems have been reviewed and were otherwise negative with the exception of those mentioned in the HPI and as above.  Physical Exam: BP 121/81 (BP Location: Left Arm)   Pulse (!) 126   Temp 99.2 F (37.3 C) (Oral)   Resp (!) 22   Ht 5' (1.524 m)   Wt 63.5 kg   SpO2 99%   BMI 27.34 kg/m  General:  Alert, no acute distress Psychiatric:  Patient is competent for consent with normal mood and affect   Cardiovascular:  No pedal edema, regular rate and rhythm Respiratory:  No wheezing, non-labored breathing GI:  Abdomen is soft and non-tender Skin:  No lesions in the area of chief complaint, no  erythema Neurologic:  Sensation intact distally, CN grossly intact Lymphatic:  No axillary or cervical lymphadenopathy  Orthopedic Exam:  LLE: 5/5 DF/PF/EHL SILT s/s/t/sp/dp distr Foot wwp Significant swelling, firmness, and erythema about the anterior and medial aspects of the thigh from the groin region going distal towards the knee and into the calf.  There is significant tenderness in this region.  There is also significant tenderness about the greater trochanter. Compartments are soft and compressible.  No pain with passive extension.   Imaging:  As above: Significant abscesses about the medial and anterior compartments of the thigh extending from the groin region and involving most of the length of the femur.  Also appear to be abscesses about the greater trochanter with possible bony erosion in this region as well.  Left total hip arthroplasty is present.  Assessment/Plan: 50 year old female with extensive infection about the left lower extremity in the setting of left total hip arthroplasty. 1.  I discussed with the patient the extensive nature of her infection.  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  She also has extensive abscess pockets about the medial and anterior compartments of the thigh and the greater trochanter.  Discussed that she may require multiple surgeries including debridements, possible flap coverage, and likely explant with future revision hip arthroplasty.  Ultimately if infection cannot be appropriately controlled, she may amputation. I have also discussed this patient's case with my orthopedic partners here.  Consensus recommendation would be for transfer to a higher level facility given the patient's extensive pathology.  2.  Given her increased white blood count, lactate, and hyperglycemia with blood glucose level of 599, I am in agreement with starting broad-spectrum IV antibiotics.  3.  If transfer is not available immediately, I recommend IR  guided aspiration for possible abscess pockets to obtain culture sample.  4.  Given the significant about the calf, I also recommend obtaining advanced imaging of the left calf region.  All of the above was discussed with the medical team in the emergency department.  Leim Fabry   05/27/2021 5:10 PM

## 2021-05-28 ENCOUNTER — Inpatient Hospital Stay: Payer: BC Managed Care – PPO

## 2021-05-28 ENCOUNTER — Encounter: Payer: Self-pay | Admitting: Family Medicine

## 2021-05-28 DIAGNOSIS — M069 Rheumatoid arthritis, unspecified: Secondary | ICD-10-CM

## 2021-05-28 DIAGNOSIS — E1169 Type 2 diabetes mellitus with other specified complication: Secondary | ICD-10-CM | POA: Diagnosis not present

## 2021-05-28 DIAGNOSIS — A419 Sepsis, unspecified organism: Secondary | ICD-10-CM | POA: Diagnosis not present

## 2021-05-28 DIAGNOSIS — R6520 Severe sepsis without septic shock: Secondary | ICD-10-CM | POA: Diagnosis not present

## 2021-05-28 DIAGNOSIS — L02416 Cutaneous abscess of left lower limb: Secondary | ICD-10-CM | POA: Diagnosis not present

## 2021-05-28 DIAGNOSIS — G9340 Encephalopathy, unspecified: Secondary | ICD-10-CM

## 2021-05-28 LAB — BASIC METABOLIC PANEL
Anion gap: 6 (ref 5–15)
Anion gap: 7 (ref 5–15)
BUN: 25 mg/dL — ABNORMAL HIGH (ref 6–20)
BUN: 25 mg/dL — ABNORMAL HIGH (ref 6–20)
CO2: 24 mmol/L (ref 22–32)
CO2: 24 mmol/L (ref 22–32)
Calcium: 8.7 mg/dL — ABNORMAL LOW (ref 8.9–10.3)
Calcium: 8.9 mg/dL (ref 8.9–10.3)
Chloride: 100 mmol/L (ref 98–111)
Chloride: 103 mmol/L (ref 98–111)
Creatinine, Ser: 0.62 mg/dL (ref 0.44–1.00)
Creatinine, Ser: 0.7 mg/dL (ref 0.44–1.00)
GFR, Estimated: >60 mL/min (ref >60)
GFR, Estimated: >60 mL/min (ref >60)
Glucose, Bld: 181 mg/dL — ABNORMAL HIGH (ref 70–99)
Glucose, Bld: 139 mg/dL — ABNORMAL HIGH (ref 70–99)
Potassium: 3.5 mmol/L (ref 3.5–5.1)
Potassium: 3.3 mmol/L — ABNORMAL LOW (ref 3.5–5.1)
Sodium: 130 mmol/L — ABNORMAL LOW (ref 135–145)
Sodium: 134 mmol/L — ABNORMAL LOW (ref 135–145)

## 2021-05-28 LAB — HEMOGLOBIN A1C
Hgb A1c MFr Bld: 9.2 % — ABNORMAL HIGH (ref 4.8–5.6)
Mean Plasma Glucose: 217 mg/dL

## 2021-05-28 LAB — CBG MONITORING, ED
Glucose-Capillary: 177 mg/dL — ABNORMAL HIGH (ref 70–99)
Glucose-Capillary: 164 mg/dL — ABNORMAL HIGH (ref 70–99)
Glucose-Capillary: 135 mg/dL — ABNORMAL HIGH (ref 70–99)
Glucose-Capillary: 125 mg/dL — ABNORMAL HIGH (ref 70–99)
Glucose-Capillary: 108 mg/dL — ABNORMAL HIGH (ref 70–99)
Glucose-Capillary: 175 mg/dL — ABNORMAL HIGH (ref 70–99)

## 2021-05-28 LAB — CBC
HCT: 35.9 % — ABNORMAL LOW (ref 36.0–46.0)
Hemoglobin: 11.9 g/dL — ABNORMAL LOW (ref 12.0–15.0)
MCH: 26 pg (ref 26.0–34.0)
MCHC: 33.1 g/dL (ref 30.0–36.0)
MCV: 78.4 fL — ABNORMAL LOW (ref 80.0–100.0)
Platelets: 322 10*3/uL (ref 150–400)
RBC: 4.58 MIL/uL (ref 3.87–5.11)
RDW: 15.9 % — ABNORMAL HIGH (ref 11.5–15.5)
WBC: 16 10*3/uL — ABNORMAL HIGH (ref 4.0–10.5)
nRBC: 0 % (ref 0.0–0.2)

## 2021-05-28 LAB — GLUCOSE, CAPILLARY: Glucose-Capillary: 140 mg/dL — ABNORMAL HIGH (ref 70–99)

## 2021-05-28 LAB — LACTIC ACID, PLASMA
Lactic Acid, Venous: 1.9 mmol/L (ref 0.5–1.9)
Lactic Acid, Venous: 1.7 mmol/L (ref 0.5–1.9)

## 2021-05-28 MED ORDER — INSULIN ASPART 100 UNIT/ML IJ SOLN
0.0000 [IU] | INTRAMUSCULAR | Status: DC
Start: 2021-05-28 — End: 2021-05-29
  Administered 2021-05-28: 4 [IU] via SUBCUTANEOUS
  Administered 2021-05-28: 3 [IU] via SUBCUTANEOUS
  Administered 2021-05-29: 4 [IU] via SUBCUTANEOUS
  Administered 2021-05-29: 15 [IU] via SUBCUTANEOUS
  Administered 2021-05-29: 3 [IU] via SUBCUTANEOUS
  Administered 2021-05-29: 4 [IU] via SUBCUTANEOUS
  Filled 2021-05-28 (×6): qty 1

## 2021-05-28 MED ORDER — FENTANYL CITRATE (PF) 100 MCG/2ML IJ SOLN
INTRAMUSCULAR | Status: AC
  Administered 2021-05-28: 50 ug
  Filled 2021-05-28: qty 2

## 2021-05-28 MED ORDER — LIVING WELL WITH DIABETES BOOK
Freq: Once | Status: AC
  Filled 2021-05-28: qty 1

## 2021-05-28 MED ORDER — MORPHINE SULFATE (PF) 2 MG/ML IV SOLN
2.0000 mg | INTRAVENOUS | Status: DC | PRN
  Administered 2021-05-28 – 2021-05-29 (×3): 2 mg via INTRAVENOUS
  Filled 2021-05-28 (×4): qty 1

## 2021-05-28 MED ORDER — MIDAZOLAM HCL 2 MG/2ML IJ SOLN
1.0000 mg | Freq: Once | INTRAMUSCULAR | Status: AC
  Administered 2021-05-28: 1 mg via INTRAVENOUS

## 2021-05-28 MED ORDER — SODIUM CHLORIDE 0.9% FLUSH
5.0000 mL | Freq: Three times a day (TID) | INTRAVENOUS | Status: DC
  Administered 2021-05-28 – 2021-06-05 (×21): 5 mL

## 2021-05-28 MED ORDER — MIDAZOLAM HCL 2 MG/2ML IJ SOLN
INTRAMUSCULAR | Status: AC
  Administered 2021-05-28: 1 mg
  Filled 2021-05-28: qty 4

## 2021-05-28 MED ORDER — VANCOMYCIN HCL 500 MG/100ML IV SOLN
500.0000 mg | Freq: Two times a day (BID) | INTRAVENOUS | Status: DC
  Administered 2021-05-28 – 2021-05-30 (×5): 500 mg via INTRAVENOUS
  Filled 2021-05-28 (×6): qty 100

## 2021-05-28 MED ORDER — POTASSIUM CHLORIDE CRYS ER 20 MEQ PO TBCR
40.0000 meq | EXTENDED_RELEASE_TABLET | Freq: Once | ORAL | Status: AC
  Administered 2021-05-28: 40 meq via ORAL
  Filled 2021-05-28: qty 2

## 2021-05-28 MED ORDER — MIDAZOLAM HCL 2 MG/2ML IJ SOLN: 1.0000 mg | Freq: Once | INTRAMUSCULAR | Status: AC

## 2021-05-28 MED ORDER — FENTANYL CITRATE PF 50 MCG/ML IJ SOSY: 50.0000 ug | PREFILLED_SYRINGE | Freq: Once | INTRAMUSCULAR | Status: DC

## 2021-05-28 MED ORDER — FENTANYL CITRATE PF 50 MCG/ML IJ SOSY
25.0000 ug | PREFILLED_SYRINGE | Freq: Once | INTRAMUSCULAR | Status: AC
  Administered 2021-05-28: 25 ug via INTRAVENOUS

## 2021-05-28 NOTE — ED Notes (Signed)
Lab called to obtain blood work.

## 2021-05-28 NOTE — Discharge Instructions (Signed)
Please check glucose 3-4 times per day. Glucose goals 80-130 mg/dl before meals and A1C 7% or less. Please reach out to Dr. Honor Junes if glucose is consistently over 180 mg/dl at home to ask if adjustments are needed with DM medications.

## 2021-05-28 NOTE — Consult Note (Signed)
Chief Complaint: Patient was seen in consultation today for left lower extremity fluid collections at the request of Mansy, Arvella Merles, MD  Referring Physician(s): Sidney Ace, Arvella Merles, MD  Supervising Physician: Jacqulynn Cadet  Patient Status: [facility] - ED  History of Present Illness: April Chaney is a 50 y.o. female who presented to ED 12/5 with complaints of left groin and thigh pain and swelling x 1 month that has progressively worsened this past week. Patient has a history of bilateral hip replacements that were placed 11 years ago at [place]. She also states she fell in October and broke her left ankle, she denies hitting her left hip during fall. She also complains of left lateral shin wound she has been dealing with for awhile and working with dermatology to improve.   [place] and CT imaging done revealed no evidence of DVT in LLE, however multiple rim enhancing intramuscular thigh fluid collections medial, anterior and coursing around the greater and lesser trochanters. Orthopedics has been consulted with recommendations of surgical intervention involving possible multiple surgeries including debridements, possible flap coverage, and likely explant with future revision hip arthroplasty.  Ultimately if infection cannot be appropriately controlled, she may amputation. Given transfer for surgical recommendation is not immediately available, IR has received consult for image guided aspiration and possible drain placement for culture and better control if infection is present.   The patient states her current LLE pain is better controlled now with pain medications she is receiving. She denies any current chest pain, shortness of breath or palpitations. She denies any recent blood thinner use. The patient does admit to recent fevers. The patient denies any history of sleep apnea or chronic oxygen use. She has no known complications to sedation.    Past Medical History:  Diagnosis Date    Arthritis    Colon polyps    Complication of anesthesia    woke up during 1 colonscopy 3 yrs ago   Diabetes mellitus without complication ([place])    type 2   DM (diabetes mellitus) ([place])    Family history of adverse reaction to anesthesia    mother slow to awaken after 1 procedure   GERD (gastroesophageal reflux disease)    Hypertension    hx of no bp meds for last 11 years after weight loss   Pancreatitis 3-4 yrs ago and feb 2018   Presence of artificial hip    has to take antibiotics prior to any procedure-dental, etc   Ulcer     Past Surgical History:  Procedure Laterality Date   COLONOSCOPY WITH PROPOFOL N/A 02/11/2019   Procedure: COLONOSCOPY WITH PROPOFOL;  Surgeon: Lollie Sails, MD;  Location: [facility] ENDOSCOPY;  Service: Endoscopy;  Laterality: N/A;   colonscopy     x 4   ESOPHAGOGASTRODUODENOSCOPY (EGD) WITH PROPOFOL N/A 02/11/2019   Procedure: ESOPHAGOGASTRODUODENOSCOPY (EGD) WITH PROPOFOL;  Surgeon: Lollie Sails, MD;  Location: [facility] ENDOSCOPY;  Service: Endoscopy;  Laterality: N/A;   EUS N/A 01/01/2017   Procedure: UPPER ENDOSCOPIC ULTRASOUND (EUS) LINEAR;  Surgeon: Milus Banister, MD;  Location: [facility] ENDOSCOPY;  Service: Endoscopy;  Laterality: N/A;   FINE NEEDLE ASPIRATION  01/01/2017   Procedure: FINE NEEDLE ASPIRATION;  Surgeon: Milus Banister, MD;  Location: [facility] ENDOSCOPY;  Service: Endoscopy;;   INCISION AND DRAINAGE ABSCESS N/A 12/29/2020   Procedure: INCISION AND DRAINAGE ABSCESS;  Surgeon: Herbert Pun, MD;  Location: [facility] ORS;  Service: General;  Laterality: N/A;   JOINT REPLACEMENT     hip  replacement   LEFT HEART CATH AND CORONARY ANGIOGRAPHY N/A 04/26/2019   Procedure: LEFT HEART CATH AND CORONARY ANGIOGRAPHY;  Surgeon: Isaias Cowman, MD;  Location: [facility];  Service: Cardiovascular;  Laterality: N/A;   STRABISMUS SURGERY  12/12/2011   Procedure: REPAIR STRABISMUS;  Surgeon: Derry Skill, MD;  Location: [place];  Service: Ophthalmology;  Laterality: Right;   TOTAL HIP ARTHROPLASTY     bilat    Allergies: Pantoprazole  Medications: Prior to Admission medications   Medication Sig Start Date End Date Taking? Authorizing Provider  aspirin EC 81 MG tablet Take 1 tablet (81 mg total) by mouth daily. Swallow whole. 12/30/20 12/30/21 Yes Masoud, Jarrett Soho, MD  furosemide (LASIX) 20 MG tablet Take 20 mg by mouth daily at 12 noon. 04/14/19  Yes [provider]  leflunomide (ARAVA) 20 MG tablet Take 20 mg by mouth daily. 12/19/19  Yes [provider]  lisinopril (ZESTRIL) 20 MG tablet Take 20 mg by mouth daily. 10/29/20  Yes [provider]  metFORMIN (GLUCOPHAGE-XR) 500 MG 24 hr tablet Take 1,000 mg by mouth daily with supper. 05/21/20  Yes [provider]  metoprolol succinate (TOPROL-XL) 50 MG 24 hr tablet Take 50 mg by mouth daily. 07/31/20  Yes [provider]  Multiple Vitamin (MULTIVITAMIN WITH MINERALS) TABS Take 1 tablet by mouth daily.   Yes [provider]     Family History  Problem Relation Age of Onset   Diabetes Mother    Diabetes Father    Hypertension Father     Social History   Socioeconomic History   Marital status: Legally Separated    Spouse name: Not on file   Number of children: Not on file   Years of education: Not on file   Highest education level: Not on file  Occupational History   Not on file  Tobacco Use   Smoking status: Some Days    Packs/day: 0.50    Types: Cigarettes   Smokeless tobacco: Never  Vaping Use   Vaping Use: Never used  Substance and Sexual Activity   Alcohol use: Yes    Comment: occ   Drug use: No   Sexual activity: Not on file  Other Topics Concern   Not on file  Social History Narrative   Not on file   Social Determinants of Health   Financial Resource Strain: Not on file  Food Insecurity: Not on file  Transportation Needs: Not on file  Physical Activity: Not on file  Stress:  Not on file  Social Connections: Not on file    Review of Systems: A 12 point ROS discussed and pertinent positives are indicated in the HPI above.  All other systems are negative.  Review of Systems  Vital Signs: BP 102/65   Pulse (!) 105   Temp 98.5 F (36.9 C) (Oral)   Resp (!) 23   Ht 5' (1.524 m)   Wt 140 lb (63.5 kg)   SpO2 98%   BMI 27.34 kg/m   Physical Exam Constitutional:      Appearance: Normal appearance.  Cardiovascular:     Rate and Rhythm: Regular rhythm. Tachycardia present.  Pulmonary:     Effort: Pulmonary effort is normal. No respiratory distress.     Breath sounds: Normal breath sounds.  Neurological:     Mental Status: She is alert and oriented to person, place, and time.  Extremity: LLE calf with lateral wound non draining and scabbed with medial  calf erythema, firmness, swelling and tenderness, left upper thigh/groin swelling and firmness with TTP, TTP around lateral hip  Imaging: CT TIBIA FIBULA LEFT W CONTRAST  Result Date: 05/27/2021 CLINICAL DATA:  Osteomyelitis suspected, tib/fib, xray done EXAM: CT OF THE LOWER LEFT EXTREMITY WITH CONTRAST TECHNIQUE: Multidetector CT imaging of the lower left extremity was performed according to the standard protocol following intravenous contrast administration. CONTRAST:  29mL OMNIPAQUE IOHEXOL 300 MG/ML  SOLN COMPARISON:  None. FINDINGS: Bones/Joint/Cartilage There are subchondral insufficiency fractures of the medial and lateral femoral condyles and a probable small bone infarct along the posterior aspect of the medial femoral condyle. Small knee joint effusion. Old healed proximal tibial diaphyseal fracture. Prior lateral plate fixation of the distal fibula with persistent visible fracture line consistent with recent injury. There are few tiny bony fragments inferior to the medial malleolus consistent with avulsion injuries. There is a healing posterior malleolar fracture with developing callus formation. There is  no frank bony destruction to suggest osteomyelitis. Ligaments Suboptimally assessed by CT. Muscles and Tendons There is no evidence of intramuscular collection. There is no significant muscle atrophy. Soft tissues There is skin thickening and superficial soft tissue swelling. No focal/drainable fluid collection by CT. IMPRESSION: Skin thickening and superficial soft tissue swelling of the left lower leg, as can be seen in cellulitis. No drainable collection. Subacute distal fibula, medial malleolar, and posterior malleolar fractures. Old healed proximal fibular diaphyseal fracture. Electronically Signed   By: Maurine Simmering M.D.   On: 05/27/2021 18:50   CT FEMUR LEFT W CONTRAST  Result Date: 05/27/2021 CLINICAL DATA:  Soft tissue mass, thigh, US/xray nondiagnostic Large fluid collection on [place] from groin to knee; edema of extremity EXAM: CT OF THE LOWER RIGHT EXTREMITY WITH CONTRAST TECHNIQUE: Multidetector CT imaging of the lower right extremity was performed according to the standard protocol following intravenous contrast administration. CONTRAST:  154mL OMNIPAQUE IOHEXOL 300 MG/ML  SOLN COMPARISON:  None. 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 There are probable subchondral insufficiency fractures in the medial and lateral femoral condyles Ligaments Suboptimally assessed by CT. Muscles and Tendons There is an intramuscular heterogeneous collection with rim enhancement within the anterior compartment of the thigh which measures approximately 5.8 x 2.3 cm in the axial dimension and 17.5 cm in craniocaudal extent.  There is additional rim enhancing fluid collection along the adductor muscles in the medial compartment extending along the entire length of the adductor muscles. There is soft tissue edema within the posterior compartment without definite posterior compartment collection. There is an additional collection which appears to course along the posterolateral aspect of the proximal femur along the greater and lesser trochanters measuring short axis up to 1.8 cm laterally and 1.6 cm posteriorly (series 3, image [id number] Soft tissues Extensive skin thickening and soft tissue swelling of the thigh. In the pelvis, there is an IUD in place. IMPRESSION: Findings are suspicious for cellulitis and severe soft tissue infection involving the anterior and medial compartments of the thigh, with large rim enhancing intramuscular fluid collections, likely abscesses, as described above. Soft tissue edema without definite collection in the posterior compartment. Additional collection coursing around the greater and lesser trochanters  at the proximal femur. Focal bony defect within the greater trochanter adjacent to a rim enhancing collection, with mild sclerosis and periostitis suggesting possible osteomyelitis of the greater trochanter and/or postsurgical change. Subchondral insufficiency fractures of the medial and lateral femoral condyles at the knee. Electronically Signed   By: Maurine Simmering M.D.   On: 05/27/2021 14:26   US Venous Img Lower Unilateral Left  Result Date: 05/27/2021 CLINICAL DATA:  Swelling left lower extremity EXAM: LEFT LOWER EXTREMITY VENOUS DOPPLER ULTRASOUND TECHNIQUE: Gray-scale sonography with compression, as well as color and duplex ultrasound, were performed to evaluate the deep venous system(s) from the level of the common femoral vein through the popliteal and proximal calf veins. COMPARISON:  None. FINDINGS: VENOUS Normal compressibility of the common femoral, superficial femoral, and popliteal veins, as  well as the visualized calf veins. Visualized portions of profunda femoral vein and great saphenous vein unremarkable. No filling defects to suggest DVT on grayscale or color Doppler imaging. Doppler waveforms show normal direction of venous flow, normal respiratory plasticity and response to augmentation. Limited views of the contralateral common femoral vein are unremarkable. OTHER Large complex fluid collection seen extending from the left groin to just above the left knee measuring 11.3 x 6.4 x 3.6 cm. Limitations: none IMPRESSION: No evidence of left lower extremity DVT. Complex fluid collection extending from the left groin to just above the left knee, question hematoma. This could be further evaluated with CT (preferably with IV contrast) if felt clinically indicated. Electronically Signed   By: Rolm Baptise M.D.   On: 05/27/2021 10:20    Labs:  CBC: Recent Labs    12/29/20 0828 12/30/20 0639 05/27/21 1210 05/28/21 0537  WBC 7.8 11.6* 18.9* 16.0*  HGB 12.0 11.9* 12.6 11.9*  HCT 37.1 35.5* 37.9 35.9*  PLT 215 230 373 322    COAGS: Recent Labs    05/27/21 1210  INR 1.5*    BMP: Recent Labs    12/30/20 0639 05/27/21 1210 05/28/21 0222 05/28/21 0537  NA 136 127* 130* 134*  K 3.9 3.9 3.5 3.3*  CL 106 97* 100 103  CO2 24 18* 24 24  GLUCOSE 193* 599* 181* 139*  BUN 14 32* 25* 25*  CALCIUM 8.7* 9.1 8.7* 8.9  CREATININE 0.63 0.94 0.62 0.70  GFRNONAA >60 >60 >60 >60    LIVER FUNCTION TESTS: Recent Labs    12/21/20 1520 12/27/20 0816 12/29/20 0828 05/27/21 1210  BILITOT 0.8 0.8 0.6 0.8  AST 32 24 19 17   ALT 23 23 15 15   ALKPHOS 71 66 60 90  PROT 8.0 7.1 6.0* 7.3  ALBUMIN 4.5 4.2 3.5 3.0*    Assessment and Plan: 50 year old female who presented to ED 12/5 with complaints of left groin and thigh pain and swelling x 1 month that has progressively worsened this past week. Patient has a history of bilateral hip replacements that were placed 11 years ago at [place].    [place] and CT imaging done revealed no evidence of DVT in LLE, however multiple rim enhancing intramuscular thigh fluid collections medial, anterior and coursing around the greater and lesser trochanters. Orthopedics has been consulted with recommendations of surgical intervention involving possible multiple surgeries including debridements, possible flap coverage, and likely explant with future revision hip arthroplasty.  Ultimately if infection cannot be appropriately controlled, she may amputation. Given transfer for surgical recommendation is not immediately available, IR has received consult for image guided aspiration and possible drain placement for culture and  better control if infection is present.   The patient has been NPO, no blood thinners taken, imaging, labs and vitals have been reviewed.  Risks and benefits discussed with the patient including need for possible drain or multiple drain placements, bleeding, infection, damage to adjacent structures and sepsis.  All of the patient's questions were answered, patient is agreeable to proceed. Consent signed and in chart.    Thank you for this interesting consult.  I greatly enjoyed meeting April Chaney and look forward to participating in their care.  A copy of this report was sent to the requesting provider on this date.  Electronically Signed: Hedy Jacob, PA-C 05/28/2021, 10:09 AM   I spent a total of 40 Minutes in face to face in clinical consultation, greater than 50% of which was counseling/coordinating care for left lower extremity fluid collections.

## 2021-05-28 NOTE — Procedures (Signed)
Interventional Radiology Procedure Note  Procedure:  1.) Placement of 54F drain into left thigh anterior compartment abscess - gross purulence 2.) Placement of 5F drain into the left lateral upper thigh abscess - gross purulence  Complications: None  Estimated Blood Loss: None  Recommendations: - Drains to JP - Flush drains daily - Culture sent  Signed,  Criselda Peaches, MD

## 2021-05-28 NOTE — Progress Notes (Signed)
PROGRESS NOTE    HPI was taken from Dr. Sidney Ace: April Chaney is a 50 y.o. female with medical history significant for type 2 diabetes mellitus, rheumatoid arthritis, hypertension and GERD, coming to the emergency room with acute onset worsening left thigh and groin pain with associated swelling for the last couple weeks worsening over the last week.  She stated it has become throbbing.  She admitted to occasional fever and chills.  She denies any nausea or vomiting or abdominal pain.  No cough or wheezing or dyspnea.  No chest pain or palpitations.  The patient is status post left total hip arthroplasty in 2011 at [facility].  Venous Doppler came back negative for DVT, but was concerning for large fluid collection in the thigh extending from the left groin to just above the knee.  Left femur CT was recommended and it showed findings suspicious for cellulitis and severe soft tissue infection involving the anterior and medial compartment of the thigh with large rim-enhancing intramuscular fluid collections likely abscesses and soft tissue edema without definite collection in the posterior compartment.  There was additional collection coursing around the greater and lesser trochanter at the proximal femur.  There was focal bony defect within the greater trochanter adjacent to a rim-enhancing collection with mild sclerosis and periostitis suggesting possible osteomyelitis of the greater trochanter and/or postsurgical change.  There was subchondral insufficiency fracture of the medial and lateral femoral condyles of the knee.  CT of the left leg/tib/fib showed superficial soft tissue swelling of the left lower leg likely be seen with cellulitis with  no drainable collection and subacute distal fibula, medial malleolar and posterior malleolar fractures with old healed proximal fibular diaphyseal fracture.  This was done after an x-ray showed suspected osteomyelitis.    The patient has been in the ER for a  while with attempts to be transferred to [facility] and [facility] as well as [facility] without success as initial recommendation was made for IR drainage and IV antibiotics.  Contact was made with Dr. Hampton Abbot who recommended orthopedic evaluation given the fact that this abscess is reaching the hip joint space.   Dr. Leim Fabry was contacted and he agreed with patient's admission here and IR consult and drain placement with IV antibiotics.  ED Course: Latest vital signs revealed a blood pressure 102/68 with a heart rate of 129 and respiratory to 24 with pulse currently 97% on room air.  Latest temperature was 98.9.  Labs revealed a glucose of 599 with sodium 127 and CO2 of 18 with anion gap of 12.  Lactic acid was 3.7 and later 2.1.  CBC showed leukocytosis of 18.9 with neutrophilia and beta hydroxybutyrate was 0.63.  INR is 1.5 and PT 17.8.  Urinalysis showed elevated specific gravity 1045.  Blood cultures were drawn.  VBG showed pH 7.44 a bicarbonate of 22.4.   April Chaney  BMW:[id number] DOB: December 22, 1970 DOA: 05/27/2021 PCP: Marinda Elk, MD   Assessment & Plan:   Principal Problem:   Abscess of left thigh  Abscesses of left thigh: w/ extension to left joint space. Hx of previous left total hip arthroplasty in 2011. Continue on IV vanco, zosyn. NPO. Will go for IR drainage of abscesses. ER evidently tried to transfer pt to higher level of care but was unable to do so pt was admitted here. ID consulted. Pt broke her left ankle in [place] in 02/2021 and had a metal plate & screw put in and returned home (back  to [place]) in Oct and this leg pain/swelling/redness started several weeks ago as per pt.  Severe sepsis: met criteria w/ tachycardia, tachypnea, leukocytosis, elevated lactic acid and abscess of left thigh. Continue on IVFs. Continue on IV vanco, zosyn.  DM2: likely uncontrolled. HbA1c is pending. Continue on SSI w/ accuchecks. NPO for procedure  HTN: continue on home dose of  metoprolol, lisinopril   RA: continue on home dose of leflunomide      DVT prophylaxis: SCDs of RLE as pt going for procedure today  Code Status:  full  Family Communication:  Disposition Plan: unclear  Level of care: Med-Surg  Status is: Inpatient  Remains inpatient appropriate because: severity of illness, see above      Consultants:  ID  Ortho surg IR  Procedures:  Antimicrobials: vanco, zosyn   Subjective: Pt c/o left leg pain   Objective: Vitals:   05/28/21 0530 05/28/21 0600 05/28/21 0630 05/28/21 0735  BP: (!) 108/57 119/67 102/65   Pulse:  (!) 104 (!) 105   Resp: (!) 30 (!) 29 (!) 23   Temp:    98.5 F (36.9 C)  TempSrc:    Oral  SpO2:  98% 98%   Weight:      Height:       No intake or output data in the 24 hours ending 05/28/21 0826 Filed Weights   05/27/21 0820  Weight: 63.5 kg    Examination:  General exam: Appears calm but uncomfortable  Respiratory system: Clear to auscultation. Respiratory effort normal. Cardiovascular system: S1 & S2 +. No  rubs, gallops or clicks.  Gastrointestinal system: Abdomen is nondistended, soft and nontender.  Normal bowel sounds heard. Central nervous system: Alert and oriented.Moves all extremities  Extremities: LLE has erythema, edema  Psychiatry: Judgement and insight appear normal. Flat mood and affect    Data Reviewed: I have personally reviewed following labs and imaging studies  CBC: Recent Labs  Lab 05/27/21 1210 05/28/21 0537  WBC 18.9* 16.0*  NEUTROABS 16.6*  --   HGB 12.6 11.9*  HCT 37.9 35.9*  MCV 80.1 78.4*  PLT 373 944   Basic Metabolic Panel: Recent Labs  Lab 05/27/21 1210 05/28/21 0222 05/28/21 0537  NA 127* 130* 134*  K 3.9 3.5 3.3*  CL 97* 100 103  CO2 18* 24 24  GLUCOSE 599* 181* 139*  BUN 32* 25* 25*  CREATININE 0.94 0.62 0.70  CALCIUM 9.1 8.7* 8.9   GFR: Estimated Creatinine Clearance: 69.2 mL/min (by C-G formula based on SCr of 0.7 mg/dL). Liver Function  Tests: Recent Labs  Lab 05/27/21 1210  AST 17  ALT 15  ALKPHOS 90  BILITOT 0.8  PROT 7.3  ALBUMIN 3.0*   No results for input(s): LIPASE, AMYLASE in the last 168 hours. No results for input(s): AMMONIA in the last 168 hours. Coagulation Profile: Recent Labs  Lab 05/27/21 1210  INR 1.5*   Cardiac Enzymes: No results for input(s): CKTOTAL, CKMB, CKMBINDEX, TROPONINI in the last 168 hours. BNP (last 3 results) No results for input(s): PROBNP in the last 8760 hours. HbA1C: No results for input(s): HGBA1C in the last 72 hours. CBG: Recent Labs  Lab 05/27/21 2129 05/28/21 0140 05/28/21 0336 05/28/21 0538  GLUCAP 422* 177* 164* 135*   Lipid Profile: No results for input(s): CHOL, HDL, LDLCALC, TRIG, CHOLHDL, LDLDIRECT in the last 72 hours. Thyroid Function Tests: No results for input(s): TSH, T4TOTAL, FREET4, T3FREE, THYROIDAB in the last 72 hours. Anemia Panel: No results for input(s):  VITAMINB12, FOLATE, FERRITIN, TIBC, IRON, RETICCTPCT in the last 72 hours. Sepsis Labs: Recent Labs  Lab 05/27/21 1414 05/27/21 1628 05/28/21 0220 05/28/21 0537  LATICACIDVEN 3.7* 2.1* 1.9 1.7    Recent Results (from the past 240 hour(s))  Resp Panel by RT-PCR (Flu A&B, Covid)     Status: None   Collection Time: 05/27/21  2:53 PM   Specimen: Nasopharyngeal(NP) swabs in vial transport medium  Result Value Ref Range Status   SARS Coronavirus 2 by RT PCR NEGATIVE NEGATIVE Final    Comment: (NOTE) SARS-CoV-2 target nucleic acids are NOT DETECTED.  The SARS-CoV-2 RNA is generally detectable in upper respiratory specimens during the acute phase of infection. The lowest concentration of SARS-CoV-2 viral copies this assay can detect is 138 copies/mL. A negative result does not preclude SARS-Cov-2 infection and should not be used as the sole basis for treatment or other patient management decisions. A negative result may occur with  improper specimen collection/handling, submission of  specimen other than nasopharyngeal swab, presence of viral mutation(s) within the areas targeted by this assay, and inadequate number of viral copies(<138 copies/mL). A negative result must be combined with clinical observations, patient history, and epidemiological information. The expected result is Negative.  Fact Sheet for Patients:  EntrepreneurPulse.com.au  Fact Sheet for Healthcare Providers:  IncredibleEmployment.be  This test is no t yet approved or cleared by the [place] FDA and  has been authorized for detection and/or diagnosis of SARS-CoV-2 by FDA under an Emergency Use Authorization (EUA). This EUA will remain  in effect (meaning this test can be used) for the duration of the COVID-19 declaration under Section 564(b)(1) of the Act, 21 U.S.C.section 360bbb-3(b)(1), unless the authorization is terminated  or revoked sooner.       Influenza A by PCR NEGATIVE NEGATIVE Final   Influenza B by PCR NEGATIVE NEGATIVE Final    Comment: (NOTE) The Xpert Xpress SARS-CoV-2/FLU/RSV plus assay is intended as an aid in the diagnosis of influenza from Nasopharyngeal swab specimens and should not be used as a sole basis for treatment. Nasal washings and aspirates are unacceptable for Xpert Xpress SARS-CoV-2/FLU/RSV testing.  Fact Sheet for Patients: EntrepreneurPulse.com.au  Fact Sheet for Healthcare Providers: IncredibleEmployment.be  This test is not yet approved or cleared by the [place] FDA and has been authorized for detection and/or diagnosis of SARS-CoV-2 by FDA under an Emergency Use Authorization (EUA). This EUA will remain in effect (meaning this test can be used) for the duration of the COVID-19 declaration under Section 564(b)(1) of the Act, 21 U.S.C. section 360bbb-3(b)(1), unless the authorization is terminated or revoked.  Performed at [facility], [address] [place],  [address]          Radiology Studies: CT TIBIA FIBULA LEFT W CONTRAST  Result Date: 05/27/2021 CLINICAL DATA:  Osteomyelitis suspected, tib/fib, xray done EXAM: CT OF THE LOWER LEFT EXTREMITY WITH CONTRAST TECHNIQUE: Multidetector CT imaging of the lower left extremity was performed according to the standard protocol following intravenous contrast administration. CONTRAST:  58m OMNIPAQUE IOHEXOL 300 MG/ML  SOLN COMPARISON:  None. FINDINGS: Bones/Joint/Cartilage There are subchondral insufficiency fractures of the medial and lateral femoral condyles and a probable small bone infarct along the posterior aspect of the medial femoral condyle. Small knee joint effusion. Old healed proximal tibial diaphyseal fracture. Prior lateral plate fixation of the distal fibula with persistent visible fracture line consistent with recent injury. There are few tiny bony fragments inferior to the medial malleolus consistent  with avulsion injuries. There is a healing posterior malleolar fracture with developing callus formation. There is no frank bony destruction to suggest osteomyelitis. Ligaments Suboptimally assessed by CT. Muscles and Tendons There is no evidence of intramuscular collection. There is no significant muscle atrophy. Soft tissues There is skin thickening and superficial soft tissue swelling. No focal/drainable fluid collection by CT. IMPRESSION: Skin thickening and superficial soft tissue swelling of the left lower leg, as can be seen in cellulitis. No drainable collection. Subacute distal fibula, medial malleolar, and posterior malleolar fractures. Old healed proximal fibular diaphyseal fracture. Electronically Signed   By: Maurine Simmering M.D.   On: 05/27/2021 18:50   CT FEMUR LEFT W CONTRAST  Result Date: 05/27/2021 CLINICAL DATA:  Soft tissue mass, thigh, US/xray nondiagnostic Large fluid collection on [place] from groin to knee; edema of extremity EXAM: CT OF THE LOWER RIGHT EXTREMITY WITH  CONTRAST TECHNIQUE: Multidetector CT imaging of the lower right extremity was performed according to the standard protocol following intravenous contrast administration. CONTRAST:  142m OMNIPAQUE IOHEXOL 300 MG/ML  SOLN COMPARISON:  None. 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 There are probable subchondral insufficiency fractures in the medial and lateral femoral condyles Ligaments Suboptimally assessed by CT. Muscles and Tendons There is an intramuscular heterogeneous collection with rim enhancement within the anterior compartment of the thigh which measures approximately 5.8 x 2.3 cm in the axial dimension and 17.5 cm in craniocaudal extent. There is additional rim enhancing fluid collection along the adductor muscles in the medial compartment extending along the entire length of the adductor muscles. There is soft tissue edema within the posterior compartment without definite posterior compartment collection. There is an additional collection which appears to course along the posterolateral aspect of the proximal femur along the greater and lesser trochanters measuring short axis up to 1.8 cm laterally and 1.6 cm posteriorly (series 3, image [id number] Soft tissues Extensive skin thickening and soft tissue swelling of the thigh. In the pelvis, there is an IUD in place. IMPRESSION: Findings are suspicious for cellulitis and severe soft tissue infection involving the anterior and medial compartments of the thigh, with large rim enhancing intramuscular fluid collections, likely abscesses, as described above. Soft tissue  edema without definite collection in the posterior compartment. Additional collection coursing around the greater and lesser trochanters at the proximal femur. Focal bony defect within the greater trochanter adjacent to a rim enhancing collection, with mild sclerosis and periostitis suggesting possible osteomyelitis of the greater trochanter and/or postsurgical change. Subchondral insufficiency fractures of the medial and lateral femoral condyles at the knee. Electronically Signed   By: JMaurine SimmeringM.D.   On: 05/27/2021 14:26   [place]Ven[place] Img Lower Unilateral Left  Result Date: 05/27/2021 CLINICAL DATA:  Swelling left lower extremity EXAM: LEFT LOWER EXTREMITY VENOUS DOPPLER ULTRASOUND TECHNIQUE: Gray-scale sonography with compression, as well as color and duplex ultrasound, were performed to evaluate the deep venous system(s) from the level of the common femoral vein through the popliteal and proximal calf veins. COMPARISON:  None. FINDINGS: VENOUS Normal compressibility of the common femoral, superficial femoral, and popliteal veins, as well as the visualized calf veins. Visualized portions of profunda  femoral vein and great saphenous vein unremarkable. No filling defects to suggest DVT on grayscale or color Doppler imaging. Doppler waveforms show normal direction of venous flow, normal respiratory plasticity and response to augmentation. Limited views of the contralateral common femoral vein are unremarkable. OTHER Large complex fluid collection seen extending from the left groin to just above the left knee measuring 11.3 x 6.4 x 3.6 cm. Limitations: none IMPRESSION: No evidence of left lower extremity DVT. Complex fluid collection extending from the left groin to just above the left knee, question hematoma. This could be further evaluated with CT (preferably with IV contrast) if felt clinically indicated. Electronically Signed   By: Rolm Baptise M.D.   On: 05/27/2021 10:20        Scheduled Meds:   insulin aspart  0-20 Units Subcutaneous Q3H   leflunomide  20 mg Oral Daily   lisinopril  20 mg Oral Daily   metoprolol succinate  50 mg Oral Daily   multivitamin with minerals  1 tablet Oral Daily   Continuous Infusions:  sodium chloride 100 mL/hr at 05/27/21 2329   piperacillin-tazobactam Stopped (05/28/21 0223)   vancomycin       LOS: 1 day    Time spent: 33 mins     Wyvonnia Dusky, MD [facility] Pager [phone]  If 7PM-7AM, please contact night-coverage 05/28/2021, 8:26 AM

## 2021-05-28 NOTE — Consult Note (Signed)
Pharmacy Antibiotic Note  April Chaney is a 50 y.o. female admitted on 05/27/2021 with cellulitis.  Pharmacy has been consulted for Vancomycin dosing. Patient also received 1x doses of Zosyn and Clindamycin in the ED.  05/28/21:  Abx day 2: WBC 18.9> 16, afebrile. 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 IR guided aspiration and drain placement planned  Plan: Modify to Vancomycin 500 mg IV Q 12 hrs. Goal AUC 400-550. Expected AUC: 405 Expected Css: 12.3 SCr used: 0.8   Height: 5' (152.4 cm) Weight: 63.5 kg (140 lb) IBW/kg (Calculated) : 45.5  Temp (24hrs), Avg:99.3 F (37.4 C), Min:98.5 F (36.9 C), Max:100.2 F (37.9 C)  Recent Labs  Lab 05/27/21 1210 05/27/21 1414 05/27/21 1628 05/28/21 0220 05/28/21 0222 05/28/21 0537  WBC 18.9*  --   --   --   --  16.0*  CREATININE 0.94  --   --   --  0.62 0.70  LATICACIDVEN  --  3.7* 2.1* 1.9  --  1.7     Estimated Creatinine Clearance: 69.2 mL/min (by C-G formula based on SCr of 0.7 mg/dL).    Allergies  Allergen Reactions   Pantoprazole Rash    Antimicrobials this admission: Vancomycin 12/5 >>  Zoxyn/Clinda 12/5 x1 Zosyn 12/5 >>  Microbiology results: 12/5 BCx: pending  Thank you for allowing pharmacy to be a part of this patient's care.  Dorothe Pea, PharmD, BCPS Clinical Pharmacist   05/28/2021 10:28 AM

## 2021-05-28 NOTE — Consult Note (Signed)
NAME: April Chaney  DOB: 05-10-1971  MRN: [id number]  Date/Time: 05/28/2021 2:21 PM  REQUESTING PROVIDER: Dr. Jimmye Norman Subjective:  REASON FOR CONSULT: Abscess left leg Chart reviewed.  Patient is  confused  and hence not a good historian. April Chaney is a 50 y.o. female with a history of diabetes mellitus, bilateral total hip arthroplasty, hypertension, rheumatoid arthritis, history of pancreatitis and due to alcohol Presents to the ED brought in by a friend on 05/27/2021 complaining of left-sided groin pain radiating to the thigh and inability to walk.  She had seen Dr. Harlow Mares as outpatient on 05/21/2021 for the hip pain and was diagnosed with left hip trochanteric bursitis and was given a Medrol Dosepak.  He asked her to go meet with the original surgeon who did the arthroplasty. In the ED temperature 98.9, BP 119/78, heart rate 122 and sats 97%.  WBC was 18.9, Hb 12.6 and creatinine 0.94 and platelet 373.  Left leg swelling was noted. CT of the lower extremity showed a left total hip arthroplasty normal alignment without evidence of loosening or periprosthetic fracture.  There was a well demarcated bony defect at the greater trochanter with adjacent periostitis and mild sclerotic changes and adjacent fluid collection along the greater trochanter.  There was intramuscular heterogeneous collection with rim enhancement within the anterior compartment of the thigh measuring 17.5 cm into 5.8 into 2.3 cm.  After blood cultures were sent she was started on PIP Tazo and vancomycin Blood cultures were sent and he was seen by Ortho who recommended transfer to the academic center but no bed was available. IR placed a drain in the abscess collection on 05/28/2021.  I am asked to see the patient for the same. . Patient was hospitalized at [facility] from December 27, 2020 until December 30, 2020 for an occipital abscess which was I&D by surgery on 12/29/2020.  No culture was sent.  She also had eczematous patches  on her legs and that was cultured on 12/28/2020 and that grew out Streptococcus agalactiae from the right leg and on the left leg she did not patch it had rare Acinetobacter and Moraxella species.  Suspect her she was initially on IV vancomycin and Unasyn.  She was discharged home on Augmentin. On reviewing care everywhere there is a note on 01/29/2021 from her PCPs office this is a telephone encounter note.  As per that note patient was hospitalized at 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  She then returned back to the original hospital and that time her mental status was very altered.  She had been taking the following medications donezepil Namenda Seroquel Aspirin Lipitor and omega-3.  It looks like patient had a prolonged hospitalization in [place] and the dates are not clear.  Patient then had visit with her PCP at [facility] on 05/07/2021 for a left leg rash that had been present for quite some time.  During that visit patient did mention she had fractured her left ankle when she was in [place].  It looks like she had a surgery but history not clear.  Past Medical History:  Diagnosis Date   Arthritis    Colon polyps    Complication of anesthesia    woke up during 1 colonscopy 3 yrs ago   Diabetes mellitus without complication ([place])    type 2   DM (diabetes mellitus) ([place])    Family  history of adverse reaction to anesthesia    mother slow to awaken after 1 procedure   GERD (gastroesophageal reflux disease)    Hypertension    hx of no bp meds for last 11 years after weight loss   Pancreatitis 3-4 yrs ago and feb 2018   Presence of artificial hip    has to take antibiotics prior to any procedure-dental, etc   Ulcer     Past Surgical History:  Procedure Laterality Date   COLONOSCOPY WITH PROPOFOL N/A 02/11/2019   Procedure: COLONOSCOPY WITH PROPOFOL;  Surgeon: Lollie Sails, MD;  Location: [facility] ENDOSCOPY;  Service: Endoscopy;  Laterality: N/A;   colonscopy     x 4   ESOPHAGOGASTRODUODENOSCOPY (EGD) WITH PROPOFOL N/A 02/11/2019   Procedure: ESOPHAGOGASTRODUODENOSCOPY (EGD) WITH PROPOFOL;  Surgeon: Lollie Sails, MD;  Location: [facility] ENDOSCOPY;  Service: Endoscopy;  Laterality: N/A;   EUS N/A 01/01/2017   Procedure: UPPER ENDOSCOPIC ULTRASOUND (EUS) LINEAR;  Surgeon: Milus Banister, MD;  Location: [facility] ENDOSCOPY;  Service: Endoscopy;  Laterality: N/A;   FINE NEEDLE ASPIRATION  01/01/2017   Procedure: FINE NEEDLE ASPIRATION;  Surgeon: Milus Banister, MD;  Location: [facility] ENDOSCOPY;  Service: Endoscopy;;   INCISION AND DRAINAGE ABSCESS N/A 12/29/2020   Procedure: INCISION AND DRAINAGE ABSCESS;  Surgeon: Herbert Pun, MD;  Location: [facility] ORS;  Service: General;  Laterality: N/A;   JOINT REPLACEMENT     hip replacement   LEFT HEART CATH AND CORONARY ANGIOGRAPHY N/A 04/26/2019   Procedure: LEFT HEART CATH AND CORONARY ANGIOGRAPHY;  Surgeon: Isaias Cowman, MD;  Location: [place] CV LAB;  Service: Cardiovascular;  Laterality: N/A;   STRABISMUS SURGERY  12/12/2011   Procedure: REPAIR STRABISMUS;  Surgeon: Derry Skill, MD;  Location: [place];  Service: Ophthalmology;  Laterality: Right;   TOTAL HIP ARTHROPLASTY     bilat    Social History   Socioeconomic History   Marital status: Legally Separated    Spouse name: Not on file   Number of children: Not on file   Years of education: Not on file   Highest education level: Not on file  Occupational History   Not on file  Tobacco Use   Smoking status: Some Days    Packs/day: 0.50    Types: Cigarettes   Smokeless tobacco: Never  Vaping Use   Vaping Use: Never used  Substance and Sexual Activity   Alcohol use: Yes    Comment: occ   Drug use: No   Sexual activity: Not on file  Other Topics Concern   Not on file  Social History Narrative   Not on file   Social  Determinants of Health   Financial Resource Strain: Not on file  Food Insecurity: Not on file  Transportation Needs: Not on file  Physical Activity: Not on file  Stress: Not on file  Social Connections: Not on file  Intimate Partner Violence: Not on file    Family History  Problem Relation Age of Onset   Diabetes Mother    Diabetes Father    Hypertension Father    Allergies  Allergen Reactions   Pantoprazole Rash   I? Current Facility-Administered Medications  Medication Dose Route Frequency Provider Last Rate Last Admin   0.9 %  sodium chloride infusion   Intravenous Continuous Mansy, Jan A, MD 100 mL/hr at 05/27/21 2329 New Bag at 05/27/21 2329   acetaminophen (TYLENOL) tablet 650 mg  650 mg Oral Q6H PRN Mansy, Jan A,  MD   650 mg at 05/28/21 0544   Or   acetaminophen (TYLENOL) suppository 650 mg  650 mg Rectal Q6H PRN Mansy, Jan A, MD       insulin aspart (novoLOG) injection 0-20 Units  0-20 Units Subcutaneous Q4H Wyvonnia Dusky, MD       leflunomide (ARAVA) tablet 20 mg  20 mg Oral Daily Mansy, Jan A, MD   20 mg at 05/28/21 1410   lisinopril (ZESTRIL) tablet 20 mg  20 mg Oral Daily Mansy, Jan A, MD   20 mg at 05/28/21 0944   magnesium hydroxide (MILK OF MAGNESIA) suspension 30 mL  30 mL Oral Daily PRN Mansy, Jan A, MD       metoprolol succinate (TOPROL-XL) 24 hr tablet 50 mg  50 mg Oral Daily Mansy, Jan A, MD   50 mg at 05/28/21 0944   morphine 2 MG/ML injection 2 mg  2 mg Intravenous Q4H PRN Wyvonnia Dusky, MD   2 mg at 05/28/21 1301   multivitamin with minerals tablet 1 tablet  1 tablet Oral Daily Mansy, Jan A, MD   1 tablet at 05/28/21 0944   ondansetron (ZOFRAN) tablet 4 mg  4 mg Oral Q6H PRN Mansy, Jan A, MD       Or   ondansetron [facility]) injection 4 mg  4 mg Intravenous Q6H PRN Mansy, Jan A, MD       piperacillin-tazobactam (ZOSYN) IVPB 3.375 g  3.375 g Intravenous Q8H Mansy, Jan A, MD 12.5 mL/hr at 05/28/21 0946 3.375 g at 05/28/21 0946   traZODone (DESYREL)  tablet 25 mg  25 mg Oral QHS PRN Mansy, Jan A, MD   25 mg at 05/27/21 2244   vancomycin (VANCOREADY) IVPB 500 mg/100 mL  500 mg Intravenous Q12H Dorothe Pea, RPH 100 mL/hr at 05/28/21 1057 500 mg at 05/28/21 1057   Current Outpatient Medications  Medication Sig Dispense Refill   aspirin EC 81 MG tablet Take 1 tablet (81 mg total) by mouth daily. Swallow whole. 150 tablet 2   furosemide (LASIX) 20 MG tablet Take 20 mg by mouth daily at 12 noon.     insulin glargine-yfgn (SEMGLEE) 100 UNIT/ML Pen Inject 16 Units into the skin daily.     leflunomide (ARAVA) 20 MG tablet Take 20 mg by mouth daily.     lisinopril (ZESTRIL) 20 MG tablet Take 20 mg by mouth daily.     metFORMIN (GLUCOPHAGE-XR) 500 MG 24 hr tablet Take 1,000 mg by mouth daily with supper.     metoprolol succinate (TOPROL-XL) 50 MG 24 hr tablet Take 50 mg by mouth daily.     Multiple Vitamin (MULTIVITAMIN WITH MINERALS) TABS Take 1 tablet by mouth daily.       Abtx:  Anti-infectives (From admission, onward)    Start     Dose/Rate Route Frequency Ordered Stop   05/28/21 1700  vancomycin (VANCOCIN) IVPB 1000 mg/200 mL premix  Status:  Discontinued        1,000 mg 200 mL/hr over 60 Minutes Intravenous Every 24 hours 05/27/21 2145 05/28/21 1028   05/28/21 1100  vancomycin (VANCOREADY) IVPB 500 mg/100 mL        500 mg 100 mL/hr over 60 Minutes Intravenous Every 12 hours 05/28/21 1028     05/27/21 2315  piperacillin-tazobactam (ZOSYN) IVPB 3.375 g        3.375 g 12.5 mL/hr over 240 Minutes Intravenous Every 8 hours 05/27/21 2247     05/27/21 1500  vancomycin (VANCOREADY) IVPB 1500 mg/300 mL        1,500 mg 150 mL/hr over 120 Minutes Intravenous  Once 05/27/21 1446 05/27/21 1856   05/27/21 1500  piperacillin-tazobactam (ZOSYN) IVPB 3.375 g        3.375 g 100 mL/hr over 30 Minutes Intravenous  Once 05/27/21 1446 05/27/21 1645   05/27/21 1500  clindamycin (CLEOCIN) IVPB 600 mg        600 mg 100 mL/hr over 30 Minutes  Intravenous  Once 05/27/21 1446 05/27/21 1554       REVIEW OF SYSTEMS:  Unable to obtain a reliable review of system as pt is confused She says she has left leg pain and was having trouble walking- her friend brought her in She does not know that she is in [place]: denies fever, negative chills, ++ weight loss Eyes: negative diplopia or visual changes, negative eye pain ENT: negative coryza, negative sore throat Resp: negative cough, hemoptysis, dyspnea Cards: negative for chest pain, palpitations,  GU: negative for frequency, dysuria and hematuria GI: Negative for abdominal pain, diarrhea, bleeding, constipation Skin: negative for rash and pruritus Heme: negative for easy bruising and gum/nose bleeding MS: severe pain left leg and difficulty walking, swelling left leg Neurolo: memory problems  Psych: says she was in [place] in [place]  when her mom passed away and was being given many psych meds which she did not need  Endocrine:  diabetes Allergy/Immunology- negative for any medication or food allergies ?  Objective:  VITALS:  BP 102/65   Pulse (!) 105   Temp 98.9 F (37.2 C) (Oral)   Resp (!) 23   Ht 5' (1.524 m)   Wt 63.5 kg   SpO2 98%   BMI 27.34 kg/m  PHYSICAL EXAM:  General: Awake, confused- not oriented in place or time Follows commands Chronically ill Head: Normocephalic, without obvious abnormality, atraumatic. Eyes: Conjunctivae clear, anicteric sclerae. Pupils are equal ENT Nares normal. No drainage or sinus tenderness. Lips, mucosa, and tongue normal. No Thrush Neck:  symmetrical, no adenopathy, thyroid: non tender no carotid bruit and no JVD. Back: No CVA tenderness. Lungs:b/l air entry Heart: tachycardia Abdomen: Soft, non-tender,not distended. Bowel sounds normal. No masses Extremities: left thigh swollen- drain laterally- purulent brownish fluid Skin: patchy areas of nummular scaly lesions over shin Lymph: Cervical, supraclavicular  normal. Neurologic:cannot assess- looks grossly non focal Pertinent Labs Lab Results CBC    Component Value Date/Time   WBC 16.0 (H) 05/28/2021 0537   RBC 4.58 05/28/2021 0537   HGB 11.9 (L) 05/28/2021 0537   HCT 35.9 (L) 05/28/2021 0537   PLT 322 05/28/2021 0537   MCV 78.4 (L) 05/28/2021 0537   MCH 26.0 05/28/2021 0537   MCHC 33.1 05/28/2021 0537   RDW 15.9 (H) 05/28/2021 0537   LYMPHSABS 1.1 05/27/2021 1210   MONOABS 0.6 05/27/2021 1210   EOSABS 0.0 05/27/2021 1210   BASOSABS 0.0 05/27/2021 1210    CMP Latest Ref Rng & Units 05/28/2021 05/28/2021 05/27/2021  Glucose 70 - 99 mg/dL 139(H) 181(H) 599(HH)  BUN 6 - 20 mg/dL 25(H) 25(H) 32(H)  Creatinine 0.44 - 1.00 mg/dL 0.70 0.62 0.94  Sodium 135 - 145 mmol/L 134(L) 130(L) 127(L)  Potassium 3.5 - 5.1 mmol/L 3.3(L) 3.5 3.9  Chloride 98 - 111 mmol/L 103 100 97(L)  CO2 22 - 32 mmol/L 24 24 18(L)  Calcium 8.9 - 10.3 mg/dL 8.9 8.7(L) 9.1  Total Protein 6.5 - 8.1 g/dL - - 7.3  Total Bilirubin  0.3 - 1.2 mg/dL - - 0.8  Alkaline Phos 38 - 126 U/L - - 90  AST 15 - 41 U/L - - 17  ALT 0 - 44 U/L - - 15      Microbiology: Recent Results (from the past 240 hour(s))  Blood culture (routine x 2)     Status: None (Preliminary result)   Collection Time: 05/27/21  2:14 PM   Specimen: BLOOD  Result Value Ref Range Status   Specimen Description BLOOD RIGHT ANTECUBITAL  Final   Special Requests   Final    BOTTLES DRAWN AEROBIC ONLY Blood Culture results may not be optimal due to an inadequate volume of blood received in culture bottles   Culture   Final    NO GROWTH < 24 HOURS Performed at [facility], [address] [place], [place] [address]    Report Status PENDING  Incomplete  Blood culture (routine x 2)     Status: None (Preliminary result)   Collection Time: 05/27/21  2:14 PM   Specimen: BLOOD  Result Value Ref Range Status   Specimen Description BLOOD BLOOD LEFT FOREARM  Final   Special Requests   Final    BOTTLES  DRAWN AEROBIC ONLY Blood Culture results may not be optimal due to an inadequate volume of blood received in culture bottles   Culture   Final    NO GROWTH < 24 HOURS Performed at [facility], [address], [place], [place] [address]    Report Status PENDING  Incomplete  Resp Panel by RT-PCR (Flu A&B, Covid)     Status: None   Collection Time: 05/27/21  2:53 PM   Specimen: Nasopharyngeal(NP) swabs in vial transport medium  Result Value Ref Range Status   SARS Coronavirus 2 by RT PCR NEGATIVE NEGATIVE Final    Comment: (NOTE) SARS-CoV-2 target nucleic acids are NOT DETECTED.  The SARS-CoV-2 RNA is generally detectable in upper respiratory specimens during the acute phase of infection. The lowest concentration of SARS-CoV-2 viral copies this assay can detect is 138 copies/mL. A negative result does not preclude SARS-Cov-2 infection and should not be used as the sole basis for treatment or other patient management decisions. A negative result may occur with  improper specimen collection/handling, submission of specimen other than nasopharyngeal swab, presence of viral mutation(s) within the areas targeted by this assay, and inadequate number of viral copies(<138 copies/mL). A negative result must be combined with clinical observations, patient history, and epidemiological information. The expected result is Negative.  Fact Sheet for Patients:  EntrepreneurPulse.com.au  Fact Sheet for Healthcare Providers:  IncredibleEmployment.be  This test is no t yet approved or cleared by the [place] FDA and  has been authorized for detection and/or diagnosis of SARS-CoV-2 by FDA under an Emergency Use Authorization (EUA). This EUA will remain  in effect (meaning this test can be used) for the duration of the COVID-19 declaration under Section 564(b)(1) of the Act, 21 U.S.C.section 360bbb-3(b)(1), unless the authorization is terminated  or  revoked sooner.       Influenza A by PCR NEGATIVE NEGATIVE Final   Influenza B by PCR NEGATIVE NEGATIVE Final    Comment: (NOTE) The Xpert Xpress SARS-CoV-2/FLU/RSV plus assay is intended as an aid in the diagnosis of influenza from Nasopharyngeal swab specimens and should not be used as a sole basis for treatment. Nasal washings and aspirates are unacceptable for Xpert Xpress SARS-CoV-2/FLU/RSV testing.  Fact Sheet for Patients: EntrepreneurPulse.com.au  Fact Sheet for  Healthcare Providers: IncredibleEmployment.be  This test is not yet approved or cleared by the [place] and has been authorized for detection and/or diagnosis of SARS-CoV-2 by FDA under an Emergency Use Authorization (EUA). This EUA will remain in effect (meaning this test can be used) for the duration of the COVID-19 declaration under Section 564(b)(1) of the Act, 21 U.S.C. section 360bbb-3(b)(1), unless the authorization is terminated or revoked.  Performed at [facility], [address] [place], [place] [address]     IMAGING RESULTS: Anerior and medial comparment abscess thigh  I have personally reviewed the films Subacute distal fibula, medial malleolar, and posterior malleolar fractures. Old healed proximal fibular diaphyseal fracture. ?   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 Old healed proximal fibular diaphyseal fracture. ? ?I will discuss with ortho  Note:  This document was prepared using Dragon voice recognition software and may include unintentional dictation errors.

## 2021-05-28 NOTE — Progress Notes (Addendum)
Inpatient Diabetes Program Recommendations  AACE/ADA: New Consensus Statement on Inpatient Glycemic Control   Target Ranges:  Prepandial:   less than 140 mg/dL      Peak postprandial:   less than 180 mg/dL (1-2 hours)      Critically ill patients:  140 - 180 mg/dL    Latest Reference Range & Units 05/28/21 01:40 05/28/21 03:36 05/28/21 05:38 05/28/21 09:09  Glucose-Capillary 70 - 99 mg/dL 177 (H) 164 (H) 135 (H) 125 (H)    Latest Reference Range & Units 05/27/21 12:10  CO2 22 - 32 mmol/L 18 (L)  Glucose 70 - 99 mg/dL 599 (HH)  Anion gap 5 - 15  12   Review of Glycemic Control  Diabetes history: DM2 Outpatient Diabetes medications: Metformin XR 1000 mg QPM; Semglee 16 units daily Current orders for Inpatient glycemic control: Novolog 0-20 units Q3H  Inpatient Diabetes Program Recommendations:    Insulin: Please consider changing frequency of CBG to Q4H and Novolog 0-20 units to Q4H.  HbgA1C: A1C in process.  NOTE: Per chart patient admitted with left thigh abscess, sepsis, and initial glucose 599 mg/dl on 05/27/21. In reviewing chart, noted patient sees Dr. Honor Junes (Endocrinologist) and was last seen 12/03/20 (next scheduled appointment 07/08/21. Per office note on 12/03/20 patient was prescribed Semglee 16 units daily and Metformin XR 1000 mg daily for DM control and A1C was 8.1% on 12/03/20. Also noted patient seen Dr. Harlow Mares on 05/21/21 and was prescribed Medrol dose pack which likely contributed to hyperglycemia. Most current glucose 125 mg/dl at 9:09 am today with Novolog correction.  Addendum 05/28/21@10 :50-Spoke with patient over phone to inquire about DM medication regimen. Patient reports that she is prescribed Semglee 16 units daily and Metformin XR 1000 mg daily for DM control. Patient states she takes DM medications as prescribed. Patient is not checking glucose at home but states she has all needed testing supplies.  Discussed glucose and A1C goals. Discussed importance of  checking CBGs and maintaining good CBG control to prevent long-term and short-term complications. Explained how hyperglycemia leads to damage within blood vessels which lead to the common complications seen with uncontrolled diabetes. Stressed to the patient the importance of improving glycemic control to prevent further complications from uncontrolled diabetes especially given abscess. Discussed impact of nutrition, exercise, stress, sickness, and medications on diabetes control.  Informed patient that it was noted that she was recently prescribed Medrol dose pack which likely caused hyperglycemia and will likely cause next A1C to be more elevated. Encouraged patient to check glucose 3-4 times per day and if glucose is consistently over 180 mg/dl then reach out to Dr. Honor Junes to see what adjustments need to be made with DM medications. Patient reports that she has plenty of DM medications at home.   Patient verbalized understanding of information discussed and reports no further questions at this time related to diabetes.  Thanks, Barnie Alderman, RN, MSN, CDE Diabetes Coordinator Inpatient Diabetes Program [phone] (Team Pager from 8am to 5pm)

## 2021-05-29 DIAGNOSIS — L02416 Cutaneous abscess of left lower limb: Secondary | ICD-10-CM | POA: Diagnosis not present

## 2021-05-29 LAB — CBC
HCT: 32.6 % — ABNORMAL LOW (ref 36.0–46.0)
Hemoglobin: 10.8 g/dL — ABNORMAL LOW (ref 12.0–15.0)
MCH: 26.5 pg (ref 26.0–34.0)
MCHC: 33.1 g/dL (ref 30.0–36.0)
MCV: 80.1 fL (ref 80.0–100.0)
Platelets: 287 10*3/uL (ref 150–400)
RBC: 4.07 MIL/uL (ref 3.87–5.11)
RDW: 15.9 % — ABNORMAL HIGH (ref 11.5–15.5)
WBC: 11 10*3/uL — ABNORMAL HIGH (ref 4.0–10.5)
nRBC: 0 % (ref 0.0–0.2)

## 2021-05-29 LAB — GLUCOSE, CAPILLARY
Glucose-Capillary: 118 mg/dL — ABNORMAL HIGH (ref 70–99)
Glucose-Capillary: 139 mg/dL — ABNORMAL HIGH (ref 70–99)
Glucose-Capillary: 151 mg/dL — ABNORMAL HIGH (ref 70–99)
Glucose-Capillary: 166 mg/dL — ABNORMAL HIGH (ref 70–99)
Glucose-Capillary: 233 mg/dL — ABNORMAL HIGH (ref 70–99)
Glucose-Capillary: 343 mg/dL — ABNORMAL HIGH (ref 70–99)

## 2021-05-29 LAB — COMPREHENSIVE METABOLIC PANEL
ALT: 12 U/L (ref 0–44)
AST: 17 U/L (ref 15–41)
Albumin: 2.2 g/dL — ABNORMAL LOW (ref 3.5–5.0)
Alkaline Phosphatase: 60 U/L (ref 38–126)
Anion gap: 7 (ref 5–15)
BUN: 23 mg/dL — ABNORMAL HIGH (ref 6–20)
CO2: 20 mmol/L — ABNORMAL LOW (ref 22–32)
Calcium: 7.9 mg/dL — ABNORMAL LOW (ref 8.9–10.3)
Chloride: 107 mmol/L (ref 98–111)
Creatinine, Ser: 0.54 mg/dL (ref 0.44–1.00)
GFR, Estimated: >60 mL/min (ref >60)
Glucose, Bld: 127 mg/dL — ABNORMAL HIGH (ref 70–99)
Potassium: 3.4 mmol/L — ABNORMAL LOW (ref 3.5–5.1)
Sodium: 134 mmol/L — ABNORMAL LOW (ref 135–145)
Total Bilirubin: 0.9 mg/dL (ref 0.3–1.2)
Total Protein: 5.9 g/dL — ABNORMAL LOW (ref 6.5–8.1)

## 2021-05-29 LAB — MAGNESIUM: Magnesium: 2.1 mg/dL (ref 1.7–2.4)

## 2021-05-29 MED ORDER — POTASSIUM CHLORIDE CRYS ER 20 MEQ PO TBCR
40.0000 meq | EXTENDED_RELEASE_TABLET | Freq: Once | ORAL | Status: AC
  Administered 2021-05-29: 40 meq via ORAL
  Filled 2021-05-29: qty 2

## 2021-05-29 MED ORDER — INSULIN GLARGINE-YFGN 100 UNIT/ML ~~LOC~~ SOPN: 10.0000 [IU] | PEN_INJECTOR | Freq: Every day | SUBCUTANEOUS | Status: DC

## 2021-05-29 MED ORDER — SODIUM CHLORIDE 0.9% FLUSH
5.0000 mL | Freq: Three times a day (TID) | INTRAVENOUS | Status: DC
  Administered 2021-05-29 – 2021-06-05 (×18): 5 mL

## 2021-05-29 MED ORDER — ENOXAPARIN SODIUM 40 MG/0.4ML IJ SOSY
40.0000 mg | PREFILLED_SYRINGE | INTRAMUSCULAR | Status: DC
  Administered 2021-05-29 – 2021-06-04 (×7): 40 mg via SUBCUTANEOUS
  Filled 2021-05-29 (×7): qty 0.4

## 2021-05-29 MED ORDER — INSULIN GLARGINE-YFGN 100 UNIT/ML ~~LOC~~ SOLN
10.0000 [IU] | Freq: Every day | SUBCUTANEOUS | Status: DC
  Administered 2021-05-29: 10 [IU] via SUBCUTANEOUS
  Filled 2021-05-29 (×2): qty 0.1

## 2021-05-29 MED ORDER — INSULIN ASPART 100 UNIT/ML IJ SOLN
0.0000 [IU] | Freq: Three times a day (TID) | INTRAMUSCULAR | Status: DC
Start: 2021-05-30 — End: 2021-06-06
  Administered 2021-05-30: 7 [IU] via SUBCUTANEOUS
  Administered 2021-05-30: 11 [IU] via SUBCUTANEOUS
  Administered 2021-05-31 (×2): 7 [IU] via SUBCUTANEOUS
  Administered 2021-05-31: 6 [IU] via SUBCUTANEOUS
  Administered 2021-06-01: 4 [IU] via SUBCUTANEOUS
  Administered 2021-06-01: 7 [IU] via SUBCUTANEOUS
  Administered 2021-06-01: 3 [IU] via SUBCUTANEOUS
  Administered 2021-06-02 – 2021-06-03 (×4): 4 [IU] via SUBCUTANEOUS
  Administered 2021-06-03: 7 [IU] via SUBCUTANEOUS
  Administered 2021-06-03: 3 [IU] via SUBCUTANEOUS
  Administered 2021-06-04: 7 [IU] via SUBCUTANEOUS
  Administered 2021-06-04: 3 [IU] via SUBCUTANEOUS
  Administered 2021-06-04 – 2021-06-05 (×3): 4 [IU] via SUBCUTANEOUS
  Filled 2021-05-29 (×17): qty 1

## 2021-05-29 MED ORDER — OXYCODONE HCL 5 MG PO TABS
5.0000 mg | ORAL_TABLET | Freq: Four times a day (QID) | ORAL | Status: DC | PRN
  Administered 2021-05-29 – 2021-06-03 (×14): 5 mg via ORAL
  Filled 2021-05-29 (×16): qty 1

## 2021-05-29 NOTE — Progress Notes (Signed)
Referring Physician(s): Eugenie Norrie MD  Supervising Physician: Jacqulynn Cadet  Patient Status:  [facility] - In-pt  Reason for visit: Left lower extremity abscess  S/p successful percutaneous drains placed x 2 anterior thigh and lateral thigh  Subjective: Patient states she feels about the same no worsening LLE pain or swelling, but no significant improvement in pain or swelling either.   Allergies: Pantoprazole  Medications: Prior to Admission medications   Medication Sig Start Date End Date Taking? Authorizing Provider  aspirin EC 81 MG tablet Take 1 tablet (81 mg total) by mouth daily. Swallow whole. 12/30/20 12/30/21 Yes Masoud, Jarrett Soho, MD  furosemide (LASIX) 20 MG tablet Take 20 mg by mouth daily at 12 noon. 04/14/19  Yes [provider]  insulin glargine-yfgn (SEMGLEE) 100 UNIT/ML Pen Inject 16 Units into the skin daily.   Yes [provider]  leflunomide (ARAVA) 20 MG tablet Take 20 mg by mouth daily. 12/19/19  Yes [provider]  lisinopril (ZESTRIL) 20 MG tablet Take 20 mg by mouth daily. 10/29/20  Yes [provider]  metFORMIN (GLUCOPHAGE-XR) 500 MG 24 hr tablet Take 1,000 mg by mouth daily with supper. 05/21/20  Yes [provider]  metoprolol succinate (TOPROL-XL) 50 MG 24 hr tablet Take 50 mg by mouth daily. 07/31/20  Yes [provider]  Multiple Vitamin (MULTIVITAMIN WITH MINERALS) TABS Take 1 tablet by mouth daily.   Yes [provider]    Vital Signs: BP 122/67 (BP Location: Right Arm)   Pulse (!) 107   Temp (!) 100.4 F (38 C)   Resp 16   Ht 5' (1.524 m)   Wt 140 lb (63.5 kg)   SpO2 98%   BMI 27.34 kg/m   Physical Exam  General: Alert, NAD, sitting in bed Left lower extremity: (2) drains in place anterior and lateral with purulent and seropurulent fluid in JP bulbs, drain sites clean and dry, swelling noted, left anterior lateral shin wound present.  Output by Drain (mL) 05/27/21 0701 -  05/27/21 1900 05/27/21 1901 - 05/28/21 0700 05/28/21 0701 - 05/28/21 1900 05/28/21 1901 - 05/29/21 0700 05/29/21 0701 - 05/29/21 1316  Closed System Drain 1 Left;Proximal;Anterior Thigh Bulb (JP)    70   Closed System Drain 2 Left;Anterior Thigh Bulb (JP)    30    Imaging: CT TIBIA FIBULA LEFT W CONTRAST  Result Date: 05/27/2021 CLINICAL DATA:  Osteomyelitis suspected, tib/fib, xray done EXAM: CT OF THE LOWER LEFT EXTREMITY WITH CONTRAST TECHNIQUE: Multidetector CT imaging of the lower left extremity was performed according to the standard protocol following intravenous contrast administration. CONTRAST:  29mL OMNIPAQUE IOHEXOL 300 MG/ML  SOLN COMPARISON:  None. FINDINGS: Bones/Joint/Cartilage There are subchondral insufficiency fractures of the medial and lateral femoral condyles and a probable small bone infarct along the posterior aspect of the medial femoral condyle. Small knee joint effusion. Old healed proximal tibial diaphyseal fracture. Prior lateral plate fixation of the distal fibula with persistent visible fracture line consistent with recent injury. There are few tiny bony fragments inferior to the medial malleolus consistent with avulsion injuries. There is a healing posterior malleolar fracture with developing callus formation. There is no frank bony destruction to suggest osteomyelitis. Ligaments Suboptimally assessed by CT. Muscles and Tendons There is no evidence of intramuscular collection. There is no significant muscle atrophy. Soft tissues There is skin thickening and superficial soft tissue swelling. No focal/drainable fluid collection by CT. IMPRESSION: Skin thickening and superficial soft tissue swelling of the left  lower leg, as can be seen in cellulitis. No drainable collection. Subacute distal fibula, medial malleolar, and posterior malleolar fractures. Old healed proximal fibular diaphyseal fracture. Electronically Signed   By: Maurine Simmering M.D.   On: 05/27/2021 18:50   CT FEMUR  LEFT W CONTRAST  Result Date: 05/27/2021 CLINICAL DATA:  Soft tissue mass, thigh, US/xray nondiagnostic Large fluid collection on [place] from groin to knee; edema of extremity EXAM: CT OF THE LOWER RIGHT EXTREMITY WITH CONTRAST TECHNIQUE: Multidetector CT imaging of the lower right extremity was performed according to the standard protocol following intravenous contrast administration. CONTRAST:  134mL OMNIPAQUE IOHEXOL 300 MG/ML  SOLN COMPARISON:  None. 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 There are probable subchondral insufficiency fractures in the medial and lateral femoral condyles Ligaments Suboptimally assessed by CT. Muscles and Tendons There is an intramuscular heterogeneous collection with rim enhancement within the anterior compartment of the thigh which measures approximately 5.8 x 2.3 cm in the axial dimension and 17.5 cm in craniocaudal extent. There is additional rim enhancing fluid collection along the adductor muscles in the medial compartment extending along the entire length of the adductor muscles. There is soft tissue edema within the posterior compartment without definite posterior compartment collection. There is an additional collection which appears to course along the posterolateral aspect of the proximal femur along the greater and lesser trochanters measuring short axis up to 1.8 cm laterally and 1.6 cm posteriorly (series 3, image [id number] Soft tissues Extensive skin thickening and soft tissue swelling of the thigh. In the pelvis, there is an IUD in place. IMPRESSION: Findings are suspicious  for cellulitis and severe soft tissue infection involving the anterior and medial compartments of the thigh, with large rim enhancing intramuscular fluid collections, likely abscesses, as described above. Soft tissue edema without definite collection in the posterior compartment. Additional collection coursing around the greater and lesser trochanters at the proximal femur. Focal bony defect within the greater trochanter adjacent to a rim enhancing collection, with mild sclerosis and periostitis suggesting possible osteomyelitis of the greater trochanter and/or postsurgical change. Subchondral insufficiency fractures of the medial and lateral femoral condyles at the knee. Electronically Signed   By: Maurine Simmering M.D.   On: 05/27/2021 14:26   US Venous Img Lower Unilateral Left  Result Date: 05/27/2021 CLINICAL DATA:  Swelling left lower extremity EXAM: LEFT LOWER EXTREMITY VENOUS DOPPLER ULTRASOUND TECHNIQUE: Gray-scale sonography with compression, as well as color and duplex ultrasound, were performed to evaluate the deep venous system(s) from the level of the common femoral vein through the popliteal and proximal calf veins. COMPARISON:  None. FINDINGS: VENOUS Normal compressibility of the common femoral, superficial femoral, and popliteal veins, as well as the visualized calf veins. Visualized portions of profunda femoral vein and great saphenous vein unremarkable. No filling defects to suggest DVT on grayscale or color Doppler imaging. Doppler waveforms show normal direction of venous flow, normal respiratory plasticity and response to augmentation. Limited views of the contralateral common femoral vein are unremarkable. OTHER Large complex fluid collection seen extending from the left groin to just above the left knee measuring 11.3 x 6.4 x 3.6 cm. Limitations: none IMPRESSION: No  evidence of left lower extremity DVT. Complex fluid collection extending from the left groin to just above the left knee,  question hematoma. This could be further evaluated with CT (preferably with IV contrast) if felt clinically indicated. Electronically Signed   By: Rolm Baptise M.D.   On: 05/27/2021 10:20   [place] IMAGE GUIDED DRAINAGE BY PERCUTANEOUS CATHETER  Result Date: 05/29/2021 INDICATION: Unfortunate 50 year old female with a history of prior left total hip arthroplasty with evidence of multifocal abscesses in the right hip and thigh. Patient is undergoing workup for transfer for definitive surgical management of these multifocal presumed abscesses. However, excepting facilities are currently full and cannot except her in transfer. Therefore, IR is consulted to aspirate, and if purulent placed percutaneous drainage catheters in an effort to provide some benefit. Ultimately, patient will almost certainly require definitive surgical management. EXAM: Ultrasound-guided drain placement x2 MEDICATIONS: The patient is currently admitted to the hospital and receiving intravenous antibiotics. The antibiotics were administered within an appropriate time frame prior to the initiation of the procedure. ANESTHESIA/SEDATION: See nursing notes in epic. COMPLICATIONS: None immediate. PROCEDURE: Informed written consent was obtained from the patient after a thorough discussion of the procedural risks, benefits and alternatives. All questions were addressed. Maximal Sterile Barrier Technique was utilized including caps, mask, sterile gowns, sterile gloves, sterile drape, hand hygiene and skin antiseptic. A timeout was performed prior to the initiation of the procedure. Ultrasound was used to interrogate the anterior compartment of the left thigh. There is a large complex fluid collection within what appears to be the vastus medialis muscle running from the upper thigh nearly to the knee. A suitable skin entry site was selected and marked. The overlying skin was sterilely prepped and draped in standard fashion using chlorhexidine skin prep.  Local anesthesia was attained by infiltration with 1% lidocaine. A small dermatotomy was made. Under real-time ultrasound guidance, an 18 gauge trocar needle was advanced into the fluid collection. Aspiration yields grossly purulent material. Therefore, a 0.035 wire was advanced along the long axis of the fluid collection down toward the knee. The skin tract was dilated to 12 [place]. A 12 French drainage catheter was modified with additional sideholes and advanced over the wire and into the fluid collection. Aspiration yields approximately 20 mL of thick frankly purulent bloody fluid. A sample was sent for Gram stain and culture. Attention was turned to the lateral aspect of the left thigh. There is a complex appearing fluid collection surrounding the greater trochanter of the femur. In similar fashion, the skin was sterilely prepped and draped with chlorhexidine skin prep. Local anesthesia was attained by infiltration with 1% lidocaine. A new 18 gauge trocar needle was advanced into the fluid collection. Again, aspiration yields similar appearing frankly purulent material. This appears to represent the same process. A wire was advanced through the needle and into the collection. The skin tract was dilated and a 10 French drainage catheter advanced over the wire and into the fluid collection. Aspiration yields approximately 20 mL frankly purulent material. IMPRESSION: 1. Placement of a 12 French drainage catheter modified with additional sideholes into the anterior compartment abscess within the vastus medialis musculature. 2. Placement of a 10 French drainage catheter into the Peri trochanteric fluid collection from a lateral approach. 3. All aspirates were frankly purulent. A sample was sent for Gram stain and culture. Electronically Signed   By: Jacqulynn Cadet M.D.   On: 05/29/2021 10:37   [place] IMAGE GUIDED DRAINAGE BY PERCUTANEOUS CATHETER  Result  Date: 05/28/2021 Criselda Peaches, MD     05/28/2021  5:14  PM Interventional Radiology Procedure Note Procedure: 1.) Placement of 80F drain into left thigh anterior compartment abscess - gross purulence 2.) Placement of 73F drain into the left lateral upper thigh abscess - gross purulence Complications: None Estimated Blood Loss: None Recommendations: - Drains to JP - Flush drains daily - Culture sent Signed, Criselda Peaches, MD    Labs:  CBC: Recent Labs    12/30/20 0639 05/27/21 1210 05/28/21 0537 05/29/21 0306  WBC 11.6* 18.9* 16.0* 11.0*  HGB 11.9* 12.6 11.9* 10.8*  HCT 35.5* 37.9 35.9* 32.6*  PLT 230 373 322 287    COAGS: Recent Labs    05/27/21 1210  INR 1.5*    BMP: Recent Labs    05/27/21 1210 05/28/21 0222 05/28/21 0537 05/29/21 0306  NA 127* 130* 134* 134*  K 3.9 3.5 3.3* 3.4*  CL 97* 100 103 107  CO2 18* 24 24 20*  GLUCOSE 599* 181* 139* 127*  BUN 32* 25* 25* 23*  CALCIUM 9.1 8.7* 8.9 7.9*  CREATININE 0.94 0.62 0.70 0.54  GFRNONAA >60 >60 >60 >60    LIVER FUNCTION TESTS: Recent Labs    12/27/20 0816 12/29/20 0828 05/27/21 1210 05/29/21 0306  BILITOT 0.8 0.6 0.8 0.9  AST 24 19 17 17   ALT 23 15 15 12   ALKPHOS 66 60 90 60  PROT 7.1 6.0* 7.3 5.9*  ALBUMIN 4.2 3.5 3.0* 2.2*    Assessment and Plan: Left groin and thigh swelling, [place] and CT imaging showed multiple rim enhancing intramuscular thigh fluid collections medial, anterior and coursing around the greater and lesser trochanters.   Orthopedics consulted with recommendations of surgical intervention- Given transfer for surgical recommendation is not immediately available, IR has received consult for image guided aspiration and possible drain placement for culture and better control if infection is present.   S/p successful placement of 80F drain 12/6 into left thigh anterior compartment abscess - gross purulence (drain labeled #2)  S/p successful placement of 73F drain 12/6 into the left lateral upper thigh abscess - gross purulence (drain labeled  #1)  Clinically patient feels about the same, fluid Cx gram positive cocci/pending, wbc trend down 11 (16), TMax 100.72F, continue to monitor drain output daily and flush drains daily with sterile saline. Plans per orthopedic team.   Electronically Signed: Hedy Jacob, PA-C 05/29/2021, 1:09 PM   I spent a total of 15 Minutes at the the patient's bedside AND on the patient's hospital floor or unit, greater than 50% of which was counseling/coordinating care for LLE abscess.

## 2021-05-29 NOTE — Plan of Care (Signed)

## 2021-05-29 NOTE — Progress Notes (Signed)
Since admission, lactate, WBC, hyperglycemia, and tachycardia have improved. Highest temperature has been 100.4, but otherwise afebrile.   Regarding findings on CT L tibia, patient states she had ORIF L ankle ~4 months ago while in [place]. She does not have any significant pain in this region despite CT scan showing incomplete bony healing of L ankle.  Yesterday, Patient underwent IR guided aspiration of L anterior thigh and lateral thigh abscesses with drain placement with return of ~20cc gross purulence from each site. Patient states her leg feels relatively similar today. Cx growing gram positive cocci.   Examination of patient remains relatively unchanged with minor decrease in anterior thigh and lateral hip tenderness. Compartments remain soft and compressible. There is significant erythema and swelling about medial thigh extending to calf. Drains in place with purulent appearing material.   I have discussed this case with ID team and primary team. Recommendation would be for repeat CT scan and IR guided aspiration (possibly in a serial fashion) to reduce infectious burden vs attempted transfer to tertiary care center.        Over 30 minutes was spent today discussing findings and potential plans of care with the patient, reviewing prior imaging and medical records, and discussion of case with current care team.

## 2021-05-29 NOTE — Progress Notes (Signed)
PROGRESS NOTE    April Chaney  ONG:[id number] DOB: July 04, 1970 DOA: 05/27/2021 PCP: Marinda Elk, MD  160A/160A-AA   Assessment & Plan:   Principal Problem:   Abscess of left thigh   April Chaney is a 50 y.o. female with medical history significant for type 2 diabetes mellitus, rheumatoid arthritis, hypertension and GERD, coming to the emergency room with acute onset worsening left thigh and groin pain with associated swelling for the last couple weeks worsening over the last week.  She stated it has become throbbing.  She admitted to occasional fever and chills.  She denies any nausea or vomiting or abdominal pain.  No cough or wheezing or dyspnea.  No chest pain or palpitations.  The patient is status post left total hip arthroplasty in 2011 at [facility].  Venous Doppler came back negative for DVT, but was concerning for large fluid collection in the thigh extending from the left groin to just above the knee.  Left femur CT was recommended and it showed findings suspicious for cellulitis and severe soft tissue infection involving the anterior and medial compartment of the thigh with large rim-enhancing intramuscular fluid collections likely abscesses and soft tissue edema without definite collection in the posterior compartment.  There was additional collection coursing around the greater and lesser trochanter at the proximal femur.  There was focal bony defect within the greater trochanter adjacent to a rim-enhancing collection with mild sclerosis and periostitis suggesting possible osteomyelitis of the greater trochanter and/or postsurgical change.  There was subchondral insufficiency fracture of the medial and lateral femoral condyles of the knee.  CT of the left leg/tib/fib showed superficial soft tissue swelling of the left lower leg likely be seen with cellulitis with  no drainable collection and subacute distal fibula, medial malleolar and posterior malleolar fractures  with old healed proximal fibular diaphyseal fracture.  This was done after an x-ray showed suspected osteomyelitis.    The patient has been in the ER for a while with attempts to be transferred to [facility] and [facility] as well as [facility] without success as initial recommendation was made for IR drainage and IV antibiotics.  Contact was made with Dr. Hampton Abbot who recommended orthopedic evaluation given the fact that this abscess is reaching the hip joint space.   Dr. Leim Fabry was contacted and he agreed with patient's admission here and IR consult and drain placement with IV antibiotics.   # Abscesses of left thigh: w/ extension to left joint space.  # S/p IR guided aspiration of L anterior thigh and lateral thigh abscesses with drain placement on 12/6 # Hx of previous left total hip arthroplasty in 2011.  --cont vanc and zosyn --will attempt again for transfer to tertiary center  Severe sepsis:  met criteria w/ tachycardia, tachypnea, leukocytosis, elevated lactic acid and abscess of left thigh.    DM2: poorly uncontrolled.  --A1c 9.2 --resume long-acting at reduced 10u daily --SSI TID   HTN:  --cont home lisinopril and Toprol   RA:  --hold home leflunomide due to severe active infection  Hypokalemia --monitor and replete PRN   DVT prophylaxis: Lovenox SQ Code Status: Full code  Family Communication:  Level of care: Med-Surg Dispo:   The patient is from: home Anticipated d/c is to: undetermined Anticipated d/c date is: undetermined Patient currently is not medically ready to d/c due to: severe infection and sepsis, source uncontrolled, on vanc/zosyn   Subjective and Interval History:  Pain controlled.  Pt reported  no appetite.  Still having fevers.   Objective: Vitals:   05/29/21 0745 05/29/21 1200 05/29/21 1507 05/29/21 1922  BP: 134/73 122/67 128/75 127/78  Pulse: 100 (!) 107 (!) 105 97  Resp: _0 Temp: 98.4 F (36.9 C) (!) 100.4 F  (38 C) (!) 100.4 F (38 C) 99.1 F (37.3 C)  TempSrc: Oral  Oral   SpO2: 99% 98% 99% 99%  Weight:      Height:        Intake/Output Summary (Last 24 hours) at 05/29/2021 1935 Last data filed at 05/29/2021 1924 Gross per 24 hour  Intake 2403.55 ml  Output 1150 ml  Net 1253.55 ml   Filed Weights   05/27/21 0820  Weight: 63.5 kg    Examination:   Constitutional: NAD, AAOx3 HEENT: conjunctivae and lids normal, EOMI CV: No cyanosis.   RESP: normal respiratory effort, on RA Extremities: No effusions, edema in BLE.  2 drains with purulent fluids in the bulbs SKIN: warm, dry Neuro: II - XII grossly intact.   Psych: depressed mood and affect.  Appropriate judgement and reason   Data Reviewed: I have personally reviewed following labs and imaging studies  CBC: Recent Labs  Lab 05/27/21 1210 05/28/21 0537 05/29/21 0306  WBC 18.9* 16.0* 11.0*  NEUTROABS 16.6*  --   --   HGB 12.6 11.9* 10.8*  HCT 37.9 35.9* 32.6*  MCV 80.1 78.4* 80.1  PLT 373 322 948   Basic Metabolic Panel: Recent Labs  Lab 05/27/21 1210 05/28/21 0222 05/28/21 0537 05/29/21 0306  NA 127* 130* 134* 134*  K 3.9 3.5 3.3* 3.4*  CL 97* 100 103 107  CO2 18* 24 24 20*  GLUCOSE 599* 181* 139* 127*  BUN 32* 25* 25* 23*  CREATININE 0.94 0.62 0.70 0.54  CALCIUM 9.1 8.7* 8.9 7.9*  MG  --   --   --  2.1   GFR: Estimated Creatinine Clearance: 69.2 mL/min (by C-G formula based on SCr of 0.54 mg/dL). Liver Function Tests: Recent Labs  Lab 05/27/21 1210 05/29/21 0306  AST 17 17  ALT 15 12  ALKPHOS 90 60  BILITOT 0.8 0.9  PROT 7.3 5.9*  ALBUMIN 3.0* 2.2*   No results for input(s): LIPASE, AMYLASE in the last 168 hours. No results for input(s): AMMONIA in the last 168 hours. Coagulation Profile: Recent Labs  Lab 05/27/21 1210  INR 1.5*   Cardiac Enzymes: No results for input(s): CKTOTAL, CKMB, CKMBINDEX, TROPONINI in the last 168 hours. BNP (last 3 results) No results for input(s): PROBNP in  the last 8760 hours. HbA1C: Recent Labs    05/27/21 1210  HGBA1C 9.2*   CBG: Recent Labs  Lab 05/29/21 0029 05/29/21 0406 05/29/21 0746 05/29/21 1201 05/29/21 1609  GLUCAP 151* 118* 139* 343* 166*   Lipid Profile: No results for input(s): CHOL, HDL, LDLCALC, TRIG, CHOLHDL, LDLDIRECT in the last 72 hours. Thyroid Function Tests: No results for input(s): TSH, T4TOTAL, FREET4, T3FREE, THYROIDAB in the last 72 hours. Anemia Panel: No results for input(s): VITAMINB12, FOLATE, FERRITIN, TIBC, IRON, RETICCTPCT in the last 72 hours. Sepsis Labs: Recent Labs  Lab 05/27/21 1414 05/27/21 1628 05/28/21 0220 05/28/21 0537  LATICACIDVEN 3.7* 2.1* 1.9 1.7    Recent Results (from the past 240 hour(s))  Blood culture (routine x 2)     Status: None (Preliminary result)   Collection Time: 05/27/21  2:14 PM   Specimen: BLOOD  Result Value Ref Range Status   Specimen Description  BLOOD RIGHT ANTECUBITAL  Final   Special Requests   Final    BOTTLES DRAWN AEROBIC ONLY Blood Culture results may not be optimal due to an inadequate volume of blood received in culture bottles   Culture   Final    NO GROWTH 2 DAYS Performed at [facility], [address], [place], [place] [address]    Report Status PENDING  Incomplete  Blood culture (routine x 2)     Status: None (Preliminary result)   Collection Time: 05/27/21  2:14 PM   Specimen: BLOOD  Result Value Ref Range Status   Specimen Description BLOOD BLOOD LEFT FOREARM  Final   Special Requests   Final    BOTTLES DRAWN AEROBIC ONLY Blood Culture results may not be optimal due to an inadequate volume of blood received in culture bottles   Culture   Final    NO GROWTH 2 DAYS Performed at [facility], [address], [place], [place] [address]    Report Status PENDING  Incomplete  Resp Panel by RT-PCR (Flu A&B, Covid)     Status: None   Collection Time: 05/27/21  2:53 PM   Specimen: Nasopharyngeal(NP) swabs in vial  transport medium  Result Value Ref Range Status   SARS Coronavirus 2 by RT PCR NEGATIVE NEGATIVE Final    Comment: (NOTE) SARS-CoV-2 target nucleic acids are NOT DETECTED.  The SARS-CoV-2 RNA is generally detectable in upper respiratory specimens during the acute phase of infection. The lowest concentration of SARS-CoV-2 viral copies this assay can detect is 138 copies/mL. A negative result does not preclude SARS-Cov-2 infection and should not be used as the sole basis for treatment or other patient management decisions. A negative result may occur with  improper specimen collection/handling, submission of specimen other than nasopharyngeal swab, presence of viral mutation(s) within the areas targeted by this assay, and inadequate number of viral copies(<138 copies/mL). A negative result must be combined with clinical observations, patient history, and epidemiological information. The expected result is Negative.  Fact Sheet for Patients:  EntrepreneurPulse.com.au  Fact Sheet for Healthcare Providers:  IncredibleEmployment.be  This test is no t yet approved or cleared by the [place] FDA and  has been authorized for detection and/or diagnosis of SARS-CoV-2 by FDA under an Emergency Use Authorization (EUA). This EUA will remain  in effect (meaning this test can be used) for the duration of the COVID-19 declaration under Section 564(b)(1) of the Act, 21 U.S.C.section 360bbb-3(b)(1), unless the authorization is terminated  or revoked sooner.       Influenza A by PCR NEGATIVE NEGATIVE Final   Influenza B by PCR NEGATIVE NEGATIVE Final    Comment: (NOTE) The Xpert Xpress SARS-CoV-2/FLU/RSV plus assay is intended as an aid in the diagnosis of influenza from Nasopharyngeal swab specimens and should not be used as a sole basis for treatment. Nasal washings and aspirates are unacceptable for Xpert Xpress SARS-CoV-2/FLU/RSV testing.  Fact  Sheet for Patients: EntrepreneurPulse.com.au  Fact Sheet for Healthcare Providers: IncredibleEmployment.be  This test is not yet approved or cleared by the [place] FDA and has been authorized for detection and/or diagnosis of SARS-CoV-2 by FDA under an Emergency Use Authorization (EUA). This EUA will remain in effect (meaning this test can be used) for the duration of the COVID-19 declaration under Section 564(b)(1) of the Act, 21 U.S.C. section 360bbb-3(b)(1), unless the authorization is terminated or revoked.  Performed at [facility], [address] [place], [place] [address]  Aerobic/Anaerobic Culture w Gram Stain (surgical/deep wound)     Status: None (Preliminary result)   Collection Time: 05/28/21  4:50 PM   Specimen: Abscess  Result Value Ref Range Status   Specimen Description   Final    ABSCESS Performed at [facility], [address] [place], [place] [address]    Special Requests   Final    ANTERIOR THIGH Performed at [facility], [place], [place] [address]    Gram Stain   Final    NO SQUAMOUS EPITHELIAL CELLS SEEN FEW WBC SEEN ABUNDANT GRAM POSITIVE COCCI    Culture   Final    TOO YOUNG TO READ Performed at [facility], [place] [address], [place], [place] [address]    Report Status PENDING  Incomplete      Radiology Studies: [place] IMAGE GUIDED DRAINAGE BY PERCUTANEOUS CATHETER  Result Date: 05/29/2021 INDICATION: Unfortunate 50 year old female with a history of prior left total hip arthroplasty with evidence of multifocal abscesses in the right hip and thigh. Patient is undergoing workup for transfer for definitive surgical management of these multifocal presumed abscesses. However, excepting facilities are currently full and cannot except her in transfer. Therefore, IR is consulted to aspirate, and if purulent placed percutaneous drainage catheters in an effort to  provide some benefit. Ultimately, patient will almost certainly require definitive surgical management. EXAM: Ultrasound-guided drain placement x2 MEDICATIONS: The patient is currently admitted to the hospital and receiving intravenous antibiotics. The antibiotics were administered within an appropriate time frame prior to the initiation of the procedure. ANESTHESIA/SEDATION: See nursing notes in epic. COMPLICATIONS: None immediate. PROCEDURE: Informed written consent was obtained from the patient after a thorough discussion of the procedural risks, benefits and alternatives. All questions were addressed. Maximal Sterile Barrier Technique was utilized including caps, mask, sterile gowns, sterile gloves, sterile drape, hand hygiene and skin antiseptic. A timeout was performed prior to the initiation of the procedure. Ultrasound was used to interrogate the anterior compartment of the left thigh. There is a large complex fluid collection within what appears to be the vastus medialis muscle running from the upper thigh nearly to the knee. A suitable skin entry site was selected and marked. The overlying skin was sterilely prepped and draped in standard fashion using chlorhexidine skin prep. Local anesthesia was attained by infiltration with 1% lidocaine. A small dermatotomy was made. Under real-time ultrasound guidance, an 18 gauge trocar needle was advanced into the fluid collection. Aspiration yields grossly purulent material. Therefore, a 0.035 wire was advanced along the long axis of the fluid collection down toward the knee. The skin tract was dilated to 12 [place]. A 12 French drainage catheter was modified with additional sideholes and advanced over the wire and into the fluid collection. Aspiration yields approximately 20 mL of thick frankly purulent bloody fluid. A sample was sent for Gram stain and culture. Attention was turned to the lateral aspect of the left thigh. There is a complex appearing fluid  collection surrounding the greater trochanter of the femur. In similar fashion, the skin was sterilely prepped and draped with chlorhexidine skin prep. Local anesthesia was attained by infiltration with 1% lidocaine. A new 18 gauge trocar needle was advanced into the fluid collection. Again, aspiration yields similar appearing frankly purulent material. This appears to represent the same process. A wire was advanced through the needle and into the collection. The skin tract was dilated and a 10 French drainage catheter advanced over the wire and  into the fluid collection. Aspiration yields approximately 20 mL frankly purulent material. IMPRESSION: 1. Placement of a 12 French drainage catheter modified with additional sideholes into the anterior compartment abscess within the vastus medialis musculature. 2. Placement of a 10 French drainage catheter into the Peri trochanteric fluid collection from a lateral approach. 3. All aspirates were frankly purulent. A sample was sent for Gram stain and culture. Electronically Signed   By: Jacqulynn Cadet M.D.   On: 05/29/2021 10:37   [place] IMAGE GUIDED DRAINAGE BY PERCUTANEOUS CATHETER  Result Date: 05/28/2021 Criselda Peaches, MD     05/28/2021  5:14 PM Interventional Radiology Procedure Note Procedure: 1.) Placement of 40F drain into left thigh anterior compartment abscess - gross purulence 2.) Placement of 27F drain into the left lateral upper thigh abscess - gross purulence Complications: None Estimated Blood Loss: None Recommendations: - Drains to JP - Flush drains daily - Culture sent Signed, Criselda Peaches, MD     Scheduled Meds:  fentaNYL (SUBLIMAZE) injection  50 mcg Intravenous Once   insulin aspart  0-20 Units Subcutaneous Q4H   leflunomide  20 mg Oral Daily   lisinopril  20 mg Oral Daily   metoprolol succinate  50 mg Oral Daily   multivitamin with minerals  1 tablet Oral Daily   sodium chloride flush  5 mL Intracatheter Q8H   sodium chloride  flush  5 mL Intracatheter Q8H   Continuous Infusions:  piperacillin-tazobactam 3.375 g (05/29/21 1614)   vancomycin 500 mg (05/29/21 1117)     LOS: 2 days     Enzo Bi, MD [facility] If 7PM-7AM, please contact night-coverage 05/29/2021, 7:35 PM

## 2021-05-29 NOTE — Progress Notes (Addendum)
Date of Admission:  05/27/2021  retired   ID: April Chaney is a 50 y.o. female  Principal Problem:   Abscess of left thigh    Subjective: Patient is more alert Says she is feeling better Tearful when she is talking about her mother who passed away in 01-23-23   Medications:   enoxaparin (LOVENOX) injection  40 mg Subcutaneous Q24H   fentaNYL (SUBLIMAZE) injection  50 mcg Intravenous Once   [START ON 05/30/2021] insulin aspart  0-20 Units Subcutaneous TID WC   insulin glargine-yfgn  10 Units Subcutaneous QHS   lisinopril  20 mg Oral Daily   metoprolol succinate  50 mg Oral Daily   multivitamin with minerals  1 tablet Oral Daily   sodium chloride flush  5 mL Intracatheter Q8H   sodium chloride flush  5 mL Intracatheter Q8H    Objective: Vital signs in last 24 hours: Temp:  [98.4 F (36.9 C)-100.4 F (38 C)] 99.1 F (37.3 C) (12/07 1922) Pulse Rate:  [93-107] 97 (12/07 1922) Resp:  [16-18] 16 (12/07 1922) BP: (122-134)/(67-78) 127/78 (12/07 1922) SpO2:  [98 %-99 %] 99 % (12/07 1922)  PHYSICAL EXAM:  General: Alert, cooperative, tearful,  Lungs: Clear to auscultation bilaterally. No Wheezing or Rhonchi. No rales. Heart: Regular rate and rhythm, no murmur, rub or gallop. Abdomen: Soft, non-tender,not distended. Bowel sounds normal. No masses Extremities: Left thigh swelling slightly reduced Drain present on the lateral aspect Skin: Scaly patch on the left shin. Lymph: Cervical, supraclavicular normal. Neurologic: Grossly non-focal  Lab Results Recent Labs    05/28/21 0537 05/29/21 0306  WBC 16.0* 11.0*  HGB 11.9* 10.8*  HCT 35.9* 32.6*  NA 134* 134*  K 3.3* 3.4*  CL 103 107  CO2 24 20*  BUN 25* 23*  CREATININE 0.70 0.54   Liver Panel Recent Labs    05/27/21 1210 05/29/21 0306  PROT 7.3 5.9*  ALBUMIN 3.0* 2.2*  AST 17 17  ALT 15 12  ALKPHOS 90 60  BILITOT 0.8 0.9   Sedimentation Rate No results for input(s): ESRSEDRATE in the last 72  hours. C-Reactive Protein No results for input(s): CRP in the last 72 hours.  Microbiology:  Studies/Results: [place] IMAGE GUIDED DRAINAGE BY PERCUTANEOUS CATHETER  Result Date: 05/29/2021 INDICATION: Unfortunate 50 year old female with a history of prior left total hip arthroplasty with evidence of multifocal abscesses in the right hip and thigh. Patient is undergoing workup for transfer for definitive surgical management of these multifocal presumed abscesses. However, excepting facilities are currently full and cannot except her in transfer. Therefore, IR is consulted to aspirate, and if purulent placed percutaneous drainage catheters in an effort to provide some benefit. Ultimately, patient will almost certainly require definitive surgical management. EXAM: Ultrasound-guided drain placement x2 MEDICATIONS: The patient is currently admitted to the hospital and receiving intravenous antibiotics. The antibiotics were administered within an appropriate time frame prior to the initiation of the procedure. ANESTHESIA/SEDATION: See nursing notes in epic. COMPLICATIONS: None immediate. PROCEDURE: Informed written consent was obtained from the patient after a thorough discussion of the procedural risks, benefits and alternatives. All questions were addressed. Maximal Sterile Barrier Technique was utilized including caps, mask, sterile gowns, sterile gloves, sterile drape, hand hygiene and skin antiseptic. A timeout was performed prior to the initiation of the procedure. Ultrasound was used to interrogate the anterior compartment of the left thigh. There is a large complex fluid collection within what appears to be the vastus medialis muscle running from the upper thigh  nearly to the knee. A suitable skin entry site was selected and marked. The overlying skin was sterilely prepped and draped in standard fashion using chlorhexidine skin prep. Local anesthesia was attained by infiltration with 1% lidocaine. A small  dermatotomy was made. Under real-time ultrasound guidance, an 18 gauge trocar needle was advanced into the fluid collection. Aspiration yields grossly purulent material. Therefore, a 0.035 wire was advanced along the long axis of the fluid collection down toward the knee. The skin tract was dilated to 12 [place]. A 12 French drainage catheter was modified with additional sideholes and advanced over the wire and into the fluid collection. Aspiration yields approximately 20 mL of thick frankly purulent bloody fluid. A sample was sent for Gram stain and culture. Attention was turned to the lateral aspect of the left thigh. There is a complex appearing fluid collection surrounding the greater trochanter of the femur. In similar fashion, the skin was sterilely prepped and draped with chlorhexidine skin prep. Local anesthesia was attained by infiltration with 1% lidocaine. A new 18 gauge trocar needle was advanced into the fluid collection. Again, aspiration yields similar appearing frankly purulent material. This appears to represent the same process. A wire was advanced through the needle and into the collection. The skin tract was dilated and a 10 French drainage catheter advanced over the wire and into the fluid collection. Aspiration yields approximately 20 mL frankly purulent material. IMPRESSION: 1. Placement of a 12 French drainage catheter modified with additional sideholes into the anterior compartment abscess within the vastus medialis musculature. 2. Placement of a 10 French drainage catheter into the Peri trochanteric fluid collection from a lateral approach. 3. All aspirates were frankly purulent. A sample was sent for Gram stain and culture. Electronically Signed   By: Jacqulynn Cadet M.D.   On: 05/29/2021 10:37   [place] IMAGE GUIDED DRAINAGE BY PERCUTANEOUS CATHETER  Result Date: 05/28/2021 Criselda Peaches, MD     05/28/2021  5:14 PM Interventional Radiology Procedure Note Procedure: 1.) Placement of  35F drain into left thigh anterior compartment abscess - gross purulence 2.) Placement of 58F drain into the left lateral upper thigh abscess - gross purulence Complications: None Estimated Blood Loss: None Recommendations: - Drains to JP - Flush drains daily - Culture sent Signed, Criselda Peaches, MD     Assessment/Plan: Encephalopathy/confusion could be from infection plus morphine Much better today  Left thigh abscesses extensive deep involving the anterior and medial compartment. Has a IR placed drain.  Discussed with orthopedics.  Needs extensive surgery and has to be transferred to an academic center. 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  Used to be on IL-6 inhibitor   Diabetes mellitus poorly controlled  Hypertension on lisinopril and metoprolol  Past history of skin infections.  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 Patient is tearful and chaplain is waiting outside to visit her  Discussed the management with the care team.

## 2021-05-30 DIAGNOSIS — L02416 Cutaneous abscess of left lower limb: Secondary | ICD-10-CM | POA: Diagnosis not present

## 2021-05-30 LAB — CBC
HCT: 28.1 % — ABNORMAL LOW (ref 36.0–46.0)
Hemoglobin: 9.4 g/dL — ABNORMAL LOW (ref 12.0–15.0)
MCH: 25.8 pg — ABNORMAL LOW (ref 26.0–34.0)
MCHC: 33.5 g/dL (ref 30.0–36.0)
MCV: 77.2 fL — ABNORMAL LOW (ref 80.0–100.0)
Platelets: 287 10*3/uL (ref 150–400)
RBC: 3.64 MIL/uL — ABNORMAL LOW (ref 3.87–5.11)
RDW: 15.9 % — ABNORMAL HIGH (ref 11.5–15.5)
WBC: 10.3 10*3/uL (ref 4.0–10.5)
nRBC: 0 % (ref 0.0–0.2)

## 2021-05-30 LAB — MAGNESIUM: Magnesium: 2 mg/dL (ref 1.7–2.4)

## 2021-05-30 LAB — GLUCOSE, CAPILLARY
Glucose-Capillary: 302 mg/dL — ABNORMAL HIGH (ref 70–99)
Glucose-Capillary: 254 mg/dL — ABNORMAL HIGH (ref 70–99)
Glucose-Capillary: 201 mg/dL — ABNORMAL HIGH (ref 70–99)
Glucose-Capillary: 90 mg/dL (ref 70–99)
Glucose-Capillary: 121 mg/dL — ABNORMAL HIGH (ref 70–99)

## 2021-05-30 LAB — BASIC METABOLIC PANEL
Anion gap: 7 (ref 5–15)
BUN: 14 mg/dL (ref 6–20)
CO2: 20 mmol/L — ABNORMAL LOW (ref 22–32)
Calcium: 7.2 mg/dL — ABNORMAL LOW (ref 8.9–10.3)
Chloride: 105 mmol/L (ref 98–111)
Creatinine, Ser: 0.4 mg/dL — ABNORMAL LOW (ref 0.44–1.00)
GFR, Estimated: >60 mL/min (ref >60)
Glucose, Bld: 302 mg/dL — ABNORMAL HIGH (ref 70–99)
Potassium: 3.7 mmol/L (ref 3.5–5.1)
Sodium: 132 mmol/L — ABNORMAL LOW (ref 135–145)

## 2021-05-30 MED ORDER — LINEZOLID 600 MG PO TABS
600.0000 mg | ORAL_TABLET | Freq: Two times a day (BID) | ORAL | Status: DC
Start: 2021-05-30 — End: 2021-05-31
  Administered 2021-05-30 – 2021-05-31 (×2): 600 mg via ORAL
  Filled 2021-05-30 (×3): qty 1

## 2021-05-30 MED ORDER — VANCOMYCIN HCL 750 MG/150ML IV SOLN
750.0000 mg | Freq: Two times a day (BID) | INTRAVENOUS | Status: DC
  Administered 2021-05-30 – 2021-05-31 (×2): 750 mg via INTRAVENOUS
  Filled 2021-05-30 (×3): qty 150

## 2021-05-30 MED ORDER — INSULIN GLARGINE-YFGN 100 UNIT/ML ~~LOC~~ SOLN
14.0000 [IU] | Freq: Every day | SUBCUTANEOUS | Status: DC
  Administered 2021-05-30 – 2021-06-03 (×5): 14 [IU] via SUBCUTANEOUS
  Filled 2021-05-30 (×6): qty 0.14

## 2021-05-30 NOTE — Progress Notes (Signed)
PROGRESS NOTE    April Chaney  ERX:[id number] DOB: Oct 22, 1970 DOA: 05/27/2021 PCP: Marinda Elk, MD  160A/160A-AA   Assessment & Plan:   Principal Problem:   Abscess of left thigh   April Chaney is a 50 y.o. female with medical history significant for type 2 diabetes mellitus, rheumatoid arthritis, hypertension and GERD, coming to the emergency room with acute onset worsening left thigh and groin pain with associated swelling for the last couple weeks worsening over the last week.  She stated it has become throbbing.  She admitted to occasional fever and chills.  She denies any nausea or vomiting or abdominal pain.  No cough or wheezing or dyspnea.  No chest pain or palpitations.  The patient is status post left total hip arthroplasty in 2011 at [facility].  Venous Doppler came back negative for DVT, but was concerning for large fluid collection in the thigh extending from the left groin to just above the knee.  Left femur CT was recommended and it showed findings suspicious for cellulitis and severe soft tissue infection involving the anterior and medial compartment of the thigh with large rim-enhancing intramuscular fluid collections likely abscesses and soft tissue edema without definite collection in the posterior compartment.  There was additional collection coursing around the greater and lesser trochanter at the proximal femur.  There was focal bony defect within the greater trochanter adjacent to a rim-enhancing collection with mild sclerosis and periostitis suggesting possible osteomyelitis of the greater trochanter and/or postsurgical change.  There was subchondral insufficiency fracture of the medial and lateral femoral condyles of the knee.  CT of the left leg/tib/fib showed superficial soft tissue swelling of the left lower leg likely be seen with cellulitis with  no drainable collection and subacute distal fibula, medial malleolar and posterior malleolar fractures  with old healed proximal fibular diaphyseal fracture.  This was done after an x-ray showed suspected osteomyelitis.    The patient has been in the ER for a while with attempts to be transferred to [facility] and [facility] as well as [facility] without success as initial recommendation was made for IR drainage and IV antibiotics.  Contact was made with Dr. Hampton Abbot who recommended orthopedic evaluation given the fact that this abscess is reaching the hip joint space.   Dr. Leim Fabry was contacted and he agreed with patient's admission here and IR consult and drain placement with IV antibiotics.   # Abscesses of left thigh: w/ extension to left joint space.  # S/p IR guided aspiration of L anterior thigh and lateral thigh abscesses with drain placement on 12/6 # Hx of previous left total hip arthroplasty in 2011.  --continues to spike fever to 100.4 at least once daily.  Will need surgical I/D. Plan: --cont vanc and zosyn --initiated transfer to [facility], waiting to hear back  Severe sepsis:  met criteria w/ tachycardia, tachypnea, leukocytosis, elevated lactic acid and abscess of left thigh.    DM2: poorly uncontrolled.  --A1c 9.2 --cont glargine 10u daily --SSI TID --allow regular diet due to pt not eating well   HTN:  --BP wnl --cont home Lisinopril and Toprol   RA:  --hold home leflunomide due to severe active infection  Hypokalemia --monitor and replete PRN   DVT prophylaxis: Lovenox SQ Code Status: Full code  Family Communication:  Level of care: Med-Surg Dispo:   The patient is from: home Anticipated d/c is to: tertiary center Anticipated d/c date is: undetermined Patient currently is not  medically ready to d/c due to: severe infection and sepsis, source uncontrolled, on vanc/zosyn   Subjective and Interval History:  Pain controlled.  Pt again reported not wanting to eat, however, said she would eat peanut butter/jelly sandwich with milk.      Objective: Vitals:   05/30/21 0432 05/30/21 0736 05/30/21 1125 05/30/21 1549  BP: 137/75 134/82 138/77 131/75  Pulse: 92 90 100 94  Resp: _0 Temp: 98.5 F (36.9 C) 100 F (37.8 C) 99.7 F (37.6 C) 100.2 F (37.9 C)  TempSrc:    Oral  SpO2: 99% 100% 100% 99%  Weight:      Height:        Intake/Output Summary (Last 24 hours) at 05/30/2021 1755 Last data filed at 05/30/2021 1558 Gross per 24 hour  Intake 186.56 ml  Output 1010 ml  Net -823.44 ml   Filed Weights   05/27/21 0820  Weight: 63.5 kg    Examination:   Constitutional: NAD, AAOx3 HEENT: conjunctivae and lids normal, EOMI CV: No cyanosis.   RESP: normal respiratory effort, on RA Extremities: 2 drains from left thigh outputting minimum amount of purulent fluids SKIN: warm, dry Neuro: II - XII grossly intact.   Psych: Normal mood and affect.  Appropriate judgement and reason   Data Reviewed: I have personally reviewed following labs and imaging studies  CBC: Recent Labs  Lab 05/27/21 1210 05/28/21 0537 05/29/21 0306 05/30/21 0324  WBC 18.9* 16.0* 11.0* 10.3  NEUTROABS 16.6*  --   --   --   HGB 12.6 11.9* 10.8* 9.4*  HCT 37.9 35.9* 32.6* 28.1*  MCV 80.1 78.4* 80.1 77.2*  PLT 373 322 287 163   Basic Metabolic Panel: Recent Labs  Lab 05/27/21 1210 05/28/21 0222 05/28/21 0537 05/29/21 0306 05/30/21 0324  NA 127* 130* 134* 134* 132*  K 3.9 3.5 3.3* 3.4* 3.7  CL 97* 100 103 107 105  CO2 18* 24 24 20* 20*  GLUCOSE 599* 181* 139* 127* 302*  BUN 32* 25* 25* 23* 14  CREATININE 0.94 0.62 0.70 0.54 0.40*  CALCIUM 9.1 8.7* 8.9 7.9* 7.2*  MG  --   --   --  2.1 2.0   GFR: Estimated Creatinine Clearance: 69.2 mL/min (A) (by C-G formula based on SCr of 0.4 mg/dL (L)). Liver Function Tests: Recent Labs  Lab 05/27/21 1210 05/29/21 0306  AST 17 17  ALT 15 12  ALKPHOS 90 60  BILITOT 0.8 0.9  PROT 7.3 5.9*  ALBUMIN 3.0* 2.2*   No results for input(s): LIPASE, AMYLASE in the last 168  hours. No results for input(s): AMMONIA in the last 168 hours. Coagulation Profile: Recent Labs  Lab 05/27/21 1210  INR 1.5*   Cardiac Enzymes: No results for input(s): CKTOTAL, CKMB, CKMBINDEX, TROPONINI in the last 168 hours. BNP (last 3 results) No results for input(s): PROBNP in the last 8760 hours. HbA1C: No results for input(s): HGBA1C in the last 72 hours.  CBG: Recent Labs  Lab 05/29/21 2000 05/30/21 0430 05/30/21 0737 05/30/21 1125 05/30/21 1619  GLUCAP 233* 302* 254* 201* 90   Lipid Profile: No results for input(s): CHOL, HDL, LDLCALC, TRIG, CHOLHDL, LDLDIRECT in the last 72 hours. Thyroid Function Tests: No results for input(s): TSH, T4TOTAL, FREET4, T3FREE, THYROIDAB in the last 72 hours. Anemia Panel: No results for input(s): VITAMINB12, FOLATE, FERRITIN, TIBC, IRON, RETICCTPCT in the last 72 hours. Sepsis Labs: Recent Labs  Lab 05/27/21 1414 05/27/21 1628 05/28/21 0220 05/28/21 8466  LATICACIDVEN 3.7* 2.1* 1.9 1.7    Recent Results (from the past 240 hour(s))  Blood culture (routine x 2)     Status: None (Preliminary result)   Collection Time: 05/27/21  2:14 PM   Specimen: BLOOD  Result Value Ref Range Status   Specimen Description BLOOD RIGHT ANTECUBITAL  Final   Special Requests   Final    BOTTLES DRAWN AEROBIC ONLY Blood Culture results may not be optimal due to an inadequate volume of blood received in culture bottles   Culture   Final    NO GROWTH 3 DAYS Performed at [facility], [address], [place], [place] [address]    Report Status PENDING  Incomplete  Blood culture (routine x 2)     Status: None (Preliminary result)   Collection Time: 05/27/21  2:14 PM   Specimen: BLOOD  Result Value Ref Range Status   Specimen Description BLOOD BLOOD LEFT FOREARM  Final   Special Requests   Final    BOTTLES DRAWN AEROBIC ONLY Blood Culture results may not be optimal due to an inadequate volume of blood received in culture bottles    Culture   Final    NO GROWTH 3 DAYS Performed at [facility], [address], [place], [place] [address]    Report Status PENDING  Incomplete  Resp Panel by RT-PCR (Flu A&B, Covid)     Status: None   Collection Time: 05/27/21  2:53 PM   Specimen: Nasopharyngeal(NP) swabs in vial transport medium  Result Value Ref Range Status   SARS Coronavirus 2 by RT PCR NEGATIVE NEGATIVE Final    Comment: (NOTE) SARS-CoV-2 target nucleic acids are NOT DETECTED.  The SARS-CoV-2 RNA is generally detectable in upper respiratory specimens during the acute phase of infection. The lowest concentration of SARS-CoV-2 viral copies this assay can detect is 138 copies/mL. A negative result does not preclude SARS-Cov-2 infection and should not be used as the sole basis for treatment or other patient management decisions. A negative result may occur with  improper specimen collection/handling, submission of specimen other than nasopharyngeal swab, presence of viral mutation(s) within the areas targeted by this assay, and inadequate number of viral copies(<138 copies/mL). A negative result must be combined with clinical observations, patient history, and epidemiological information. The expected result is Negative.  Fact Sheet for Patients:  EntrepreneurPulse.com.au  Fact Sheet for Healthcare Providers:  IncredibleEmployment.be  This test is no t yet approved or cleared by the [place] FDA and  has been authorized for detection and/or diagnosis of SARS-CoV-2 by FDA under an Emergency Use Authorization (EUA). This EUA will remain  in effect (meaning this test can be used) for the duration of the COVID-19 declaration under Section 564(b)(1) of the Act, 21 U.S.C.section 360bbb-3(b)(1), unless the authorization is terminated  or revoked sooner.       Influenza A by PCR NEGATIVE NEGATIVE Final   Influenza B by PCR NEGATIVE NEGATIVE Final    Comment:  (NOTE) The Xpert Xpress SARS-CoV-2/FLU/RSV plus assay is intended as an aid in the diagnosis of influenza from Nasopharyngeal swab specimens and should not be used as a sole basis for treatment. Nasal washings and aspirates are unacceptable for Xpert Xpress SARS-CoV-2/FLU/RSV testing.  Fact Sheet for Patients: EntrepreneurPulse.com.au  Fact Sheet for Healthcare Providers: IncredibleEmployment.be  This test is not yet approved or cleared by the [place] FDA and has been authorized for detection and/or diagnosis of SARS-CoV-2 by FDA under an Emergency Use Authorization (EUA).  This EUA will remain in effect (meaning this test can be used) for the duration of the COVID-19 declaration under Section 564(b)(1) of the Act, 21 U.S.C. section 360bbb-3(b)(1), unless the authorization is terminated or revoked.  Performed at [facility], [address] [place], [place] [address]   Aerobic/Anaerobic Culture w Gram Stain (surgical/deep wound)     Status: None (Preliminary result)   Collection Time: 05/28/21  4:50 PM   Specimen: Abscess  Result Value Ref Range Status   Specimen Description   Final    ABSCESS Performed at [facility], [address], [place], [place] [address]    Special Requests   Final    ANTERIOR THIGH Performed at [facility], [place], [place] [address]    Gram Stain   Final    NO SQUAMOUS EPITHELIAL CELLS SEEN FEW WBC SEEN ABUNDANT GRAM POSITIVE COCCI Performed at [facility], [place] [address], [place],  [address]    Culture   Final    ABUNDANT STAPHYLOCOCCUS AUREUS SUSCEPTIBILITIES TO FOLLOW NO ANAEROBES ISOLATED; CULTURE IN PROGRESS FOR 5 DAYS    Report Status PENDING  Incomplete      Radiology Studies: No results found.   Scheduled Meds:  enoxaparin (LOVENOX) injection  40 mg Subcutaneous Q24H   fentaNYL (SUBLIMAZE) injection  50 mcg Intravenous Once    insulin aspart  0-20 Units Subcutaneous TID WC   insulin glargine-yfgn  14 Units Subcutaneous QHS   linezolid  600 mg Oral Q12H   lisinopril  20 mg Oral Daily   metoprolol succinate  50 mg Oral Daily   multivitamin with minerals  1 tablet Oral Daily   sodium chloride flush  5 mL Intracatheter Q8H   sodium chloride flush  5 mL Intracatheter Q8H   Continuous Infusions:  vancomycin       LOS: 3 days     Enzo Bi, MD [facility] If 7PM-7AM, please contact night-coverage 05/30/2021, 5:55 PM

## 2021-05-30 NOTE — Progress Notes (Signed)
Received phone call from Cisco, RN at [facility]. She was requesting radiology imaging and pt's face sheet to initiate the transfer process. Will inform day team tomorrow.  Fax #: [phone]

## 2021-05-30 NOTE — Progress Notes (Signed)
Inpatient Diabetes Program Recommendations  AACE/ADA: New Consensus Statement on Inpatient Glycemic Control (2015)  Target Ranges:  Prepandial:   less than 140 mg/dL      Peak postprandial:   less than 180 mg/dL (1-2 hours)      Critically ill patients:  140 - 180 mg/dL   Lab Results  Component Value Date   GLUCAP 254 (H) 05/30/2021   HGBA1C 9.2 (H) 05/27/2021    Review of Glycemic Control  Latest Reference Range & Units 05/29/21 07:46 05/29/21 12:01 05/29/21 16:09 05/29/21 20:00 05/30/21 04:30 05/30/21 07:37  Glucose-Capillary 70 - 99 mg/dL 139 (H) 343 (H) 166 (H) 233 (H) 302 (H) 254 (H)  (H): Data is abnormally high Diabetes history: DM2 Outpatient Diabetes medications: Metformin XR 1000 mg QPM; Semglee 16 units daily Current orders for Inpatient glycemic control: Semglee 10 units,Novolog 0-20 tid with meals  Inpatient Diabetes Program Recommendations:   Fasting CBG 254. Patient has received total of Novolog 22 units/24 hrs. Correction. Please consider: -Increase Semglee to 14 units qd -Add Novolog correction 0-5 units hs  Thank you, Nani Gasser. Ahmira Boisselle, RN, MSN, CDE  Diabetes Coordinator Inpatient Glycemic Control Team Team Pager [phone] (8am-5pm) 05/30/2021 9:14 AM

## 2021-05-30 NOTE — Consult Note (Signed)
Pharmacy Antibiotic Note  April Chaney is a 50 y.o. female admitted on 05/27/2021 with cellulitis.  Pharmacy has been consulted for Vancomycin dosing. Patient also received 1x doses of Zosyn and Clindamycin in the ED.  Today, 05/30/2021 Abx day 4:  WBC 18.9 to WNL afebrile. Extensive thigh abscess s/p IR drain x 2 on 12/6 CUlture with S. Aureus, susc pending  Plan: Modify to Vancomycin 750 mg IV Q 12 hrs.  - suspect height is underestimating CrCl, especially when rounding to 0.8 mg/dl Goal AUC 400-600. Expected AUC: 560.5 Expected Css trough: 15.9 SCr used: 0.6 - f/u susceptibilties - ID adding vancomycin and stopping pip/tazo   Height: 5' (152.4 cm) Weight: 63.5 kg (140 lb) IBW/kg (Calculated) : 45.5  Temp (24hrs), Avg:99.3 F (37.4 C), Min:98.3 F (36.8 C), Max:100.2 F (37.9 C)  Recent Labs  Lab 05/27/21 1210 05/27/21 1414 05/27/21 1628 05/28/21 0220 05/28/21 0222 05/28/21 0537 05/29/21 0306 05/30/21 0324  WBC 18.9*  --   --   --   --  16.0* 11.0* 10.3  CREATININE 0.94  --   --   --  0.62 0.70 0.54 0.40*  LATICACIDVEN  --  3.7* 2.1* 1.9  --  1.7  --   --      Estimated Creatinine Clearance: 69.2 mL/min (A) (by C-G formula based on SCr of 0.4 mg/dL (L)).    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  Doreene Eland, PharmD, BCPS, BCIDP Work Cell: [phone] 05/30/2021 4:28 PM

## 2021-05-30 NOTE — Progress Notes (Signed)
Date of Admission:  05/27/2021     ID: April Chaney is a 50 y.o. female  Principal Problem:   Abscess of left thigh    Subjective: Pt feeling better Swelling of the left thigh and pain left thigh present but slightly better She is tearful when talking about her family and does not want me tot alk to any of them- they are all in [place] She was admitted to the hospital in [place] but she doe snot remember why and when Poor appetite Low grade fever  Medications:   enoxaparin (LOVENOX) injection  40 mg Subcutaneous Q24H   fentaNYL (SUBLIMAZE) injection  50 mcg Intravenous Once   insulin aspart  0-20 Units Subcutaneous TID WC   insulin glargine-yfgn  14 Units Subcutaneous QHS   lisinopril  20 mg Oral Daily   metoprolol succinate  50 mg Oral Daily   multivitamin with minerals  1 tablet Oral Daily   sodium chloride flush  5 mL Intracatheter Q8H   sodium chloride flush  5 mL Intracatheter Q8H    Objective: Patient Vitals for the past 24 hrs:  BP Temp Temp src Pulse Resp SpO2  05/30/21 2003 131/77 98.2 F (36.8 C) -- (!) 102 14 97 %  05/30/21 1549 131/75 100.2 F (37.9 C) Oral 94 18 99 %  05/30/21 1125 138/77 99.7 F (37.6 C) -- 100 18 100 %  05/30/21 0736 134/82 100 F (37.8 C) -- 90 16 100 %  05/30/21 0432 137/75 98.5 F (36.9 C) -- 92 16 99 %  05/29/21 2332 138/82 98.3 F (36.8 C) -- 93 16 98 %    PHYSICAL EXAM:  General: Alert, cooperative, no distress, chronically ill  Head: Normocephalic, without obvious abnormality, atraumatic. Eyes: Conjunctivae clear, anicteric sclerae. Pupils are equal ENT Nares normal. No drainage or sinus tenderness. Lips, mucosa, and tongue normal. No Thrush Neck: Supple, symmetrical, no adenopathy, thyroid: non tender no carotid bruit and no JVD. Back: No CVA tenderness. Lungs: Clear to auscultation bilaterally. No Wheezing or Rhonchi. No rales. Heart: Regular rate and rhythm, no murmur, rub or gallop. Abdomen: Soft,  non-tender,not distended. Bowel sounds normal. No masses Extremities:swollen left thigh Drains present Skin: No rashes or lesions. Or bruising Lymph: Cervical, supraclavicular normal. Neurologic: Grossly non-focal  Lab Results Recent Labs    05/29/21 0306 05/30/21 0324  WBC 11.0* 10.3  HGB 10.8* 9.4*  HCT 32.6* 28.1*  NA 134* 132*  K 3.4* 3.7  CL 107 105  CO2 20* 20*  BUN 23* 14  CREATININE 0.54 0.40*   Liver Panel Recent Labs    05/29/21 0306  PROT 5.9*  ALBUMIN 2.2*  AST 17  ALT 12  ALKPHOS 60  BILITOT 0.9   Microbiology: Abscess culture- staph aureus- susceptibility pending BC 05/27/21 NG Studies/Results: [place] IMAGE GUIDED DRAINAGE BY PERCUTANEOUS CATHETER  Result Date: 05/29/2021 INDICATION: Unfortunate 50 year old female with a history of prior left total hip arthroplasty with evidence of multifocal abscesses in the right hip and thigh. Patient is undergoing workup for transfer for definitive surgical management of these multifocal presumed abscesses. However, excepting facilities are currently full and cannot except her in transfer. Therefore, IR is consulted to aspirate, and if purulent placed percutaneous drainage catheters in an effort to provide some benefit. Ultimately, patient will almost certainly require definitive surgical management. EXAM: Ultrasound-guided drain placement x2 MEDICATIONS: The patient is currently admitted to the hospital and receiving intravenous antibiotics. The antibiotics were administered within an appropriate time frame prior to the  initiation of the procedure. ANESTHESIA/SEDATION: See nursing notes in epic. COMPLICATIONS: None immediate. PROCEDURE: Informed written consent was obtained from the patient after a thorough discussion of the procedural risks, benefits and alternatives. All questions were addressed. Maximal Sterile Barrier Technique was utilized including caps, mask, sterile gowns, sterile gloves, sterile drape, hand hygiene and  skin antiseptic. A timeout was performed prior to the initiation of the procedure. Ultrasound was used to interrogate the anterior compartment of the left thigh. There is a large complex fluid collection within what appears to be the vastus medialis muscle running from the upper thigh nearly to the knee. A suitable skin entry site was selected and marked. The overlying skin was sterilely prepped and draped in standard fashion using chlorhexidine skin prep. Local anesthesia was attained by infiltration with 1% lidocaine. A small dermatotomy was made. Under real-time ultrasound guidance, an 18 gauge trocar needle was advanced into the fluid collection. Aspiration yields grossly purulent material. Therefore, a 0.035 wire was advanced along the long axis of the fluid collection down toward the knee. The skin tract was dilated to 12 [place]. A 12 French drainage catheter was modified with additional sideholes and advanced over the wire and into the fluid collection. Aspiration yields approximately 20 mL of thick frankly purulent bloody fluid. A sample was sent for Gram stain and culture. Attention was turned to the lateral aspect of the left thigh. There is a complex appearing fluid collection surrounding the greater trochanter of the femur. In similar fashion, the skin was sterilely prepped and draped with chlorhexidine skin prep. Local anesthesia was attained by infiltration with 1% lidocaine. A new 18 gauge trocar needle was advanced into the fluid collection. Again, aspiration yields similar appearing frankly purulent material. This appears to represent the same process. A wire was advanced through the needle and into the collection. The skin tract was dilated and a 10 French drainage catheter advanced over the wire and into the fluid collection. Aspiration yields approximately 20 mL frankly purulent material. IMPRESSION: 1. Placement of a 12 French drainage catheter modified with additional sideholes into the  anterior compartment abscess within the vastus medialis musculature. 2. Placement of a 10 French drainage catheter into the Peri trochanteric fluid collection from a lateral approach. 3. All aspirates were frankly purulent. A sample was sent for Gram stain and culture. Electronically Signed   By: Jacqulynn Cadet M.D.   On: 05/29/2021 10:37   [place] IMAGE GUIDED DRAINAGE BY PERCUTANEOUS CATHETER  Result Date: 05/28/2021 Criselda Peaches, MD     05/28/2021  5:14 PM Interventional Radiology Procedure Note Procedure: 1.) Placement of 49F drain into left thigh anterior compartment abscess - gross purulence 2.) Placement of 30F drain into the left lateral upper thigh abscess - gross purulence Complications: None Estimated Blood Loss: None Recommendations: - Drains to JP - Flush drains daily - Culture sent Signed, Criselda Peaches, MD     Assessment/Plan: Encephalopathy/confusion--Much better today  Left thigh abscesses extensive, deep involving the anterior and medial compartment. Has a IR placed drain.  Discussed with orthopedics.  Needs extensive surgery and has to be transferred to an academic center. Left hip joint arthroplasty likely infected as well  Staph aureus in culture - on vanco and zosyn- DC zosyn- increase vanco dose appropriately and add linezolid till the susceptibility  is finalized  Repeat CT to look for improvement or need for more drain placement   Rheumatoid arthritis on leflunomide.  Used to be on IL-6 inhibitor  Diabetes mellitus poorly controlled  Hypertension on lisinopril and metoprolol  Past history of skin infections.  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 Pt does not want me to talk to any of her family members  in [place]

## 2021-05-31 ENCOUNTER — Inpatient Hospital Stay: Payer: BC Managed Care – PPO

## 2021-05-31 DIAGNOSIS — A4901 Methicillin susceptible Staphylococcus aureus infection, unspecified site: Secondary | ICD-10-CM

## 2021-05-31 DIAGNOSIS — L02416 Cutaneous abscess of left lower limb: Secondary | ICD-10-CM | POA: Diagnosis not present

## 2021-05-31 LAB — CBC
HCT: 28.6 % — ABNORMAL LOW (ref 36.0–46.0)
Hemoglobin: 9.6 g/dL — ABNORMAL LOW (ref 12.0–15.0)
MCH: 26.4 pg (ref 26.0–34.0)
MCHC: 33.6 g/dL (ref 30.0–36.0)
MCV: 78.6 fL — ABNORMAL LOW (ref 80.0–100.0)
Platelets: 305 10*3/uL (ref 150–400)
RBC: 3.64 MIL/uL — ABNORMAL LOW (ref 3.87–5.11)
RDW: 15.4 % (ref 11.5–15.5)
WBC: 10.5 10*3/uL (ref 4.0–10.5)
nRBC: 0 % (ref 0.0–0.2)

## 2021-05-31 LAB — BASIC METABOLIC PANEL
Anion gap: 8 (ref 5–15)
BUN: 9 mg/dL (ref 6–20)
CO2: 22 mmol/L (ref 22–32)
Calcium: 7.9 mg/dL — ABNORMAL LOW (ref 8.9–10.3)
Chloride: 103 mmol/L (ref 98–111)
Creatinine, Ser: 0.47 mg/dL (ref 0.44–1.00)
GFR, Estimated: >60 mL/min (ref >60)
Glucose, Bld: 171 mg/dL — ABNORMAL HIGH (ref 70–99)
Potassium: 3.5 mmol/L (ref 3.5–5.1)
Sodium: 133 mmol/L — ABNORMAL LOW (ref 135–145)

## 2021-05-31 LAB — GLUCOSE, CAPILLARY
Glucose-Capillary: 223 mg/dL — ABNORMAL HIGH (ref 70–99)
Glucose-Capillary: 181 mg/dL — ABNORMAL HIGH (ref 70–99)
Glucose-Capillary: 228 mg/dL — ABNORMAL HIGH (ref 70–99)
Glucose-Capillary: 287 mg/dL — ABNORMAL HIGH (ref 70–99)

## 2021-05-31 LAB — MAGNESIUM: Magnesium: 1.9 mg/dL (ref 1.7–2.4)

## 2021-05-31 MED ORDER — CEFAZOLIN SODIUM-DEXTROSE 2-4 GM/100ML-% IV SOLN
2.0000 g | Freq: Three times a day (TID) | INTRAVENOUS | Status: DC
  Administered 2021-05-31 – 2021-06-05 (×17): 2 g via INTRAVENOUS
  Filled 2021-05-31 (×18): qty 100

## 2021-05-31 MED ORDER — GLUCERNA SHAKE PO LIQD: 237.0000 mL | Freq: Three times a day (TID) | ORAL | 0 refills | Status: DC

## 2021-05-31 MED ORDER — LEFLUNOMIDE 20 MG PO TABS: ORAL_TABLET | ORAL | Status: AC

## 2021-05-31 MED ORDER — GLUCERNA SHAKE PO LIQD
237.0000 mL | Freq: Three times a day (TID) | ORAL | Status: DC
  Administered 2021-05-31 – 2021-06-05 (×11): 237 mL via ORAL

## 2021-05-31 NOTE — Progress Notes (Signed)
Date of Admission:  05/27/2021     ID: April Chaney is a 50 y.o. female  Principal Problem:   Abscess of left thigh    Subjective: Pt feeling better Swelling of the left thigh and pain left thigh present but slightly better She is very tearful about her mom passing away Medications:   enoxaparin (LOVENOX) injection  40 mg Subcutaneous Q24H   fentaNYL (SUBLIMAZE) injection  50 mcg Intravenous Once   insulin aspart  0-20 Units Subcutaneous TID WC   insulin glargine-yfgn  14 Units Subcutaneous QHS   linezolid  600 mg Oral Q12H   lisinopril  20 mg Oral Daily   metoprolol succinate  50 mg Oral Daily   multivitamin with minerals  1 tablet Oral Daily   sodium chloride flush  5 mL Intracatheter Q8H   sodium chloride flush  5 mL Intracatheter Q8H    Objective: Patient Vitals for the past 24 hrs:  BP Temp Temp src Pulse Resp SpO2  05/31/21 1203 119/68 98.7 F (37.1 C) -- (!) 105 18 99 %  05/31/21 0759 125/72 98.6 F (37 C) -- (!) 103 18 97 %  05/31/21 0254 125/75 99.5 F (37.5 C) -- 94 16 99 %  05/30/21 2003 131/77 98.2 F (36.8 C) -- (!) 102 14 97 %  05/30/21 1549 131/75 100.2 F (37.9 C) Oral 94 18 99 %    PHYSICAL EXAM:  General: Alert, cooperative, no distress, tearful Head: Normocephalic, without obvious abnormality, atraumatic. Eyes: Conjunctivae clear, anicteric sclerae. Pupils are equal ENT Nares normal. No drainage or sinus tenderness. Lips, mucosa, and tongue normal. No Thrush Neck: Supple, symmetrical, no adenopathy, thyroid: non tender no carotid bruit and no JVD. Back: No CVA tenderness. Lungs: Clear to auscultation bilaterally. No Wheezing or Rhonchi. No rales. Heart: Regular rate and rhythm, no murmur, rub or gallop. Abdomen: Soft, non-tender,not distended. Bowel sounds normal. No masses Extremities:swollen left thigh Drains present Skin: No rashes or lesions. Or bruising Lymph: Cervical, supraclavicular normal. Neurologic: Grossly  non-focal  Lab Results Recent Labs    05/30/21 0324 05/31/21 0245  WBC 10.3 10.5  HGB 9.4* 9.6*  HCT 28.1* 28.6*  NA 132* 133*  K 3.7 3.5  CL 105 103  CO2 20* 22  BUN 14 9  CREATININE 0.40* 0.47   Liver Panel Recent Labs    05/29/21 0306  PROT 5.9*  ALBUMIN 2.2*  AST 17  ALT 12  ALKPHOS 60  BILITOT 0.9   Microbiology: Abscess culture- staph aureus-[facility] 05/27/21 NG Studies/Results: DG Ankle Complete Left  Result Date: 05/31/2021 CLINICAL DATA:  Ankle pain, prior ORIF EXAM: LEFT ANKLE COMPLETE - 3+ VIEW COMPARISON:  05/27/2021 FINDINGS: Status post ORIF of the distal fibula. Fixation screws also traverse into the distal tibial metaphysis. Healing distal fibula fracture visualized by plain radiography. Normal alignment. No subluxation or dislocation. No hardware abnormality. Plantar calcaneal spur noted. IMPRESSION: Status post left distal fibula ORIF. Healing distal fibular fracture. No malalignment or complicating feature by plain radiography. Electronically Signed   By: Jerilynn Mages.  Shick M.D.   On: 05/31/2021 10:51   DG HIP UNILAT WITH PELVIS 2-3 VIEWS LEFT  Result Date: 05/31/2021 CLINICAL DATA:  Left thigh abscess EXAM: DG HIP (WITH OR WITHOUT PELVIS) 2-3V LEFT COMPARISON:  None. FINDINGS: Bilateral hip arthroplasties noted. Bony pelvis intact. IUD device projects over the sacrum in the midline of the pelvis. Left hip views demonstrate anatomic alignment. No subluxation or dislocation. No acute osseous finding or hardware abnormality.  Pigtail drains overlie the left proximal thigh. IMPRESSION: Remote bilateral hip arthroplasties. No acute osseous finding or hardware abnormality Proximal left thigh percutaneous drains noted. Electronically Signed   By: Jerilynn Mages.  Shick M.D.   On: 05/31/2021 10:48     Assessment/Plan: Encephalopathy/confusion--Much better today  Left thigh abscesses extensive, deep involving the anterior and medial compartment. Has a IR placed drain.  Discussed with  orthopedics.  Needs extensive surgery and has to be [facility] to an [facility] center. 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 Staph aureus in culture - Spoke to lab- it is MSSA - so will DC vanco and linezolid and switch to cefazolin- will need for atleast 6 weeks - depends on PJI and need for surgery  Repeat CT to look for improvement or need for more drain placement   Rheumatoid arthritis on leflunomide.  Used to be on IL-6 inhibitor   Diabetes mellitus poorly controlled  Hypertension on lisinopril and metoprolol  Past history of skin infections.  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   She would benefit from psych consult as she is tearful and depressed ID will follow her remotely this weekend- call if needed

## 2021-05-31 NOTE — Discharge Summary (Addendum)
Physician Discharge Summary   April Chaney  female DOB: 1970/12/30  LHT:[id number]  PCP: Marinda Elk, MD  Admit date: 05/27/2021 Discharge date: 06/05/2021  Admitted From: home Disposition:  [place]: Full code   Hospital Course:  For full details, please see H&P, progress notes, consult notes and ancillary notes.  Briefly,  April Chaney is a 50 y.o. female with medical history significant for type 2 diabetes mellitus, rheumatoid arthritis, bilateral THA, hypertension who presented to the emergency room with acute onset worsening left thigh and groin pain with associated swelling for the last couple weeks worsening over the last week.    She admitted to occasional fever and chills.  The patient is status post left total hip arthroplasty in 2011 at [facility].   Venous Doppler came back negative for DVT, but was concerning for large fluid collection in the thigh extending from the left groin to just above the knee.  Left femur CT showed abscesses involving the anterior and medial compartment of the thigh.  There was additional collection coursing around the greater and lesser trochanter at the proximal femur.  There was focal bony defect within the greater trochanter adjacent to a rim-enhancing collection with mild sclerosis and periostitis suggesting possible osteomyelitis of the greater trochanter.  There was also subchondral insufficiency fracture of the medial and lateral femoral condyles of the knee.    Of note, pt reported recent left ankle fracture for which she received surgery in [place].  CT of the left leg/tib/fib showed Skin thickening and superficial soft tissue swelling of the left lower leg, as can be seen in cellulitis. No drainable collection.  Also Subacute distal fibula, medial malleolar, and posterior malleolar fractures. Old healed proximal fibular diaphyseal fracture.  Ortho had recommended transfer to tertiary  center, so while pt was in the ED, attempts were made for transfer to [place] as well as [facility], however without success.     # Abscesses of left thigh w/ extension to left joint space.  # S/p IR guided aspiration of L anterior thigh and lateral thigh abscesses with drain placement on 05/28/21 # Hx of previous left total hip arthroplasty in 2011.  --Even after drain placement and being on Vanc and zosyn, pt continues to spike fever to 100.4 at least once daily.   --per our ortho, pt likely requires "multiple surgeries including debridements, possible flap coverage, and likely explant with future revision hip arthroplasty.  Ultimately if infection cannot be appropriately controlled, she may amputation."  Ortho recommended transfer to higher level facility given extensive pathology. --Attempt made again for transfer to [facility] with ortho at [facility] who recommended transfer to [facility] to be seen by Dr. Len Childs (pt's outpatient ortho provider).  Transfer was accepted by hospitalist service at [facility]. --started on vanc/zosyn on presentation.  Cx from abscess grew MSSA, abx narroed to cefazolin on 12/9.  Rifampin added on 12/12 by ID.  Both need to be continued after pt's transfer to [facility].   Severe sepsis:  met criteria w/ tachycardia, tachypnea, leukocytosis, elevated lactic acid and abscess of left thigh.    DM2: poorly uncontrolled.  --A1c 9.2 --discharged on insulin regimen as below. --allowed regular diet due to pt not eating well   HTN:  --BP wnl --cont home Lisinopril and Toprol   RA:  --hold home leflunomide due to severe active infection   Hypokalemia --monitor and  replete PRN  Poor oral intake --pt just didn't want to eat.  Ordered regular diet to give pt more food options, and Glucerna TID, for better wound healing.  Encephalopathy has been ruled out   DKA ruled out   Left buttock ulcer, ruled  out   Discharge Diagnoses:  Principal Problem:   Abscess of left thigh   30 Day Unplanned Readmission Risk Score    Flowsheet Row ED to Hosp-Admission (Current) from 05/27/2021 in [place] (1A)  30 Day Unplanned Readmission Risk Score (%) 18.84 Filed at 05/31/2021 1600       This score is the patient's risk of an unplanned readmission within 30 days of being discharged (0 -100%). The score is based on dignosis, age, lab data, medications, orders, and past utilization.   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 Swallow whole.   feeding supplement (GLUCERNA SHAKE) Liqd Take 237 mLs by mouth 3 (three) times daily between meals.   insulin aspart 100 UNIT/ML injection Commonly known as: novoLOG Inject 7 Units into the skin 3 (three) times daily with meals.   insulin glargine-yfgn 100 UNIT/ML Pen Commonly known as: SEMGLEE Inject 16 Units into the skin daily.   leflunomide 20 MG tablet Commonly known as: ARAVA Hold while having active infection. What changed:  how much to take how to take this when to take this additional instructions   lisinopril 20 MG tablet Commonly known as: ZESTRIL Take 20 mg by mouth daily.   metFORMIN 500 MG 24 hr tablet Commonly known as: GLUCOPHAGE-XR Take 1,000 mg by mouth daily with supper.   metoprolol succinate 50 MG 24 hr tablet Commonly known as: TOPROL-XL Take 50 mg by mouth daily.   multivitamin with minerals Tabs tablet Take 1 tablet by  mouth daily.   oxyCODONE 5 MG immediate release tablet Commonly known as: Oxy IR/ROXICODONE Take 1 tablet (5 mg total) by mouth every 4 (four) hours as needed for severe pain or moderate pain.   rifampin 300 MG capsule Commonly known as: RIFADIN Take 1 capsule (300 mg total) by mouth every 12 (twelve) hours.          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   Consultations:   Procedures/Studies: DG Ankle Complete Left  Result Date: 05/31/2021 CLINICAL DATA:  Ankle pain, prior ORIF EXAM: LEFT ANKLE COMPLETE - 3+ VIEW COMPARISON:  05/27/2021 FINDINGS: Status post ORIF of the distal fibula. Fixation screws also traverse into the distal tibial metaphysis. Healing distal fibula fracture visualized by plain radiography. Normal alignment. No subluxation or dislocation. No hardware abnormality. Plantar calcaneal spur noted. IMPRESSION: Status post left distal fibula  ORIF. Healing distal fibular fracture. No malalignment or complicating feature by plain radiography. Electronically Signed   By: Jerilynn Mages.  Shick M.D.   On: 05/31/2021 10:51   CT TIBIA FIBULA LEFT W CONTRAST  Result Date: 05/27/2021 CLINICAL DATA:  Osteomyelitis suspected, tib/fib, xray done EXAM: CT OF THE LOWER LEFT EXTREMITY WITH CONTRAST TECHNIQUE: Multidetector CT imaging of the lower left extremity was performed according to the standard protocol following intravenous contrast administration. CONTRAST:  73m OMNIPAQUE IOHEXOL 300 MG/ML  SOLN COMPARISON:  None. FINDINGS: Bones/Joint/Cartilage There are subchondral insufficiency fractures of the medial and lateral femoral condyles and a probable small bone infarct along the posterior aspect of the medial  femoral condyle. Small knee joint effusion. Old healed proximal tibial diaphyseal fracture. Prior lateral plate fixation of the distal fibula with persistent visible fracture line consistent with recent injury. There are few tiny bony fragments inferior to the medial malleolus consistent with avulsion injuries. There is a healing posterior malleolar fracture with developing callus formation. There is no frank bony destruction to suggest osteomyelitis. Ligaments Suboptimally assessed by CT. Muscles and Tendons There is no evidence of intramuscular collection. There is no significant muscle atrophy. Soft tissues There is skin thickening and superficial soft tissue swelling. No focal/drainable fluid collection by CT. IMPRESSION: Skin thickening and superficial soft tissue swelling of the left lower leg, as can be seen in cellulitis. No drainable collection. Subacute distal fibula, medial malleolar, and posterior malleolar fractures. Old healed proximal fibular diaphyseal fracture. Electronically Signed   By: JMaurine SimmeringM.D.   On: 05/27/2021 18:50   CT FEMUR LEFT W CONTRAST  Result Date: 05/27/2021 CLINICAL DATA:  Soft tissue mass, thigh, US/xray nondiagnostic Large fluid collection on [place] groin to knee; edema of extremity EXAM: CT OF THE LOWER RIGHT EXTREMITY WITH CONTRAST TECHNIQUE: Multidetector CT imaging of the lower right extremity was performed according to the standard protocol following intravenous contrast administration. CONTRAST:  1028mOMNIPAQUE IOHEXOL 300 MG/ML  SOLN COMPARISON:  None. 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 There is tricompartment  osteoarthritis of the left knee. There are probable subchondral insufficiency fractures in the medial and lateral femoral condyles Ligaments Suboptimally assessed by CT. Muscles and Tendons There is an intramuscular heterogeneous collection with rim enhancement within the anterior compartment of the thigh which measures approximately 5.8 x 2.3 cm in the axial dimension and 17.5 cm in craniocaudal extent. There is additional rim enhancing fluid collection along the adductor muscles in the medial compartment extending along the entire length of the adductor muscles. There is soft tissue edema within the posterior compartment without definite posterior compartment collection. There is an additional collection which appears to course along the posterolateral aspect of the proximal femur along the greater and lesser trochanters measuring short axis up to 1.8 cm laterally and 1.6 cm posteriorly (series 3, image [id number] Soft tissues Extensive skin thickening and soft tissue swelling of the thigh. In the pelvis, there is an IUD in place. IMPRESSION: Findings are suspicious for cellulitis and severe soft tissue infection involving the anterior and medial compartments of the thigh, with large rim enhancing intramuscular fluid collections, likely abscesses, as described above. Soft tissue edema without definite collection in the posterior compartment.  Additional collection coursing around the greater and lesser trochanters at the proximal femur. Focal bony defect within the greater trochanter adjacent to a rim enhancing collection, with mild sclerosis and periostitis suggesting possible osteomyelitis of the greater trochanter and/or postsurgical change. Subchondral insufficiency fractures of the medial and lateral femoral condyles at the knee. Electronically Signed   By: Maurine Simmering M.D.   On: 05/27/2021 14:26   US Venous Img Lower Unilateral Left  Result Date: 05/27/2021 CLINICAL DATA:  Swelling left lower extremity EXAM:  LEFT LOWER EXTREMITY VENOUS DOPPLER ULTRASOUND TECHNIQUE: Gray-scale sonography with compression, as well as color and duplex ultrasound, were performed to evaluate the deep venous system(s) from the level of the common femoral vein through the popliteal and proximal calf veins. COMPARISON:  None. FINDINGS: VENOUS Normal compressibility of the common femoral, superficial femoral, and popliteal veins, as well as the visualized calf veins. Visualized portions of profunda femoral vein and great saphenous vein unremarkable. No filling defects to suggest DVT on grayscale or color Doppler imaging. Doppler waveforms show normal direction of venous flow, normal respiratory plasticity and response to augmentation. Limited views of the contralateral common femoral vein are unremarkable. OTHER Large complex fluid collection seen extending from the left groin to just above the left knee measuring 11.3 x 6.4 x 3.6 cm. Limitations: none IMPRESSION: No evidence of left lower extremity DVT. Complex fluid collection extending from the left groin to just above the left knee, question hematoma. This could be further evaluated with CT (preferably with IV contrast) if felt clinically indicated. Electronically Signed   By: Rolm Baptise M.D.   On: 05/27/2021 10:20   DG HIP UNILAT WITH PELVIS 2-3 VIEWS LEFT  Result Date: 05/31/2021 CLINICAL DATA:  Left thigh abscess EXAM: DG HIP (WITH OR WITHOUT PELVIS) 2-3V LEFT COMPARISON:  None. FINDINGS: Bilateral hip arthroplasties noted. Bony pelvis intact. IUD device projects over the sacrum in the midline of the pelvis. Left hip views demonstrate anatomic alignment. No subluxation or dislocation. No acute osseous finding or hardware abnormality. Pigtail drains overlie the left proximal thigh. IMPRESSION: Remote bilateral hip arthroplasties. No acute osseous finding or hardware abnormality Proximal left thigh percutaneous drains noted. Electronically Signed   By: Jerilynn Mages.  Shick M.D.   On: 05/31/2021  10:48   [place] IMAGE GUIDED DRAINAGE BY PERCUTANEOUS CATHETER  Result Date: 05/29/2021 INDICATION: Unfortunate 50 year old female with a history of prior left total hip arthroplasty with evidence of multifocal abscesses in the right hip and thigh. Patient is undergoing workup for transfer for definitive surgical management of these multifocal presumed abscesses. However, excepting facilities are currently full and cannot except her in transfer. Therefore, IR is consulted to aspirate, and if purulent placed percutaneous drainage catheters in an effort to provide some benefit. Ultimately, patient will almost certainly require definitive surgical management. EXAM: Ultrasound-guided drain placement x2 MEDICATIONS: The patient is currently admitted to the hospital and receiving intravenous antibiotics. The antibiotics were administered within an appropriate time frame prior to the initiation of the procedure. ANESTHESIA/SEDATION: See nursing notes in epic. COMPLICATIONS: None immediate. PROCEDURE: Informed written consent was obtained from the patient after a thorough discussion of the procedural risks, benefits and alternatives. All questions were addressed. Maximal Sterile Barrier Technique was utilized including caps, mask, sterile gowns, sterile gloves, sterile drape, hand hygiene and skin antiseptic. A timeout was performed prior to the initiation of the procedure. Ultrasound was used to interrogate the anterior compartment of the left thigh. There is a large complex fluid collection within  what appears to be the vastus medialis muscle running from the upper thigh nearly to the knee. A suitable skin entry site was selected and marked. The overlying skin was sterilely prepped and draped in standard fashion using chlorhexidine skin prep. Local anesthesia was attained by infiltration with 1% lidocaine. A small dermatotomy was made. Under real-time ultrasound guidance, an 18 gauge trocar needle was advanced into the  fluid collection. Aspiration yields grossly purulent material. Therefore, a 0.035 wire was advanced along the long axis of the fluid collection down toward the knee. The skin tract was dilated to 12 [place]. A 12 French drainage catheter was modified with additional sideholes and advanced over the wire and into the fluid collection. Aspiration yields approximately 20 mL of thick frankly purulent bloody fluid. A sample was sent for Gram stain and culture. Attention was turned to the lateral aspect of the left thigh. There is a complex appearing fluid collection surrounding the greater trochanter of the femur. In similar fashion, the skin was sterilely prepped and draped with chlorhexidine skin prep. Local anesthesia was attained by infiltration with 1% lidocaine. A new 18 gauge trocar needle was advanced into the fluid collection. Again, aspiration yields similar appearing frankly purulent material. This appears to represent the same process. A wire was advanced through the needle and into the collection. The skin tract was dilated and a 10 French drainage catheter advanced over the wire and into the fluid collection. Aspiration yields approximately 20 mL frankly purulent material. IMPRESSION: 1. Placement of a 12 French drainage catheter modified with additional sideholes into the anterior compartment abscess within the vastus medialis musculature. 2. Placement of a 10 French drainage catheter into the Peri trochanteric fluid collection from a lateral approach. 3. All aspirates were frankly purulent. A sample was sent for Gram stain and culture. Electronically Signed   By: Jacqulynn Cadet M.D.   On: 05/29/2021 10:37   [place] IMAGE GUIDED DRAINAGE BY PERCUTANEOUS CATHETER  Result Date: 05/28/2021 Criselda Peaches, MD     05/28/2021  5:14 PM Interventional Radiology Procedure Note Procedure: 1.) Placement of 51F drain into left thigh anterior compartment abscess - gross purulence 2.) Placement of 56F drain into the  left lateral upper thigh abscess - gross purulence Complications: None Estimated Blood Loss: None Recommendations: - Drains to JP - Flush drains daily - Culture sent Signed, Criselda Peaches, MD      Labs: BNP (last 3 results) Recent Labs    12/29/20 0828  BNP 284.1*   Basic Metabolic Panel: Recent Labs  Lab 05/31/21 0245 06/01/21 0544 06/02/21 0513 06/03/21 0253 06/04/21 0301  NA 133* 133* 131* 132* 131*  K 3.5 3.5 3.8 4.0 3.9  CL 103 100 99 99 97*  CO2 22 26 24 26 24   GLUCOSE 171* 261* 160* 205* 217*  BUN 9 8 6 8 9   CREATININE 0.47 0.45 0.49 0.45 0.54  CALCIUM 7.9* 7.8* 7.9* 8.1* 8.1*  MG 1.9 2.0 1.9 2.3 1.9   Liver Function Tests: Recent Labs  Lab 05/29/21 0306  AST 17  ALT 12  ALKPHOS 60  BILITOT 0.9  PROT 5.9*  ALBUMIN 2.2*   No results for input(s): LIPASE, AMYLASE in the last 168 hours. No results for input(s): AMMONIA in the last 168 hours. CBC: Recent Labs  Lab 05/31/21 0245 06/01/21 0544 06/02/21 0513 06/03/21 0253 06/04/21 0301  WBC 10.5 9.7 11.8* 11.5* 13.7*  HGB 9.6* 10.5* 10.0* 9.9* 9.6*  HCT 28.6* 31.4* 30.1* 30.4* 29.4*  MCV 78.6* 79.1* 78.2* 80.2 78.6*  PLT 305 382 455* 523* 609*   Cardiac Enzymes: No results for input(s): CKTOTAL, CKMB, CKMBINDEX, TROPONINI in the last 168 hours. BNP: Invalid input(s): POCBNP CBG: Recent Labs  Lab 06/03/21 2209 06/04/21 0831 06/04/21 1205 06/04/21 1543 06/04/21 2102  GLUCAP 208* 265* 142* 152* 163*   D-Dimer No results for input(s): DDIMER in the last 72 hours. Hgb A1c No results for input(s): HGBA1C in the last 72 hours. Lipid Profile No results for input(s): CHOL, HDL, LDLCALC, TRIG, CHOLHDL, LDLDIRECT in the last 72 hours. Thyroid function studies No results for input(s): TSH, T4TOTAL, T3FREE, THYROIDAB in the last 72 hours.  Invalid input(s): FREET3 Anemia work up No results for input(s): VITAMINB12, FOLATE, FERRITIN, TIBC, IRON, RETICCTPCT in the last 72 hours. Urinalysis     Component Value Date/Time   COLORURINE STRAW (A) 05/27/2021 1414   APPEARANCEUR CLEAR (A) 05/27/2021 1414   LABSPEC 1.045 (H) 05/27/2021 1414   PHURINE 5.0 05/27/2021 1414   GLUCOSEU >=500 (A) 05/27/2021 1414   HGBUR NEGATIVE 05/27/2021 1414   BILIRUBINUR NEGATIVE 05/27/2021 1414   KETONESUR NEGATIVE 05/27/2021 1414   PROTEINUR NEGATIVE 05/27/2021 1414   UROBILINOGEN 0.2 12/04/2013 0759   NITRITE NEGATIVE 05/27/2021 1414   LEUKOCYTESUR NEGATIVE 05/27/2021 1414   Sepsis Labs Invalid input(s): PROCALCITONIN,  WBC,  LACTICIDVEN Microbiology Recent Results (from the past 240 hour(s))  Blood culture (routine x 2)     Status: None   Collection Time: 05/27/21  2:14 PM   Specimen: BLOOD  Result Value Ref Range Status   Specimen Description BLOOD RIGHT ANTECUBITAL  Final   Special Requests   Final    BOTTLES DRAWN AEROBIC ONLY Blood Culture results may not be optimal due to an inadequate volume of blood received in culture bottles   Culture   Final    NO GROWTH 5 DAYS Performed at [facility], [address] [place], [place] [address]    Report Status 06/01/2021 FINAL  Final  Blood culture (routine x 2)     Status: None   Collection Time: 05/27/21  2:14 PM   Specimen: BLOOD  Result Value Ref Range Status   Specimen Description BLOOD BLOOD LEFT FOREARM  Final   Special Requests   Final    BOTTLES DRAWN AEROBIC ONLY Blood Culture results may not be optimal due to an inadequate volume of blood received in culture bottles   Culture   Final    NO GROWTH 5 DAYS Performed at [facility], [address] [place],  [address]    Report Status 06/01/2021 FINAL  Final  Resp Panel by RT-PCR (Flu A&B, Covid)     Status: None   Collection Time: 05/27/21  2:53 PM   Specimen: Nasopharyngeal(NP) swabs in vial transport medium  Result Value Ref Range Status   SARS Coronavirus 2 by RT PCR NEGATIVE NEGATIVE Final    Comment: (NOTE) SARS-CoV-2 target nucleic acids are  NOT DETECTED.  The SARS-CoV-2 RNA is generally detectable in upper respiratory specimens during the acute phase of infection. The lowest concentration of SARS-CoV-2 viral copies this assay can detect is 138 copies/mL. A negative result does not preclude SARS-Cov-2 infection and should not be used as the sole basis for treatment or other patient management decisions. A negative result may occur with  improper specimen collection/handling, submission of specimen other than nasopharyngeal swab, presence of viral mutation(s) within the areas targeted by this assay, and inadequate number of viral copies(<138  copies/mL). A negative result must be combined with clinical observations, patient history, and epidemiological information. The expected result is Negative.  Fact Sheet for Patients:  EntrepreneurPulse.com.au  Fact Sheet for Healthcare Providers:  IncredibleEmployment.be  This test is no t yet approved or cleared by the [place] FDA and  has been authorized for detection and/or diagnosis of SARS-CoV-2 by FDA under an Emergency Use Authorization (EUA). This EUA will remain  in effect (meaning this test can be used) for the duration of the COVID-19 declaration under Section 564(b)(1) of the Act, 21 U.S.C.section 360bbb-3(b)(1), unless the authorization is terminated  or revoked sooner.       Influenza A by PCR NEGATIVE NEGATIVE Final   Influenza B by PCR NEGATIVE NEGATIVE Final    Comment: (NOTE) The Xpert Xpress SARS-CoV-2/FLU/RSV plus assay is intended as an aid in the diagnosis of influenza from Nasopharyngeal swab specimens and should not be used as a sole basis for treatment. Nasal washings and aspirates are unacceptable for Xpert Xpress SARS-CoV-2/FLU/RSV testing.  Fact Sheet for Patients: EntrepreneurPulse.com.au  Fact Sheet for Healthcare Providers: IncredibleEmployment.be  This test is not  yet approved or cleared by the [place] FDA and has been authorized for detection and/or diagnosis of SARS-CoV-2 by FDA under an Emergency Use Authorization (EUA). This EUA will remain in effect (meaning this test can be used) for the duration of the COVID-19 declaration under Section 564(b)(1) of the Act, 21 U.S.C. section 360bbb-3(b)(1), unless the authorization is terminated or revoked.  Performed at [facility], [address] [place], [place] [address]   Aerobic/Anaerobic Culture w Gram Stain (surgical/deep wound)     Status: None   Collection Time: 05/28/21  4:50 PM   Specimen: Abscess  Result Value Ref Range Status   Specimen Description   Final    ABSCESS Performed at [facility], [address], [place], [place] [address]    Special Requests   Final    ANTERIOR THIGH Performed at [facility], [address] [place], [place] [address]    Gram Stain   Final    NO SQUAMOUS EPITHELIAL CELLS SEEN FEW WBC SEEN ABUNDANT GRAM POSITIVE COCCI    Culture   Final    ABUNDANT STAPHYLOCOCCUS AUREUS NO ANAEROBES ISOLATED Performed at [facility], [place] [address] [place],  [address]    Report Status 06/02/2021 FINAL  Final   Organism ID, Bacteria STAPHYLOCOCCUS AUREUS  Final      Susceptibility   Staphylococcus aureus - MIC*    CIPROFLOXACIN <=0.5 SENSITIVE Sensitive     ERYTHROMYCIN <=0.25 SENSITIVE Sensitive     GENTAMICIN <=0.5 SENSITIVE Sensitive     OXACILLIN 0.5 SENSITIVE Sensitive     TETRACYCLINE <=1 SENSITIVE Sensitive     VANCOMYCIN <=0.5 SENSITIVE Sensitive     TRIMETH/SULFA <=10 SENSITIVE Sensitive     CLINDAMYCIN <=0.25 SENSITIVE Sensitive     RIFAMPIN <=0.5 SENSITIVE Sensitive     Inducible Clindamycin NEGATIVE Sensitive     * ABUNDANT STAPHYLOCOCCUS AUREUS     Total time spend on discharging this patient, including the last patient exam, discussing the hospital stay, instructions for ongoing care as it  relates to all pertinent caregivers, as well as preparing the medical discharge records, prescriptions, and/or referrals as applicable, is 30 minutes.    Enzo Bi, MD  [facility] 06/04/2021, 9:31 PM

## 2021-05-31 NOTE — Consult Note (Signed)
Pharmacy Antibiotic Note  April Chaney is a 50 y.o. female admitted on 05/27/2021 with cellulitis.  Pharmacy has been consulted for Vancomycin dosing. Patient also received 1x doses of Zosyn and Clindamycin in the ED.  Today, 05/31/2021 Abx day 5:  WBC 18.9 to WNL afebrile. Extensive thigh abscess s/p IR drain x 2 on 12/6 CUlture with S. Aureus, susc pending  Plan:  Vancomycin 750 mg IV Q 12 hrs.  - suspect height is underestimating CrCl, especially when rounding to 0.8 mg/dl Goal AUC 400-600. Expected AUC: 560.5 Expected Css trough: 15.9 SCr used: 0.6 - f/u susceptibilties - ID adding Linezolid until susceptabilities back and stopping pip/tazo    Height: 5' (152.4 cm) Weight: 63.5 kg (140 lb) IBW/kg (Calculated) : 45.5  Temp (24hrs), Avg:99 F (37.2 C), Min:98.2 F (36.8 C), Max:100.2 F (37.9 C)  Recent Labs  Lab 05/27/21 1210 05/27/21 1414 05/27/21 1628 05/28/21 0220 05/28/21 0222 05/28/21 0537 05/29/21 0306 05/30/21 0324 05/31/21 0245  WBC 18.9*  --   --   --   --  16.0* 11.0* 10.3 10.5  CREATININE 0.94  --   --   --  0.62 0.70 0.54 0.40* 0.47  LATICACIDVEN  --  3.7* 2.1* 1.9  --  1.7  --   --   --      Estimated Creatinine Clearance: 69.2 mL/min (by C-G formula based on SCr of 0.47 mg/dL).    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  Chinita Greenland PharmD Clinical Pharmacist 05/31/2021

## 2021-05-31 NOTE — TOC Initial Note (Signed)
Transition of Care [facility]) - Initial/Assessment Note    Patient Details  Name: April Chaney MRN: [id number] Date of Birth: 1970/11/12  Transition of Care [facility]) CM/SW Contact:    Conception Oms, RN Phone Number: 05/31/2021, 2:38 PM  Clinical Narrative:                 Patient from home, IR placed a drain, The patient was in the ER for a while with attempts to be transferred to [facility] as well as [facility] without success as initial recommendation was made for IR drainage and IV antibiotics, Needs extensive surgery and has to be transferred to an academic center. Per orthopedic Surgery.  The patient does not wish for [place] to contact her family or discuss her case with family in [place] to continue to follow for needs        Patient Goals and CMS Choice        Expected Discharge Plan and Services                                                Prior Living Arrangements/Services                       Activities of Daily Living Home Assistive Devices/Equipment: None ADL Screening (condition at time of admission) Patient's cognitive ability adequate to safely complete daily activities?: Yes Is the patient deaf or have difficulty hearing?: No Does the patient have difficulty seeing, even when wearing glasses/contacts?: No Does the patient have difficulty concentrating, remembering, or making decisions?: No Patient able to express need for assistance with ADLs?: Yes Does the patient have difficulty dressing or bathing?: No Independently performs ADLs?: Yes (appropriate for developmental age) Does the patient have difficulty walking or climbing stairs?: Yes Weakness of Legs: Left Weakness of Arms/Hands: None  Permission Sought/Granted                  Emotional Assessment              Admission diagnosis:  Cellulitis and abscess of leg [L03.119, L02.419] Abscess of left thigh [L02.416] Cellulitis and  abscess of left leg [L03.116, L02.416] Sepsis, due to unspecified organism, unspecified whether acute organ dysfunction present [facility]) [A41.9] Patient Active Problem List   Diagnosis Date Noted   Abscess of left thigh 05/27/2021   Abscess 12/27/2020   Acute CHF (congestive heart failure) ([place]) 04/05/2019   Epigastric pain    Alcohol-induced acute pancreatitis    Abdominal pain 08/03/2016   Encounter for health maintenance examination 10/22/2015   Rheumatoid arthritis involving multiple sites with positive rheumatoid factor ([place]) 10/22/2015   BMI 35.0-35.9,adult 09/23/2015   Elevated rheumatoid factor 09/23/2015   History of adenomatous polyp of colon 09/23/2015   Alopecia 08/18/2015   Benign essential hypertension 08/18/2015   Benign neoplasm of colon 08/18/2015   Epigastric discomfort 08/18/2015   GERD without esophagitis 08/18/2015   Heartburn 08/18/2015   High risk medication use 08/18/2015   Hyperlipidemia 08/18/2015   Hyponatremia 08/18/2015   Infective otitis externa of left ear 08/18/2015   Insomnia 08/18/2015   Neuropathy of both feet 08/18/2015   Nicotine dependence 08/18/2015   Oligomenorrhea 08/18/2015   Obesity 08/18/2015   Gastro-esophageal reflux disease with esophagitis 08/18/2015   Stress incontinence in female 08/18/2015  Type 2 diabetes, uncontrolled, with neuropathy 08/18/2015   Uncontrolled diabetes mellitus with complications 00/86/7619   Vitamin D deficiency 08/18/2015   Wheezing 08/18/2015   Colon polyps 01/06/2014   Pancreatic mass 01/06/2014   Pancreatitis 01/06/2014   PCP:  Marinda Elk, MD Pharmacy:   [facility] [address], [place] [address] [place] [address] Phone: [phone] Fax: [phone]     Social Determinants of Health (SDOH) Interventions    Readmission Risk Interventions No flowsheet data found.

## 2021-06-01 DIAGNOSIS — L02416 Cutaneous abscess of left lower limb: Secondary | ICD-10-CM | POA: Diagnosis not present

## 2021-06-01 LAB — GLUCOSE, CAPILLARY
Glucose-Capillary: 232 mg/dL — ABNORMAL HIGH (ref 70–99)
Glucose-Capillary: 194 mg/dL — ABNORMAL HIGH (ref 70–99)
Glucose-Capillary: 144 mg/dL — ABNORMAL HIGH (ref 70–99)
Glucose-Capillary: 173 mg/dL — ABNORMAL HIGH (ref 70–99)

## 2021-06-01 LAB — CULTURE, BLOOD (ROUTINE X 2)
Culture: NO GROWTH
Culture: NO GROWTH

## 2021-06-01 LAB — BASIC METABOLIC PANEL
Anion gap: 7 (ref 5–15)
BUN: 8 mg/dL (ref 6–20)
CO2: 26 mmol/L (ref 22–32)
Calcium: 7.8 mg/dL — ABNORMAL LOW (ref 8.9–10.3)
Chloride: 100 mmol/L (ref 98–111)
Creatinine, Ser: 0.45 mg/dL (ref 0.44–1.00)
GFR, Estimated: >60 mL/min (ref >60)
Glucose, Bld: 261 mg/dL — ABNORMAL HIGH (ref 70–99)
Potassium: 3.5 mmol/L (ref 3.5–5.1)
Sodium: 133 mmol/L — ABNORMAL LOW (ref 135–145)

## 2021-06-01 LAB — MAGNESIUM: Magnesium: 2 mg/dL (ref 1.7–2.4)

## 2021-06-01 LAB — CBC
HCT: 31.4 % — ABNORMAL LOW (ref 36.0–46.0)
Hemoglobin: 10.5 g/dL — ABNORMAL LOW (ref 12.0–15.0)
MCH: 26.4 pg (ref 26.0–34.0)
MCHC: 33.4 g/dL (ref 30.0–36.0)
MCV: 79.1 fL — ABNORMAL LOW (ref 80.0–100.0)
Platelets: 382 10*3/uL (ref 150–400)
RBC: 3.97 MIL/uL (ref 3.87–5.11)
RDW: 15.5 % (ref 11.5–15.5)
WBC: 9.7 10*3/uL (ref 4.0–10.5)
nRBC: 0 % (ref 0.0–0.2)

## 2021-06-01 MED ORDER — INSULIN ASPART 100 UNIT/ML IJ SOLN
7.0000 [IU] | Freq: Three times a day (TID) | INTRAMUSCULAR | Status: DC
  Administered 2021-06-01 – 2021-06-05 (×11): 7 [IU] via SUBCUTANEOUS
  Filled 2021-06-01 (×12): qty 1

## 2021-06-01 MED ORDER — INSULIN ASPART 100 UNIT/ML IJ SOLN: 7.0000 [IU] | Freq: Three times a day (TID) | INTRAMUSCULAR | 11 refills | Status: AC

## 2021-06-01 NOTE — Progress Notes (Signed)
PROGRESS NOTE    Aerilyn Hartstein  LTJ:[id number] DOB: [birth year] DOA: 05/27/2021 PCP: Marinda Elk, MD  160A/160A-AA   Assessment & Plan:   Principal Problem:   Abscess of left thigh   April Chaney is a 50 y.o. female with medical history significant for type 2 diabetes mellitus, rheumatoid arthritis, hypertension and GERD, coming to the emergency room with acute onset worsening left thigh and groin pain with associated swelling for the last couple weeks worsening over the last week.  She stated it has become throbbing.  She admitted to occasional fever and chills.  She denies any nausea or vomiting or abdominal pain.  No cough or wheezing or dyspnea.  No chest pain or palpitations.  The patient is status post left total hip arthroplasty in 2011 at [facility].  Venous Doppler came back negative for DVT, but was concerning for large fluid collection in the thigh extending from the left groin to just above the knee.  Left femur CT was recommended and it showed findings suspicious for cellulitis and severe soft tissue infection involving the anterior and medial compartment of the thigh with large rim-enhancing intramuscular fluid collections likely abscesses and soft tissue edema without definite collection in the posterior compartment.  There was additional collection coursing around the greater and lesser trochanter at the proximal femur.  There was focal bony defect within the greater trochanter adjacent to a rim-enhancing collection with mild sclerosis and periostitis suggesting possible osteomyelitis of the greater trochanter and/or postsurgical change.  There was subchondral insufficiency fracture of the medial and lateral femoral condyles of the knee.  CT of the left leg/tib/fib showed superficial soft tissue swelling of the left lower leg likely be seen with cellulitis with  no drainable collection and subacute distal fibula, medial malleolar and posterior malleolar fractures  with old healed proximal fibular diaphyseal fracture.  This was done after an x-ray showed suspected osteomyelitis.    The patient has been in the ER for a while with attempts to be transferred to [facility] as well as [facility] without success as initial recommendation was made for IR drainage and IV antibiotics.  Contact was made with Dr. Hampton Abbot who recommended orthopedic evaluation given the fact that this abscess is reaching the hip joint space.   Dr. Leim Fabry was contacted and he agreed with patient's admission here and IR consult and drain placement with IV antibiotics.   # Abscesses of left thigh: w/ extension to left joint space.  # S/p IR guided aspiration of L anterior thigh and lateral thigh abscesses with drain placement on 12/6 # Hx of previous left total hip arthroplasty in 2011.  --continues to spike fever to 100.4 at least once daily.  Will need surgical I/D. --Cx from abscess grew MSSA Plan: --abx narrowed to cefazolin today --pending transfer to [facility]  Severe sepsis:  met criteria w/ tachycardia, tachypnea, leukocytosis, elevated lactic acid and abscess of left thigh.    DM2: poorly uncontrolled.  --A1c 9.2 --cont glargine 14u daily --SSI TID --allow regular diet due to pt not eating well   HTN:  --BP wnl --cont home Lisinopril and Toprol   RA:  --hold home leflunomide due to severe active infection  Hypokalemia --monitor and replete PRN  Poor oral intake --allow regular diet --Ensure supplement   DVT prophylaxis: Lovenox SQ Code Status: Full code  Family Communication:  Level of care: Med-Surg Dispo:   The patient is from: home Anticipated  d/c is to: [facility] Anticipated d/c date is: whenever bed available   Subjective and Interval History:  No more fevers for the past day.  Pt was accepted for transfer to [facility].   Objective: Vitals:   06/01/21 0455 06/01/21  0818 06/01/21 1149 06/01/21 1500  BP: 136/80 122/74 (!) 156/86 139/81  Pulse: 96 95 92 (!) 102  Resp: _0 Temp: 98.3 F (36.8 C) 98.2 F (36.8 C) 98.1 F (36.7 C) 98.6 F (37 C)  TempSrc: Oral   Oral  SpO2: 100% 99% 100%   Weight:      Height:        Intake/Output Summary (Last 24 hours) at 06/01/2021 1703 Last data filed at 06/01/2021 1100 Gross per 24 hour  Intake 227.28 ml  Output 1610 ml  Net -1382.72 ml   Filed Weights   05/27/21 0820  Weight: 63.5 kg    Examination:   Constitutional: NAD, AAOx3 HEENT: conjunctivae and lids normal, EOMI CV: No cyanosis.   RESP: normal respiratory effort, on RA Extremities: 2 drains from left thigh, minimum output SKIN: warm, dry Neuro: II - XII grossly intact.   Psych: depressed mood and affect.  Appropriate judgement and reason   Data Reviewed: I have personally reviewed following labs and imaging studies  CBC: Recent Labs  Lab 05/27/21 1210 05/28/21 0537 05/29/21 0306 05/30/21 0324 05/31/21 0245 06/01/21 0544  WBC 18.9* 16.0* 11.0* 10.3 10.5 9.7  NEUTROABS 16.6*  --   --   --   --   --   HGB 12.6 11.9* 10.8* 9.4* 9.6* 10.5*  HCT 37.9 35.9* 32.6* 28.1* 28.6* 31.4*  MCV 80.1 78.4* 80.1 77.2* 78.6* 79.1*  PLT 373 322 287 287 305 539   Basic Metabolic Panel: Recent Labs  Lab 05/28/21 0537 05/29/21 0306 05/30/21 0324 05/31/21 0245 06/01/21 0544  NA 134* 134* 132* 133* 133*  K 3.3* 3.4* 3.7 3.5 3.5  CL 103 107 105 103 100  CO2 24 20* 20* 22 26  GLUCOSE 139* 127* 302* 171* 261*  BUN 25* 23* _1 CREATININE 0.70 0.54 0.40* 0.47 0.45  CALCIUM 8.9 7.9* 7.2* 7.9* 7.8*  MG  --  2.1 2.0 1.9 2.0   GFR: Estimated Creatinine Clearance: 69.2 mL/min (by C-G formula based on SCr of 0.45 mg/dL). Liver Function Tests: Recent Labs  Lab 05/27/21 1210 05/29/21 0306  AST 17 17  ALT 15 12  ALKPHOS 90 60  BILITOT 0.8 0.9  PROT 7.3 5.9*  ALBUMIN 3.0* 2.2*   No results for input(s): LIPASE, AMYLASE in the  last 168 hours. No results for input(s): AMMONIA in the last 168 hours. Coagulation Profile: Recent Labs  Lab 05/27/21 1210  INR 1.5*   Cardiac Enzymes: No results for input(s): CKTOTAL, CKMB, CKMBINDEX, TROPONINI in the last 168 hours. BNP (last 3 results) No results for input(s): PROBNP in the last 8760 hours. HbA1C: No results for input(s): HGBA1C in the last 72 hours.  CBG: Recent Labs  Lab 05/31/21 1522 05/31/21 2109 06/01/21 0835 06/01/21 1206 06/01/21 1614  GLUCAP 228* 287* 232* 194* 144*   Lipid Profile: No results for input(s): CHOL, HDL, LDLCALC, TRIG, CHOLHDL, LDLDIRECT in the last 72 hours. Thyroid Function Tests: No results for input(s): TSH, T4TOTAL, FREET4, T3FREE, THYROIDAB in the last 72 hours. Anemia Panel: No results for input(s): VITAMINB12, FOLATE, FERRITIN, TIBC, IRON, RETICCTPCT in the last 72 hours. Sepsis Labs: Recent Labs  Lab  05/27/21 1414 05/27/21 1628 05/28/21 0220 05/28/21 0537  LATICACIDVEN 3.7* 2.1* 1.9 1.7    Recent Results (from the past 240 hour(s))  Blood culture (routine x 2)     Status: None   Collection Time: 05/27/21  2:14 PM   Specimen: BLOOD  Result Value Ref Range Status   Specimen Description BLOOD RIGHT ANTECUBITAL  Final   Special Requests   Final    BOTTLES DRAWN AEROBIC ONLY Blood Culture results may not be optimal due to an inadequate volume of blood received in culture bottles   Culture   Final    NO GROWTH 5 DAYS Performed at [facility], [address] [place], [place] [address]    Report Status 06/01/2021 FINAL  Final  Blood culture (routine x 2)     Status: None   Collection Time: 05/27/21  2:14 PM   Specimen: BLOOD  Result Value Ref Range Status   Specimen Description BLOOD BLOOD LEFT FOREARM  Final   Special Requests   Final    BOTTLES DRAWN AEROBIC ONLY Blood Culture results may not be optimal due to an inadequate volume of blood received in culture bottles   Culture   Final    NO GROWTH  5 DAYS Performed at [facility], [address] [place], [place] [address]    Report Status 06/01/2021 FINAL  Final  Resp Panel by RT-PCR (Flu A&B, Covid)     Status: None   Collection Time: 05/27/21  2:53 PM   Specimen: Nasopharyngeal(NP) swabs in vial transport medium  Result Value Ref Range Status   SARS Coronavirus 2 by RT PCR NEGATIVE NEGATIVE Final    Comment: (NOTE) SARS-CoV-2 target nucleic acids are NOT DETECTED.  The SARS-CoV-2 RNA is generally detectable in upper respiratory specimens during the acute phase of infection. The lowest concentration of SARS-CoV-2 viral copies this assay can detect is 138 copies/mL. A negative result does not preclude SARS-Cov-2 infection and should not be used as the sole basis for treatment or other patient management decisions. A negative result may occur with  improper specimen collection/handling, submission of specimen other than nasopharyngeal swab, presence of viral mutation(s) within the areas targeted by this assay, and inadequate number of viral copies(<138 copies/mL). A negative result must be combined with clinical observations, patient history, and epidemiological information. The expected result is Negative.  Fact Sheet for Patients:  EntrepreneurPulse.com.au  Fact Sheet for Healthcare Providers:  IncredibleEmployment.be  This test is no t yet approved or cleared by the [place] FDA and  has been authorized for detection and/or diagnosis of SARS-CoV-2 by FDA under an Emergency Use Authorization (EUA). This EUA will remain  in effect (meaning this test can be used) for the duration of the COVID-19 declaration under Section 564(b)(1) of the Act, 21 U.S.C.section 360bbb-3(b)(1), unless the authorization is terminated  or revoked sooner.       Influenza A by PCR NEGATIVE NEGATIVE Final   Influenza B by PCR NEGATIVE NEGATIVE Final    Comment: (NOTE) The Xpert Xpress  SARS-CoV-2/FLU/RSV plus assay is intended as an aid in the diagnosis of influenza from Nasopharyngeal swab specimens and should not be used as a sole basis for treatment. Nasal washings and aspirates are unacceptable for Xpert Xpress SARS-CoV-2/FLU/RSV testing.  Fact Sheet for Patients: EntrepreneurPulse.com.au  Fact Sheet for Healthcare Providers: IncredibleEmployment.be  This test is not yet approved or cleared by the [place] FDA and has been authorized for detection and/or diagnosis of SARS-CoV-2 by  FDA under an Emergency Use Authorization (EUA). This EUA will remain in effect (meaning this test can be used) for the duration of the COVID-19 declaration under Section 564(b)(1) of the Act, 21 U.S.C. section 360bbb-3(b)(1), unless the authorization is terminated or revoked.  Performed at [facility], [address] [place], [place] [address]   Aerobic/Anaerobic Culture w Gram Stain (surgical/deep wound)     Status: None (Preliminary result)   Collection Time: 05/28/21  4:50 PM   Specimen: Abscess  Result Value Ref Range Status   Specimen Description   Final    ABSCESS Performed at [facility], [address] [place], [place] [address]    Special Requests   Final    ANTERIOR THIGH Performed at [facility], [place], [place] [address]    Gram Stain   Final    NO SQUAMOUS EPITHELIAL CELLS SEEN FEW WBC SEEN ABUNDANT GRAM POSITIVE COCCI Performed at [facility], [place] [address] [place], [place] [address]    Culture   Final    ABUNDANT STAPHYLOCOCCUS AUREUS NO ANAEROBES ISOLATED; CULTURE IN PROGRESS FOR 5 DAYS    Report Status PENDING  Incomplete   Organism ID, Bacteria STAPHYLOCOCCUS AUREUS  Final      Susceptibility   Staphylococcus aureus - MIC*    CIPROFLOXACIN <=0.5 SENSITIVE Sensitive     ERYTHROMYCIN <=0.25 SENSITIVE Sensitive     GENTAMICIN <=0.5 SENSITIVE Sensitive      OXACILLIN 0.5 SENSITIVE Sensitive     TETRACYCLINE <=1 SENSITIVE Sensitive     VANCOMYCIN <=0.5 SENSITIVE Sensitive     TRIMETH/SULFA <=10 SENSITIVE Sensitive     CLINDAMYCIN <=0.25 SENSITIVE Sensitive     RIFAMPIN <=0.5 SENSITIVE Sensitive     Inducible Clindamycin NEGATIVE Sensitive     * ABUNDANT STAPHYLOCOCCUS AUREUS      Radiology Studies: DG Ankle Complete Left  Result Date: 05/31/2021 CLINICAL DATA:  Ankle pain, prior ORIF EXAM: LEFT ANKLE COMPLETE - 3+ VIEW COMPARISON:  05/27/2021 FINDINGS: Status post ORIF of the distal fibula. Fixation screws also traverse into the distal tibial metaphysis. Healing distal fibula fracture visualized by plain radiography. Normal alignment. No subluxation or dislocation. No hardware abnormality. Plantar calcaneal spur noted. IMPRESSION: Status post left distal fibula ORIF. Healing distal fibular fracture. No malalignment or complicating feature by plain radiography. Electronically Signed   By: Jerilynn Mages.  Shick M.D.   On: 05/31/2021 10:51   DG HIP UNILAT WITH PELVIS 2-3 VIEWS LEFT  Result Date: 05/31/2021 CLINICAL DATA:  Left thigh abscess EXAM: DG HIP (WITH OR WITHOUT PELVIS) 2-3V LEFT COMPARISON:  None. FINDINGS: Bilateral hip arthroplasties noted. Bony pelvis intact. IUD device projects over the sacrum in the midline of the pelvis. Left hip views demonstrate anatomic alignment. No subluxation or dislocation. No acute osseous finding or hardware abnormality. Pigtail drains overlie the left proximal thigh. IMPRESSION: Remote bilateral hip arthroplasties. No acute osseous finding or hardware abnormality Proximal left thigh percutaneous drains noted. Electronically Signed   By: Jerilynn Mages.  Shick M.D.   On: 05/31/2021 10:48     Scheduled Meds:  enoxaparin (LOVENOX) injection  40 mg Subcutaneous Q24H   feeding supplement (GLUCERNA SHAKE)  237 mL Oral TID BM   fentaNYL (SUBLIMAZE) injection  50 mcg Intravenous Once   insulin aspart  0-20 Units Subcutaneous TID WC    insulin aspart  7 Units Subcutaneous TID WC   insulin glargine-yfgn  14 Units Subcutaneous QHS   lisinopril  20 mg Oral Daily  metoprolol succinate  50 mg Oral Daily   multivitamin with minerals  1 tablet Oral Daily   sodium chloride flush  5 mL Intracatheter Q8H   sodium chloride flush  5 mL Intracatheter Q8H   Continuous Infusions:   ceFAZolin (ANCEF) IV 2 g (06/01/21 1500)     LOS: 5 days     Enzo Bi, MD [facility] If 7PM-7AM, please contact night-coverage 06/01/2021, 5:03 PM

## 2021-06-02 DIAGNOSIS — L02416 Cutaneous abscess of left lower limb: Secondary | ICD-10-CM | POA: Diagnosis not present

## 2021-06-02 LAB — CBC
HCT: 30.1 % — ABNORMAL LOW (ref 36.0–46.0)
Hemoglobin: 10 g/dL — ABNORMAL LOW (ref 12.0–15.0)
MCH: 26 pg (ref 26.0–34.0)
MCHC: 33.2 g/dL (ref 30.0–36.0)
MCV: 78.2 fL — ABNORMAL LOW (ref 80.0–100.0)
Platelets: 455 10*3/uL — ABNORMAL HIGH (ref 150–400)
RBC: 3.85 MIL/uL — ABNORMAL LOW (ref 3.87–5.11)
RDW: 15.1 % (ref 11.5–15.5)
WBC: 11.8 10*3/uL — ABNORMAL HIGH (ref 4.0–10.5)
nRBC: 0 % (ref 0.0–0.2)

## 2021-06-02 LAB — GLUCOSE, CAPILLARY
Glucose-Capillary: 172 mg/dL — ABNORMAL HIGH (ref 70–99)
Glucose-Capillary: 162 mg/dL — ABNORMAL HIGH (ref 70–99)
Glucose-Capillary: 154 mg/dL — ABNORMAL HIGH (ref 70–99)
Glucose-Capillary: 197 mg/dL — ABNORMAL HIGH (ref 70–99)

## 2021-06-02 LAB — BASIC METABOLIC PANEL
Anion gap: 8 (ref 5–15)
BUN: 6 mg/dL (ref 6–20)
CO2: 24 mmol/L (ref 22–32)
Calcium: 7.9 mg/dL — ABNORMAL LOW (ref 8.9–10.3)
Chloride: 99 mmol/L (ref 98–111)
Creatinine, Ser: 0.49 mg/dL (ref 0.44–1.00)
GFR, Estimated: >60 mL/min (ref >60)
Glucose, Bld: 160 mg/dL — ABNORMAL HIGH (ref 70–99)
Potassium: 3.8 mmol/L (ref 3.5–5.1)
Sodium: 131 mmol/L — ABNORMAL LOW (ref 135–145)

## 2021-06-02 LAB — AEROBIC/ANAEROBIC CULTURE W GRAM STAIN (SURGICAL/DEEP WOUND): Gram Stain: NONE SEEN

## 2021-06-02 LAB — MAGNESIUM: Magnesium: 1.9 mg/dL (ref 1.7–2.4)

## 2021-06-02 NOTE — Progress Notes (Signed)
Met with patient who was anxious to see the attending.Provided presence and support as the patient shared some emotional things for her to recall. She also shared laughter and hopes for her full recovery.

## 2021-06-02 NOTE — Progress Notes (Signed)
[place] RN coordinator Curllee RN called requesting update on patient. Most recent VS provided. Per Curllee, patient remains on the wait list for [facility] and is next in line.

## 2021-06-02 NOTE — Progress Notes (Signed)
PROGRESS NOTE    April Chaney  ZOX:[id number] DOB: 1971-01-26 DOA: 05/27/2021 PCP: Marinda Elk, MD  160A/160A-AA   Assessment & Plan:   Principal Problem:   Abscess of left thigh   April Chaney is a 50 y.o. female with medical history significant for type 2 diabetes mellitus, rheumatoid arthritis, hypertension and GERD, coming to the emergency room with acute onset worsening left thigh and groin pain with associated swelling for the last couple weeks worsening over the last week.  She stated it has become throbbing.  She admitted to occasional fever and chills.  She denies any nausea or vomiting or abdominal pain.  No cough or wheezing or dyspnea.  No chest pain or palpitations.  The patient is status post left total hip arthroplasty in 2011 at [facility].  Venous Doppler came back negative for DVT, but was concerning for large fluid collection in the thigh extending from the left groin to just above the knee.  Left femur CT was recommended and it showed findings suspicious for cellulitis and severe soft tissue infection involving the anterior and medial compartment of the thigh with large rim-enhancing intramuscular fluid collections likely abscesses and soft tissue edema without definite collection in the posterior compartment.  There was additional collection coursing around the greater and lesser trochanter at the proximal femur.  There was focal bony defect within the greater trochanter adjacent to a rim-enhancing collection with mild sclerosis and periostitis suggesting possible osteomyelitis of the greater trochanter and/or postsurgical change.  There was subchondral insufficiency fracture of the medial and lateral femoral condyles of the knee.  CT of the left leg/tib/fib showed superficial soft tissue swelling of the left lower leg likely be seen with cellulitis with  no drainable collection and subacute distal fibula, medial malleolar and posterior malleolar fractures  with old healed proximal fibular diaphyseal fracture.  This was done after an x-ray showed suspected osteomyelitis.    The patient has been in the ER for a while with attempts to be transferred to [facility] and [facility] as well as [facility] without success as initial recommendation was made for IR drainage and IV antibiotics.  Contact was made with Dr. Hampton Abbot who recommended orthopedic evaluation given the fact that this abscess is reaching the hip joint space.   Dr. Leim Fabry was contacted and he agreed with patient's admission here and IR consult and drain placement with IV antibiotics.   # Abscesses of left thigh: w/ extension to left joint space.  # S/p IR guided aspiration of L anterior thigh and lateral thigh abscesses with drain placement on 12/6 # Hx of previous left total hip arthroplasty in 2011.  --continues to spike fever to 100.4 at least once daily.  Will need surgical I/D. --Cx from abscess grew MSSA, abx narroed to cefazolin on 12/9 Plan: --cont cefazolin --pending transfer to [facility]  Severe sepsis:  met criteria w/ tachycardia, tachypnea, leukocytosis, elevated lactic acid and abscess of left thigh.    DM2: poorly uncontrolled.  --A1c 9.2 --cont glargine 14u daily --cont mealtime 7u TID --SSI TID --allow regular diet due to pt not eating well   HTN:  --BP wnl --cont home Lisinopril and Toprol   RA:  --hold home leflunomide due to severe active infection  Hypokalemia --monitor and replete PRN  Poor oral intake --allow regular diet --Ensure supplement   DVT prophylaxis: Lovenox SQ Code Status: Full code  Family Communication:  Level of care: Med-Surg Dispo:  The patient is from: home Anticipated d/c is to: [facility] Anticipated d/c date is: whenever bed available   Subjective and Interval History:  Pt was getting anxious with the long wait for transfer to [facility]  Complained of pain and swelling  in her left thigh.   Objective: Vitals:   06/01/21 2032 06/02/21 0414 06/02/21 1300 06/02/21 1641  BP: 119/79 115/72 128/80 130/75  Pulse: (!) 106 98 100 (!) 104  Resp: 18 14 16 17   Temp: 99.2 F (37.3 C) 98.1 F (36.7 C) 98.9 F (37.2 C) 98.2 F (36.8 C)  TempSrc:      SpO2: 100% 100% 100% 99%  Weight:      Height:        Intake/Output Summary (Last 24 hours) at 06/02/2021 1759 Last data filed at 06/02/2021 1300 Gross per 24 hour  Intake 25 ml  Output 1275 ml  Net -1250 ml   Filed Weights   05/27/21 0820  Weight: 63.5 kg    Examination:   Constitutional: NAD, AAOx3 HEENT: conjunctivae and lids normal, EOMI CV: No cyanosis.   RESP: normal respiratory effort, on RA Extremities: left thigh large than right, 2 drains present with cloudy fluid output SKIN: warm, dry Neuro: II - XII grossly intact.   Psych: labile mood and affect.     Data Reviewed: I have personally reviewed following labs and imaging studies  CBC: Recent Labs  Lab 05/27/21 1210 05/28/21 0537 05/29/21 0306 05/30/21 0324 05/31/21 0245 06/01/21 0544 06/02/21 0513  WBC 18.9*   < > 11.0* 10.3 10.5 9.7 11.8*  NEUTROABS 16.6*  --   --   --   --   --   --   HGB 12.6   < > 10.8* 9.4* 9.6* 10.5* 10.0*  HCT 37.9   < > 32.6* 28.1* 28.6* 31.4* 30.1*  MCV 80.1   < > 80.1 77.2* 78.6* 79.1* 78.2*  PLT 373   < > 287 287 305 382 455*   < > = values in this interval not displayed.   Basic Metabolic Panel: Recent Labs  Lab 05/29/21 0306 05/30/21 0324 05/31/21 0245 06/01/21 0544 06/02/21 0513  NA 134* 132* 133* 133* 131*  K 3.4* 3.7 3.5 3.5 3.8  CL 107 105 103 100 99  CO2 20* 20* 22 26 24   GLUCOSE 127* 302* 171* 261* 160*  BUN 23* 14 9 8 6   CREATININE 0.54 0.40* 0.47 0.45 0.49  CALCIUM 7.9* 7.2* 7.9* 7.8* 7.9*  MG 2.1 2.0 1.9 2.0 1.9   GFR: Estimated Creatinine Clearance: 69.2 mL/min (by C-G formula based on SCr of 0.49 mg/dL). Liver Function Tests: Recent Labs  Lab 05/27/21 1210  05/29/21 0306  AST 17 17  ALT 15 12  ALKPHOS 90 60  BILITOT 0.8 0.9  PROT 7.3 5.9*  ALBUMIN 3.0* 2.2*   No results for input(s): LIPASE, AMYLASE in the last 168 hours. No results for input(s): AMMONIA in the last 168 hours. Coagulation Profile: Recent Labs  Lab 05/27/21 1210  INR 1.5*   Cardiac Enzymes: No results for input(s): CKTOTAL, CKMB, CKMBINDEX, TROPONINI in the last 168 hours. BNP (last 3 results) No results for input(s): PROBNP in the last 8760 hours. HbA1C: No results for input(s): HGBA1C in the last 72 hours.  CBG: Recent Labs  Lab 06/01/21 1614 06/01/21 2132 06/02/21 0909 06/02/21 1303 06/02/21 1639  GLUCAP 144* 173* 172* 162* 154*   Lipid Profile: No results for input(s): CHOL, HDL, LDLCALC, TRIG,  CHOLHDL, LDLDIRECT in the last 72 hours. Thyroid Function Tests: No results for input(s): TSH, T4TOTAL, FREET4, T3FREE, THYROIDAB in the last 72 hours. Anemia Panel: No results for input(s): VITAMINB12, FOLATE, FERRITIN, TIBC, IRON, RETICCTPCT in the last 72 hours. Sepsis Labs: Recent Labs  Lab 05/27/21 1414 05/27/21 1628 05/28/21 0220 05/28/21 0537  LATICACIDVEN 3.7* 2.1* 1.9 1.7    Recent Results (from the past 240 hour(s))  Blood culture (routine x 2)     Status: None   Collection Time: 05/27/21  2:14 PM   Specimen: BLOOD  Result Value Ref Range Status   Specimen Description BLOOD RIGHT ANTECUBITAL  Final   Special Requests   Final    BOTTLES DRAWN AEROBIC ONLY Blood Culture results may not be optimal due to an inadequate volume of blood received in culture bottles   Culture   Final    NO GROWTH 5 DAYS Performed at [facility], [address] [place], [place] [address]    Report Status 06/01/2021 FINAL  Final  Blood culture (routine x 2)     Status: None   Collection Time: 05/27/21  2:14 PM   Specimen: BLOOD  Result Value Ref Range Status   Specimen Description BLOOD BLOOD LEFT FOREARM  Final   Special Requests   Final     BOTTLES DRAWN AEROBIC ONLY Blood Culture results may not be optimal due to an inadequate volume of blood received in culture bottles   Culture   Final    NO GROWTH 5 DAYS Performed at [facility], [address] [place], [place] [address]    Report Status 06/01/2021 FINAL  Final  Resp Panel by RT-PCR (Flu A&B, Covid)     Status: None   Collection Time: 05/27/21  2:53 PM   Specimen: Nasopharyngeal(NP) swabs in vial transport medium  Result Value Ref Range Status   SARS Coronavirus 2 by RT PCR NEGATIVE NEGATIVE Final    Comment: (NOTE) SARS-CoV-2 target nucleic acids are NOT DETECTED.  The SARS-CoV-2 RNA is generally detectable in upper respiratory specimens during the acute phase of infection. The lowest concentration of SARS-CoV-2 viral copies this assay can detect is 138 copies/mL. A negative result does not preclude SARS-Cov-2 infection and should not be used as the sole basis for treatment or other patient management decisions. A negative result may occur with  improper specimen collection/handling, submission of specimen other than nasopharyngeal swab, presence of viral mutation(s) within the areas targeted by this assay, and inadequate number of viral copies(<138 copies/mL). A negative result must be combined with clinical observations, patient history, and epidemiological information. The expected result is Negative.  Fact Sheet for Patients:  EntrepreneurPulse.com.au  Fact Sheet for Healthcare Providers:  IncredibleEmployment.be  This test is no t yet approved or cleared by the [place] FDA and  has been authorized for detection and/or diagnosis of SARS-CoV-2 by FDA under an Emergency Use Authorization (EUA). This EUA will remain  in effect (meaning this test can be used) for the duration of the COVID-19 declaration under Section 564(b)(1) of the Act, 21 U.S.C.section 360bbb-3(b)(1), unless the authorization is  terminated  or revoked sooner.       Influenza A by PCR NEGATIVE NEGATIVE Final   Influenza B by PCR NEGATIVE NEGATIVE Final    Comment: (NOTE) The Xpert Xpress SARS-CoV-2/FLU/RSV plus assay is intended as an aid in the diagnosis of influenza from Nasopharyngeal swab specimens and should not be used as a sole basis for treatment. Nasal washings  and aspirates are unacceptable for Xpert Xpress SARS-CoV-2/FLU/RSV testing.  Fact Sheet for Patients: EntrepreneurPulse.com.au  Fact Sheet for Healthcare Providers: IncredibleEmployment.be  This test is not yet approved or cleared by the [place] FDA and has been authorized for detection and/or diagnosis of SARS-CoV-2 by FDA under an Emergency Use Authorization (EUA). This EUA will remain in effect (meaning this test can be used) for the duration of the COVID-19 declaration under Section 564(b)(1) of the Act, 21 U.S.C. section 360bbb-3(b)(1), unless the authorization is terminated or revoked.  Performed at [facility], [address], [place], [place] [address]   Aerobic/Anaerobic Culture w Gram Stain (surgical/deep wound)     Status: None   Collection Time: 05/28/21  4:50 PM   Specimen: Abscess  Result Value Ref Range Status   Specimen Description   Final    ABSCESS Performed at [facility], [address], [place], [place] [address]    Special Requests   Final    ANTERIOR THIGH Performed at [facility], [address] [place], [place] [address]    Gram Stain   Final    NO SQUAMOUS EPITHELIAL CELLS SEEN FEW WBC SEEN ABUNDANT GRAM POSITIVE COCCI    Culture   Final    ABUNDANT STAPHYLOCOCCUS AUREUS NO ANAEROBES ISOLATED Performed at [facility], [place] [address], [place], [place] [address]    Report Status 06/02/2021 FINAL  Final   Organism ID, Bacteria STAPHYLOCOCCUS AUREUS  Final      Susceptibility   Staphylococcus aureus - MIC*     CIPROFLOXACIN <=0.5 SENSITIVE Sensitive     ERYTHROMYCIN <=0.25 SENSITIVE Sensitive     GENTAMICIN <=0.5 SENSITIVE Sensitive     OXACILLIN 0.5 SENSITIVE Sensitive     TETRACYCLINE <=1 SENSITIVE Sensitive     VANCOMYCIN <=0.5 SENSITIVE Sensitive     TRIMETH/SULFA <=10 SENSITIVE Sensitive     CLINDAMYCIN <=0.25 SENSITIVE Sensitive     RIFAMPIN <=0.5 SENSITIVE Sensitive     Inducible Clindamycin NEGATIVE Sensitive     * ABUNDANT STAPHYLOCOCCUS AUREUS      Radiology Studies: No results found.   Scheduled Meds:  enoxaparin (LOVENOX) injection  40 mg Subcutaneous Q24H   feeding supplement (GLUCERNA SHAKE)  237 mL Oral TID BM   fentaNYL (SUBLIMAZE) injection  50 mcg Intravenous Once   insulin aspart  0-20 Units Subcutaneous TID WC   insulin aspart  7 Units Subcutaneous TID WC   insulin glargine-yfgn  14 Units Subcutaneous QHS   lisinopril  20 mg Oral Daily   metoprolol succinate  50 mg Oral Daily   multivitamin with minerals  1 tablet Oral Daily   sodium chloride flush  5 mL Intracatheter Q8H   sodium chloride flush  5 mL Intracatheter Q8H   Continuous Infusions:   ceFAZolin (ANCEF) IV 2 g (06/02/21 1256)     LOS: 6 days     Enzo Bi, MD [facility] If 7PM-7AM, please contact night-coverage 06/02/2021, 5:59 PM

## 2021-06-02 NOTE — Progress Notes (Signed)
PROGRESS NOTE    April Chaney  EKC:[id number] DOB: 03-Dec-1970 DOA: 05/27/2021 PCP: Marinda Elk, MD  160A/160A-AA   Assessment & Plan:   Principal Problem:   Abscess of left thigh   April Chaney is a 50 y.o. female with medical history significant for type 2 diabetes mellitus, rheumatoid arthritis, hypertension and GERD, coming to the emergency room with acute onset worsening left thigh and groin pain with associated swelling for the last couple weeks worsening over the last week.  She stated it has become throbbing.  She admitted to occasional fever and chills.  She denies any nausea or vomiting or abdominal pain.  No cough or wheezing or dyspnea.  No chest pain or palpitations.  The patient is status post left total hip arthroplasty in 2011 at [facility].  Venous Doppler came back negative for DVT, but was concerning for large fluid collection in the thigh extending from the left groin to just above the knee.  Left femur CT was recommended and it showed findings suspicious for cellulitis and severe soft tissue infection involving the anterior and medial compartment of the thigh with large rim-enhancing intramuscular fluid collections likely abscesses and soft tissue edema without definite collection in the posterior compartment.  There was additional collection coursing around the greater and lesser trochanter at the proximal femur.  There was focal bony defect within the greater trochanter adjacent to a rim-enhancing collection with mild sclerosis and periostitis suggesting possible osteomyelitis of the greater trochanter and/or postsurgical change.  There was subchondral insufficiency fracture of the medial and lateral femoral condyles of the knee.  CT of the left leg/tib/fib showed superficial soft tissue swelling of the left lower leg likely be seen with cellulitis with  no drainable collection and subacute distal fibula, medial malleolar and posterior malleolar fractures  with old healed proximal fibular diaphyseal fracture.  This was done after an x-ray showed suspected osteomyelitis.    The patient has been in the ER for a while with attempts to be transferred to [facility] and [facility] as well as [facility] without success as initial recommendation was made for IR drainage and IV antibiotics.  Contact was made with Dr. Hampton Abbot who recommended orthopedic evaluation given the fact that this abscess is reaching the hip joint space.   Dr. Leim Fabry was contacted and he agreed with patient's admission here and IR consult and drain placement with IV antibiotics.   # Abscesses of left thigh: w/ extension to left joint space.  # S/p IR guided aspiration of L anterior thigh and lateral thigh abscesses with drain placement on 12/6 # Hx of previous left total hip arthroplasty in 2011.  --continues to spike fever to 100.4 at least once daily.  Will need surgical I/D. --Cx from abscess grew MSSA, abx narroed to cefazolin on 12/9 Plan: --cont cefazolin --pending transfer to [facility]  Severe sepsis:  met criteria w/ tachycardia, tachypnea, leukocytosis, elevated lactic acid and abscess of left thigh.    DM2: poorly uncontrolled.  --A1c 9.2 --cont glargine 14u daily --add mealtime 7u TID --SSI TID --allow regular diet due to pt not eating well   HTN:  --BP wnl --cont home Lisinopril and Toprol   RA:  --hold home leflunomide due to severe active infection  Hypokalemia --monitor and replete PRN  Poor oral intake --allow regular diet --Ensure supplement   DVT prophylaxis: Lovenox SQ Code Status: Full code  Family Communication:  Level of care: Med-Surg Dispo:  The patient is from: home Anticipated d/c is to: [facility] Anticipated d/c date is: whenever bed available   Subjective and Interval History:  No more fevers.  Pt was getting more anxious waiting on the transfer to [facility].      Objective: Vitals:   06/01/21 2032 06/02/21 0414 06/02/21 1300 06/02/21 1641  BP: 119/79 115/72 128/80 130/75  Pulse: (!) 106 98 100 (!) 104  Resp: _0 Temp: 99.2 F (37.3 C) 98.1 F (36.7 C) 98.9 F (37.2 C) 98.2 F (36.8 C)  TempSrc:      SpO2: 100% 100% 100% 99%  Weight:      Height:        Intake/Output Summary (Last 24 hours) at 06/02/2021 1752 Last data filed at 06/02/2021 1300 Gross per 24 hour  Intake 25 ml  Output 1275 ml  Net -1250 ml   Filed Weights   05/27/21 0820  Weight: 63.5 kg    Examination:   Constitutional: NAD, AAOx3 HEENT: conjunctivae and lids normal, EOMI CV: No cyanosis.   RESP: normal respiratory effort, on RA Extremities: 2 drains from left thigh SKIN: warm, dry Neuro: II - XII grossly intact.   Psych: anxious mood and affect.     Data Reviewed: I have personally reviewed following labs and imaging studies  CBC: Recent Labs  Lab 05/27/21 1210 05/28/21 0537 05/29/21 0306 05/30/21 0324 05/31/21 0245 06/01/21 0544 06/02/21 0513  WBC 18.9*   < > 11.0* 10.3 10.5 9.7 11.8*  NEUTROABS 16.6*  --   --   --   --   --   --   HGB 12.6   < > 10.8* 9.4* 9.6* 10.5* 10.0*  HCT 37.9   < > 32.6* 28.1* 28.6* 31.4* 30.1*  MCV 80.1   < > 80.1 77.2* 78.6* 79.1* 78.2*  PLT 373   < > 287 287 305 382 455*   < > = values in this interval not displayed.   Basic Metabolic Panel: Recent Labs  Lab 05/29/21 0306 05/30/21 0324 05/31/21 0245 06/01/21 0544 06/02/21 0513  NA 134* 132* 133* 133* 131*  K 3.4* 3.7 3.5 3.5 3.8  CL 107 105 103 100 99  CO2 20* 20* _1 GLUCOSE 127* 302* 171* 261* 160*  BUN 23* _2 CREATININE 0.54 0.40* 0.47 0.45 0.49  CALCIUM 7.9* 7.2* 7.9* 7.8* 7.9*  MG 2.1 2.0 1.9 2.0 1.9   GFR: Estimated Creatinine Clearance: 69.2 mL/min (by C-G formula based on SCr of 0.49 mg/dL). Liver Function Tests: Recent Labs  Lab 05/27/21 1210 05/29/21 0306  AST 17 17  ALT 15 12  ALKPHOS 90 60  BILITOT 0.8  0.9  PROT 7.3 5.9*  ALBUMIN 3.0* 2.2*   No results for input(s): LIPASE, AMYLASE in the last 168 hours. No results for input(s): AMMONIA in the last 168 hours. Coagulation Profile: Recent Labs  Lab 05/27/21 1210  INR 1.5*   Cardiac Enzymes: No results for input(s): CKTOTAL, CKMB, CKMBINDEX, TROPONINI in the last 168 hours. BNP (last 3 results) No results for input(s): PROBNP in the last 8760 hours. HbA1C: No results for input(s): HGBA1C in the last 72 hours.  CBG: Recent Labs  Lab 06/01/21 1614 06/01/21 2132 06/02/21 0909 06/02/21 1303 06/02/21 1639  GLUCAP 144* 173* 172* 162* 154*   Lipid Profile: No results for input(s): CHOL, HDL, LDLCALC, TRIG, CHOLHDL, LDLDIRECT in the last 72 hours. Thyroid Function Tests: No results  for input(s): TSH, T4TOTAL, FREET4, T3FREE, THYROIDAB in the last 72 hours. Anemia Panel: No results for input(s): VITAMINB12, FOLATE, FERRITIN, TIBC, IRON, RETICCTPCT in the last 72 hours. Sepsis Labs: Recent Labs  Lab 05/27/21 1414 05/27/21 1628 05/28/21 0220 05/28/21 0537  LATICACIDVEN 3.7* 2.1* 1.9 1.7    Recent Results (from the past 240 hour(s))  Blood culture (routine x 2)     Status: None   Collection Time: 05/27/21  2:14 PM   Specimen: BLOOD  Result Value Ref Range Status   Specimen Description BLOOD RIGHT ANTECUBITAL  Final   Special Requests   Final    BOTTLES DRAWN AEROBIC ONLY Blood Culture results may not be optimal due to an inadequate volume of blood received in culture bottles   Culture   Final    NO GROWTH 5 DAYS Performed at [facility], [address] [place], [place] [address]    Report Status 06/01/2021 FINAL  Final  Blood culture (routine x 2)     Status: None   Collection Time: 05/27/21  2:14 PM   Specimen: BLOOD  Result Value Ref Range Status   Specimen Description BLOOD BLOOD LEFT FOREARM  Final   Special Requests   Final    BOTTLES DRAWN AEROBIC ONLY Blood Culture results may not be optimal due  to an inadequate volume of blood received in culture bottles   Culture   Final    NO GROWTH 5 DAYS Performed at [facility], [address] [place], [place] [address]    Report Status 06/01/2021 FINAL  Final  Resp Panel by RT-PCR (Flu A&B, Covid)     Status: None   Collection Time: 05/27/21  2:53 PM   Specimen: Nasopharyngeal(NP) swabs in vial transport medium  Result Value Ref Range Status   SARS Coronavirus 2 by RT PCR NEGATIVE NEGATIVE Final    Comment: (NOTE) SARS-CoV-2 target nucleic acids are NOT DETECTED.  The SARS-CoV-2 RNA is generally detectable in upper respiratory specimens during the acute phase of infection. The lowest concentration of SARS-CoV-2 viral copies this assay can detect is 138 copies/mL. A negative result does not preclude SARS-Cov-2 infection and should not be used as the sole basis for treatment or other patient management decisions. A negative result may occur with  improper specimen collection/handling, submission of specimen other than nasopharyngeal swab, presence of viral mutation(s) within the areas targeted by this assay, and inadequate number of viral copies(<138 copies/mL). A negative result must be combined with clinical observations, patient history, and epidemiological information. The expected result is Negative.  Fact Sheet for Patients:  EntrepreneurPulse.com.au  Fact Sheet for Healthcare Providers:  IncredibleEmployment.be  This test is no t yet approved or cleared by the [place] FDA and  has been authorized for detection and/or diagnosis of SARS-CoV-2 by FDA under an Emergency Use Authorization (EUA). This EUA will remain  in effect (meaning this test can be used) for the duration of the COVID-19 declaration under Section 564(b)(1) of the Act, 21 U.S.C.section 360bbb-3(b)(1), unless the authorization is terminated  or revoked sooner.       Influenza A by PCR NEGATIVE NEGATIVE  Final   Influenza B by PCR NEGATIVE NEGATIVE Final    Comment: (NOTE) The Xpert Xpress SARS-CoV-2/FLU/RSV plus assay is intended as an aid in the diagnosis of influenza from Nasopharyngeal swab specimens and should not be used as a sole basis for treatment. Nasal washings and aspirates are unacceptable for Xpert Xpress SARS-CoV-2/FLU/RSV testing.  Fact Sheet  for Patients: EntrepreneurPulse.com.au  Fact Sheet for Healthcare Providers: IncredibleEmployment.be  This test is not yet approved or cleared by the [place] FDA and has been authorized for detection and/or diagnosis of SARS-CoV-2 by FDA under an Emergency Use Authorization (EUA). This EUA will remain in effect (meaning this test can be used) for the duration of the COVID-19 declaration under Section 564(b)(1) of the Act, 21 U.S.C. section 360bbb-3(b)(1), unless the authorization is terminated or revoked.  Performed at [facility], [address] [place], [place] [address]   Aerobic/Anaerobic Culture w Gram Stain (surgical/deep wound)     Status: None   Collection Time: 05/28/21  4:50 PM   Specimen: Abscess  Result Value Ref Range Status   Specimen Description   Final    ABSCESS Performed at [facility], [address], [place], [place] [address]    Special Requests   Final    ANTERIOR THIGH Performed at [facility], [address] [place], [place] [address]    Gram Stain   Final    NO SQUAMOUS EPITHELIAL CELLS SEEN FEW WBC SEEN ABUNDANT GRAM POSITIVE COCCI    Culture   Final    ABUNDANT STAPHYLOCOCCUS AUREUS NO ANAEROBES ISOLATED Performed at [facility], [place] [address], [place], [place] [address]    Report Status 06/02/2021 FINAL  Final   Organism ID, Bacteria STAPHYLOCOCCUS AUREUS  Final      Susceptibility   Staphylococcus aureus - MIC*    CIPROFLOXACIN <=0.5 SENSITIVE Sensitive     ERYTHROMYCIN <=0.25 SENSITIVE Sensitive      GENTAMICIN <=0.5 SENSITIVE Sensitive     OXACILLIN 0.5 SENSITIVE Sensitive     TETRACYCLINE <=1 SENSITIVE Sensitive     VANCOMYCIN <=0.5 SENSITIVE Sensitive     TRIMETH/SULFA <=10 SENSITIVE Sensitive     CLINDAMYCIN <=0.25 SENSITIVE Sensitive     RIFAMPIN <=0.5 SENSITIVE Sensitive     Inducible Clindamycin NEGATIVE Sensitive     * ABUNDANT STAPHYLOCOCCUS AUREUS      Radiology Studies: No results found.   Scheduled Meds:  enoxaparin (LOVENOX) injection  40 mg Subcutaneous Q24H   feeding supplement (GLUCERNA SHAKE)  237 mL Oral TID BM   fentaNYL (SUBLIMAZE) injection  50 mcg Intravenous Once   insulin aspart  0-20 Units Subcutaneous TID WC   insulin aspart  7 Units Subcutaneous TID WC   insulin glargine-yfgn  14 Units Subcutaneous QHS   lisinopril  20 mg Oral Daily   metoprolol succinate  50 mg Oral Daily   multivitamin with minerals  1 tablet Oral Daily   sodium chloride flush  5 mL Intracatheter Q8H   sodium chloride flush  5 mL Intracatheter Q8H   Continuous Infusions:   ceFAZolin (ANCEF) IV 2 g (06/02/21 1256)     LOS: 6 days     Enzo Bi, MD [facility] If 7PM-7AM, please contact night-coverage 06/02/2021, 5:52 PM

## 2021-06-03 LAB — CBC
HCT: 30.4 % — ABNORMAL LOW (ref 36.0–46.0)
Hemoglobin: 9.9 g/dL — ABNORMAL LOW (ref 12.0–15.0)
MCH: 26.1 pg (ref 26.0–34.0)
MCHC: 32.6 g/dL (ref 30.0–36.0)
MCV: 80.2 fL (ref 80.0–100.0)
Platelets: 523 10*3/uL — ABNORMAL HIGH (ref 150–400)
RBC: 3.79 MIL/uL — ABNORMAL LOW (ref 3.87–5.11)
RDW: 15.5 % (ref 11.5–15.5)
WBC: 11.5 10*3/uL — ABNORMAL HIGH (ref 4.0–10.5)
nRBC: 0 % (ref 0.0–0.2)

## 2021-06-03 LAB — BASIC METABOLIC PANEL
Anion gap: 7 (ref 5–15)
BUN: 8 mg/dL (ref 6–20)
CO2: 26 mmol/L (ref 22–32)
Calcium: 8.1 mg/dL — ABNORMAL LOW (ref 8.9–10.3)
Chloride: 99 mmol/L (ref 98–111)
Creatinine, Ser: 0.45 mg/dL (ref 0.44–1.00)
GFR, Estimated: >60 mL/min (ref >60)
Glucose, Bld: 205 mg/dL — ABNORMAL HIGH (ref 70–99)
Potassium: 4 mmol/L (ref 3.5–5.1)
Sodium: 132 mmol/L — ABNORMAL LOW (ref 135–145)

## 2021-06-03 LAB — GLUCOSE, CAPILLARY
Glucose-Capillary: 249 mg/dL — ABNORMAL HIGH (ref 70–99)
Glucose-Capillary: 149 mg/dL — ABNORMAL HIGH (ref 70–99)
Glucose-Capillary: 156 mg/dL — ABNORMAL HIGH (ref 70–99)
Glucose-Capillary: 167 mg/dL — ABNORMAL HIGH (ref 70–99)
Glucose-Capillary: 208 mg/dL — ABNORMAL HIGH (ref 70–99)

## 2021-06-03 LAB — MAGNESIUM: Magnesium: 2.3 mg/dL (ref 1.7–2.4)

## 2021-06-03 MED ORDER — OXYCODONE HCL 5 MG PO TABS
5.0000 mg | ORAL_TABLET | ORAL | Status: DC | PRN
  Administered 2021-06-04 – 2021-06-05 (×6): 5 mg via ORAL
  Filled 2021-06-03 (×6): qty 1

## 2021-06-03 MED ORDER — RIFAMPIN 300 MG PO CAPS
300.0000 mg | ORAL_CAPSULE | Freq: Two times a day (BID) | ORAL | Status: DC
  Administered 2021-06-03 – 2021-06-05 (×6): 300 mg via ORAL
  Filled 2021-06-03 (×7): qty 1

## 2021-06-03 NOTE — Progress Notes (Signed)
   Date of Admission:  05/27/2021     ID: April Chaney is a 50 y.o. female  Principal Problem:   Abscess of left thigh    Subjective: Still waiting for transfer No fever Left thigh swelling better   enoxaparin (LOVENOX) injection  40 mg Subcutaneous Q24H   feeding supplement (GLUCERNA SHAKE)  237 mL Oral TID BM   fentaNYL (SUBLIMAZE) injection  50 mcg Intravenous Once   insulin aspart  0-20 Units Subcutaneous TID WC   insulin aspart  7 Units Subcutaneous TID WC   insulin glargine-yfgn  14 Units Subcutaneous QHS   lisinopril  20 mg Oral Daily   metoprolol succinate  50 mg Oral Daily   multivitamin with minerals  1 tablet Oral Daily   sodium chloride flush  5 mL Intracatheter Q8H   sodium chloride flush  5 mL Intracatheter Q8H    Objective: Patient Vitals for the past 24 hrs:  BP Temp Pulse Resp SpO2  06/03/21 1128 135/83 99.4 F (37.4 C) 88 16 100 %  06/03/21 0724 124/82 98.7 F (37.1 C) 89 15 98 %  06/03/21 0408 129/84 98.5 F (36.9 C) 90 16 99 %  06/02/21 2024 110/66 99.6 F (37.6 C) (!) 105 14 98 %  06/02/21 1641 130/75 98.2 F (36.8 C) (!) 104 17 99 %  06/02/21 1300 128/80 98.9 F (37.2 C) 100 16 100 %    PHYSICAL EXAM:  General: Alert, cooperative, no distress, tearful Lungs: Clear to auscultation bilaterally. No Wheezing or Rhonchi. No rales. Heart: Regular rate and rhythm, no murmur, rub or gallop. Abdomen: Soft, non-tender,not distended. Bowel sounds normal. No masses Extremities:swollen left thigh Drains present Skin: No rashes or lesions. Or bruising Lymph: Cervical, supraclavicular normal. Neurologic: Grossly non-focal  Lab Results Recent Labs    06/02/21 0513 06/03/21 0253  WBC 11.8* 11.5*  HGB 10.0* 9.9*  HCT 30.1* 30.4*  NA 131* 132*  K 3.8 4.0  CL 99 99  CO2 24 26  BUN 6 8  CREATININE 0.49 0.45   Microbiology: Abscess culture- staph aureus-[facility] 05/27/21 NG    Assessment/Plan: Encephalopathy/confusion--resolved  Left  thigh abscesses extensive, deep involving the anterior and medial compartment. IR placed drain.  ortho seen the patient   Needs extensive surgery and has to be transferred to an academic center. 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  Used to be on IL-6 inhibitor   Diabetes mellitus poorly controlled  Hypertension on lisinopril and metoprolol  Past history of skin infections.  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

## 2021-06-04 ENCOUNTER — Inpatient Hospital Stay: Payer: BC Managed Care – PPO

## 2021-06-04 LAB — CBC
HCT: 29.4 % — ABNORMAL LOW (ref 36.0–46.0)
Hemoglobin: 9.6 g/dL — ABNORMAL LOW (ref 12.0–15.0)
MCH: 25.7 pg — ABNORMAL LOW (ref 26.0–34.0)
MCHC: 32.7 g/dL (ref 30.0–36.0)
MCV: 78.6 fL — ABNORMAL LOW (ref 80.0–100.0)
Platelets: 609 10*3/uL — ABNORMAL HIGH (ref 150–400)
RBC: 3.74 MIL/uL — ABNORMAL LOW (ref 3.87–5.11)
RDW: 15.1 % (ref 11.5–15.5)
WBC: 13.7 10*3/uL — ABNORMAL HIGH (ref 4.0–10.5)
nRBC: 0 % (ref 0.0–0.2)

## 2021-06-04 LAB — MAGNESIUM: Magnesium: 1.9 mg/dL (ref 1.7–2.4)

## 2021-06-04 LAB — BASIC METABOLIC PANEL
Anion gap: 10 (ref 5–15)
BUN: 9 mg/dL (ref 6–20)
CO2: 24 mmol/L (ref 22–32)
Calcium: 8.1 mg/dL — ABNORMAL LOW (ref 8.9–10.3)
Chloride: 97 mmol/L — ABNORMAL LOW (ref 98–111)
Creatinine, Ser: 0.54 mg/dL (ref 0.44–1.00)
GFR, Estimated: >60 mL/min (ref >60)
Glucose, Bld: 217 mg/dL — ABNORMAL HIGH (ref 70–99)
Potassium: 3.9 mmol/L (ref 3.5–5.1)
Sodium: 131 mmol/L — ABNORMAL LOW (ref 135–145)

## 2021-06-04 LAB — GLUCOSE, CAPILLARY
Glucose-Capillary: 265 mg/dL — ABNORMAL HIGH (ref 70–99)
Glucose-Capillary: 142 mg/dL — ABNORMAL HIGH (ref 70–99)
Glucose-Capillary: 152 mg/dL — ABNORMAL HIGH (ref 70–99)
Glucose-Capillary: 163 mg/dL — ABNORMAL HIGH (ref 70–99)
Glucose-Capillary: 177 mg/dL — ABNORMAL HIGH (ref 70–99)

## 2021-06-04 MED ORDER — IOHEXOL 300 MG/ML  SOLN
100.0000 mL | Freq: Once | INTRAMUSCULAR | Status: AC | PRN
  Administered 2021-06-04: 100 mL via INTRAVENOUS

## 2021-06-04 MED ORDER — OXYCODONE HCL 5 MG PO TABS: 5.0000 mg | ORAL_TABLET | ORAL | 0 refills | Status: DC | PRN

## 2021-06-04 MED ORDER — INSULIN GLARGINE-YFGN 100 UNIT/ML ~~LOC~~ SOLN
16.0000 [IU] | Freq: Every day | SUBCUTANEOUS | Status: DC
  Administered 2021-06-04 – 2021-06-05 (×2): 16 [IU] via SUBCUTANEOUS
  Filled 2021-06-04 (×2): qty 0.16

## 2021-06-04 MED ORDER — RIFAMPIN 300 MG PO CAPS: 300.0000 mg | ORAL_CAPSULE | Freq: Two times a day (BID) | ORAL | Status: DC

## 2021-06-04 MED ORDER — SODIUM CHLORIDE 0.9 % IV SOLN: INTRAVENOUS | Status: DC | PRN

## 2021-06-04 NOTE — Evaluation (Signed)
Physical Therapy Evaluation Patient Details Name: April Chaney MRN: [id number] DOB: [birth year] Today's Date: 06/04/2021  History of Present Illness  Pt admitted for L thigh abscess with intense pain. Pt is s/p aspiration with drains placed on 05/28/21. History includes DM, RA, HTN, and GERD. Per chart review, would benefit from transfer to tertiary care, however no bed available.  Clinical Impression  Pt is a pleasant 50 year old female who was admitted for L thigh abscess with aspiration noted. Pt performs bed mobility with min assist and transfers with cga and RW. Unable to ambulate at this time. Pt demonstrates deficits with pain/mobility. Needs heavy encouragement to participate as she is concerned and fearful of movement. Educated to continue to mobilize with nursing. Would benefit from skilled PT to address above deficits and promote optimal return to PLOF. Currently recommending OP PT.     Recommendations for follow up therapy are one component of a multi-disciplinary discharge planning process, led by the attending physician.  Recommendations may be updated based on patient status, additional functional criteria and insurance authorization.  Follow Up Recommendations Outpatient PT    Assistance Recommended at Discharge Intermittent Supervision/Assistance  Functional Status Assessment Patient has had a recent decline in their functional status and demonstrates the ability to make significant improvements in function in a reasonable and predictable amount of time.  Equipment Recommendations  Rolling walker (2 wheels)    Recommendations for Other Services       Precautions / Restrictions Restrictions Weight Bearing Restrictions: No      Mobility  Bed Mobility Overal bed mobility: Needs Assistance Bed Mobility: Supine to Sit     Supine to sit: Min assist     General bed mobility comments: cues for sequencing including propping up on elbows and scooting towards EOB.  Educated on ways she could assist staff in moving her to make her more functionally indep. Once seated, guarding noted with R lateral lean    Transfers Overall transfer level: Needs assistance Equipment used: Rolling walker (2 wheels) Transfers: Bed to chair/wheelchair/BSC     Step pivot transfers: Min guard       General transfer comment: able to practice several reps of standing with cues for hand placement and use of off loading L LE for pain control. Good static balance while standing.Heavy guarding present during standing.    Ambulation/Gait               General Gait Details: self limiting and hesitant to ambulate this date. Focused more on transfers this date to improve functional independence  Financial trader Rank[employer] (Stroke Patients Only)       Balance Overall balance assessment: Needs assistance Sitting-balance support: Feet supported Sitting balance-Leahy Scale: Normal     Standing balance support: Bilateral upper extremity supported Standing balance-Leahy Scale: Fair                               Pertinent Vitals/Pain Pain Assessment: 0-10 Pain Score: 8  Pain Location: L LE Pain Descriptors / Indicators: Aching;Constant;Discomfort;Grimacing;Guarding Pain Intervention(s): Limited activity within patient's tolerance;Premedicated before session;Repositioned;Patient requesting pain meds-RN notified    Home Living Family/patient expects to be discharged to:: Private residence Living Arrangements: Alone   Type of Home: House Home Access: Stairs to enter Entrance Stairs-Rails: None Entrance Stairs-Number of Steps: 1   Home Layout: One level Home  Equipment: Kasandra Knudsen - single point      Prior Function Prior Level of Function : Working/employed;Independent/Modified Independent;Driving             Mobility Comments: reports she was able to ambulate with SPC until 1 week ago when her pain  intensified. ADLs Comments: was working and driving     Journalist, newspaper        Extremity/Trunk Assessment   Upper Extremity Assessment Upper Extremity Assessment: Overall WFL for tasks assessed    Lower Extremity Assessment Lower Extremity Assessment: Generalized weakness (L LE grossly 3/5; R LE grossly 5/5)       Communication   Communication: No difficulties  Cognition Arousal/Alertness: Awake/alert Behavior During Therapy: Agitated Overall Cognitive Status: Within Functional Limits for tasks assessed                                 General Comments: takes lots of encouragement to work with pt. very self limiting        General Comments      Exercises     Assessment/Plan    PT Assessment Patient needs continued PT services  PT Problem List Decreased activity tolerance;Decreased mobility;Decreased knowledge of use of DME;Pain       PT Treatment Interventions DME instruction;Gait training;Therapeutic exercise    PT Goals (Current goals can be found in the Care Plan section)  Acute Rehab PT Goals Patient Stated Goal: to go home PT Goal Formulation: With patient Time For Goal Achievement: 06/18/21 Potential to Achieve Goals: Good    Frequency Min 2X/week   Barriers to discharge        Co-evaluation               AM-PAC PT "6 Clicks" Mobility  Outcome Measure Help needed turning from your back to your side while in a flat bed without using bedrails?: A Little Help needed moving from lying on your back to sitting on the side of a flat bed without using bedrails?: A Little Help needed moving to and from a bed to a chair (including a wheelchair)?: A Little Help needed standing up from a chair using your arms (e.g., wheelchair or bedside chair)?: A Little Help needed to walk in hospital room?: A Little Help needed climbing 3-5 steps with a railing? : A Little 6 Click Score: 18    End of Session Equipment Utilized During Treatment:  Gait belt Activity Tolerance: Patient tolerated treatment well Patient left: in chair;with chair alarm set Nurse Communication: Mobility status PT Visit Diagnosis: Pain;Difficulty in walking, not elsewhere classified (R26.2) Pain - Right/Left: Left Pain - part of body: Leg    Time: 1420-1454 PT Time Calculation (min) (ACUTE ONLY): 34 min   Charges:   PT Evaluation $PT Eval Low Complexity: 1 Low PT Treatments $Therapeutic Activity: 8-22 mins        Greggory Stallion, PT, DPT [phone]   Josephene Marrone 06/04/2021, 4:59 PM

## 2021-06-04 NOTE — Plan of Care (Signed)

## 2021-06-04 NOTE — Progress Notes (Signed)
PROGRESS NOTE    April Chaney  GHW:[id number] DOB: 06-17-1971 DOA: 05/27/2021 PCP: Marinda Elk, MD  160A/160A-AA   Assessment & Plan:   Principal Problem:   Abscess of left thigh   April Chaney is a 50 y.o. female with medical history significant for type 2 diabetes mellitus, rheumatoid arthritis, hypertension and GERD, coming to the emergency room with acute onset worsening left thigh and groin pain with associated swelling for the last couple weeks worsening over the last week.  She stated it has become throbbing.  She admitted to occasional fever and chills.  She denies any nausea or vomiting or abdominal pain.  No cough or wheezing or dyspnea.  No chest pain or palpitations.  The patient is status post left total hip arthroplasty in 2011 at [facility].  Venous Doppler came back negative for DVT, but was concerning for large fluid collection in the thigh extending from the left groin to just above the knee.  Left femur CT was recommended and it showed findings suspicious for cellulitis and severe soft tissue infection involving the anterior and medial compartment of the thigh with large rim-enhancing intramuscular fluid collections likely abscesses and soft tissue edema without definite collection in the posterior compartment.  There was additional collection coursing around the greater and lesser trochanter at the proximal femur.  There was focal bony defect within the greater trochanter adjacent to a rim-enhancing collection with mild sclerosis and periostitis suggesting possible osteomyelitis of the greater trochanter and/or postsurgical change.  There was subchondral insufficiency fracture of the medial and lateral femoral condyles of the knee.  CT of the left leg/tib/fib showed superficial soft tissue swelling of the left lower leg likely be seen with cellulitis with  no drainable collection and subacute distal fibula, medial malleolar and posterior malleolar fractures  with old healed proximal fibular diaphyseal fracture.  This was done after an x-ray showed suspected osteomyelitis.    The patient has been in the ER for a while with attempts to be transferred to [facility] as well as [facility] without success as initial recommendation was made for IR drainage and IV antibiotics.  Contact was made with Dr. Hampton Abbot who recommended orthopedic evaluation given the fact that this abscess is reaching the hip joint space.   Dr. Leim Fabry was contacted and he agreed with patient's admission here and IR consult and drain placement with IV antibiotics.   # Abscesses of left thigh: w/ extension to left joint space.  # S/p IR guided aspiration of L anterior thigh and lateral thigh abscesses with drain placement on 12/6 # Hx of previous left total hip arthroplasty in 2011.  --started on vanc/zosyn on presentation.  Cx from abscess grew MSSA, abx narroed to cefazolin on 12/9.  Rifampin added on 12/12 by ID Plan: --cont cefazolin and Rifampin --repeat CT femur left w contrast  --pending transfer to [facility] for definitive surgical management   Severe sepsis:  met criteria w/ tachycardia, tachypnea, leukocytosis, elevated lactic acid and abscess of left thigh.    DM2: poorly uncontrolled.  --A1c 9.2 --increase glargine to 16u nightly --cont mealtime 7u TID --SSI TID --allow regular diet due to pt not eating well   HTN:  --BP wnl --cont home Lisinopril and Toprol   RA:  --hold home leflunomide due to severe active infection  Hypokalemia --monitor and replete PRN  Poor oral intake --allow regular diet --Ensure supplement  Encephalopathy has been ruled out  DKA ruled out  Left buttock ulcer, ruled out   DVT prophylaxis: Lovenox SQ Code Status: Full code  Family Communication:  Level of care: Med-Surg Dispo:   The patient is from: home Anticipated d/c is to: [facility] Anticipated d/c date  is: whenever bed available   Subjective and Interval History:  Pt has been getting very frustrated with medical staff about her delay with transfer to [facility]  PT worked with pt today.   Objective: Vitals:   06/03/21 2012 06/04/21 0555 06/04/21 0758 06/04/21 1547  BP: (!) 143/82 119/77 129/85 121/81  Pulse: (!) 101 96 97 100  Resp: 20 20 16 16   Temp: 98 F (36.7 C) 98.3 F (36.8 C) 98.4 F (36.9 C) 99.1 F (37.3 C)  TempSrc:      SpO2: 97% 100% 100% 100%  Weight:      Height:        Intake/Output Summary (Last 24 hours) at 06/04/2021 1814 Last data filed at 06/04/2021 1700 Gross per 24 hour  Intake 587.45 ml  Output 1860 ml  Net -1272.55 ml   Filed Weights   05/27/21 0820  Weight: 63.5 kg    Examination:   Constitutional: NAD, AAOx3 HEENT: conjunctivae and lids normal, EOMI CV: No cyanosis.   RESP: normal respiratory effort, on RA Neuro: II - XII grossly intact.   Psych: annoyed mood and affect.     Data Reviewed: I have personally reviewed following labs and imaging studies  CBC: Recent Labs  Lab 05/31/21 0245 06/01/21 0544 06/02/21 0513 06/03/21 0253 06/04/21 0301  WBC 10.5 9.7 11.8* 11.5* 13.7*  HGB 9.6* 10.5* 10.0* 9.9* 9.6*  HCT 28.6* 31.4* 30.1* 30.4* 29.4*  MCV 78.6* 79.1* 78.2* 80.2 78.6*  PLT 305 382 455* 523* 606*   Basic Metabolic Panel: Recent Labs  Lab 05/31/21 0245 06/01/21 0544 06/02/21 0513 06/03/21 0253 06/04/21 0301  NA 133* 133* 131* 132* 131*  K 3.5 3.5 3.8 4.0 3.9  CL 103 100 99 99 97*  CO2 22 26 24 26 24   GLUCOSE 171* 261* 160* 205* 217*  BUN 9 8 6 8 9   CREATININE 0.47 0.45 0.49 0.45 0.54  CALCIUM 7.9* 7.8* 7.9* 8.1* 8.1*  MG 1.9 2.0 1.9 2.3 1.9   GFR: Estimated Creatinine Clearance: 69.2 mL/min (by C-G formula based on SCr of 0.54 mg/dL). Liver Function Tests: Recent Labs  Lab 05/29/21 0306  AST 17  ALT 12  ALKPHOS 60  BILITOT 0.9  PROT 5.9*  ALBUMIN 2.2*   No results for input(s): LIPASE, AMYLASE  in the last 168 hours. No results for input(s): AMMONIA in the last 168 hours. Coagulation Profile: No results for input(s): INR, PROTIME in the last 168 hours.  Cardiac Enzymes: No results for input(s): CKTOTAL, CKMB, CKMBINDEX, TROPONINI in the last 168 hours. BNP (last 3 results) No results for input(s): PROBNP in the last 8760 hours. HbA1C: No results for input(s): HGBA1C in the last 72 hours.  CBG: Recent Labs  Lab 06/03/21 2031 06/03/21 2209 06/04/21 0831 06/04/21 1205 06/04/21 1543  GLUCAP 167* 208* 265* 142* 152*   Lipid Profile: No results for input(s): CHOL, HDL, LDLCALC, TRIG, CHOLHDL, LDLDIRECT in the last 72 hours. Thyroid Function Tests: No results for input(s): TSH, T4TOTAL, FREET4, T3FREE, THYROIDAB in the last 72 hours. Anemia Panel: No results for input(s): VITAMINB12, FOLATE, FERRITIN, TIBC, IRON, RETICCTPCT in the last 72 hours. Sepsis Labs: No results for input(s): PROCALCITON, LATICACIDVEN in the last 168 hours.  Recent Results (from the past 240 hour(s))  Blood culture (routine x 2)     Status: None   Collection Time: 05/27/21  2:14 PM   Specimen: BLOOD  Result Value Ref Range Status   Specimen Description BLOOD RIGHT ANTECUBITAL  Final   Special Requests   Final    BOTTLES DRAWN AEROBIC ONLY Blood Culture results may not be optimal due to an inadequate volume of blood received in culture bottles   Culture   Final    NO GROWTH 5 DAYS Performed at [facility], [address] [place], [place] [address]    Report Status 06/01/2021 FINAL  Final  Blood culture (routine x 2)     Status: None   Collection Time: 05/27/21  2:14 PM   Specimen: BLOOD  Result Value Ref Range Status   Specimen Description BLOOD BLOOD LEFT FOREARM  Final   Special Requests   Final    BOTTLES DRAWN AEROBIC ONLY Blood Culture results may not be optimal due to an inadequate volume of blood received in culture bottles   Culture   Final    NO GROWTH 5  DAYS Performed at [facility], [address] [place], [place] [address]    Report Status 06/01/2021 FINAL  Final  Resp Panel by RT-PCR (Flu A&B, Covid)     Status: None   Collection Time: 05/27/21  2:53 PM   Specimen: Nasopharyngeal(NP) swabs in vial transport medium  Result Value Ref Range Status   SARS Coronavirus 2 by RT PCR NEGATIVE NEGATIVE Final    Comment: (NOTE) SARS-CoV-2 target nucleic acids are NOT DETECTED.  The SARS-CoV-2 RNA is generally detectable in upper respiratory specimens during the acute phase of infection. The lowest concentration of SARS-CoV-2 viral copies this assay can detect is 138 copies/mL. A negative result does not preclude SARS-Cov-2 infection and should not be used as the sole basis for treatment or other patient management decisions. A negative result may occur with  improper specimen collection/handling, submission of specimen other than nasopharyngeal swab, presence of viral mutation(s) within the areas targeted by this assay, and inadequate number of viral copies(<138 copies/mL). A negative result must be combined with clinical observations, patient history, and epidemiological information. The expected result is Negative.  Fact Sheet for Patients:  EntrepreneurPulse.com.au  Fact Sheet for Healthcare Providers:  IncredibleEmployment.be  This test is no t yet approved or cleared by the [place] FDA and  has been authorized for detection and/or diagnosis of SARS-CoV-2 by FDA under an Emergency Use Authorization (EUA). This EUA will remain  in effect (meaning this test can be used) for the duration of the COVID-19 declaration under Section 564(b)(1) of the Act, 21 U.S.C.section 360bbb-3(b)(1), unless the authorization is terminated  or revoked sooner.       Influenza A by PCR NEGATIVE NEGATIVE Final   Influenza B by PCR NEGATIVE NEGATIVE Final    Comment: (NOTE) The Xpert Xpress  SARS-CoV-2/FLU/RSV plus assay is intended as an aid in the diagnosis of influenza from Nasopharyngeal swab specimens and should not be used as a sole basis for treatment. Nasal washings and aspirates are unacceptable for Xpert Xpress SARS-CoV-2/FLU/RSV testing.  Fact Sheet for Patients: EntrepreneurPulse.com.au  Fact Sheet for Healthcare Providers: IncredibleEmployment.be  This test is not yet approved or cleared by the [place] FDA and has been authorized for detection and/or diagnosis of SARS-CoV-2 by FDA under an Emergency Use Authorization (EUA). This EUA will remain in effect (meaning this test can  be used) for the duration of the COVID-19 declaration under Section 564(b)(1) of the Act, 21 U.S.C. section 360bbb-3(b)(1), unless the authorization is terminated or revoked.  Performed at [facility], [address], [place], [place] [address]   Aerobic/Anaerobic Culture w Gram Stain (surgical/deep wound)     Status: None   Collection Time: 05/28/21  4:50 PM   Specimen: Abscess  Result Value Ref Range Status   Specimen Description   Final    ABSCESS Performed at [facility], [address], [place], [place] [address]    Special Requests   Final    ANTERIOR THIGH Performed at [facility], [address] [place], [place] [address]    Gram Stain   Final    NO SQUAMOUS EPITHELIAL CELLS SEEN FEW WBC SEEN ABUNDANT GRAM POSITIVE COCCI    Culture   Final    ABUNDANT STAPHYLOCOCCUS AUREUS NO ANAEROBES ISOLATED Performed at [facility], [place] [address], [place], [place] [address]    Report Status 06/02/2021 FINAL  Final   Organism ID, Bacteria STAPHYLOCOCCUS AUREUS  Final      Susceptibility   Staphylococcus aureus - MIC*    CIPROFLOXACIN <=0.5 SENSITIVE Sensitive     ERYTHROMYCIN <=0.25 SENSITIVE Sensitive     GENTAMICIN <=0.5 SENSITIVE Sensitive     OXACILLIN 0.5 SENSITIVE Sensitive      TETRACYCLINE <=1 SENSITIVE Sensitive     VANCOMYCIN <=0.5 SENSITIVE Sensitive     TRIMETH/SULFA <=10 SENSITIVE Sensitive     CLINDAMYCIN <=0.25 SENSITIVE Sensitive     RIFAMPIN <=0.5 SENSITIVE Sensitive     Inducible Clindamycin NEGATIVE Sensitive     * ABUNDANT STAPHYLOCOCCUS AUREUS      Radiology Studies: No results found.   Scheduled Meds:  enoxaparin (LOVENOX) injection  40 mg Subcutaneous Q24H   feeding supplement (GLUCERNA SHAKE)  237 mL Oral TID BM   fentaNYL (SUBLIMAZE) injection  50 mcg Intravenous Once   insulin aspart  0-20 Units Subcutaneous TID WC   insulin aspart  7 Units Subcutaneous TID WC   insulin glargine-yfgn  16 Units Subcutaneous QHS   lisinopril  20 mg Oral Daily   metoprolol succinate  50 mg Oral Daily   multivitamin with minerals  1 tablet Oral Daily   rifampin  300 mg Oral Q12H   sodium chloride flush  5 mL Intracatheter Q8H   sodium chloride flush  5 mL Intracatheter Q8H   Continuous Infusions:   ceFAZolin (ANCEF) IV 2 g (06/04/21 1511)     LOS: 8 days     Enzo Bi, MD [facility] If 7PM-7AM, please contact night-coverage 06/04/2021, 6:14 PM

## 2021-06-04 NOTE — Progress Notes (Signed)
Inpatient Diabetes Program Recommendations  AACE/ADA: New Consensus Statement on Inpatient Glycemic Control (2015)  Target Ranges:  Prepandial:   less than 140 mg/dL      Peak postprandial:   less than 180 mg/dL (1-2 hours)      Critically ill patients:  140 - 180 mg/dL    Latest Reference Range & Units 06/03/21 07:25 06/03/21 11:30 06/03/21 16:14 06/03/21 20:31 06/03/21 22:09  Glucose-Capillary 70 - 99 mg/dL 249 (H) 149 (H) 156 (H) 167 (H) 208 (H)    Latest Reference Range & Units 06/04/21 08:31  Glucose-Capillary 70 - 99 mg/dL 265 (H)     Home DM Meds: Metformin XR 1000 mg QPM     Semglee 16 units daily  Current Orders: Semglee 14 units QHS Novolog 0-20 units TID Novolog 7 units TID with meals    MD- Note AM CBGs >200 the last 2 mornings  Please consider Increasing the Semglee to 18 units QHS     --Will follow patient during hospitalization--  Wyn Quaker RN, MSN, CDE Diabetes Coordinator Inpatient Glycemic Control Team Team Pager: [phone] (8a-5p)

## 2021-06-04 NOTE — Progress Notes (Addendum)
PROGRESS NOTE    April Chaney  UUV:[id number] DOB: March 08, 1971 DOA: 05/27/2021 PCP: Marinda Elk, MD  160A/160A-AA   Assessment & Plan:   Principal Problem:   Abscess of left thigh   April Chaney is a 50 y.o. female with medical history significant for type 2 diabetes mellitus, rheumatoid arthritis, hypertension and GERD, coming to the emergency room with acute onset worsening left thigh and groin pain with associated swelling for the last couple weeks worsening over the last week.  She stated it has become throbbing.  She admitted to occasional fever and chills.  She denies any nausea or vomiting or abdominal pain.  No cough or wheezing or dyspnea.  No chest pain or palpitations.  The patient is status post left total hip arthroplasty in 2011 at [facility].  Venous Doppler came back negative for DVT, but was concerning for large fluid collection in the thigh extending from the left groin to just above the knee.  Left femur CT was recommended and it showed findings suspicious for cellulitis and severe soft tissue infection involving the anterior and medial compartment of the thigh with large rim-enhancing intramuscular fluid collections likely abscesses and soft tissue edema without definite collection in the posterior compartment.  There was additional collection coursing around the greater and lesser trochanter at the proximal femur.  There was focal bony defect within the greater trochanter adjacent to a rim-enhancing collection with mild sclerosis and periostitis suggesting possible osteomyelitis of the greater trochanter and/or postsurgical change.  There was subchondral insufficiency fracture of the medial and lateral femoral condyles of the knee.  CT of the left leg/tib/fib showed superficial soft tissue swelling of the left lower leg likely be seen with cellulitis with  no drainable collection and subacute distal fibula, medial malleolar and posterior malleolar fractures  with old healed proximal fibular diaphyseal fracture.  This was done after an x-ray showed suspected osteomyelitis.    The patient has been in the ER for a while with attempts to be transferred to [facility] as well as [facility] without success as initial recommendation was made for IR drainage and IV antibiotics.  Contact was made with Dr. Hampton Abbot who recommended orthopedic evaluation given the fact that this abscess is reaching the hip joint space.   Dr. Leim Fabry was contacted and he agreed with patient's admission here and IR consult and drain placement with IV antibiotics.   # Abscesses of left thigh: w/ extension to left joint space.  # S/p IR guided aspiration of L anterior thigh and lateral thigh abscesses with drain placement on 12/6 # Hx of previous left total hip arthroplasty in 2011.  --continues to spike fever to 100.4 at least once daily.  Will need surgical I/D. --Cx from abscess grew MSSA, abx narroed to cefazolin on 12/9 Plan: --cont cefazolin --add rifampin by ID --pending transfer to [facility]  Severe sepsis:  met criteria w/ tachycardia, tachypnea, leukocytosis, elevated lactic acid and abscess of left thigh.    DM2: poorly uncontrolled.  --A1c 9.2 --cont glargine 14u daily --cont mealtime 7u TID --SSI TID --allow regular diet due to pt not eating well   HTN:  --BP wnl --cont home Lisinopril and Toprol   RA:  --hold home leflunomide due to severe active infection  Hypokalemia --monitor and replete PRN  Poor oral intake --allow regular diet --Ensure supplement  Encephalopathy has been ruled out  DKA ruled out   DVT prophylaxis: Lovenox SQ  Code Status: Full code  Family Communication:  Level of care: Med-Surg Dispo:   The patient is from: home Anticipated d/c is to: [facility] Anticipated d/c date is: whenever bed available   Subjective and Interval History:  Called transfer center for  [facility], and confirmed pt is still on the waiting list for transfer to [facility].   Objective: Vitals:   06/03/21 0724 06/03/21 1128 06/03/21 1634 06/03/21 2012  BP: 124/82 135/83 137/78 (!) 143/82  Pulse: 89 88 97 (!) 101  Resp: _0 Temp: 98.7 F (37.1 C) 99.4 F (37.4 C) 99.8 F (37.7 C) 98 F (36.7 C)  TempSrc:      SpO2: 98% 100% 99% 97%  Weight:      Height:        Intake/Output Summary (Last 24 hours) at 06/04/2021 0255 Last data filed at 06/03/2021 1930 Gross per 24 hour  Intake 240 ml  Output 1915 ml  Net -1675 ml   Filed Weights   05/27/21 0820  Weight: 63.5 kg    Examination:   Constitutional: NAD, AAOx3 HEENT: conjunctivae and lids normal, EOMI CV: No cyanosis.   RESP: normal respiratory effort, on RA Extremities: 2 drains from left thigh with cloudy yellow output SKIN: warm, dry Neuro: II - XII grossly intact.   Psych: anxious mood and affect.     Data Reviewed: I have personally reviewed following labs and imaging studies  CBC: Recent Labs  Lab 05/30/21 0324 05/31/21 0245 06/01/21 0544 06/02/21 0513 06/03/21 0253  WBC 10.3 10.5 9.7 11.8* 11.5*  HGB 9.4* 9.6* 10.5* 10.0* 9.9*  HCT 28.1* 28.6* 31.4* 30.1* 30.4*  MCV 77.2* 78.6* 79.1* 78.2* 80.2  PLT 287 305 382 455* 973*   Basic Metabolic Panel: Recent Labs  Lab 05/30/21 0324 05/31/21 0245 06/01/21 0544 06/02/21 0513 06/03/21 0253  NA 132* 133* 133* 131* 132*  K 3.7 3.5 3.5 3.8 4.0  CL 105 103 100 99 99  CO2 20* _1 GLUCOSE 302* 171* 261* 160* 205*  BUN _2 CREATININE 0.40* 0.47 0.45 0.49 0.45  CALCIUM 7.2* 7.9* 7.8* 7.9* 8.1*  MG 2.0 1.9 2.0 1.9 2.3   GFR: Estimated Creatinine Clearance: 69.2 mL/min (by C-G formula based on SCr of 0.45 mg/dL). Liver Function Tests: Recent Labs  Lab 05/29/21 0306  AST 17  ALT 12  ALKPHOS 60  BILITOT 0.9  PROT 5.9*  ALBUMIN 2.2*   No results for input(s): LIPASE, AMYLASE in the last 168 hours. No  results for input(s): AMMONIA in the last 168 hours. Coagulation Profile: No results for input(s): INR, PROTIME in the last 168 hours.  Cardiac Enzymes: No results for input(s): CKTOTAL, CKMB, CKMBINDEX, TROPONINI in the last 168 hours. BNP (last 3 results) No results for input(s): PROBNP in the last 8760 hours. HbA1C: No results for input(s): HGBA1C in the last 72 hours.  CBG: Recent Labs  Lab 06/03/21 0725 06/03/21 1130 06/03/21 1614 06/03/21 2031 06/03/21 2209  GLUCAP 249* 149* 156* 167* 208*   Lipid Profile: No results for input(s): CHOL, HDL, LDLCALC, TRIG, CHOLHDL, LDLDIRECT in the last 72 hours. Thyroid Function Tests: No results for input(s): TSH, T4TOTAL, FREET4, T3FREE, THYROIDAB in the last 72 hours. Anemia Panel: No results for input(s): VITAMINB12, FOLATE, FERRITIN, TIBC, IRON, RETICCTPCT in the last 72 hours. Sepsis Labs: Recent Labs  Lab 05/28/21 0537  LATICACIDVEN 1.7    Recent Results (from the  past 240 hour(s))  Blood culture (routine x 2)     Status: None   Collection Time: 05/27/21  2:14 PM   Specimen: BLOOD  Result Value Ref Range Status   Specimen Description BLOOD RIGHT ANTECUBITAL  Final   Special Requests   Final    BOTTLES DRAWN AEROBIC ONLY Blood Culture results may not be optimal due to an inadequate volume of blood received in culture bottles   Culture   Final    NO GROWTH 5 DAYS Performed at [facility], [address] [place], [place] [address]    Report Status 06/01/2021 FINAL  Final  Blood culture (routine x 2)     Status: None   Collection Time: 05/27/21  2:14 PM   Specimen: BLOOD  Result Value Ref Range Status   Specimen Description BLOOD BLOOD LEFT FOREARM  Final   Special Requests   Final    BOTTLES DRAWN AEROBIC ONLY Blood Culture results may not be optimal due to an inadequate volume of blood received in culture bottles   Culture   Final    NO GROWTH 5 DAYS Performed at [facility], [address] [place], [place] [address]    Report Status 06/01/2021 FINAL  Final  Resp Panel by RT-PCR (Flu A&B, Covid)     Status: None   Collection Time: 05/27/21  2:53 PM   Specimen: Nasopharyngeal(NP) swabs in vial transport medium  Result Value Ref Range Status   SARS Coronavirus 2 by RT PCR NEGATIVE NEGATIVE Final    Comment: (NOTE) SARS-CoV-2 target nucleic acids are NOT DETECTED.  The SARS-CoV-2 RNA is generally detectable in upper respiratory specimens during the acute phase of infection. The lowest concentration of SARS-CoV-2 viral copies this assay can detect is 138 copies/mL. A negative result does not preclude SARS-Cov-2 infection and should not be used as the sole basis for treatment or other patient management decisions. A negative result may occur with  improper specimen collection/handling, submission of specimen other than nasopharyngeal swab, presence of viral mutation(s) within the areas targeted by this assay, and inadequate number of viral copies(<138 copies/mL). A negative result must be combined with clinical observations, patient history, and epidemiological information. The expected result is Negative.  Fact Sheet for Patients:  EntrepreneurPulse.com.au  Fact Sheet for Healthcare Providers:  IncredibleEmployment.be  This test is no t yet approved or cleared by the [place] FDA and  has been authorized for detection and/or diagnosis of SARS-CoV-2 by FDA under an Emergency Use Authorization (EUA). This EUA will remain  in effect (meaning this test can be used) for the duration of the COVID-19 declaration under Section 564(b)(1) of the Act, 21 U.S.C.section 360bbb-3(b)(1), unless the authorization is terminated  or revoked sooner.       Influenza A by PCR NEGATIVE NEGATIVE Final   Influenza B by PCR NEGATIVE NEGATIVE Final    Comment: (NOTE) The Xpert Xpress SARS-CoV-2/FLU/RSV plus assay is intended as an aid in the  diagnosis of influenza from Nasopharyngeal swab specimens and should not be used as a sole basis for treatment. Nasal washings and aspirates are unacceptable for Xpert Xpress SARS-CoV-2/FLU/RSV testing.  Fact Sheet for Patients: EntrepreneurPulse.com.au  Fact Sheet for Healthcare Providers: IncredibleEmployment.be  This test is not yet approved or cleared by the [place] FDA and has been authorized for detection and/or diagnosis of SARS-CoV-2 by FDA under an Emergency Use Authorization (EUA). This EUA will remain in effect (meaning this test can be used) for the  duration of the COVID-19 declaration under Section 564(b)(1) of the Act, 21 U.S.C. section 360bbb-3(b)(1), unless the authorization is terminated or revoked.  Performed at [facility], [address] [place], [place] [address]   Aerobic/Anaerobic Culture w Gram Stain (surgical/deep wound)     Status: None   Collection Time: 05/28/21  4:50 PM   Specimen: Abscess  Result Value Ref Range Status   Specimen Description   Final    ABSCESS Performed at [facility], [address], [place], [place] [address]    Special Requests   Final    ANTERIOR THIGH Performed at [facility], [address] [place], [place] [address]    Gram Stain   Final    NO SQUAMOUS EPITHELIAL CELLS SEEN FEW WBC SEEN ABUNDANT GRAM POSITIVE COCCI    Culture   Final    ABUNDANT STAPHYLOCOCCUS AUREUS NO ANAEROBES ISOLATED Performed at [facility], [place] [address] [place], [place] [address]    Report Status 06/02/2021 FINAL  Final   Organism ID, Bacteria STAPHYLOCOCCUS AUREUS  Final      Susceptibility   Staphylococcus aureus - MIC*    CIPROFLOXACIN <=0.5 SENSITIVE Sensitive     ERYTHROMYCIN <=0.25 SENSITIVE Sensitive     GENTAMICIN <=0.5 SENSITIVE Sensitive     OXACILLIN 0.5 SENSITIVE Sensitive     TETRACYCLINE <=1 SENSITIVE Sensitive     VANCOMYCIN <=0.5  SENSITIVE Sensitive     TRIMETH/SULFA <=10 SENSITIVE Sensitive     CLINDAMYCIN <=0.25 SENSITIVE Sensitive     RIFAMPIN <=0.5 SENSITIVE Sensitive     Inducible Clindamycin NEGATIVE Sensitive     * ABUNDANT STAPHYLOCOCCUS AUREUS      Radiology Studies: No results found.   Scheduled Meds:  enoxaparin (LOVENOX) injection  40 mg Subcutaneous Q24H   feeding supplement (GLUCERNA SHAKE)  237 mL Oral TID BM   fentaNYL (SUBLIMAZE) injection  50 mcg Intravenous Once   insulin aspart  0-20 Units Subcutaneous TID WC   insulin aspart  7 Units Subcutaneous TID WC   insulin glargine-yfgn  14 Units Subcutaneous QHS   lisinopril  20 mg Oral Daily   metoprolol succinate  50 mg Oral Daily   multivitamin with minerals  1 tablet Oral Daily   rifampin  300 mg Oral Q12H   sodium chloride flush  5 mL Intracatheter Q8H   sodium chloride flush  5 mL Intracatheter Q8H   Continuous Infusions:   ceFAZolin (ANCEF) IV 2 g (06/03/21 2154)     LOS: 8 days     Enzo Bi, MD [facility] If 7PM-7AM, please contact night-coverage 06/04/2021, 2:55 AM

## 2021-06-05 DIAGNOSIS — T8450XA Infection and inflammatory reaction due to unspecified internal joint prosthesis, initial encounter: Secondary | ICD-10-CM

## 2021-06-05 DIAGNOSIS — A419 Sepsis, unspecified organism: Secondary | ICD-10-CM

## 2021-06-05 DIAGNOSIS — M60059 Infective myositis, unspecified thigh: Secondary | ICD-10-CM

## 2021-06-05 DIAGNOSIS — R6520 Severe sepsis without septic shock: Secondary | ICD-10-CM

## 2021-06-05 LAB — GLUCOSE, CAPILLARY
Glucose-Capillary: 190 mg/dL — ABNORMAL HIGH (ref 70–99)
Glucose-Capillary: 192 mg/dL — ABNORMAL HIGH (ref 70–99)
Glucose-Capillary: 91 mg/dL (ref 70–99)
Glucose-Capillary: 238 mg/dL — ABNORMAL HIGH (ref 70–99)

## 2021-06-05 LAB — BASIC METABOLIC PANEL
Anion gap: 8 (ref 5–15)
BUN: 10 mg/dL (ref 6–20)
CO2: 26 mmol/L (ref 22–32)
Calcium: 8.8 mg/dL — ABNORMAL LOW (ref 8.9–10.3)
Chloride: 97 mmol/L — ABNORMAL LOW (ref 98–111)
Creatinine, Ser: 0.37 mg/dL — ABNORMAL LOW (ref 0.44–1.00)
GFR, Estimated: >60 mL/min (ref >60)
Glucose, Bld: 173 mg/dL — ABNORMAL HIGH (ref 70–99)
Potassium: 4 mmol/L (ref 3.5–5.1)
Sodium: 131 mmol/L — ABNORMAL LOW (ref 135–145)

## 2021-06-05 LAB — CBC
HCT: 30.9 % — ABNORMAL LOW (ref 36.0–46.0)
Hemoglobin: 10.3 g/dL — ABNORMAL LOW (ref 12.0–15.0)
MCH: 26.5 pg (ref 26.0–34.0)
MCHC: 33.3 g/dL (ref 30.0–36.0)
MCV: 79.6 fL — ABNORMAL LOW (ref 80.0–100.0)
Platelets: 716 10*3/uL — ABNORMAL HIGH (ref 150–400)
RBC: 3.88 MIL/uL (ref 3.87–5.11)
RDW: 15.3 % (ref 11.5–15.5)
WBC: 12.8 10*3/uL — ABNORMAL HIGH (ref 4.0–10.5)
nRBC: 0 % (ref 0.0–0.2)

## 2021-06-05 LAB — MAGNESIUM: Magnesium: 2.1 mg/dL (ref 1.7–2.4)

## 2021-06-05 MED ORDER — CEFAZOLIN SODIUM-DEXTROSE 2-4 GM/100ML-% IV SOLN: 2.0000 g | Freq: Three times a day (TID) | INTRAVENOUS | Status: DC

## 2021-06-05 NOTE — Progress Notes (Signed)
Date of Admission:  05/27/2021     ID: April Chaney is a 50 y.o. female  Principal Problem:   Abscess of left thigh    Subjective: Still waiting for transfer, tearful because she has not been transferred. No fever     enoxaparin (LOVENOX) injection  40 mg Subcutaneous Q24H   feeding supplement (GLUCERNA SHAKE)  237 mL Oral TID BM   fentaNYL (SUBLIMAZE) injection  50 mcg Intravenous Once   insulin aspart  0-20 Units Subcutaneous TID WC   insulin aspart  7 Units Subcutaneous TID WC   insulin glargine-yfgn  16 Units Subcutaneous QHS   lisinopril  20 mg Oral Daily   metoprolol succinate  50 mg Oral Daily   multivitamin with minerals  1 tablet Oral Daily   rifampin  300 mg Oral Q12H   sodium chloride flush  5 mL Intracatheter Q8H   sodium chloride flush  5 mL Intracatheter Q8H    Objective: Patient Vitals for the past 24 hrs:  BP Temp Pulse Resp SpO2  06/05/21 1131 108/80 98.3 F (36.8 C) 96 18 98 %  06/05/21 0814 113/70 97.9 F (36.6 C) 93 18 98 %  06/05/21 0342 122/82 98.6 F (37 C) 91 19 97 %  06/05/21 0022 125/72 97.8 F (36.6 C) 87 18 100 %  06/04/21 1927 130/77 99.5 F (37.5 C) 98 16 97 %  06/04/21 1547 121/81 99.1 F (37.3 C) 100 16 100 %    PHYSICAL EXAM:  General: Alert, cooperative, no distress, tearful Lungs: Clear to auscultation bilaterally. No Wheezing or Rhonchi. No rales. Heart: Regular rate and rhythm, no murmur, rub or gallop. Abdomen: Soft, non-tender,not distended. Bowel sounds normal. No masses Extremities: left thigh swelling slightly improved Drains present Skin: No rashes or lesions. Or bruising Lymph: Cervical, supraclavicular normal. Neurologic: Grossly non-focal  Lab Results Recent Labs    06/04/21 0301 06/05/21 0133  WBC 13.7* 12.8*  HGB 9.6* 10.3*  HCT 29.4* 30.9*  NA 131* 131*  K 3.9 4.0  CL 97* 97*  CO2 24 26  BUN 9 10  CREATININE 0.54 0.37*   Microbiology: Abscess culture- staph aureus-[facility] 05/27/21  NG   Continued severe deep soft tissue infection of the left thigh.New large multiloculated area of rim enhancement in the posterior compartment, concerning for a combination of myofasciitis/abscess and myonecrosis.2. Anterior and medial compartment intramuscular abscesses are not significantly changed in size, despite interval drain placement in the anterior compartment collection.   Assessment/Plan: Encephalopathy/confusion--resolved  Left thigh abscesses extensive, deep involving the anterior and medial compartment. IR placed drain.  ortho seen the patient   Needs extensive surgery and has to be transferred to an academic center. 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 Staph aureus in culture - on cefazolin Rifampin  added but concern for rifampin resistance developing because of increased bioburden and no surgical wash out being done She will need cefazolin+ rifampin minimum 6 weeks and also the duration depends on whether the hip prosthesis is infected and whether it will be removed or left behind If transfer is being delayed Repeat CT done yesterday showed worsening collections in the left thigh.  If she is not getting transferred and we need to contact IR to place more drains.     Rheumatoid arthritis on leflunomide which is on hold.  Used to be on IL-6 inhibitor   Diabetes mellitus poorly controlled  Hypertension on lisinopril and metoprolol  Past history of skin infections.  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

## 2021-06-05 NOTE — Progress Notes (Signed)
Mobility Specialist - Progress Note   06/05/21 1137  Mobility  Activity Ambulated in room  Level of Assistance Standby assist, set-up cues, supervision of patient - no hands on  Assistive Device Front wheel walker  Distance Ambulated (ft) 20 ft  Mobility Ambulated with assistance in room  Mobility Response Tolerated well  Mobility performed by Mobility specialist  $Mobility charge 1 Mobility    Pt lying in bed upon arrival, utilizing RA. Initially denied pain. Able to exit bed with supervision and extended time. Ambulated in room with RW, distanced limited d/t pain increasing to 10/10. Pt returned to bed with minA for LE support, needs in reach.    Kathee Delton Mobility Specialist 06/05/21, 11:39 AM

## 2021-06-05 NOTE — Progress Notes (Signed)
PROGRESS NOTE    Shimeka Pracht  AJO:[id number] DOB: 05-Oct-1970 DOA: 05/27/2021 PCP: Marinda Elk, MD    Brief Narrative:   April Chaney is a 50 y.o. female with medical history significant for type 2 diabetes mellitus, rheumatoid arthritis, bilateral THA, hypertension who presented to the emergency room with acute onset worsening left thigh and groin pain with associated swelling for the last couple weeks worsening over the last week.  She was found to have multiple large fluid collection in the thigh extending from the left groin on to above the knee.  Assessment & Plan:   Principal Problem:   Abscess of left thigh  Severe sepsis due to MSSA. Multiple abscess in the left thigh due to MSSA. S/p IR drain placement on 12/16. Repeated CT scan on 12/13 showed much large abscess present.  Patient was waiting for transfer to [facility]  Contact transfer center from [facility] antrum, was told may have a bed this evening.  Continue current antibiotic coverage with cefazolin. I will discuss with interventional radiology again tomorrow to drain the large compartment of abscess if patient does not get transferred overnight. Discussed with ID.  Uncontrolled type 2 diabetes.   A1c 9.2.  Continue current insulin regimen.    DVT prophylaxis: Discontinue Lovenox, for pending repeat procedure. Code Status: full Family Communication:  Disposition Plan:    Status is: Inpatient  Remains inpatient appropriate because: Severity of disease and IV antibiotics.        I/O last 3 completed shifts: In: 689.2 [I.V.:11.7; Other:30; IV Piggyback:647.5] Out: 2060 [Urine:1950; Drains:110] Total I/O In: 113.6 [I.V.:3.6; Other:10; IV Piggyback:100] Out: 175 [Urine:150; Drains:25]     Consultants:  ID, surgery  Procedures: IR drain  Antimicrobials: Cefazolin  Subjective: Patient feels well today, no significant pain.  She is frustrated that she was told can be  transferred, but still not have a bed. No fever or chills. No dysuria hematuria  No abdominal pain or nausea vomiting  Objective: Vitals:   06/05/21 0342 06/05/21 0814 06/05/21 1131 06/05/21 1536  BP: 122/82 113/70 108/80 101/71  Pulse: 91 93 96 (!) 105  Resp: 19 18 18 18   Temp: 98.6 F (37 C) 97.9 F (36.6 C) 98.3 F (36.8 C) 98.8 F (37.1 C)  TempSrc:      SpO2: 97% 98% 98% 97%  Weight:      Height:        Intake/Output Summary (Last 24 hours) at 06/05/2021 1708 Last data filed at 06/05/2021 1333 Gross per 24 hour  Intake 335.29 ml  Output 375 ml  Net -39.71 ml   Filed Weights   05/27/21 0820  Weight: 63.5 kg    Examination:  General exam: Appears calm and comfortable  Respiratory system: Clear to auscultation. Respiratory effort normal. Cardiovascular system: S1 & S2 heard, RRR. No JVD, murmurs, rubs, gallops or clicks. No pedal edema. Gastrointestinal system: Abdomen is nondistended, soft and nontender. No organomegaly or masses felt. Normal bowel sounds heard. Central nervous system: Alert and oriented. No focal neurological deficits. Extremities: left thigh swelling Skin: No rashes, lesions or ulcers Psychiatry: Judgement and insight appear normal. Mood & affect appropriate.     Data Reviewed: I have personally reviewed following labs and imaging studies  CBC: Recent Labs  Lab 06/01/21 0544 06/02/21 0513 06/03/21 0253 06/04/21 0301 06/05/21 0133  WBC 9.7 11.8* 11.5* 13.7* 12.8*  HGB 10.5* 10.0* 9.9* 9.6* 10.3*  HCT 31.4* 30.1* 30.4* 29.4* 30.9*  MCV 79.1* 78.2* 80.2 78.6*  79.6*  PLT 382 455* 523* 609* 517*   Basic Metabolic Panel: Recent Labs  Lab 06/01/21 0544 06/02/21 0513 06/03/21 0253 06/04/21 0301 06/05/21 0133  NA 133* 131* 132* 131* 131*  K 3.5 3.8 4.0 3.9 4.0  CL 100 99 99 97* 97*  CO2 26 24 26 24 26   GLUCOSE 261* 160* 205* 217* 173*  BUN 8 6 8 9 10   CREATININE 0.45 0.49 0.45 0.54 0.37*  CALCIUM 7.8* 7.9* 8.1* 8.1* 8.8*  MG  2.0 1.9 2.3 1.9 2.1   GFR: Estimated Creatinine Clearance: 69.2 mL/min (A) (by C-G formula based on SCr of 0.37 mg/dL (L)). Liver Function Tests: No results for input(s): AST, ALT, ALKPHOS, BILITOT, PROT, ALBUMIN in the last 168 hours. No results for input(s): LIPASE, AMYLASE in the last 168 hours. No results for input(s): AMMONIA in the last 168 hours. Coagulation Profile: No results for input(s): INR, PROTIME in the last 168 hours. Cardiac Enzymes: No results for input(s): CKTOTAL, CKMB, CKMBINDEX, TROPONINI in the last 168 hours. BNP (last 3 results) No results for input(s): PROBNP in the last 8760 hours. HbA1C: No results for input(s): HGBA1C in the last 72 hours. CBG: Recent Labs  Lab 06/04/21 2102 06/04/21 2134 06/05/21 0732 06/05/21 1128 06/05/21 1534  GLUCAP 163* 177* 190* 192* 91   Lipid Profile: No results for input(s): CHOL, HDL, LDLCALC, TRIG, CHOLHDL, LDLDIRECT in the last 72 hours. Thyroid Function Tests: No results for input(s): TSH, T4TOTAL, FREET4, T3FREE, THYROIDAB in the last 72 hours. Anemia Panel: No results for input(s): VITAMINB12, FOLATE, FERRITIN, TIBC, IRON, RETICCTPCT in the last 72 hours. Sepsis Labs: No results for input(s): PROCALCITON, LATICACIDVEN in the last 168 hours.  Recent Results (from the past 240 hour(s))  Blood culture (routine x 2)     Status: None   Collection Time: 05/27/21  2:14 PM   Specimen: BLOOD  Result Value Ref Range Status   Specimen Description BLOOD RIGHT ANTECUBITAL  Final   Special Requests   Final    BOTTLES DRAWN AEROBIC ONLY Blood Culture results may not be optimal due to an inadequate volume of blood received in culture bottles   Culture   Final    NO GROWTH 5 DAYS Performed at [facility], [address] [place], [place] [address]    Report Status 06/01/2021 FINAL  Final  Blood culture (routine x 2)     Status: None   Collection Time: 05/27/21  2:14 PM   Specimen: BLOOD  Result Value Ref Range  Status   Specimen Description BLOOD BLOOD LEFT FOREARM  Final   Special Requests   Final    BOTTLES DRAWN AEROBIC ONLY Blood Culture results may not be optimal due to an inadequate volume of blood received in culture bottles   Culture   Final    NO GROWTH 5 DAYS Performed at [facility], [address] [place], [place] [address]    Report Status 06/01/2021 FINAL  Final  Resp Panel by RT-PCR (Flu A&B, Covid)     Status: None   Collection Time: 05/27/21  2:53 PM   Specimen: Nasopharyngeal(NP) swabs in vial transport medium  Result Value Ref Range Status   SARS Coronavirus 2 by RT PCR NEGATIVE NEGATIVE Final    Comment: (NOTE) SARS-CoV-2 target nucleic acids are NOT DETECTED.  The SARS-CoV-2 RNA is generally detectable in upper respiratory specimens during the acute phase of infection. The lowest concentration of SARS-CoV-2 viral copies this assay can detect is 138  copies/mL. A negative result does not preclude SARS-Cov-2 infection and should not be used as the sole basis for treatment or other patient management decisions. A negative result may occur with  improper specimen collection/handling, submission of specimen other than nasopharyngeal swab, presence of viral mutation(s) within the areas targeted by this assay, and inadequate number of viral copies(<138 copies/mL). A negative result must be combined with clinical observations, patient history, and epidemiological information. The expected result is Negative.  Fact Sheet for Patients:  EntrepreneurPulse.com.au  Fact Sheet for Healthcare Providers:  IncredibleEmployment.be  This test is no t yet approved or cleared by the [place] FDA and  has been authorized for detection and/or diagnosis of SARS-CoV-2 by FDA under an Emergency Use Authorization (EUA). This EUA will remain  in effect (meaning this test can be used) for the duration of the COVID-19 declaration under  Section 564(b)(1) of the Act, 21 U.S.C.section 360bbb-3(b)(1), unless the authorization is terminated  or revoked sooner.       Influenza A by PCR NEGATIVE NEGATIVE Final   Influenza B by PCR NEGATIVE NEGATIVE Final    Comment: (NOTE) The Xpert Xpress SARS-CoV-2/FLU/RSV plus assay is intended as an aid in the diagnosis of influenza from Nasopharyngeal swab specimens and should not be used as a sole basis for treatment. Nasal washings and aspirates are unacceptable for Xpert Xpress SARS-CoV-2/FLU/RSV testing.  Fact Sheet for Patients: EntrepreneurPulse.com.au  Fact Sheet for Healthcare Providers: IncredibleEmployment.be  This test is not yet approved or cleared by the [place] FDA and has been authorized for detection and/or diagnosis of SARS-CoV-2 by FDA under an Emergency Use Authorization (EUA). This EUA will remain in effect (meaning this test can be used) for the duration of the COVID-19 declaration under Section 564(b)(1) of the Act, 21 U.S.C. section 360bbb-3(b)(1), unless the authorization is terminated or revoked.  Performed at [facility], [address] [place], [place] [address]   Aerobic/Anaerobic Culture w Gram Stain (surgical/deep wound)     Status: None   Collection Time: 05/28/21  4:50 PM   Specimen: Abscess  Result Value Ref Range Status   Specimen Description   Final    ABSCESS Performed at [facility], [address] [place], [place] [address]    Special Requests   Final    ANTERIOR THIGH Performed at [facility], [address] [place], [place] [address]    Gram Stain   Final    NO SQUAMOUS EPITHELIAL CELLS SEEN FEW WBC SEEN ABUNDANT GRAM POSITIVE COCCI    Culture   Final    ABUNDANT STAPHYLOCOCCUS AUREUS NO ANAEROBES ISOLATED Performed at [facility], [place] [address], [place], [place] [address]    Report Status 06/02/2021 FINAL  Final   Organism ID, Bacteria  STAPHYLOCOCCUS AUREUS  Final      Susceptibility   Staphylococcus aureus - MIC*    CIPROFLOXACIN <=0.5 SENSITIVE Sensitive     ERYTHROMYCIN <=0.25 SENSITIVE Sensitive     GENTAMICIN <=0.5 SENSITIVE Sensitive     OXACILLIN 0.5 SENSITIVE Sensitive     TETRACYCLINE <=1 SENSITIVE Sensitive     VANCOMYCIN <=0.5 SENSITIVE Sensitive     TRIMETH/SULFA <=10 SENSITIVE Sensitive     CLINDAMYCIN <=0.25 SENSITIVE Sensitive     RIFAMPIN <=0.5 SENSITIVE Sensitive     Inducible Clindamycin NEGATIVE Sensitive     * ABUNDANT STAPHYLOCOCCUS AUREUS         Radiology Studies: CT FEMUR LEFT W CONTRAST  Result Date: 06/05/2021  CLINICAL DATA:  Acute onset left groin and thigh pain with swelling over the past few weeks. Recent abscess drainage. EXAM: CT OF THE LOWER RIGHT EXTREMITY WITH CONTRAST TECHNIQUE: Multidetector CT imaging of the lower right extremity was performed according to the standard protocol following intravenous contrast administration. CONTRAST:  171mL OMNIPAQUE IOHEXOL 300 MG/ML  SOLN COMPARISON:  Left hip x-rays dated May 31, 2021. CT left femur dated May 27, 2021. FINDINGS: Bones/Joint/Cartilage Prior left total hip arthroplasty. No evidence of hardware failure or loosening. Small bony defect in the greater trochanter is unchanged (series 2, image 84). No progressive bony destruction. Similar mild tricompartmental degenerative changes of the left knee with unchanged linear subchondral sclerosis in the medial and lateral femoral condyles, likely subchondral insufficiency fractures. No hip joint effusion. Trace left knee effusion. Ligaments Ligaments are suboptimally evaluated by CT. Muscles and Tendons As before, there are multiple intramuscular rim enhancing fluid collections in the left thigh. The multiloculated collection in the anterior compartment, which has undergone interval drain placement, currently measures 2.1 x 6.9 x 18.6 cm (AP by transverse by CC), previously 2.3 x 5.8 x 17.5  cm. The multiloculated collection in the medial compartment measures approximally 2.6 x 7.3 x 29.2 cm and is not significantly changed. New large multiloculated area of rim enhancement in the posterior compartment measuring approximally 7.5 x 7.5 x 31.5 cm, demonstrating mixed low and intermediate density with areas of maintained muscle architecture, suggesting a combination of abscess and myonecrosis. The previously described peritrochanteric fluid collection has undergone interval drain placement and has since resolved. Soft tissue Diffuse soft tissue swelling of the left thigh. No soft tissue mass. IMPRESSION: 1. Continued severe deep soft tissue infection of the left thigh. New large multiloculated area of rim enhancement in the posterior compartment, concerning for a combination of myofasciitis/abscess and myonecrosis. 2. Anterior and medial compartment intramuscular abscesses are not significantly changed in size, despite interval drain placement in the anterior compartment collection. 3. Interval resolution of the previously described peritrochanteric abscess status post drain placement. 4. Unchanged small bony defect in the greater trochanter, which may be postsurgical or reflect osteomyelitis. Electronically Signed   By: Titus Dubin M.D.   On: 06/05/2021 09:15        Scheduled Meds:  enoxaparin (LOVENOX) injection  40 mg Subcutaneous Q24H   feeding supplement (GLUCERNA SHAKE)  237 mL Oral TID BM   fentaNYL (SUBLIMAZE) injection  50 mcg Intravenous Once   insulin aspart  0-20 Units Subcutaneous TID WC   insulin aspart  7 Units Subcutaneous TID WC   insulin glargine-yfgn  16 Units Subcutaneous QHS   lisinopril  20 mg Oral Daily   metoprolol succinate  50 mg Oral Daily   multivitamin with minerals  1 tablet Oral Daily   rifampin  300 mg Oral Q12H   sodium chloride flush  5 mL Intracatheter Q8H   sodium chloride flush  5 mL Intracatheter Q8H   Continuous Infusions:  sodium chloride 10  mL/hr at 06/05/21 0502    ceFAZolin (ANCEF) IV 2 g (06/05/21 1333)     LOS: 9 days    Time spent: 32 minutes    Sharen Hones, MD [facility]   To contact the attending provider between 7A-7P or the covering provider during after hours 7P-7A, please log into the web site [email or web address] and access using universal [place] password for that web site. If you do not have the password, please call the hospital operator.  06/05/2021, 5:08 PM

## 2021-06-05 NOTE — TOC Progression Note (Signed)
Transition of Care [facility]) - Progression Note    Patient Details  Name: April Chaney MRN: [id number] Date of Birth: [birth year]  Transition of Care [facility]) CM/SW Contact  Conception Oms, RN Phone Number: 06/05/2021, 12:37 PM  Clinical Narrative:   Called [facility] transfer line at  [phone], Spoke with Alvan Dame, he stated that they do not have a medical bed yet but they are still waiting, he is hoping one will come available today         Expected Discharge Plan and Services                                                 Social Determinants of Health (SDOH) Interventions    Readmission Risk Interventions No flowsheet data found.

## 2021-06-05 NOTE — Progress Notes (Signed)
Received call from [facility] with [facility], he stated bed is now available at [facility] (room 662).  Report called and given to nurse Larene Beach at [phone].

## 2021-06-05 NOTE — Progress Notes (Signed)
JP drain is in place via, stat lock. No dressing has been applied. No drainage around site. No instructions  for dressing changes in orders.  I did not have a dressing to change at 0500.

## 2021-06-05 NOTE — Discharge Summary (Signed)
Physician Discharge Summary  Patient ID: April Chaney MRN: [id number] DOB/AGE: 25-Jun-1970 50 y.o.  Admit date: 05/27/2021 Discharge date: 06/05/2021  Admission Diagnoses:  Discharge Diagnoses:  Principal Problem:   Abscess of left thigh Active Problems:   Severe sepsis [facility])   Discharged Condition: fair  Hospital Course:  April Chaney is a 50 y.o. female with medical history significant for type 2 diabetes mellitus, rheumatoid arthritis, bilateral THA, hypertension who presented to the emergency room with acute onset worsening left thigh and groin pain with associated swelling for the last couple weeks worsening over the last week.     She admitted to occasional fever and chills.  The patient is status post left total hip arthroplasty in 2011 at [facility].    Venous Doppler came back negative for DVT, but was concerning for large fluid collection in the thigh extending from the left groin to just above the knee.  Left femur CT showed abscesses involving the anterior and medial compartment of the thigh.  There was additional collection coursing around the greater and lesser trochanter at the proximal femur.  There was focal bony defect within the greater trochanter adjacent to a rim-enhancing collection with mild sclerosis and periostitis suggesting possible osteomyelitis of the greater trochanter.  There was also subchondral insufficiency fracture of the medial and lateral femoral condyles of the knee.     Of note, pt reported recent left ankle fracture for which she received surgery in [place].  CT of the left leg/tib/fib showed Skin thickening and superficial soft tissue swelling of the left lower leg, as can be seen in cellulitis. No drainable collection.  Also Subacute distal fibula, medial malleolar, and posterior malleolar fractures. Old healed proximal fibular diaphyseal fracture.  Ortho had recommended transfer to tertiary center, so while pt was in the ED, attempts  were made for transfer to [place] as well as [facility], however without success.     # Abscesses of left thigh w/ extension to left joint space.  # S/p IR guided aspiration of L anterior thigh and lateral thigh abscesses with drain placement on 05/28/21 # Hx of previous left total hip arthroplasty in 2011.  --Even after drain placement and being on Vanc and zosyn, pt continues to spike fever to 100.4 at least once daily.   --per our ortho, pt likely requires "multiple surgeries including debridements, possible flap coverage, and likely explant with future revision hip arthroplasty.  Ultimately if infection cannot be appropriately controlled, she may amputation."  Ortho recommended transfer to higher level facility given extensive pathology. --Attempt made again for transfer to [facility] with ortho at [facility] who recommended transfer to [facility] to be seen by Dr. Len Childs (pt's outpatient ortho provider).  Transfer was accepted by hospitalist service at [facility]. --started on vanc/zosyn on presentation.  Cx from abscess grew MSSA, abx narroed to cefazolin on 12/9.  Rifampin added on 12/12 by ID.  Both need to be continued after pt's transfer to [facility]. Repeated CT 12/13 showed multiple abscess, with on much large compartment 7.5/7.5/35. Patient will need surgical intervention.   Severe sepsis:  met criteria w/ tachycardia, tachypnea, leukocytosis, elevated lactic acid and abscess of left thigh.    DM2: poorly uncontrolled.  --A1c 9.2 --discharged on insulin regimen as below. --allowed regular diet due to pt not eating well   HTN:  --BP wnl --cont home Lisinopril and Toprol   RA:  --hold home leflunomide  due to severe active infection   Hypokalemia --monitor and replete PRN   Poor oral intake --pt just didn't want to eat.  Ordered regular diet to give pt more food options, and Glucerna TID, for better wound healing.    Encephalopathy has been ruled out   DKA ruled out   Left buttock ulcer, ruled out     Consults: ID, orthopedics, IR  Significant Diagnostic Studies:  CT:  As before, there are multiple intramuscular rim enhancing fluid collections in the left thigh. The multiloculated collection in the anterior compartment, which has undergone interval drain placement, currently measures 2.1 x 6.9 x 18.6 cm (AP by transverse by CC), previously 2.3 x 5.8 x 17.5 cm.   The multiloculated collection in the medial compartment measures approximally 2.6 x 7.3 x 29.2 cm and is not significantly changed.   New large multiloculated area of rim enhancement in the posterior compartment measuring approximally 7.5 x 7.5 x 31.5 cm, demonstrating mixed low and intermediate density with areas of maintained muscle architecture, suggesting a combination of abscess and myonecrosis.   The previously described peritrochanteric fluid collection has undergone interval drain placement and has since resolved.   Soft tissue Diffuse soft tissue swelling of the left thigh. No soft tissue mass.   IMPRESSION: 1. Continued severe deep soft tissue infection of the left thigh. New large multiloculated area of rim enhancement in the posterior compartment, concerning for a combination of myofasciitis/abscess and myonecrosis. 2. Anterior and medial compartment intramuscular abscesses are not significantly changed in size, despite interval drain placement in the anterior compartment collection. 3. Interval resolution of the previously described peritrochanteric abscess status post drain placement. 4. Unchanged small bony defect in the greater trochanter, which may be postsurgical or reflect osteomyelitis.     Electronically Signed   By: Titus Dubin M.D.   On: 06/05/2021 09:15  Treatments: antibiotics, I&D  Discharge Exam: Blood pressure 101/71, pulse (!) 105, temperature 98.8 F (37.1 C), resp. rate 18,  height 5' (1.524 m), weight 63.5 kg, SpO2 97 %. General appearance: alert and cooperative Resp: clear to auscultation bilaterally Cardio: regular rate and rhythm, S1, S2 normal, no murmur, click, rub or gallop GI: soft, non-tender; bowel sounds normal; no masses,  no organomegaly Extremities: Left thigh swelling  Disposition: Discharge disposition: 02-Transferred to [facility]       Discharge Instructions     Diet - low sodium heart healthy   Complete by: As directed    Discharge wound care:   Complete by: As directed    Transfer to another hospital   Increase activity slowly   Complete by: As directed       Allergies as of 06/05/2021       Reactions   Bactrim [sulfamethoxazole-trimethoprim] Rash   Remo Lipps Johnson's Syndrome, 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 Swallow whole.   ceFAZolin 2-4 GM/100ML-% IVPB Commonly known as: ANCEF Inject 100 mLs (2 g total) into the vein every 8 (eight) hours.   feeding supplement (GLUCERNA SHAKE) Liqd Take 237 mLs by mouth 3 (three) times daily between meals.   insulin aspart 100 UNIT/ML injection Commonly known as: novoLOG Inject 7 Units  into the skin 3 (three) times daily with meals.   insulin glargine-yfgn 100 UNIT/ML Pen Commonly known as: SEMGLEE Inject 16 Units into the skin daily.   leflunomide 20 MG tablet Commonly known as: ARAVA Hold while having active infection. What changed:  how much to take how to take this when to take this additional instructions   lisinopril 20 MG tablet Commonly known as: ZESTRIL Take 20 mg by mouth daily.   metFORMIN 500 MG 24 hr tablet Commonly known as: GLUCOPHAGE-XR Take 1,000 mg by mouth daily with supper.   metoprolol succinate  50 MG 24 hr tablet Commonly known as: TOPROL-XL Take 50 mg by mouth daily.   multivitamin with minerals Tabs tablet Take 1 tablet by mouth daily.   oxyCODONE 5 MG immediate release tablet Commonly known as: Oxy IR/ROXICODONE Take 1 tablet (5 mg total) by mouth every 4 (four) hours as needed for severe pain or moderate pain.   rifampin 300 MG capsule Commonly known as: RIFADIN Take 1 capsule (300 mg total) by mouth every 12 (twelve) hours.               Discharge Care Instructions  (From admission, onward)           Start     Ordered   06/05/21 0000  Discharge wound care:       Comments: Transfer to another hospital   06/05/21 1858           40 minutes  Signed: Sharen Hones 06/05/2021, 6:58 PM

## 2021-12-31 ENCOUNTER — Other Ambulatory Visit: Payer: Self-pay | Admitting: Physician Assistant

## 2021-12-31 DIAGNOSIS — Z1231 Encounter for screening mammogram for malignant neoplasm of breast: Secondary | ICD-10-CM

## 2022-02-18 ENCOUNTER — Ambulatory Visit
Admission: RE | Admit: 2022-02-18 | Discharge: 2022-02-18 | Disposition: A | Discharge status: Home / Self Care | Payer: BC Managed Care – PPO | Source: Ambulatory Visit | Attending: Physician Assistant | Admitting: Physician Assistant

## 2022-02-18 DIAGNOSIS — Z1231 Encounter for screening mammogram for malignant neoplasm of breast: Secondary | ICD-10-CM | POA: Diagnosis present

## 2022-03-07 ENCOUNTER — Other Ambulatory Visit: Payer: Self-pay | Admitting: Physician Assistant

## 2022-03-07 DIAGNOSIS — N6489 Other specified disorders of breast: Secondary | ICD-10-CM

## 2022-03-07 DIAGNOSIS — R928 Other abnormal and inconclusive findings on diagnostic imaging of breast: Secondary | ICD-10-CM

## 2022-03-17 ENCOUNTER — Ambulatory Visit
Admission: RE | Admit: 2022-03-17 | Discharge: 2022-03-17 | Disposition: A | Discharge status: Home / Self Care | Payer: BC Managed Care – PPO | Source: Ambulatory Visit | Attending: Physician Assistant | Admitting: Physician Assistant

## 2022-03-17 DIAGNOSIS — N6489 Other specified disorders of breast: Secondary | ICD-10-CM | POA: Diagnosis present

## 2022-03-17 DIAGNOSIS — R928 Other abnormal and inconclusive findings on diagnostic imaging of breast: Secondary | ICD-10-CM | POA: Insufficient documentation

## 2023-02-10 ENCOUNTER — Other Ambulatory Visit: Payer: Self-pay | Admitting: Physician Assistant

## 2023-02-10 DIAGNOSIS — Z1231 Encounter for screening mammogram for malignant neoplasm of breast: Secondary | ICD-10-CM

## 2023-02-24 ENCOUNTER — Ambulatory Visit
Admission: RE | Admit: 2023-02-24 | Discharge: 2023-02-24 | Disposition: A | Discharge status: Home / Self Care | Payer: BC Managed Care – PPO | Source: Ambulatory Visit | Attending: Physician Assistant | Admitting: Physician Assistant

## 2023-02-24 DIAGNOSIS — Z1231 Encounter for screening mammogram for malignant neoplasm of breast: Secondary | ICD-10-CM | POA: Diagnosis present

## 2023-04-01 ENCOUNTER — Other Ambulatory Visit: Payer: Self-pay | Admitting: Physician Assistant

## 2023-04-01 ENCOUNTER — Ambulatory Visit
Admission: RE | Admit: 2023-04-01 | Discharge: 2023-04-01 | Disposition: A | Discharge status: Home / Self Care | Payer: BC Managed Care – PPO | Source: Ambulatory Visit | Attending: Physician Assistant | Admitting: Physician Assistant

## 2023-04-01 DIAGNOSIS — R296 Repeated falls: Secondary | ICD-10-CM | POA: Diagnosis present

## 2023-04-07 ENCOUNTER — Encounter: Payer: Self-pay | Admitting: Ophthalmology

## 2023-04-14 NOTE — Discharge Instructions (Signed)

## 2023-04-16 ENCOUNTER — Encounter: Payer: Self-pay | Admitting: Ophthalmology

## 2023-04-16 ENCOUNTER — Ambulatory Visit: Payer: BC Managed Care – PPO | Admitting: Anesthesiology

## 2023-04-16 ENCOUNTER — Encounter
Admission: RE | Disposition: A | Discharge status: Home / Self Care | Payer: Self-pay | Source: Home / Self Care | Attending: Ophthalmology

## 2023-04-16 ENCOUNTER — Ambulatory Visit
Admission: RE | Admit: 2023-04-16 | Discharge: 2023-04-16 | Disposition: A | Discharge status: Home / Self Care | Payer: BC Managed Care – PPO | Attending: Ophthalmology | Admitting: Ophthalmology

## 2023-04-16 ENCOUNTER — Other Ambulatory Visit: Payer: Self-pay

## 2023-04-16 DIAGNOSIS — Z7984 Long term (current) use of oral hypoglycemic drugs: Secondary | ICD-10-CM | POA: Insufficient documentation

## 2023-04-16 DIAGNOSIS — I110 Hypertensive heart disease with heart failure: Secondary | ICD-10-CM | POA: Diagnosis not present

## 2023-04-16 DIAGNOSIS — E1136 Type 2 diabetes mellitus with diabetic cataract: Secondary | ICD-10-CM | POA: Insufficient documentation

## 2023-04-16 DIAGNOSIS — H2512 Age-related nuclear cataract, left eye: Secondary | ICD-10-CM | POA: Insufficient documentation

## 2023-04-16 DIAGNOSIS — F17210 Nicotine dependence, cigarettes, uncomplicated: Secondary | ICD-10-CM | POA: Insufficient documentation

## 2023-04-16 DIAGNOSIS — I509 Heart failure, unspecified: Secondary | ICD-10-CM | POA: Diagnosis not present

## 2023-04-16 DIAGNOSIS — Z794 Long term (current) use of insulin: Secondary | ICD-10-CM | POA: Insufficient documentation

## 2023-04-16 HISTORY — PX: CATARACT EXTRACTION W/PHACO: SHX586

## 2023-04-16 LAB — POCT PREGNANCY, URINE: Preg Test, Ur: NEGATIVE

## 2023-04-16 LAB — GLUCOSE, CAPILLARY: Glucose-Capillary: 310 mg/dL — ABNORMAL HIGH (ref 70–99)

## 2023-04-16 SURGERY — PHACOEMULSIFICATION, CATARACT, WITH IOL INSERTION
Anesthesia: Monitor Anesthesia Care | Laterality: Left

## 2023-04-16 MED ORDER — SIGHTPATH DOSE#1 NA HYALUR & NA CHOND-NA HYALUR IO KIT
PACK | INTRAOCULAR | Status: DC | PRN
  Administered 2023-04-16: 1 via OPHTHALMIC

## 2023-04-16 MED ORDER — MIDAZOLAM HCL 2 MG/2ML IJ SOLN
INTRAMUSCULAR | Status: DC | PRN
  Administered 2023-04-16 (×2): 1 mg via INTRAVENOUS

## 2023-04-16 MED ORDER — MIDAZOLAM HCL 2 MG/2ML IJ SOLN
INTRAMUSCULAR | Status: AC
  Filled 2023-04-16: qty 2

## 2023-04-16 MED ORDER — LIDOCAINE HCL (PF) 2 % IJ SOLN
INTRAOCULAR | Status: DC | PRN
  Administered 2023-04-16: 1 mL via INTRAOCULAR

## 2023-04-16 MED ORDER — SIGHTPATH DOSE#1 BSS IO SOLN
INTRAOCULAR | Status: DC | PRN
  Administered 2023-04-16: 59 mL via OPHTHALMIC

## 2023-04-16 MED ORDER — TETRACAINE HCL 0.5 % OP SOLN
OPHTHALMIC | Status: AC
  Filled 2023-04-16: qty 4

## 2023-04-16 MED ORDER — FENTANYL CITRATE (PF) 100 MCG/2ML IJ SOLN
INTRAMUSCULAR | Status: DC | PRN
  Administered 2023-04-16 (×2): 50 ug via INTRAVENOUS

## 2023-04-16 MED ORDER — ARMC OPHTHALMIC DILATING DROPS
OPHTHALMIC | Status: AC
  Filled 2023-04-16: qty 0.5

## 2023-04-16 MED ORDER — SIGHTPATH DOSE#1 BSS IO SOLN
INTRAOCULAR | Status: DC | PRN
  Administered 2023-04-16: 15 mL via INTRAOCULAR

## 2023-04-16 MED ORDER — MOXIFLOXACIN HCL 0.5 % OP SOLN
OPHTHALMIC | Status: DC | PRN
  Administered 2023-04-16: .2 mL via OPHTHALMIC

## 2023-04-16 MED ORDER — TETRACAINE HCL 0.5 % OP SOLN
1.0000 [drp] | OPHTHALMIC | Status: DC | PRN
  Administered 2023-04-16 (×3): 1 [drp] via OPHTHALMIC

## 2023-04-16 MED ORDER — FENTANYL CITRATE (PF) 100 MCG/2ML IJ SOLN
INTRAMUSCULAR | Status: AC
  Filled 2023-04-16: qty 2

## 2023-04-16 MED ORDER — ARMC OPHTHALMIC DILATING DROPS
1.0000 | OPHTHALMIC | Status: DC | PRN
  Administered 2023-04-16 (×3): 1 via OPHTHALMIC

## 2023-04-16 MED ORDER — BRIMONIDINE TARTRATE-TIMOLOL 0.2-0.5 % OP SOLN
OPHTHALMIC | Status: DC | PRN
  Administered 2023-04-16: 1 [drp] via OPHTHALMIC

## 2023-04-16 SURGICAL SUPPLY — 9 items
CATARACT SUITE SIGHTPATH (MISCELLANEOUS) ×1
DISSECTOR HYDRO NUCLEUS 50X22 (MISCELLANEOUS) ×1 IMPLANT
DRSG TEGADERM 2-3/8X2-3/4 SM (GAUZE/BANDAGES/DRESSINGS) ×1 IMPLANT
FEE CATARACT SUITE SIGHTPATH (MISCELLANEOUS) ×1 IMPLANT
GLOVE SURG SYN 7.5 E (GLOVE) ×1
GLOVE SURG SYN 7.5 PF PI (GLOVE) ×1 IMPLANT
GLOVE SURG SYN 8.5 E (GLOVE) ×1
GLOVE SURG SYN 8.5 PF PI (GLOVE) ×1 IMPLANT
LENS IOL TECNIS EYHANCE 19.5 (Intraocular Lens) IMPLANT

## 2023-04-16 NOTE — Op Note (Signed)
OPERATIVE NOTE  April Chaney [id number] 04/16/2023   PREOPERATIVE DIAGNOSIS: Nuclear sclerotic cataract left eye. H25.12   POSTOPERATIVE DIAGNOSIS: Nuclear sclerotic cataract left eye. H25.12   PROCEDURE:  Phacoemusification with posterior chamber intraocular lens placement of the left eye  Ultrasound time: Procedure(s): CATARACT EXTRACTION PHACO AND INTRAOCULAR LENS PLACEMENT (IOC) LEFT DIABETIC 7.53 00:42.8 (Left)  LENS:   Implant Name Type Inv. Item Serial No. Manufacturer Lot No. LRB No. Used Action  LENS IOL TECNIS EYHANCE 19.5 - [id number] Intraocular Lens LENS IOL TECNIS EYHANCE 19.5 [id number] [employer]  Left 1 Implanted      SURGEON:  Julious Payer. Rolley Sims, MD   ANESTHESIA:  Topical with tetracaine drops, augmented with 1% preservative-free intracameral lidocaine.   COMPLICATIONS:  None.   DESCRIPTION OF PROCEDURE:  The patient was identified in the holding room and transported to the operating room and placed in the supine position under the operating microscope.  The left eye was identified as the operative eye, which was prepped and draped in the usual sterile ophthalmic fashion.   A 1 millimeter clear-corneal paracentesis was made inferotemporally. Preservative-free 1% lidocaine mixed with 1:1,000 bisulfite-free aqueous solution of epinephrine was injected into the anterior chamber. The anterior chamber was then filled with Viscoat viscoelastic. A 2.4 millimeter keratome was used to make a clear-corneal incision superotemporally. A curvilinear capsulorrhexis was made with a cystotome and capsulorrhexis forceps. Balanced salt solution was used to hydrodissect and hydrodelineate the nucleus. Phacoemulsification was then used to remove the lens nucleus and epinucleus. The remaining cortex was then removed using the irrigation and aspiration handpiece. Provisc was then placed into the capsular bag to distend it for lens placement. A +19.50 D DIB00 intraocular lens was then  injected into the capsular bag. The remaining viscoelastic was aspirated.   Wounds were hydrated with balanced salt solution.  The anterior chamber was inflated to a physiologic pressure with balanced salt solution.  No wound leaks were noted. Vigamox was injected intracamerally.  Timolol and Brimonidine drops were applied to the eye.  The patient was taken to the recovery room in stable condition without complications of anesthesia or surgery.  [employer] 04/16/2023, 8:40 AM

## 2023-04-16 NOTE — Anesthesia Postprocedure Evaluation (Signed)
Anesthesia Post Note  Patient: April Chaney  Procedure(s) Performed: CATARACT EXTRACTION PHACO AND INTRAOCULAR LENS PLACEMENT (IOC) LEFT DIABETIC 7.53 00:42.8 (Left)  Patient location during evaluation: PACU Anesthesia Type: MAC Level of consciousness: awake and alert Pain management: pain level controlled Vital Signs Assessment: post-procedure vital signs reviewed and stable Respiratory status: spontaneous breathing, nonlabored ventilation, respiratory function stable and patient connected to nasal cannula oxygen Cardiovascular status: stable and blood pressure returned to baseline Postop Assessment: no apparent nausea or vomiting Anesthetic complications: no   No notable events documented.   Last Vitals:  Vitals:   04/16/23 0848 04/16/23 0849  BP:  (!) 151/87  Pulse:    Resp:    Temp:    SpO2: 99%     Last Pain:  Vitals:   04/16/23 0848  TempSrc:   PainSc: 0-No pain                 Lenard Simmer

## 2023-04-16 NOTE — Transfer of Care (Signed)
Immediate Anesthesia Transfer of Care Note  Patient: April Chaney  Procedure(s) Performed: CATARACT EXTRACTION PHACO AND INTRAOCULAR LENS PLACEMENT (IOC) LEFT DIABETIC (Left)  Patient Location: PACU  Anesthesia Type: MAC  Level of Consciousness: awake, alert  and patient cooperative  Airway and Oxygen Therapy: Patient Spontanous Breathing and Patient connected to supplemental oxygen  Post-op Assessment: Post-op Vital signs reviewed, Patient's Cardiovascular Status Stable, Respiratory Function Stable, Patent Airway and No signs of Nausea or vomiting  Post-op Vital Signs: Reviewed and stable  Complications: No notable events documented.

## 2023-04-16 NOTE — H&P (Signed)
[facility]   Primary Care Physician:  Patrice Paradise, MD Ophthalmologist: Dr. Deberah Pelton  Pre-Procedure History & Physical: HPI:  Edye [place] is a 52 y.o. female here for cataract surgery.   Past Medical History:  Diagnosis Date   Arthritis    Colon polyps    Complication of anesthesia    woke up during 1 colonscopy 3 yrs ago   Diabetes mellitus without complication (HCC)    type 2   DM (diabetes mellitus) (HCC)    Family history of adverse reaction to anesthesia    mother slow to awaken after 1 procedure   GERD (gastroesophageal reflux disease)    Hypertension    hx of no bp meds for last 11 years after weight loss   Pancreatitis 3-4 yrs ago and feb 2018   Presence of artificial hip    has to take antibiotics prior to any procedure-dental, etc   Ulcer     Past Surgical History:  Procedure Laterality Date   COLONOSCOPY WITH PROPOFOL N/A 02/11/2019   Procedure: COLONOSCOPY WITH PROPOFOL;  Surgeon: Christena Deem, MD;  Location: [facility] ENDOSCOPY;  Service: Endoscopy;  Laterality: N/A;   colonscopy     x 4   ESOPHAGOGASTRODUODENOSCOPY (EGD) WITH PROPOFOL N/A 02/11/2019   Procedure: ESOPHAGOGASTRODUODENOSCOPY (EGD) WITH PROPOFOL;  Surgeon: Christena Deem, MD;  Location: [facility] ENDOSCOPY;  Service: Endoscopy;  Laterality: N/A;   EUS N/A 01/01/2017   Procedure: UPPER ENDOSCOPIC ULTRASOUND (EUS) LINEAR;  Surgeon: Rachael Fee, MD;  Location: [facility] ENDOSCOPY;  Service: Endoscopy;  Laterality: N/A;   FINE NEEDLE ASPIRATION  01/01/2017   Procedure: FINE NEEDLE ASPIRATION;  Surgeon: Rachael Fee, MD;  Location: [facility] ENDOSCOPY;  Service: Endoscopy;;   INCISION AND DRAINAGE ABSCESS N/A 12/29/2020   Procedure: INCISION AND DRAINAGE ABSCESS;  Surgeon: Carolan Shiver, MD;  Location: [facility] ORS;  Service: General;  Laterality: N/A;   JOINT REPLACEMENT     hip replacement   LEFT HEART CATH AND CORONARY ANGIOGRAPHY N/A 04/26/2019   Procedure: LEFT HEART CATH  AND CORONARY ANGIOGRAPHY;  Surgeon: Marcina Millard, MD;  Location: [facility] INVASIVE CV LAB;  Service: Cardiovascular;  Laterality: N/A;   STRABISMUS SURGERY  12/12/2011   Procedure: REPAIR STRABISMUS;  Surgeon: Shara Blazing, MD;  Location: [facility];  Service: Ophthalmology;  Laterality: Right;   TOTAL HIP ARTHROPLASTY     bilat    Prior to Admission medications   Medication Sig Start Date End Date Taking? Authorizing Provider  insulin glargine-yfgn (SEMGLEE) 100 UNIT/ML Pen Inject 38 Units into the skin daily.   Yes [provider]  lisinopril (ZESTRIL) 20 MG tablet Take 20 mg by mouth daily. 10/29/20  Yes [provider]  metFORMIN (GLUCOPHAGE-XR) 500 MG 24 hr tablet Take 1,000 mg by mouth daily with supper. 05/21/20  Yes [provider]  metoprolol succinate (TOPROL-XL) 50 MG 24 hr tablet Take 100 mg by mouth daily. 07/31/20  Yes [provider]  Multiple Vitamin (MULTIVITAMIN WITH MINERALS) TABS Take 1 tablet by mouth daily.   Yes [provider]  ceFAZolin (ANCEF) 2-4 GM/100ML-% IVPB Inject 100 mLs (2 g total) into the vein every 8 (eight) hours. Patient not taking: Reported on 04/07/2023 06/05/21   Marrion Coy, MD  feeding supplement, GLUCERNA SHAKE, (GLUCERNA SHAKE) LIQD Take 237 mLs by mouth 3 (three) times daily between meals. Patient not taking: Reported on 04/07/2023 05/31/21   Darlin Priestly, MD  insulin aspart (NOVOLOG) 100 UNIT/ML injection Inject  7 Units into the skin 3 (three) times daily with meals. Patient not taking: Reported on 04/07/2023 06/02/21   Darlin Priestly, MD  leflunomide (ARAVA) 20 MG tablet Hold while having active infection. Patient not taking: Reported on 04/07/2023 05/31/21   Darlin Priestly, MD  oxyCODONE (OXY IR/ROXICODONE) 5 MG immediate release tablet Take 1 tablet (5 mg total) by mouth every 4 (four) hours as needed for severe pain or moderate pain. Patient not taking: Reported on 04/07/2023 06/04/21   Darlin Priestly, MD  rifampin (RIFADIN) 300 MG capsule Take 1 capsule (300 mg total) by mouth every 12 (twelve) hours. Patient not taking: Reported on 04/07/2023 06/04/21   Darlin Priestly, MD    Allergies as of 03/26/2023 - Review Complete 06/04/2021  Allergen Reaction Noted   Bactrim [sulfamethoxazole-trimethoprim] Rash 05/29/2021   Pantoprazole Rash 03/07/2019    Family History  Problem Relation Age of Onset   Diabetes Mother    Diabetes Father    Hypertension Father     Social History   Socioeconomic History   Marital status: Legally Separated    Spouse name: Not on file   Number of children: Not on file   Years of education: Not on file   Highest education level: Not on file  Occupational History   Not on file  Tobacco Use   Smoking status: Some Days    Current packs/day: 0.50    Types: Cigarettes   Smokeless tobacco: Never  Vaping Use   Vaping status: Never Used  Substance and Sexual Activity   Alcohol use: Yes    Comment: occ   Drug use: No   Sexual activity: Not on file  Other Topics Concern   Not on file  Social History Narrative   Not on file   Social Determinants of Health   Financial Resource Strain: Low Risk  (04/01/2023)   Received from [facility]   Overall Financial Resource Strain (CARDIA)    Difficulty of Paying Living Expenses: Not hard at all  Food Insecurity: No Food Insecurity (04/01/2023)   Received from [facility]   Hunger Vital Sign    Worried About Running Out of Food in the Last Year: Never true    Ran Out of Food in the Last Year: Never true  Transportation Needs: No Transportation Needs (04/01/2023)   Received from [facility] - Transportation    In the past 12 months, has lack of transportation kept you from medical appointments or from getting medications?: No    Lack of Transportation (Non-Medical): No  Physical Activity: Not on file  Stress: Not on file  Social Connections: Not  on file  Intimate Partner Violence: Not on file    Review of Systems: See HPI, otherwise negative ROS  Physical Exam: Ht 5' (1.524 m)   Wt 66.2 kg   BMI 28.51 kg/m  General:   Alert, cooperative in NAD Head:  Normocephalic and atraumatic. Respiratory:  Normal work of breathing. Cardiovascular:  RRR  Impression/Plan: Chenoa Syler is here for cataract surgery.  Risks, benefits, limitations, and alternatives regarding cataract surgery have been reviewed with the patient.  Questions have been answered.  All parties agreeable.   Estanislado Pandy, MD  04/16/2023, 7:12 AM

## 2023-04-16 NOTE — Anesthesia Preprocedure Evaluation (Signed)
Anesthesia Evaluation  Patient identified by MRN, date of birth, ID band Patient awake    History of Anesthesia Complications (+) Family history of anesthesia reaction and history of anesthetic complications  Airway Mallampati: III  TM Distance: >3 FB Neck ROM: Limited    Dental  (+) Teeth Intact   Pulmonary neg shortness of breath, neg COPD, neg recent URI, Current Smoker and Patient abstained from smoking. +snoring          Cardiovascular Exercise Tolerance: Poor hypertension, (-) angina +CHF  (-) Past MI and (-) Cardiac Stents (-) dysrhythmias (-) Valvular Problems/Murmurs Rhythm:Regular  04/06/19 ECHO: 1. Left ventricular ejection fraction, by visual estimation, is 35 to 40%. The left ventricle has moderately decreased function. Mild to moderately increased left ventricular size. There is no left ventricular hypertrophy. 2. Multiple segmental abnormalities exist.  3. Global right ventricle has mildly reduced systolic function.The right ventricular size is mildly enlarged. No increase in right ventricular wall thickness. 4. Left atrial size was normal. 5. Right atrial size was normal. 6. The mitral valve is grossly normal. No evidence of mitral valve regurgitation. 7. The tricuspid valve is normal in structure. Tricuspid valve regurgitation is mild. 8. The aortic valve is normal in structure. Aortic valve regurgitation was not visualized by color flow Doppler. 9. The pulmonic valve was grossly normal. Pulmonic valve regurgitation is not visualized by color flow Doppler. 10. Normal pulmonary artery systolic pressure.  12/10/19 EKG: SINUS TACHYCARDIA 108 OTHERWISE NORMAL ECG     Neuro/Psych neg Seizures PSYCHIATRIC DISORDERS  Depression     Neuromuscular disease    GI/Hepatic Neg liver ROS,GERD  ,,pancreatitis   Endo/Other  diabetes, Type 2    Renal/GU negative Renal ROS     Musculoskeletal  (+) Arthritis , Rheumatoid  disorders,  S/p THR   Abdominal   Peds  Hematology negative hematology ROS (+)   Anesthesia Other Findings Past Medical History: No date: Arthritis No date: Colon polyps No date: Complication of anesthesia     Comment:  woke up during 1 colonscopy 3 yrs ago No date: Diabetes mellitus without complication (HCC)     Comment:  type 2 No date: DM (diabetes mellitus) (HCC) No date: Family history of adverse reaction to anesthesia     Comment:  mother slow to awaken after 1 procedure No date: GERD (gastroesophageal reflux disease) No date: Hypertension     Comment:  hx of no bp meds for last 11 years after weight loss 3-4 yrs ago and feb 2018: Pancreatitis No date: Presence of artificial hip     Comment:  has to take antibiotics prior to any procedure-dental,               etc No date: Ulcer   Reproductive/Obstetrics                             Anesthesia Physical Anesthesia Plan  ASA: 3  Anesthesia Plan: MAC   Post-op Pain Management:    Induction: Intravenous  PONV Risk Score and Plan: 1 and Midazolam and Treatment may vary due to age or medical condition  Airway Management Planned: Natural Airway and Nasal Cannula  Additional Equipment:   Intra-op Plan:   Post-operative Plan:   Informed Consent: I have reviewed the patients History and Physical, chart, labs and discussed the procedure including the risks, benefits and alternatives for the proposed anesthesia with the patient or authorized representative who has indicated his/her understanding  and acceptance.     Dental advisory given  Plan Discussed with:   Anesthesia Plan Comments: (OSA score 2)        Anesthesia Quick Evaluation

## 2023-04-17 ENCOUNTER — Encounter: Payer: Self-pay | Admitting: Ophthalmology

## 2023-07-29 ENCOUNTER — Emergency Department
Admission: EM | Admit: 2023-07-29 | Discharge: 2023-07-29 | Disposition: A | Discharge status: Home / Self Care | Payer: BC Managed Care – PPO | Attending: Emergency Medicine | Admitting: Emergency Medicine

## 2023-07-29 ENCOUNTER — Other Ambulatory Visit: Payer: Self-pay

## 2023-07-29 DIAGNOSIS — R1012 Left upper quadrant pain: Secondary | ICD-10-CM | POA: Diagnosis not present

## 2023-07-29 DIAGNOSIS — Y92410 Unspecified street and highway as the place of occurrence of the external cause: Secondary | ICD-10-CM | POA: Diagnosis not present

## 2023-07-29 DIAGNOSIS — E119 Type 2 diabetes mellitus without complications: Secondary | ICD-10-CM | POA: Insufficient documentation

## 2023-07-29 DIAGNOSIS — I10 Essential (primary) hypertension: Secondary | ICD-10-CM | POA: Diagnosis not present

## 2023-07-29 DIAGNOSIS — S20212A Contusion of left front wall of thorax, initial encounter: Secondary | ICD-10-CM | POA: Diagnosis not present

## 2023-07-29 DIAGNOSIS — S299XXA Unspecified injury of thorax, initial encounter: Secondary | ICD-10-CM | POA: Diagnosis present

## 2023-07-29 LAB — COMPREHENSIVE METABOLIC PANEL
ALT: 21 U/L (ref 0–44)
AST: 23 U/L (ref 15–41)
Albumin: 4.7 g/dL (ref 3.5–5.0)
Alkaline Phosphatase: 105 U/L (ref 38–126)
Anion gap: 14 (ref 5–15)
BUN: 12 mg/dL (ref 6–20)
CO2: 24 mmol/L (ref 22–32)
Calcium: 9.9 mg/dL (ref 8.9–10.3)
Chloride: 96 mmol/L — ABNORMAL LOW (ref 98–111)
Creatinine, Ser: 0.7 mg/dL (ref 0.44–1.00)
GFR, Estimated: >60 mL/min (ref >60)
Glucose, Bld: 332 mg/dL — ABNORMAL HIGH (ref 70–99)
Potassium: 4.4 mmol/L (ref 3.5–5.1)
Sodium: 134 mmol/L — ABNORMAL LOW (ref 135–145)
Total Bilirubin: 0.7 mg/dL (ref 0.0–1.2)
Total Protein: 9.4 g/dL — ABNORMAL HIGH (ref 6.5–8.1)

## 2023-07-29 LAB — CBC
HCT: 46.3 % — ABNORMAL HIGH (ref 36.0–46.0)
Hemoglobin: 15.3 g/dL — ABNORMAL HIGH (ref 12.0–15.0)
MCH: 27.8 pg (ref 26.0–34.0)
MCHC: 33 g/dL (ref 30.0–36.0)
MCV: 84.2 fL (ref 80.0–100.0)
Platelets: 265 10*3/uL (ref 150–400)
RBC: 5.5 MIL/uL — ABNORMAL HIGH (ref 3.87–5.11)
RDW: 13.6 % (ref 11.5–15.5)
WBC: 9.8 10*3/uL (ref 4.0–10.5)
nRBC: 0 % (ref 0.0–0.2)

## 2023-07-29 LAB — LIPASE, BLOOD: Lipase: 31 U/L (ref 11–51)

## 2023-07-29 MED ORDER — HYDROCODONE-ACETAMINOPHEN 5-325 MG PO TABS: 1.0000 | ORAL_TABLET | Freq: Three times a day (TID) | ORAL | 0 refills | Status: AC | PRN

## 2023-07-29 MED ORDER — CYCLOBENZAPRINE HCL 5 MG PO TABS: 5.0000 mg | ORAL_TABLET | Freq: Three times a day (TID) | ORAL | 0 refills | Status: AC | PRN

## 2023-07-29 MED ORDER — LIDOCAINE 5 % EX PTCH
1.0000 | MEDICATED_PATCH | CUTANEOUS | Status: DC
  Administered 2023-07-29: 1 via TRANSDERMAL
  Filled 2023-07-29: qty 1

## 2023-07-29 MED ORDER — LIDOCAINE 5 % EX PTCH: 1.0000 | MEDICATED_PATCH | Freq: Two times a day (BID) | CUTANEOUS | 0 refills | Status: DC | PRN
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
## 2023-07-29 NOTE — ED Triage Notes (Signed)
 Pt here with left side abd pain from [facility] since Sat. Pt was sent here for evaluation of appendicitis. Pt denies NVD.

## 2023-07-29 NOTE — ED Notes (Signed)
 First nurse note:   Pt here from [facility]  via [facility] pain, tender to touch. HX of pancreatitis and diabetes.

## 2023-07-29 NOTE — Discharge Instructions (Addendum)
 Your exam and labs are normal and reassuring at this time.  No indication of an acute pancreatitis attack on this presentation.  Your symptoms are consistent with left upper chest wall tenderness, aggravated by palpation and movement of your core.  Your vital signs are stable and your labs do not indicate acute inflammation or infection.  You have had no intractable nausea, vomiting, or fevers, which are typically seen with pancreatitis.  Take the prescription meds as directed.  Follow-up with primary provider for ongoing evaluation.  Return to the ED if needed.

## 2023-08-01 ENCOUNTER — Other Ambulatory Visit: Payer: Self-pay

## 2023-08-01 ENCOUNTER — Emergency Department
Admission: EM | Admit: 2023-08-01 | Discharge: 2023-08-01 | Disposition: A | Discharge status: Home / Self Care | Payer: BC Managed Care – PPO | Attending: Emergency Medicine | Admitting: Emergency Medicine

## 2023-08-01 ENCOUNTER — Emergency Department: Payer: BC Managed Care – PPO

## 2023-08-01 ENCOUNTER — Encounter: Payer: Self-pay | Admitting: Intensive Care

## 2023-08-01 DIAGNOSIS — I10 Essential (primary) hypertension: Secondary | ICD-10-CM | POA: Diagnosis not present

## 2023-08-01 DIAGNOSIS — R1012 Left upper quadrant pain: Secondary | ICD-10-CM | POA: Insufficient documentation

## 2023-08-01 DIAGNOSIS — E119 Type 2 diabetes mellitus without complications: Secondary | ICD-10-CM | POA: Insufficient documentation

## 2023-08-01 LAB — COMPREHENSIVE METABOLIC PANEL
ALT: 20 U/L (ref 0–44)
AST: 23 U/L (ref 15–41)
Albumin: 4.6 g/dL (ref 3.5–5.0)
Alkaline Phosphatase: 92 U/L (ref 38–126)
Anion gap: 11 (ref 5–15)
BUN: 24 mg/dL — ABNORMAL HIGH (ref 6–20)
CO2: 24 mmol/L (ref 22–32)
Calcium: 9.5 mg/dL (ref 8.9–10.3)
Chloride: 101 mmol/L (ref 98–111)
Creatinine, Ser: 0.76 mg/dL (ref 0.44–1.00)
GFR, Estimated: >60 mL/min (ref >60)
Glucose, Bld: 269 mg/dL — ABNORMAL HIGH (ref 70–99)
Potassium: 4.2 mmol/L (ref 3.5–5.1)
Sodium: 136 mmol/L (ref 135–145)
Total Bilirubin: 0.5 mg/dL (ref 0.0–1.2)
Total Protein: 8.8 g/dL — ABNORMAL HIGH (ref 6.5–8.1)

## 2023-08-01 LAB — CBC
HCT: 44.3 % (ref 36.0–46.0)
Hemoglobin: 14.1 g/dL (ref 12.0–15.0)
MCH: 27.1 pg (ref 26.0–34.0)
MCHC: 31.8 g/dL (ref 30.0–36.0)
MCV: 85 fL (ref 80.0–100.0)
Platelets: 263 10*3/uL (ref 150–400)
RBC: 5.21 MIL/uL — ABNORMAL HIGH (ref 3.87–5.11)
RDW: 13.7 % (ref 11.5–15.5)
WBC: 11.3 10*3/uL — ABNORMAL HIGH (ref 4.0–10.5)
nRBC: 0 % (ref 0.0–0.2)

## 2023-08-01 LAB — LIPASE, BLOOD: Lipase: 26 U/L (ref 11–51)

## 2023-08-01 MED ORDER — DIPHENHYDRAMINE HCL 50 MG/ML IJ SOLN
12.5000 mg | INTRAMUSCULAR | Status: AC
  Administered 2023-08-01: 12.5 mg via INTRAVENOUS
  Filled 2023-08-01: qty 1

## 2023-08-01 MED ORDER — ONDANSETRON HCL 4 MG/2ML IJ SOLN
4.0000 mg | Freq: Once | INTRAMUSCULAR | Status: AC
  Administered 2023-08-01: 4 mg via INTRAVENOUS
  Filled 2023-08-01: qty 2

## 2023-08-01 MED ORDER — MORPHINE SULFATE (PF) 4 MG/ML IV SOLN
4.0000 mg | Freq: Once | INTRAVENOUS | Status: AC
Start: 2023-08-01 — End: 2023-08-01
  Administered 2023-08-01: 4 mg via INTRAVENOUS
  Filled 2023-08-01: qty 1

## 2023-08-01 MED ORDER — SODIUM CHLORIDE 0.9 % IV BOLUS
1000.0000 mL | Freq: Once | INTRAVENOUS | Status: AC
  Administered 2023-08-01: 1000 mL via INTRAVENOUS

## 2023-08-01 MED ORDER — OXYCODONE-ACETAMINOPHEN 5-325 MG PO TABS: 1.0000 | ORAL_TABLET | Freq: Four times a day (QID) | ORAL | 0 refills | Status: AC | PRN

## 2023-08-01 MED ORDER — IBUPROFEN 800 MG PO TABS: 800.0000 mg | ORAL_TABLET | Freq: Three times a day (TID) | ORAL | 0 refills | Status: AC | PRN

## 2023-08-01 MED ORDER — LIDOCAINE 5 % EX PTCH: 1.0000 | MEDICATED_PATCH | Freq: Two times a day (BID) | CUTANEOUS | 2 refills | Status: AC

## 2023-08-01 MED ORDER — ONDANSETRON 4 MG PO TBDP: 4.0000 mg | ORAL_TABLET | Freq: Three times a day (TID) | ORAL | 0 refills | Status: AC | PRN

## 2023-08-01 MED ORDER — HYDROMORPHONE HCL 1 MG/ML IJ SOLN
1.0000 mg | Freq: Once | INTRAMUSCULAR | Status: AC
  Administered 2023-08-01: 1 mg via INTRAVENOUS
  Filled 2023-08-01: qty 1

## 2023-08-01 MED ORDER — IOHEXOL 300 MG/ML  SOLN
100.0000 mL | Freq: Once | INTRAMUSCULAR | Status: AC | PRN
  Administered 2023-08-01: 100 mL via INTRAVENOUS
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
## 2023-08-01 NOTE — ED Triage Notes (Signed)
 First nurse note: Brought over from [facility]. LUQ pain. Seen for same on Wednesday and reports pain is worse. MD sent over for further imaging.   Has not had b/p meds today  Denies headache or blurred vision per [facility] vitals: 145/98 b/p 102HR 97% RA 98.0 oral
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
## 2023-08-18 ENCOUNTER — Other Ambulatory Visit: Payer: Self-pay | Admitting: Physician Assistant

## 2023-08-18 DIAGNOSIS — J90 Pleural effusion, not elsewhere classified: Secondary | ICD-10-CM

## 2023-09-01 ENCOUNTER — Ambulatory Visit
Admission: RE | Admit: 2023-09-01 | Discharge: 2023-09-01 | Disposition: A | Discharge status: Home / Self Care | Payer: BC Managed Care – PPO | Source: Ambulatory Visit | Attending: Physician Assistant | Admitting: Physician Assistant

## 2023-09-01 DIAGNOSIS — J90 Pleural effusion, not elsewhere classified: Secondary | ICD-10-CM

## 2024-02-12 ENCOUNTER — Other Ambulatory Visit: Payer: Self-pay | Admitting: Physician Assistant

## 2024-02-12 DIAGNOSIS — Z1231 Encounter for screening mammogram for malignant neoplasm of breast: Secondary | ICD-10-CM

## 2024-03-07 ENCOUNTER — Ambulatory Visit
Admission: RE | Admit: 2024-03-07 | Discharge: 2024-03-07 | Disposition: A | Discharge status: Home / Self Care | Source: Ambulatory Visit | Attending: Physician Assistant | Admitting: Physician Assistant

## 2024-03-07 DIAGNOSIS — Z1231 Encounter for screening mammogram for malignant neoplasm of breast: Secondary | ICD-10-CM | POA: Diagnosis present

## 2024-04-14 ENCOUNTER — Ambulatory Visit: Attending: Neurology

## 2024-04-14 DIAGNOSIS — R2689 Other abnormalities of gait and mobility: Secondary | ICD-10-CM | POA: Insufficient documentation

## 2024-04-14 DIAGNOSIS — R262 Difficulty in walking, not elsewhere classified: Secondary | ICD-10-CM | POA: Insufficient documentation

## 2024-04-14 DIAGNOSIS — Z9181 History of falling: Secondary | ICD-10-CM | POA: Insufficient documentation

## 2024-04-14 DIAGNOSIS — R2681 Unsteadiness on feet: Secondary | ICD-10-CM | POA: Insufficient documentation

## 2024-04-14 DIAGNOSIS — M6281 Muscle weakness (generalized): Secondary | ICD-10-CM | POA: Insufficient documentation

## 2024-04-14 NOTE — Therapy (Incomplete)
 OUTPATIENT PHYSICAL THERAPY EVALUATION   Patient Name: April Chaney MRN: [id number] DOB:[birth year], 53 y.o., female Today's Date: 04/14/2024  PCP: [name], PA  REFERRING PROVIDER: Lane Arthea BRAVO, MD  END OF SESSION:   Past Medical History:  Diagnosis Date   Arthritis    Colon polyps    Complication of anesthesia    woke up during 1 colonscopy 3 yrs ago   Diabetes mellitus without complication (HCC)    type 2   DM (diabetes mellitus) (HCC)    Family history of adverse reaction to anesthesia    mother slow to awaken after 1 procedure   GERD (gastroesophageal reflux disease)    Hypertension    hx of no bp meds for last 11 years after weight loss   Pancreatitis 3-4 yrs ago and feb 2018   Presence of artificial hip    has to take antibiotics prior to any procedure-dental, etc   Ulcer    Past Surgical History:  Procedure Laterality Date   CATARACT EXTRACTION W/PHACO Left 04/16/2023   Procedure: CATARACT EXTRACTION PHACO AND INTRAOCULAR LENS PLACEMENT (IOC) LEFT DIABETIC 7.53 00:42.8;  Surgeon: Enola Feliciano Hugger, MD;  Location: [facility];  Service: Ophthalmology;  Laterality: Left;   COLONOSCOPY WITH PROPOFOL  N/A 02/11/2019   Procedure: COLONOSCOPY WITH PROPOFOL ;  Surgeon: Gaylyn Gladis PENNER, MD;  Location: [facility] END[facility];  Service: Endoscopy;  Laterality: N/A;   colonscopy     x 4   ESOPHAGOGASTRODUODENOSCOPY (EGD) WITH PROPOFOL  N/A 02/11/2019   Procedure: ESOPHAGOGASTRODUODENOSCOPY (EGD) WITH PROPOFOL ;  Surgeon: Gaylyn Gladis PENNER, MD;  Location: [facility] ENDOSCOPY;  Service: Endoscopy;  Laterality: N/A;   EUS N/A 01/01/2017   Procedure: UPPER ENDOSCOPIC ULTRASOUND (EUS) LINEAR;  Surgeon: Teressa Toribio SQUIBB, MD;  Location: [facility] END[facility]COPY;  Service: Endoscopy;  Laterality: N/A;   FINE NEEDLE ASPIRATION  01/01/2017   Procedure: FINE NEEDLE ASPIRATION;  Surgeon: Teressa Toribio SQUIBB, MD;  Location: [facility] ENDOSCOPY;  Service: Endoscopy;;   INCISION AND  DRAINAGE ABSCESS N/A 12/29/2020   Procedure: INCISION AND DRAINAGE ABSCESS;  Surgeon: Rodolph Romano, MD;  Location: [facility] ORS;  Service: General;  Laterality: N/A;   JOINT REPLACEMENT     hip replacement   LEFT HEART CATH AND CORONARY ANGIOGRAPHY N/A 04/26/2019   Procedure: LEFT HEART CATH AND CORONARY ANGIOGRAPHY;  Surgeon: Ammon Blunt, MD;  Location: [facility] IN[facility] CV [facility];  Service: Cardiovascular;  Laterality: N/A;   STRABISMUS SURGERY  12/12/2011   Procedure: REPAIR STRABISMUS;  Surgeon: Elsie MALVA Salt, MD;  Location: [facility];  Service: Ophthalmology;  Laterality: Right;   TOTAL HIP ARTHROPLASTY     bilat   Patient Active Problem List   Diagnosis Date Noted   Severe sepsis (HCC) 06/05/2021   Abscess of left thigh 05/27/2021   Abscess 12/27/2020   Acute CHF (congestive heart failure) (HCC) 04/05/2019   Epigastric pain    Alcohol-induced acute pancreatitis    Abdominal pain 08/03/2016   Encounter for health maintenance examination 10/22/2015   Rheumatoid arthritis involving multiple sites with positive rheumatoid factor (HCC) 10/22/2015   BMI 35.0-35.9,adult 09/23/2015   Elevated rheumatoid factor 09/23/2015   History of adenomatous polyp of colon 09/23/2015   Alopecia 08/18/2015   Benign essential hypertension 08/18/2015   Benign neoplasm of colon 08/18/2015   Epigastric discomfort 08/18/2015   GERD without esophagitis 08/18/2015   Heartburn 08/18/2015   High risk medication use 08/18/2015   Hyperlipidemia 08/18/2015   Hyponatremia 08/18/2015   Infective otitis externa of left ear  08/18/2015   Insomnia 08/18/2015   Neuropathy of both feet 08/18/2015   Nicotine  dependence 08/18/2015   Oligomenorrhea 08/18/2015   Obesity 08/18/2015   Gastro-esophageal reflux disease with esophagitis 08/18/2015   Stress incontinence in female 08/18/2015   Type 2 diabetes, uncontrolled, with neuropathy 08/18/2015   Uncontrolled diabetes mellitus with  complications 08/18/2015   Vitamin D  deficiency 08/18/2015   Wheezing 08/18/2015   Colon polyps 01/06/2014   Pancreatic mass 01/06/2014   Pancreatitis 01/06/2014    ONSET DATE:   REFERRING DIAG: Frequent falls, unsteadiness, severe hip pain   THERAPY DIAG:  No diagnosis found.  Rationale for Evaluation and Treatment: Rehabilitation  SUBJECTIVE:                                                                                                                                                                                             SUBJECTIVE STATEMENT:  Pt accompanied by:   PERTINENT HISTORY:   53yoF referred to [facility] via neurology for frequent falls, imbalance, severe hip pain. Pt recommended to see physiatry for pain management of hip, but declined at recent office visit.   PAIN:  Are you having pain? {OPRCPAIN:27236}  PRECAUTIONS: Fall  WEIGHT BEARING RESTRICTIONS: No  FALLS: Has patient fallen in last 6 months?   LIVING ENVIRONMENT: Lives with: {OPRC lives with:25569::lives with their family} Lives in: {Lives in:25570} Stairs: {opstairs:27293} Has following equipment at home: {Assistive devices:23999}  PLOF: {PLOF:24004}  PATIENT GOALS: ***  OBJECTIVE:  Note: Objective measures were completed at Evaluation unless otherwise noted.  DIAGNOSTIC FINDINGS: ***  COGNITION: Overall cognitive status: {cognition:24006}   SENSATION: {sensation:27233}  COORDINATION: ***  EDEMA:  {edema:24020}  MUSCLE TONE: {LE tone:25568}  MUSCLE LENGTH: Hamstrings: Right *** deg; Left *** deg Debby test: Right *** deg; Left *** deg  DTRs:  {DTR SITE:24025}  POSTURE: {posture:25561}  LOWER EXTREMITY ROM:     {AROM/PROM:27142}  Right Eval Left Eval  Hip flexion    Hip extension    Hip abduction    Hip adduction    Hip internal rotation    Hip external rotation    Knee flexion    Knee extension    Ankle dorsiflexion    Ankle plantarflexion    Ankle  inversion    Ankle eversion     (Blank rows = not tested)  LOWER EXTREMITY MMT:    MMT Right Eval Left Eval  Hip flexion    Hip extension    Hip abduction    Hip adduction    Hip internal rotation    Hip external rotation    Knee flexion  Knee extension    Ankle dorsiflexion    Ankle plantarflexion    Ankle inversion    Ankle eversion    (Blank rows = not tested)   FUNCTIONAL TESTS:  {Functional tests:24029}  PATIENT SURVEYS:  {rehab surveys:24030}                                                                                                                              TREATMENT DATE: ***    PATIENT EDUCATION: Education details: *** Person educated: {Person educated:25204} Education method: {Education Method:25205} Education comprehension: {Education Comprehension:25206}  HOME EXERCISE PROGRAM: ***  GOALS: Goals reviewed with patient? {yes/no:20286}  SHORT TERM GOALS: Target date: ***  *** Baseline: Goal status: INITIAL  2.  *** Baseline:  Goal status: INITIAL  3.  *** Baseline:  Goal status: INITIAL  4.  *** Baseline:  Goal status: INITIAL  5.  *** Baseline:  Goal status: INITIAL  6.  *** Baseline:  Goal status: INITIAL  LONG TERM GOALS: Target date: ***  *** Baseline:  Goal status: INITIAL  2.  *** Baseline:  Goal status: INITIAL  3.  *** Baseline:  Goal status: INITIAL  4.  *** Baseline:  Goal status: INITIAL  5.  *** Baseline:  Goal status: INITIAL  6.  *** Baseline:  Goal status: INITIAL  ASSESSMENT:  CLINICAL IMPRESSION: Patient is a *** y.o. *** who was seen today for physical therapy evaluation and treatment for ***.   OBJECTIVE IMPAIRMENTS: {opptimpairments:25111}.   ACTIVITY LIMITATIONS: {activitylimitations:27494}  PARTICIPATION LIMITATIONS: {participationrestrictions:25113}  PERSONAL FACTORS: {Personal factors:25162} are also affecting patient's functional outcome.   REHAB POTENTIAL:  {rehabpotential:25112}  CLINICAL DECISION MAKING: {clinical decision making:25114}  EVALUATION COMPLEXITY: {Evaluation complexity:25115}  PLAN:  PT FREQUENCY: {rehab frequency:25116}  PT DURATION: {rehab duration:25117}  PLANNED INTERVENTIONS: {rehab planned interventions:25118::97110-Therapeutic exercises,97530- Therapeutic [phone]- Neuromuscular re-education,97535- Self Rjmz,02859- Manual therapy,Patient/Family education}  PLAN FOR NEXT SESSION: ***   Vannessa Godown C, PT 04/14/2024, 3:27 PM

## 2024-04-19 NOTE — Therapy (Incomplete)
 OUTPATIENT PHYSICAL THERAPY NEURO EVALUATION   Patient Name: April Chaney MRN: [id number] DOB:May 29, 1971, 53 y.o., female Today's Date: 04/19/2024   PCP: [name], PA REFERRING PROVIDER: Lane Arthea BRAVO, MD  END OF SESSION:   Past Medical History:  Diagnosis Date   Arthritis    Colon polyps    Complication of anesthesia    woke up during 1 colonscopy 3 yrs ago   Diabetes mellitus without complication (HCC)    type 2   DM (diabetes mellitus) (HCC)    Family history of adverse reaction to anesthesia    mother slow to awaken after 1 procedure   GERD (gastroesophageal reflux disease)    Hypertension    hx of no bp meds for last 11 years after weight loss   Pancreatitis 3-4 yrs ago and feb 2018   Presence of artificial hip    has to take antibiotics prior to any procedure-dental, etc   Ulcer    Past Surgical History:  Procedure Laterality Date   CATARACT EXTRACTION W/PHACO Left 04/16/2023   Procedure: CATARACT EXTRACTION PHACO AND INTRAOCULAR LENS PLACEMENT (IOC) LEFT DIABETIC 7.53 00:42.8;  Surgeon: Enola Feliciano Hugger, MD;  Location: [facility];  Service: Ophthalmology;  Laterality: Left;   COLONOSCOPY WITH PROPOFOL  N/A 02/11/2019   Procedure: COLONOSCOPY WITH PROPOFOL ;  Surgeon: Gaylyn Gladis PENNER, MD;  Location: [facility];  Service: Endoscopy;  Laterality: N/A;   colonscopy     x 4   ESOPHAGOGASTRODUODENOSCOPY (EGD) WITH PROPOFOL  N/A 02/11/2019   Procedure: ESOPHAGOGASTRODUODENOSCOPY (EGD) WITH PROPOFOL ;  Surgeon: Gaylyn Gladis PENNER, MD;  Location: [facility] END[facility];  Service: Endoscopy;  Laterality: N/A;   EUS N/A 01/01/2017   Procedure: UPPER ENDOSCOPIC ULTRASOUND (EUS) LINEAR;  Surgeon: Teressa Toribio SQUIBB, MD;  Location: [facility];  Service: Endoscopy;  Laterality: N/A;   FINE NEEDLE ASPIRATION  01/01/2017   Procedure: FINE NEEDLE ASPIRATION;  Surgeon: Teressa Toribio SQUIBB, MD;  Location: [facility] END[facility];  Service: Endoscopy;;   INCISION AND  DRAINAGE ABSCESS N/A 12/29/2020   Procedure: INCISION AND DRAINAGE ABSCESS;  Surgeon: Rodolph Romano, MD;  Location: [facility];  Service: General;  Laterality: N/A;   JOINT REPLACEMENT     hip replacement   LEFT HEART CATH AND CORONARY ANGIOGRAPHY N/A 04/26/2019   Procedure: LEFT HEART CATH AND CORONARY ANGIOGRAPHY;  Surgeon: Ammon Blunt, MD;  Location: [facility] CV [facility];  Service: Cardiovascular;  Laterality: N/A;   STRABISMUS SURGERY  12/12/2011   Procedure: REPAIR STRABISMUS;  Surgeon: Elsie MALVA Salt, MD;  Location: [facility];  Service: Ophthalmology;  Laterality: Right;   TOTAL HIP ARTHROPLASTY     bilat   Patient Active Problem List   Diagnosis Date Noted   Severe sepsis (HCC) 06/05/2021   Abscess of left thigh 05/27/2021   Abscess 12/27/2020   Acute CHF (congestive heart failure) (HCC) 04/05/2019   Epigastric pain    Alcohol-induced acute pancreatitis    Abdominal pain 08/03/2016   Encounter for health maintenance examination 10/22/2015   Rheumatoid arthritis involving multiple sites with positive rheumatoid factor (HCC) 10/22/2015   BMI 35.0-35.9,adult 09/23/2015   Elevated rheumatoid factor 09/23/2015   History of adenomatous polyp of colon 09/23/2015   Alopecia 08/18/2015   Benign essential hypertension 08/18/2015   Benign neoplasm of colon 08/18/2015   Epigastric discomfort 08/18/2015   GERD without esophagitis 08/18/2015   Heartburn 08/18/2015   High risk medication use 08/18/2015   Hyperlipidemia 08/18/2015   Hyponatremia 08/18/2015   Infective otitis externa of left  ear 08/18/2015   Insomnia 08/18/2015   Neuropathy of both feet 08/18/2015   Nicotine  dependence 08/18/2015   Oligomenorrhea 08/18/2015   Obesity 08/18/2015   Gastro-esophageal reflux disease with esophagitis 08/18/2015   Stress incontinence in female 08/18/2015   Type 2 diabetes, uncontrolled, with neuropathy 08/18/2015   Uncontrolled diabetes mellitus with  complications 08/18/2015   Vitamin D  deficiency 08/18/2015   Wheezing 08/18/2015   Colon polyps 01/06/2014   Pancreatic mass 01/06/2014   Pancreatitis 01/06/2014    ONSET DATE: ***  REFERRING DIAG: R26.89 (ICD-10-CM) - Imbalance  THERAPY DIAG:  No diagnosis found.  Rationale for Evaluation and Treatment: Rehabilitation  SUBJECTIVE:                                                                                                                                                                                             SUBJECTIVE STATEMENT: *** Pt accompanied by: {accompnied:27141}  PERTINENT HISTORY:  53 y/o female with history of multiple falls over the past year. She has significant L hip pain. Patient had recent fall after passing out (likely due to low blood sugar per PCP note from 04/04/24) and hitting her head causing bruising to her eye.  PMH: T2DM, alcohol abuse, prior CVA, depression   PAIN:  Are you having pain? {OPRCPAIN:27236}  PRECAUTIONS: Fall  RED FLAGS: {PT Red Flags:29287}   WEIGHT BEARING RESTRICTIONS: No  FALLS: Has patient fallen in last 6 months? Yes. Number of falls ***  LIVING ENVIRONMENT: Lives with: {OPRC lives with:25569::lives with their family} Lives in: {Lives in:25570} Stairs: {opstairs:27293} Has following equipment at home: {Assistive devices:23999}  PLOF: {PLOF:24004}  PATIENT GOALS: ***  OBJECTIVE:  Note: Objective measures were completed at Evaluation unless otherwise noted.  DIAGNOSTIC FINDINGS: No recent imaging  COGNITION: Overall cognitive status: {cognition:24006}   SENSATION: {sensation:27233}  COORDINATION: ***  EDEMA:  {edema:24020}  MUSCLE TONE: {LE tone:25568}  MUSCLE LENGTH: Hamstrings: Right *** deg; Left *** deg Debby test: Right *** deg; Left *** deg  DTRs:  {DTR SITE:24025}  POSTURE: {posture:25561}  LOWER EXTREMITY ROM:     {AROM/PROM:27142}  Right Eval Left Eval  Hip flexion    Hip  extension    Hip abduction    Hip adduction    Hip internal rotation    Hip external rotation    Knee flexion    Knee extension    Ankle dorsiflexion    Ankle plantarflexion    Ankle inversion    Ankle eversion     (Blank rows = not tested)  LOWER EXTREMITY MMT:    MMT Right Eval Left Eval  Hip flexion  Hip extension    Hip abduction    Hip adduction    Hip internal rotation    Hip external rotation    Knee flexion    Knee extension    Ankle dorsiflexion    Ankle plantarflexion    Ankle inversion    Ankle eversion    (Blank rows = not tested)  BED MOBILITY:  {bed mobility:32615:p}  TRANSFERS: {transfers eval:32620}  RAMP:  {ramp eval:32616}  CURB:  {curb eval:32617}  STAIRS: {stairs eval:32618} GAIT: Findings: {GaitneuroPT:32644::Distance walked: ***,Comments: ***}  FUNCTIONAL TESTS:  {Functional tests:24029}  PATIENT SURVEYS:  ABC scale: The Activities-Specific Balance Confidence (ABC) Scale 0% 10 20 30  40 50 60 70 80 90 100% No confidence<->completely confident  "How confident are you that you will not lose your balance or become unsteady when you . . .   Date tested ***  Walk around the house ***%  2. Walk up or down stairs ***%  3. Bend over and pick up a slipper from in front of a closet floor ***%  4. Reach for a small can off a shelf at eye level ***%  5. Stand on tip toes and reach for something above your head ***%  6. Stand on a chair and reach for something ***%  7. Sweep the floor ***%  8. Walk outside the house to a car parked in the driveway ***%  9. Get into or out of a car ***%  10. Walk across a parking lot to the mall ***%  11. Walk up or down a ramp ***%  12. Walk in a crowded mall where people rapidly walk past you ***%  13. Are bumped into by people as you walk through the mall ***%  14. Step onto or off of an escalator while you are holding onto the railing ***%  15. Step onto or off an escalator while holding onto  parcels such that you cannot hold onto the railing ***%  16. Walk outside on icy sidewalks ***%  Total: #/16 ***                                                                                                                                 TREATMENT DATE: 04/19/24    PATIENT EDUCATION: Education details: HEP, POC, goals  Person educated: Patient Education method: Explanation, Demonstration, and Handouts Education comprehension: verbalized understanding and returned demonstration  HOME EXERCISE PROGRAM: ***  GOALS: Goals reviewed with patient? Yes  SHORT TERM GOALS: Target date: 06/01/2024  *** Baseline: Goal status: INITIAL  2.  *** Baseline:  Goal status: INITIAL  3.  *** Baseline:  Goal status: INITIAL  4.  *** Baseline:  Goal status: INITIAL  5.  *** Baseline:  Goal status: INITIAL  6.  *** Baseline:  Goal status: INITIAL  LONG TERM GOALS: Target date: 07/13/2024  *** Baseline:  Goal status: INITIAL  2.  *** Baseline:  Goal status: INITIAL  3.  *** Baseline:  Goal status: INITIAL  4.  *** Baseline:  Goal status: INITIAL  5.  *** Baseline:  Goal status: INITIAL  6.  *** Baseline:  Goal status: INITIAL  ASSESSMENT:  CLINICAL IMPRESSION: Patient is a 53 y.o. female who was seen today for physical therapy evaluation and treatment for ***.   OBJECTIVE IMPAIRMENTS: {opptimpairments:25111}.   ACTIVITY LIMITATIONS: {activitylimitations:27494}  PARTICIPATION LIMITATIONS: {participationrestrictions:25113}  PERSONAL FACTORS: {Personal factors:25162} are also affecting patient's functional outcome.   REHAB POTENTIAL: {rehabpotential:25112}  CLINICAL DECISION MAKING: {clinical decision making:25114}  EVALUATION COMPLEXITY: {Evaluation complexity:25115}  PLAN:  PT FREQUENCY: 1-2x/week  PT DURATION: 12 weeks  PLANNED INTERVENTIONS: {rehab planned interventions:25118::97110-Therapeutic exercises,97530- Therapeutic  [phone]- Neuromuscular re-education,97535- Self Rjmz,02859- Manual therapy,Patient/Family education}  PLAN FOR NEXT SESSION: ***   Maryanne Finder, PT, DPT Physical Therapist - [place]  [facility] 04/19/2024, 3:36 PM

## 2024-04-20 ENCOUNTER — Ambulatory Visit: Admitting: Physical Therapy

## 2024-04-20 ENCOUNTER — Ambulatory Visit

## 2024-04-20 DIAGNOSIS — M6281 Muscle weakness (generalized): Secondary | ICD-10-CM | POA: Diagnosis present

## 2024-04-20 DIAGNOSIS — R2681 Unsteadiness on feet: Secondary | ICD-10-CM | POA: Diagnosis present

## 2024-04-20 DIAGNOSIS — Z9181 History of falling: Secondary | ICD-10-CM

## 2024-04-20 DIAGNOSIS — R2689 Other abnormalities of gait and mobility: Secondary | ICD-10-CM | POA: Diagnosis present

## 2024-04-20 DIAGNOSIS — R262 Difficulty in walking, not elsewhere classified: Secondary | ICD-10-CM | POA: Diagnosis present

## 2024-04-20 NOTE — Therapy (Signed)
 OUTPATIENT PHYSICAL THERAPY NEURO EVALUATION   Patient Name: April Chaney MRN: [id number] DOB:11-14-1970, 53 y.o., female Today's Date: 04/20/2024   PCP: [name], PA REFERRING PROVIDER: Lane Arthea BRAVO, MD  END OF SESSION:  PT End of Session - 04/20/24 1553     Visit Number 1    Number of Visits 24    Date for Recertification  07/13/24    PT Start Time 1405    PT Stop Time 1445    PT Time Calculation (min) 40 min    Equipment Utilized During Treatment Gait belt    Activity Tolerance Patient tolerated treatment well    Behavior During Therapy WFL for tasks assessed/performed          Past Medical History:  Diagnosis Date   Arthritis    Colon polyps    Complication of anesthesia    woke up during 1 colonscopy 3 yrs ago   Diabetes mellitus without complication (HCC)    type 2   DM (diabetes mellitus) (HCC)    Family history of adverse reaction to anesthesia    mother slow to awaken after 1 procedure   GERD (gastroesophageal reflux disease)    Hypertension    hx of no bp meds for last 11 years after weight loss   Pancreatitis 3-4 yrs ago and feb 2018   Presence of artificial hip    has to take antibiotics prior to any procedure-dental, etc   Ulcer    Past Surgical History:  Procedure Laterality Date   CATARACT EXTRACTION W/PHACO Left 04/16/2023   Procedure: CATARACT EXTRACTION PHACO AND INTRAOCULAR LENS PLACEMENT (IOC) LEFT DIABETIC 7.53 00:42.8;  Surgeon: Enola Feliciano Hugger, MD;  Location: [facility];  Service: Ophthalmology;  Laterality: Left;   COLONOSCOPY WITH PROPOFOL  N/A 02/11/2019   Procedure: COLONOSCOPY WITH PROPOFOL ;  Surgeon: Gaylyn Gladis PENNER, MD;  Location: [facility] ENDOSCOPY;  Service: Endoscopy;  Laterality: N/A;   colonscopy     x 4   ESOPHAGOGASTRODUODENOSCOPY (EGD) WITH PROPOFOL  N/A 02/11/2019   Procedure: ESOPHAGOGASTRODUODENOSCOPY (EGD) WITH PROPOFOL ;  Surgeon: Gaylyn Gladis PENNER, MD;  Location: [facility] ENDOSCOPY;   Service: Endoscopy;  Laterality: N/A;   EUS N/A 01/01/2017   Procedure: UPPER ENDOSCOPIC ULTRASOUND (EUS) LINEAR;  Surgeon: Teressa Toribio SQUIBB, MD;  Location: [facility] ENDOSCOPY;  Service: Endoscopy;  Laterality: N/A;   FINE NEEDLE ASPIRATION  01/01/2017   Procedure: FINE NEEDLE ASPIRATION;  Surgeon: Teressa Toribio SQUIBB, MD;  Location: [facility] ENDOSCOPY;  Service: Endoscopy;;   INCISION AND DRAINAGE ABSCESS N/A 12/29/2020   Procedure: INCISION AND DRAINAGE ABSCESS;  Surgeon: Rodolph Romano, MD;  Location: [facility] ORS;  Service: General;  Laterality: N/A;   JOINT REPLACEMENT     hip replacement   LEFT HEART CATH AND CORONARY ANGIOGRAPHY N/A 04/26/2019   Procedure: LEFT HEART CATH AND CORONARY ANGIOGRAPHY;  Surgeon: Ammon Blunt, MD;  Location: [facility] INVASIVE CV LAB;  Service: Cardiovascular;  Laterality: N/A;   STRABISMUS SURGERY  12/12/2011   Procedure: REPAIR STRABISMUS;  Surgeon: Elsie MALVA Salt, MD;  Location: [facility];  Service: Ophthalmology;  Laterality: Right;   TOTAL HIP ARTHROPLASTY     bilat   Patient Active Problem List   Diagnosis Date Noted   Severe sepsis (HCC) 06/05/2021   Abscess of left thigh 05/27/2021   Abscess 12/27/2020   Acute CHF (congestive heart failure) (HCC) 04/05/2019   Epigastric pain    Alcohol-induced acute pancreatitis    Abdominal pain 08/03/2016   Encounter for health maintenance examination  10/22/2015   Rheumatoid arthritis involving multiple sites with positive rheumatoid factor (HCC) 10/22/2015   BMI 35.0-35.9,adult 09/23/2015   Elevated rheumatoid factor 09/23/2015   History of adenomatous polyp of colon 09/23/2015   Alopecia 08/18/2015   Benign essential hypertension 08/18/2015   Benign neoplasm of colon 08/18/2015   Epigastric discomfort 08/18/2015   GERD without esophagitis 08/18/2015   Heartburn 08/18/2015   High risk medication use 08/18/2015   Hyperlipidemia 08/18/2015   Hyponatremia 08/18/2015   Infective otitis externa of left  ear 08/18/2015   Insomnia 08/18/2015   Neuropathy of both feet 08/18/2015   Nicotine  dependence 08/18/2015   Oligomenorrhea 08/18/2015   Obesity 08/18/2015   Gastro-esophageal reflux disease with esophagitis 08/18/2015   Stress incontinence in female 08/18/2015   Type 2 diabetes, uncontrolled, with neuropathy 08/18/2015   Uncontrolled diabetes mellitus with complications 08/18/2015   Vitamin D  deficiency 08/18/2015   Wheezing 08/18/2015   Colon polyps 01/06/2014   Pancreatic mass 01/06/2014   Pancreatitis 01/06/2014    ONSET DATE: since hip replacement, L side re-done in 2023  REFERRING DIAG: R26.89 (ICD-10-CM) - Imbalance  THERAPY DIAG:  Difficulty in walking, not elsewhere classified  Muscle weakness (generalized)  Unsteadiness on feet  Other abnormalities of gait and mobility  History of falling  Rationale for Evaluation and Treatment: Rehabilitation  SUBJECTIVE:                                                                                                                                                                                             SUBJECTIVE STATEMENT: Pt stating overall imbalance since last L hip replacement.   Pt stating that she always uses cart in store to have something to hold onto for stability. Pt stating that she uses a cane occasionally, but keeps one with her (in car, around house, etc.) just in case she may need it. Pt reporting that she has Good days & bad days related to imbalance, reporting that she feels more imbalanced when her L hip is painful.   Pt reporting that she had to have bilateral hip replacements at an early age (in 2010, early 76's) due to having Pediatric bones in my body. Pt reporting also having broken ankle in 2022.   Pt stating that she was hospitalized from July-October in 2022; was hospitalized in [facility] because she lost her mind for 3 months - likely due to infection in L hip. During this period, she was in rehab  center for 2 months receiving IV antibiotics. Two months after returning home, had 2nd hip replacement in L.   Of note, pt reporting that prior to  hip replacements, had gone to PT & had a therapist that she felt like pushed her too hard & did not make any progress.   Pt stating that she had gone to orthopedic surgeon, who then referred her to her original surgeon, now refusing to do surgery.   Pt accompanied by: self  PERTINENT HISTORY:  53 y/o female with history of multiple falls over the past year. She has significant L hip pain. Patient had recent fall after passing out (likely due to low blood sugar per PCP note from 04/04/24) and hitting her head causing bruising to her eye.  PMH: T2DM, previous history of heavy alcohol use, prior CVA, depression, RA  Per neurology note 03/29/24: 04/01/2023 CT HEAD WO IMPRESSION:  1. No evidence of acute intracranial abnormality.  2. 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    PAIN:  Are you having pain? No; L side hip pain occasionally  PRECAUTIONS: Fall  RED FLAGS: None   WEIGHT BEARING RESTRICTIONS: No  FALLS: Has patient fallen in last 6 months? Yes. Number of falls 1, tripped over carpet about 2 weeks. Hit head, had some bruising/carpet burn.   LIVING ENVIRONMENT: Lives with: lives alone Lives in: House/apartment Stairs: Yes: Internal: ~12 steps; on left going up; pt reporting only guest bedroom upstairs, never using upstairs portion of house.  Has following equipment at home: Single point cane and shower chair  PLOF: Independent  PATIENT GOALS: feel more balanced   OBJECTIVE:  Note: Objective measures were completed at Evaluation unless otherwise noted.  DIAGNOSTIC FINDINGS: No recent imaging  COGNITION: Overall cognitive status: Within functional limits for tasks assessed   SENSATION: Per neurology note 03/29/24: Decreased sensation  to all modalities in a length dependent gradient in the lower extremities. Otherwise, sensation is within normal limits to all modalities throughout.    COORDINATION: Per neurology note 03/29/24: Finger to nose testing is within normal limits.    POSTURE: rounded shoulders and forward head  LOWER EXTREMITY ROM:     Active  Right Eval Left Eval  Hip flexion    Hip extension    Hip abduction    Hip adduction    Hip internal rotation    Hip external rotation    Knee flexion    Knee extension    Ankle dorsiflexion    Ankle plantarflexion    Ankle inversion    Ankle eversion     (Blank rows = not tested)  LOWER EXTREMITY MMT:    MMT Right Eval Left Eval  Hip flexion    Hip extension    Hip abduction    Hip adduction    Hip internal rotation    Hip external rotation    Knee flexion    Knee extension    Ankle dorsiflexion    Ankle plantarflexion    Ankle inversion    Ankle eversion    (Blank rows = not tested)  BED MOBILITY:  Not tested  TRANSFERS: Sit to stand: SBA  Assistive device utilized: extra time required     Stand to sit: SBA  Assistive device utilized: extra time required      RAMP:  Not tested  CURB:  Not tested  STAIRS: Not tested GAIT: Findings: Gait Characteristics: decreased speed, step through pattern, decreased stride length, and wide BOS, Distance walked: distance needed for gait into clinic & physical performance measures, Assistive device utilized:None, and Level of assistance: CGA  FUNCTIONAL TESTS:  5 times sit to stand: 26.89 10 meter walk test: avg normal: 0.49 m/s, avg fast: 0.61 m/s with no AD, CGA  PATIENT SURVEYS:  ABC scale:   The Activities-Specific Balance Confidence (ABC) Scale 0% 10 20 30  40 50 60 70 80 90 100% No confidence<->completely confident  "How confident are you that you will not lose your balance or become unsteady when you . . .    Date tested 04/20/2024  Walk around the house 70%  2. Walk up or down  stairs 50%  3. Bend over and pick up a slipper from in front of a closet floor 40%  4. Reach for a small can off a shelf at eye level 70%  5. Stand on tip toes and reach for something above your head 70%  6. Stand on a chair and reach for something 0%  7. Sweep the floor 70%  8. Walk outside the house to a car parked in the driveway 70%  9. Get into or out of a car 60%  10. Walk across a parking lot to the mall 70%  11. Walk up or down a ramp 50%  12. Walk in a crowded mall where people rapidly walk past you 70%  13. Are bumped into by people as you walk through the mall 60%  14. Step onto or off of an escalator while you are holding onto the railing 50%  15. Step onto or off an escalator while holding onto parcels such that you cannot hold onto the railing 30%  16. Walk outside on icy sidewalks 50%  Total: #/16 55%                                                                                                                                 TREATMENT DATE: 04/20/24  PT initial evaluation: gathered extensive subjective history & completed ABC scale as noted above. Also completed physical performance measures as noted below.   10 Meter Walk Test: Patient instructed to walk 10 meters (32.8 ft) as quickly and as safely as possible at their normal speed x2 and at a fast speed x2. Time measured from 2 meter mark to 8 meter mark to accommodate ramp-up and ramp-down.  Normal speed 1: 0.44 m/s (22.60 seconds) Normal speed 2: 0.53 m/s (18.90 seconds) Average Normal speed: 0.49 m/s with CGA, no AD Fast speed 1: 0.61 m/s (16.41 seconds) Fast speed 2: 0.61 m/s (16.52 seconds) Average Fast speed: 0.61 m/s with CGA, no AD Cut off scores: <0.4 m/s = household Ambulator, 0.4-0.8 m/s = limited community Ambulator, >0.8 m/s = community Ambulator, >1.2 m/s = crossing a street, <1.0 = increased fall risk MCID 0.05 m/s (small), 0.13 m/s (moderate), 0.06 m/s (significant)  (ANPTA Core Set of Outcome  Measures for Adults with Neurologic Conditions, 2018)   Five times Sit to Stand Test (FTSS) "Stand up and sit down as quickly as possible 5 times, keeping your arms  folded across your chest."    TIME: 26.89 seconds with BUE support at legs  Times > 13.6 seconds is associated with increased disability and morbidity (Guralnik, 2000) Times > 15 seconds is predictive of recurrent falls in healthy individuals aged 27 and older (Buatois, et al., 2008) Normal performance values in community dwelling individuals aged 82 and older (Bohannon, 2006): 60-69 years: 11.4 seconds 70-79 years: 12.6 seconds 80-89 years: 14.8 seconds  MCID: >= 2.3 seconds for Vestibular Disorders (Meretta, 2006)  Pt educated on findings of initial evaluation. Pt also provided education on initiation of HEP to include STSs for improved functional strength as noted below.   PATIENT EDUCATION: Education details: HEP, POC, goals  Person educated: Patient Education method: Explanation, Demonstration, and Handouts Education comprehension: verbalized understanding and needs further education  HOME EXERCISE PROGRAM: Access Code: [email or web address] URL: [email or web address] Date: 04/20/2024 Prepared by: Chiquita Silvan  Exercises - Sit to Stand  - 2-3 x daily - 7 x weekly - 2-3 sets - 10 reps  GOALS: Goals reviewed with patient? Yes  SHORT TERM GOALS: Target date: 06/01/2024  Patient will be independent with home exercise program to improve strength/mobility for increased functional independence with ADLs and mobility. Baseline: HEP established at eval Goal status: INITIAL    LONG TERM GOALS: Target date: 07/13/2024  Patient will complete five times sit to stand test (5XSTS) in < 15 seconds indicating an increased LE strength and improved balance. Baseline: 26.89 seconds with BUE support at legs Goal status: INITIAL  2.  Patient will increase Berg Balance score by > 6 points to demonstrate decreased fall  risk during functional activities. Baseline: to be assessed Goal status: INITIAL  3.  Patient will increase 10 meter walk test to >1.32m/s as to improve gait speed for better community ambulation and to reduce fall risk. Baseline: Average Normal speed: 0.49 m/s with CGA, no AD; Average Fast speed: 0.61 m/s with CGA, no AD Goal status: INITIAL  4.  Patient will improve ABC scale score >80% to demonstrate increased confidence with functional mobility and ADLs Baseline: 55% Goal status: INITIAL    ASSESSMENT:  CLINICAL IMPRESSION: Patient is a 53 y.o. female who was seen today for physical therapy evaluation and treatment for imbalance. Pt able to provide detailed subjective history as related to impairment dating back to 2022. Pt demonstrating decreased confidence with balance as evidenced by ABC scale. Pt also with decreased gait speed as evidenced by ; demonstrating wide BOS with decreased coordination & step length during gait assessment. Pt also demonstrating decreased functional strength; pt provided STSs as part of HEP. Pt educated on POC, benefit of PT for impairments. The pt will benefit from further skilled PT to improve these deficits in order to increase QOL and ease/safety with ADLs.     OBJECTIVE IMPAIRMENTS: Abnormal gait, decreased activity tolerance, decreased balance, decreased coordination, decreased endurance, decreased mobility, difficulty walking, decreased strength, and pain.   ACTIVITY LIMITATIONS: carrying, lifting, bending, standing, stairs, and locomotion level  PARTICIPATION LIMITATIONS: cleaning, laundry, shopping, community activity, and occupation  PERSONAL FACTORS: Age, Fitness, Past/current experiences, Sex, and 3+ comorbidities: T2DM, prior history of heavy alcohol use, prior CVA, depression, RA are also affecting patient's functional outcome.   REHAB POTENTIAL: Good  CLINICAL DECISION MAKING: Evolving/moderate complexity  EVALUATION COMPLEXITY:  Moderate  PLAN:  PT FREQUENCY: 1-2x/week  PT DURATION: 12 weeks  PLANNED INTERVENTIONS: [id number]- PT Re-evaluation, 97750- Physical Performance Testing, 97110-Therapeutic exercises, 97530- Therapeutic activity, [id number]- Neuromuscular re-education, 97535- Self Care, [id number]- Manual  therapy, [id number]- Gait training, 04007- Canalith repositioning, [id number] (1-2 muscles), 20561 (3+ muscles)- Dry Needling, Patient/Family education, Balance training, Stair training, Joint mobilization, Spinal mobilization, Vestibular training, Visual/preceptual remediation/compensation, Cryotherapy, and Moist heat  PLAN FOR NEXT SESSION:  BERG Functional strengthening Balance measures based on findings of BERG  Chiquita Silvan, SPT Physical Therapy Student - [facility]  [facility]  04/20/2024, 7:34 PM

## 2024-04-21 ENCOUNTER — Ambulatory Visit

## 2024-04-26 ENCOUNTER — Ambulatory Visit

## 2024-04-27 ENCOUNTER — Ambulatory Visit: Attending: Neurology

## 2024-04-27 DIAGNOSIS — R2689 Other abnormalities of gait and mobility: Secondary | ICD-10-CM | POA: Diagnosis present

## 2024-04-27 DIAGNOSIS — Z9181 History of falling: Secondary | ICD-10-CM | POA: Insufficient documentation

## 2024-04-27 DIAGNOSIS — M6281 Muscle weakness (generalized): Secondary | ICD-10-CM | POA: Insufficient documentation

## 2024-04-27 DIAGNOSIS — R2681 Unsteadiness on feet: Secondary | ICD-10-CM | POA: Insufficient documentation

## 2024-04-27 DIAGNOSIS — R262 Difficulty in walking, not elsewhere classified: Secondary | ICD-10-CM | POA: Insufficient documentation

## 2024-04-27 NOTE — Therapy (Signed)
 OUTPATIENT PHYSICAL THERAPY NEURO TREATMENT   Patient Name: April Chaney MRN: [id number] DOB:Nov 26, 1970, 53 y.o., female Today's Date: 04/28/2024   PCP: [name], PA REFERRING PROVIDER: Lane Arthea BRAVO, MD  END OF SESSION:  PT End of Session - 04/27/24 1454     Visit Number 2    Number of Visits 24    Date for Recertification  07/13/24    Progress Note Due on Visit 10    PT Start Time 1450    PT Stop Time 1531    PT Time Calculation (min) 41 min    Equipment Utilized During Treatment Gait belt    Activity Tolerance Patient tolerated treatment well    Behavior During Therapy WFL for tasks assessed/performed           Past Medical History:  Diagnosis Date   Arthritis    Colon polyps    Complication of anesthesia    woke up during 1 colonscopy 3 yrs ago   Diabetes mellitus without complication (HCC)    type 2   DM (diabetes mellitus) (HCC)    Family history of adverse reaction to anesthesia    mother slow to awaken after 1 procedure   GERD (gastroesophageal reflux disease)    Hypertension    hx of no bp meds for last 11 years after weight loss   Pancreatitis 3-4 yrs ago and feb 2018   Presence of artificial hip    has to take antibiotics prior to any procedure-dental, etc   Ulcer    Past Surgical History:  Procedure Laterality Date   CATARACT EXTRACTION W/PHACO Left 04/16/2023   Procedure: CATARACT EXTRACTION PHACO AND INTRAOCULAR LENS PLACEMENT (IOC) LEFT DIABETIC 7.53 00:42.8;  Surgeon: Enola Feliciano Hugger, MD;  Location: [facility];  Service: Ophthalmology;  Laterality: Left;   COLONOSCOPY WITH PROPOFOL  N/A 02/11/2019   Procedure: COLONOSCOPY WITH PROPOFOL ;  Surgeon: Gaylyn Gladis PENNER, MD;  Location: [facility];  Service: Endoscopy;  Laterality: N/A;   colonscopy     x 4   ESOPHAGOGASTRODUODENOSCOPY (EGD) WITH PROPOFOL  N/A 02/11/2019   Procedure: ESOPHAGOGASTRODUODENOSCOPY (EGD) WITH PROPOFOL ;  Surgeon: Gaylyn Gladis PENNER,  MD;  Location: [facility] ENDOSCOPY;  Service: Endoscopy;  Laterality: N/A;   EUS N/A 01/01/2017   Procedure: UPPER ENDOSCOPIC ULTRASOUND (EUS) LINEAR;  Surgeon: Teressa Toribio SQUIBB, MD;  Location: [facility];  Service: Endoscopy;  Laterality: N/A;   FINE NEEDLE ASPIRATION  01/01/2017   Procedure: FINE NEEDLE ASPIRATION;  Surgeon: Teressa Toribio SQUIBB, MD;  Location: [facility] END[facility];  Service: Endoscopy;;   INCISION AND DRAINAGE ABSCESS N/A 12/29/2020   Procedure: INCISION AND DRAINAGE ABSCESS;  Surgeon: Rodolph Romano, MD;  Location: [facility] ORS;  Service: General;  Laterality: N/A;   JOINT REPLACEMENT     hip replacement   LEFT HEART CATH AND CORONARY ANGIOGRAPHY N/A 04/26/2019   Procedure: LEFT HEART CATH AND CORONARY ANGIOGRAPHY;  Surgeon: Ammon Blunt, MD;  Location: [facility];  Service: Cardiovascular;  Laterality: N/A;   STRABISMUS SURGERY  12/12/2011   Procedure: REPAIR STRABISMUS;  Surgeon: Elsie MALVA Salt, MD;  Location: [facility];  Service: Ophthalmology;  Laterality: Right;   TOTAL HIP ARTHROPLASTY     bilat   Patient Active Problem List   Diagnosis Date Noted   Severe sepsis (HCC) 06/05/2021   Abscess of left thigh 05/27/2021   Abscess 12/27/2020   Acute CHF (congestive heart failure) (HCC) 04/05/2019   Epigastric pain    Alcohol-induced acute pancreatitis  Abdominal pain 08/03/2016   Encounter for health maintenance examination 10/22/2015   Rheumatoid arthritis involving multiple sites with positive rheumatoid factor (HCC) 10/22/2015   BMI 35.0-35.9,adult 09/23/2015   Elevated rheumatoid factor 09/23/2015   History of adenomatous polyp of colon 09/23/2015   Alopecia 08/18/2015   Benign essential hypertension 08/18/2015   Benign neoplasm of colon 08/18/2015   Epigastric discomfort 08/18/2015   GERD without esophagitis 08/18/2015   Heartburn 08/18/2015   High risk medication use 08/18/2015   Hyperlipidemia 08/18/2015   Hyponatremia 08/18/2015    Infective otitis externa of left ear 08/18/2015   Insomnia 08/18/2015   Neuropathy of both feet 08/18/2015   Nicotine  dependence 08/18/2015   Oligomenorrhea 08/18/2015   Obesity 08/18/2015   Gastro-esophageal reflux disease with esophagitis 08/18/2015   Stress incontinence in female 08/18/2015   Type 2 diabetes, uncontrolled, with neuropathy 08/18/2015   Uncontrolled diabetes mellitus with complications 08/18/2015   Vitamin D  deficiency 08/18/2015   Wheezing 08/18/2015   Colon polyps 01/06/2014   Pancreatic mass 01/06/2014   Pancreatitis 01/06/2014    ONSET DATE: since hip replacement, L side re-done in 2023  REFERRING DIAG: R26.89 (ICD-10-CM) - Imbalance  THERAPY DIAG:  Difficulty in walking, not elsewhere classified  Muscle weakness (generalized)  Unsteadiness on feet  Other abnormalities of gait and mobility  History of falling  Rationale for Evaluation and Treatment: Rehabilitation  SUBJECTIVE:                                                                                                                                                                                             SUBJECTIVE STATEMENT:  From Today: Patient rates pain in left hip -6/10  with OTC pain meds. Patient very detailed again explaining her history as she had not worked with chartered loss adjuster today.     From EVAL: Pt stating overall imbalance since last L hip replacement.   Pt stating that she always uses cart in store to have something to hold onto for stability. Pt stating that she uses a cane occasionally, but keeps one with her (in car, around house, etc.) just in case she may need it. Pt reporting that she has Good days & bad days related to imbalance, reporting that she feels more imbalanced when her L hip is painful.   Pt reporting that she had to have bilateral hip replacements at an early age (in 2010, early 15's) due to having Pediatric bones in my body. Pt reporting also having broken ankle  in 2022.   Pt stating that she was hospitalized from July-October in 2022; was hospitalized in [facility] because she lost her  mind for 3 months - likely due to infection in L hip. During this period, she was in rehab center for 2 months receiving IV antibiotics. Two months after returning home, had 2nd hip replacement in L.   Of note, pt reporting that prior to hip replacements, had gone to PT & had a therapist that she felt like pushed her too hard & did not make any progress.   Pt stating that she had gone to orthopedic surgeon, who then referred her to her original surgeon, now refusing to do surgery.   Pt accompanied by: self  PERTINENT HISTORY:  53 y/o female with history of multiple falls over the past year. She has significant L hip pain. Patient had recent fall after passing out (likely due to low blood sugar per PCP note from 04/04/24) and hitting her head causing bruising to her eye.  PMH: T2DM, previous history of heavy alcohol use, prior CVA, depression, RA  Per neurology note 03/29/24: 04/01/2023 CT HEAD WO IMPRESSION:  1. No evidence of acute intracranial abnormality.  2. 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    PAIN:  Are you having pain? No; L side hip pain occasionally  PRECAUTIONS: Fall  RED FLAGS: None   WEIGHT BEARING RESTRICTIONS: No  FALLS: Has patient fallen in last 6 months? Yes. Number of falls 1, tripped over carpet about 2 weeks. Hit head, had some bruising/carpet burn.   LIVING ENVIRONMENT: Lives with: lives alone Lives in: House/apartment Stairs: Yes: Internal: ~12 steps; on left going up; pt reporting only guest bedroom upstairs, never using upstairs portion of house.  Has following equipment at home: Single point cane and shower chair  PLOF: Independent  PATIENT GOALS: feel more balanced   OBJECTIVE:  Note: Objective measures were completed at Evaluation  unless otherwise noted.  DIAGNOSTIC FINDINGS: No recent imaging  COGNITION: Overall cognitive status: Within functional limits for tasks assessed   SENSATION: Per neurology note 03/29/24: Decreased sensation to all modalities in a length dependent gradient in the lower extremities. Otherwise, sensation is within normal limits to all modalities throughout.    COORDINATION: Per neurology note 03/29/24: Finger to nose testing is within normal limits.    POSTURE: rounded shoulders and forward head  LOWER EXTREMITY ROM:     Active  Right Eval Left Eval  Hip flexion    Hip extension    Hip abduction    Hip adduction    Hip internal rotation    Hip external rotation    Knee flexion    Knee extension    Ankle dorsiflexion    Ankle plantarflexion    Ankle inversion    Ankle eversion     (Blank rows = not tested)  LOWER EXTREMITY MMT:    MMT Right Eval Left Eval  Hip flexion    Hip extension    Hip abduction    Hip adduction    Hip internal rotation    Hip external rotation    Knee flexion    Knee extension    Ankle dorsiflexion    Ankle plantarflexion    Ankle inversion    Ankle eversion    (Blank rows = not tested)  BED MOBILITY:  Not tested  TRANSFERS: Sit to stand: SBA  Assistive device utilized: extra time required     Stand to sit: SBA  Assistive device utilized: extra time required      RAMP:  Not tested  CURB:  Not tested  STAIRS: Not tested GAIT: Findings: Gait Characteristics: decreased speed, step through pattern, decreased stride length, and wide BOS, Distance walked: distance needed for gait into clinic & physical performance measures, Assistive device utilized:None, and Level of assistance: CGA  FUNCTIONAL TESTS:  5 times sit to stand: 26.89 10 meter walk test: avg normal: 0.49 m/s, avg fast: 0.61 m/s with no AD, CGA  PATIENT SURVEYS:  ABC scale:   The Activities-Specific Balance Confidence (ABC) Scale 0% 10 20 30  40 50 60 70 80 90  100% No confidence<->completely confident  "How confident are you that you will not lose your balance or become unsteady when you . . .    Date tested 04/20/2024  Walk around the house 70%  2. Walk up or down stairs 50%  3. Bend over and pick up a slipper from in front of a closet floor 40%  4. Reach for a small can off a shelf at eye level 70%  5. Stand on tip toes and reach for something above your head 70%  6. Stand on a chair and reach for something 0%  7. Sweep the floor 70%  8. Walk outside the house to a car parked in the driveway 70%  9. Get into or out of a car 60%  10. Walk across a parking lot to the mall 70%  11. Walk up or down a ramp 50%  12. Walk in a crowded mall where people rapidly walk past you 70%  13. Are bumped into by people as you walk through the mall 60%  14. Step onto or off of an escalator while you are holding onto the railing 50%  15. Step onto or off an escalator while holding onto parcels such that you cannot hold onto the railing 30%  16. Walk outside on icy sidewalks 50%  Total: #/16 55%    10 Meter Walk Test: Patient instructed to walk 10 meters (32.8 ft) as quickly and as safely as possible at their normal speed x2 and at a fast speed x2. Time measured from 2 meter mark to 8 meter mark to accommodate ramp-up and ramp-down.  Normal speed 1: 0.44 m/s (22.60 seconds) Normal speed 2: 0.53 m/s (18.90 seconds) Average Normal speed: 0.49 m/s with CGA, no AD Fast speed 1: 0.61 m/s (16.41 seconds) Fast speed 2: 0.61 m/s (16.52 seconds) Average Fast speed: 0.61 m/s with CGA, no AD Cut off scores: <0.4 m/s = household Ambulator, 0.4-0.8 m/s = limited community Ambulator, >0.8 m/s = community Ambulator, >1.2 m/s = crossing a street, <1.0 = increased fall risk MCID 0.05 m/s (small), 0.13 m/s (moderate), 0.06 m/s (significant)  (ANPTA Core Set of Outcome Measures for Adults with Neurologic Conditions, 2018)   Five times Sit to Stand Test (FTSS) "Stand up and  sit down as quickly as possible 5 times, keeping your arms folded across your chest."    TIME: 26.89 seconds with BUE support at legs  Times > 13.6 seconds is associated with increased disability and morbidity (Guralnik, 2000) Times > 15 seconds is predictive of recurrent falls in healthy individuals aged 76 and older (Buatois, et al., 2008) Normal performance values in community dwelling individuals aged 72 and older (Bohannon, 2006): 60-69 years: 11.4 seconds 70-79 years: 12.6 seconds 80-89 years: 14.8 seconds  MCID: >= 2.3 seconds for Vestibular Disorders (Meretta, 2006)  Pt educated on findings of initial evaluation. Pt also provided education on initiation of HEP to include STSs for improved functional strength as  noted below.                                                                                                                              TREATMENT DATE: 04/28/24      [facility] PT Assessment - 04/27/24 1457       Standardized Balance Assessment   Standardized Balance Assessment Berg Balance Test      Berg Balance Test   Sit to Stand Able to stand without using hands and stabilize independently    Standing Unsupported Able to stand safely 2 minutes    Sitting with Back Unsupported but Feet Supported on Floor or Stool Able to sit safely and securely 2 minutes    Stand to Sit Sits safely with minimal use of hands    Transfers Able to transfer safely, minor use of hands    Standing Unsupported with Eyes Closed Able to stand 10 seconds safely    Standing Unsupported with Feet Together Able to place feet together independently and stand 1 minute safely    From Standing, Reach Forward with Outstretched Arm Can reach forward >12 cm safely (5)    From Standing Position, Pick up Object from Floor Able to pick up shoe, needs supervision    From Standing Position, Turn to Look Behind Over each Shoulder Looks behind from both sides and weight shifts well    Turn 360 Degrees Able to  turn 360 degrees safely but slowly    Standing Unsupported, Alternately Place Feet on Step/Stool Able to stand independently and complete 8 steps >20 seconds    Standing Unsupported, One Foot in Front Able to plae foot ahead of the other independently and hold 30 seconds    Standing on One Leg Unable to try or needs assist to prevent fall    Total Score 46            Self care/Home management:  - Reviewed sit to stand and some exercises from old hip replacement days.  Discussed standing hip activities- hip abd, ext, flex and other LE strengthening- minisquats and calf raises.  Patient spent a lot of today explaining her history and her physical condition.       PATIENT EDUCATION: Education details: HEP, POC, goals  Person educated: Patient Education method: Explanation, Demonstration, and Handouts Education comprehension: verbalized understanding and needs further education  HOME EXERCISE PROGRAM: Access Code: [email or web address] URL: [email or web address] Date: 04/20/2024 Prepared by: Chiquita Silvan  Exercises - Sit to Stand  - 2-3 x daily - 7 x weekly - 2-3 sets - 10 reps  GOALS: Goals reviewed with patient? Yes  SHORT TERM GOALS: Target date: 06/01/2024  Patient will be independent with home exercise program to improve strength/mobility for increased functional independence with ADLs and mobility. Baseline: HEP established at eval Goal status: INITIAL    LONG TERM GOALS: Target date: 07/13/2024  Patient will complete five times sit to stand test (5XSTS) in < 15 seconds indicating  an increased LE strength and improved balance. Baseline: 26.89 seconds with BUE support at legs Goal status: INITIAL  2.  Patient will increase Berg Balance score by > 6 points to demonstrate decreased fall risk during functional activities. Baseline: to be assessed Goal status: INITIAL  3.  Patient will increase 10 meter walk test to >1.33m/s as to improve gait speed for better  community ambulation and to reduce fall risk. Baseline: Average Normal speed: 0.49 m/s with CGA, no AD; Average Fast speed: 0.61 m/s with CGA, no AD Goal status: INITIAL  4.  Patient will improve ABC scale score >80% to demonstrate increased confidence with functional mobility and ADLs Baseline: 55% Goal status: INITIAL    ASSESSMENT:  CLINICAL IMPRESSION: Patient is a 53 y.o. female who was seen today for physical therapy treatment for imbalance. Due to the patient's extensive discussion of their medical history and personal concerns, she was not able to complete the full planned exercise program planned for today. She did complete the BERG and presents with some imbalance based on BERG score. Will plan to add some basic LE strengthening and initiate balance training and add to HEP next visit.  The pt will benefit from further skilled PT to improve her weakness, imbalance and difficulty with mobility in order to increase QOL and ease/safety with ADLs.     OBJECTIVE IMPAIRMENTS: Abnormal gait, decreased activity tolerance, decreased balance, decreased coordination, decreased endurance, decreased mobility, difficulty walking, decreased strength, and pain.   ACTIVITY LIMITATIONS: carrying, lifting, bending, standing, stairs, and locomotion level  PARTICIPATION LIMITATIONS: cleaning, laundry, shopping, community activity, and occupation  PERSONAL FACTORS: Age, Fitness, Past/current experiences, Sex, and 3+ comorbidities: T2DM, prior history of heavy alcohol use, prior CVA, depression, RA are also affecting patient's functional outcome.   REHAB POTENTIAL: Good  CLINICAL DECISION MAKING: Evolving/moderate complexity  EVALUATION COMPLEXITY: Moderate  PLAN:  PT FREQUENCY: 1-2x/week  PT DURATION: 12 weeks  PLANNED INTERVENTIONS: [id number]- PT Re-evaluation, 97750- Physical Performance Testing, 97110-Therapeutic exercises, 97530- Therapeutic activity, 97112- Neuromuscular re-education, 97535-  Self Care, [id number]- Manual therapy, [phone]- Gait training, [phone]- Canalith repositioning, [phone] (1-2 muscles), [phone] (3+ muscles)- Dry Needling, Patient/Family education, Balance training, Stair training, Joint mobilization, Spinal mobilization, Vestibular training, Visual/preceptual remediation/compensation, Cryotherapy, and Moist heat  PLAN FOR NEXT SESSION:   Functional strengthening Balance measures based on findings of BERG  Chyrl London, PT Physical Therapy Student - [facility]  04/28/2024, 10:55 AM

## 2024-04-28 ENCOUNTER — Ambulatory Visit

## 2024-05-03 ENCOUNTER — Ambulatory Visit: Admitting: Physical Therapy

## 2024-05-04 ENCOUNTER — Ambulatory Visit

## 2024-05-04 DIAGNOSIS — Z9181 History of falling: Secondary | ICD-10-CM

## 2024-05-04 DIAGNOSIS — M6281 Muscle weakness (generalized): Secondary | ICD-10-CM

## 2024-05-04 DIAGNOSIS — R262 Difficulty in walking, not elsewhere classified: Secondary | ICD-10-CM

## 2024-05-04 DIAGNOSIS — R2689 Other abnormalities of gait and mobility: Secondary | ICD-10-CM

## 2024-05-04 DIAGNOSIS — R2681 Unsteadiness on feet: Secondary | ICD-10-CM

## 2024-05-04 NOTE — Therapy (Signed)
 OUTPATIENT PHYSICAL THERAPY NEURO TREATMENT   Patient Name: April Chaney MRN: [id number] DOB:04/04/1971, 53 y.o., female Today's Date: 05/05/2024   PCP: [name], PA REFERRING PROVIDER: Lane Arthea BRAVO, MD  END OF SESSION:  PT End of Session - 05/04/24 1529     Visit Number 3    Number of Visits 24    Date for Recertification  07/13/24    Progress Note Due on Visit 10    PT Start Time 1531    PT Stop Time 1614    PT Time Calculation (min) 43 min    Equipment Utilized During Treatment Gait belt    Activity Tolerance Patient tolerated treatment well    Behavior During Therapy WFL for tasks assessed/performed           Past Medical History:  Diagnosis Date   Arthritis    Colon polyps    Complication of anesthesia    woke up during 1 colonscopy 3 yrs ago   Diabetes mellitus without complication (HCC)    type 2   DM (diabetes mellitus) (HCC)    Family history of adverse reaction to anesthesia    mother slow to awaken after 1 procedure   GERD (gastroesophageal reflux disease)    Hypertension    hx of no bp meds for last 11 years after weight loss   Pancreatitis 3-4 yrs ago and feb 2018   Presence of artificial hip    has to take antibiotics prior to any procedure-dental, etc   Ulcer    Past Surgical History:  Procedure Laterality Date   CATARACT EXTRACTION W/PHACO Left 04/16/2023   Procedure: CATARACT EXTRACTION PHACO AND INTRAOCULAR LENS PLACEMENT (IOC) LEFT DIABETIC 7.53 00:42.8;  Surgeon: Enola Feliciano Hugger, MD;  Location: [facility];  Service: Ophthalmology;  Laterality: Left;   COLONOSCOPY WITH PROPOFOL  N/A 02/11/2019   Procedure: COLONOSCOPY WITH PROPOFOL ;  Surgeon: Gaylyn Gladis PENNER, MD;  Location: [facility];  Service: Endoscopy;  Laterality: N/A;   colonscopy     x 4   ESOPHAGOGASTRODUODENOSCOPY (EGD) WITH PROPOFOL  N/A 02/11/2019   Procedure: ESOPHAGOGASTRODUODENOSCOPY (EGD) WITH PROPOFOL ;  Surgeon: Gaylyn Gladis PENNER,  MD;  Location: [facility] END[facility];  Service: Endoscopy;  Laterality: N/A;   EUS N/A 01/01/2017   Procedure: UPPER ENDOSCOPIC ULTRASOUND (EUS) LINEAR;  Surgeon: Teressa Toribio SQUIBB, MD;  Location: [facility];  Service: Endoscopy;  Laterality: N/A;   FINE NEEDLE ASPIRATION  01/01/2017   Procedure: FINE NEEDLE ASPIRATION;  Surgeon: Teressa Toribio SQUIBB, MD;  Location: [facility];  Service: Endoscopy;;   INCISION AND DRAINAGE ABSCESS N/A 12/29/2020   Procedure: INCISION AND DRAINAGE ABSCESS;  Surgeon: Rodolph Romano, MD;  Location: [facility];  Service: General;  Laterality: N/A;   JOINT REPLACEMENT     hip replacement   LEFT HEART CATH AND CORONARY ANGIOGRAPHY N/A 04/26/2019   Procedure: LEFT HEART CATH AND CORONARY ANGIOGRAPHY;  Surgeon: Ammon Blunt, MD;  Location: [facility];  Service: Cardiovascular;  Laterality: N/A;   STRABISMUS SURGERY  12/12/2011   Procedure: REPAIR STRABISMUS;  Surgeon: Elsie MALVA Salt, MD;  Location: [facility];  Service: Ophthalmology;  Laterality: Right;   TOTAL HIP ARTHROPLASTY     bilat   Patient Active Problem List   Diagnosis Date Noted   Severe sepsis (HCC) 06/05/2021   Abscess of left thigh 05/27/2021   Abscess 12/27/2020   Acute CHF (congestive heart failure) (HCC) 04/05/2019   Epigastric pain    Alcohol-induced acute pancreatitis  Abdominal pain 08/03/2016   Encounter for health maintenance examination 10/22/2015   Rheumatoid arthritis involving multiple sites with positive rheumatoid factor (HCC) 10/22/2015   BMI 35.0-35.9,adult 09/23/2015   Elevated rheumatoid factor 09/23/2015   History of adenomatous polyp of colon 09/23/2015   Alopecia 08/18/2015   Benign essential hypertension 08/18/2015   Benign neoplasm of colon 08/18/2015   Epigastric discomfort 08/18/2015   GERD without esophagitis 08/18/2015   Heartburn 08/18/2015   High risk medication use 08/18/2015   Hyperlipidemia 08/18/2015   Hyponatremia 08/18/2015    Infective otitis externa of left ear 08/18/2015   Insomnia 08/18/2015   Neuropathy of both feet 08/18/2015   Nicotine  dependence 08/18/2015   Oligomenorrhea 08/18/2015   Obesity 08/18/2015   Gastro-esophageal reflux disease with esophagitis 08/18/2015   Stress incontinence in female 08/18/2015   Type 2 diabetes, uncontrolled, with neuropathy 08/18/2015   Uncontrolled diabetes mellitus with complications 08/18/2015   Vitamin D  deficiency 08/18/2015   Wheezing 08/18/2015   Colon polyps 01/06/2014   Pancreatic mass 01/06/2014   Pancreatitis 01/06/2014    ONSET DATE: since hip replacement, L side re-done in 2023  REFERRING DIAG: R26.89 (ICD-10-CM) - Imbalance  THERAPY DIAG:  Difficulty in walking, not elsewhere classified  Muscle weakness (generalized)  Unsteadiness on feet  Other abnormalities of gait and mobility  History of falling  Rationale for Evaluation and Treatment: Rehabilitation  SUBJECTIVE:                                                                                                                                                                                             SUBJECTIVE STATEMENT:  From Today: Patient reports doing well - been painting over past few days.      From EVAL: Pt stating overall imbalance since last L hip replacement.   Pt stating that she always uses cart in store to have something to hold onto for stability. Pt stating that she uses a cane occasionally, but keeps one with her (in car, around house, etc.) just in case she may need it. Pt reporting that she has Good days & bad days related to imbalance, reporting that she feels more imbalanced when her L hip is painful.   Pt reporting that she had to have bilateral hip replacements at an early age (in 2010, early 52's) due to having Pediatric bones in my body. Pt reporting also having broken ankle in 2022.   Pt stating that she was hospitalized from July-October in 2022; was  hospitalized in [facility] because she lost her mind for 3 months - likely due to infection in L hip. During this period,  she was in rehab center for 2 months receiving IV antibiotics. Two months after returning home, had 2nd hip replacement in L.   Of note, pt reporting that prior to hip replacements, had gone to PT & had a therapist that she felt like pushed her too hard & did not make any progress.   Pt stating that she had gone to orthopedic surgeon, who then referred her to her original surgeon, now refusing to do surgery.   Pt accompanied by: self  PERTINENT HISTORY:  53 y/o female with history of multiple falls over the past year. She has significant L hip pain. Patient had recent fall after passing out (likely due to low blood sugar per PCP note from 04/04/24) and hitting her head causing bruising to her eye.  PMH: T2DM, previous history of heavy alcohol use, prior CVA, depression, RA  Per neurology note 03/29/24: 04/01/2023 CT HEAD WO IMPRESSION:  1. No evidence of acute intracranial abnormality.  2. 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    PAIN:  Are you having pain? No; L side hip pain occasionally  PRECAUTIONS: Fall  RED FLAGS: None   WEIGHT BEARING RESTRICTIONS: No  FALLS: Has patient fallen in last 6 months? Yes. Number of falls 1, tripped over carpet about 2 weeks. Hit head, had some bruising/carpet burn.   LIVING ENVIRONMENT: Lives with: lives alone Lives in: House/apartment Stairs: Yes: Internal: ~12 steps; on left going up; pt reporting only guest bedroom upstairs, never using upstairs portion of house.  Has following equipment at home: Single point cane and shower chair  PLOF: Independent  PATIENT GOALS: feel more balanced   OBJECTIVE:  Note: Objective measures were completed at Evaluation unless otherwise noted.  DIAGNOSTIC FINDINGS: No recent  imaging  COGNITION: Overall cognitive status: Within functional limits for tasks assessed   SENSATION: Per neurology note 03/29/24: Decreased sensation to all modalities in a length dependent gradient in the lower extremities. Otherwise, sensation is within normal limits to all modalities throughout.    COORDINATION: Per neurology note 03/29/24: Finger to nose testing is within normal limits.    POSTURE: rounded shoulders and forward head  LOWER EXTREMITY ROM:     Active  Right Eval Left Eval  Hip flexion    Hip extension    Hip abduction    Hip adduction    Hip internal rotation    Hip external rotation    Knee flexion    Knee extension    Ankle dorsiflexion    Ankle plantarflexion    Ankle inversion    Ankle eversion     (Blank rows = not tested)  LOWER EXTREMITY MMT:    MMT Right Eval Left Eval  Hip flexion    Hip extension    Hip abduction    Hip adduction    Hip internal rotation    Hip external rotation    Knee flexion    Knee extension    Ankle dorsiflexion    Ankle plantarflexion    Ankle inversion    Ankle eversion    (Blank rows = not tested)  BED MOBILITY:  Not tested  TRANSFERS: Sit to stand: SBA  Assistive device utilized: extra time required     Stand to sit: SBA  Assistive device utilized: extra time required      RAMP:  Not tested  CURB:  Not tested  STAIRS: Not tested GAIT: Findings: Gait Characteristics: decreased speed, step through  pattern, decreased stride length, and wide BOS, Distance walked: distance needed for gait into clinic & physical performance measures, Assistive device utilized:None, and Level of assistance: CGA  FUNCTIONAL TESTS:  5 times sit to stand: 26.89 10 meter walk test: avg normal: 0.49 m/s, avg fast: 0.61 m/s with no AD, CGA  PATIENT SURVEYS:  ABC scale:   The Activities-Specific Balance Confidence (ABC) Scale 0% 10 20 30  40 50 60 70 80 90 100% No confidence<->completely confident  "How confident  are you that you will not lose your balance or become unsteady when you . . .    Date tested 04/20/2024  Walk around the house 70%  2. Walk up or down stairs 50%  3. Bend over and pick up a slipper from in front of a closet floor 40%  4. Reach for a small can off a shelf at eye level 70%  5. Stand on tip toes and reach for something above your head 70%  6. Stand on a chair and reach for something 0%  7. Sweep the floor 70%  8. Walk outside the house to a car parked in the driveway 70%  9. Get into or out of a car 60%  10. Walk across a parking lot to the mall 70%  11. Walk up or down a ramp 50%  12. Walk in a crowded mall where people rapidly walk past you 70%  13. Are bumped into by people as you walk through the mall 60%  14. Step onto or off of an escalator while you are holding onto the railing 50%  15. Step onto or off an escalator while holding onto parcels such that you cannot hold onto the railing 30%  16. Walk outside on icy sidewalks 50%  Total: #/16 55%    10 Meter Walk Test: Patient instructed to walk 10 meters (32.8 ft) as quickly and as safely as possible at their normal speed x2 and at a fast speed x2. Time measured from 2 meter mark to 8 meter mark to accommodate ramp-up and ramp-down.  Normal speed 1: 0.44 m/s (22.60 seconds) Normal speed 2: 0.53 m/s (18.90 seconds) Average Normal speed: 0.49 m/s with CGA, no AD Fast speed 1: 0.61 m/s (16.41 seconds) Fast speed 2: 0.61 m/s (16.52 seconds) Average Fast speed: 0.61 m/s with CGA, no AD Cut off scores: <0.4 m/s = household Ambulator, 0.4-0.8 m/s = limited community Ambulator, >0.8 m/s = community Ambulator, >1.2 m/s = crossing a street, <1.0 = increased fall risk MCID 0.05 m/s (small), 0.13 m/s (moderate), 0.06 m/s (significant)  (ANPTA Core Set of Outcome Measures for Adults with Neurologic Conditions, 2018)   Five times Sit to Stand Test (FTSS) "Stand up and sit down as quickly as possible 5 times, keeping your arms  folded across your chest."    TIME: 26.89 seconds with BUE support at legs  Times > 13.6 seconds is associated with increased disability and morbidity (Guralnik, 2000) Times > 15 seconds is predictive of recurrent falls in healthy individuals aged 43 and older (Buatois, et al., 2008) Normal performance values in community dwelling individuals aged 71 and older (Bohannon, 2006): 60-69 years: 11.4 seconds 70-79 years: 12.6 seconds 80-89 years: 14.8 seconds  MCID: >= 2.3 seconds for Vestibular Disorders (Meretta, 2006)  Pt educated on findings of initial evaluation. Pt also provided education on initiation of HEP to include STSs for improved functional strength as noted below.  TREATMENT DATE: 05/05/24   BERG     [facility] PT Assessment - 05/04/24 1539       Berg Balance Test   Sit to Stand Able to stand without using hands and stabilize independently    Standing Unsupported Able to stand safely 2 minutes    Sitting with Back Unsupported but Feet Supported on Floor or Stool Able to sit safely and securely 2 minutes    Stand to Sit Sits safely with minimal use of hands    Transfers Able to transfer safely, minor use of hands    Standing Unsupported with Eyes Closed Able to stand 10 seconds with supervision    Standing Unsupported with Feet Together Able to place feet together independently and stand for 1 minute with supervision    From Standing, Reach Forward with Outstretched Arm Can reach forward >12 cm safely (5)    From Standing Position, Pick up Object from Floor Able to pick up shoe, needs supervision    From Standing Position, Turn to Look Behind Over each Shoulder Looks behind from both sides and weight shifts well    Turn 360 Degrees Able to turn 360 degrees safely but slowly    Standing Unsupported, Alternately Place Feet on Step/Stool Able to stand  independently and complete 8 steps >20 seconds    Standing Unsupported, One Foot in Front Able to plae foot ahead of the other independently and hold 30 seconds    Standing on One Leg Tries to lift leg/unable to hold 3 seconds but remains standing independently    Total Score 45             Self care/Home management:  - Reviewed sit to stand and added again to HEP -Verbally reviewed some standing hip exercises - hip abd, ext, flex and other LE strengthening- minisquats and calf raises.  - Added Tandem Balance with Counter Support  - 3 x weekly - 3 sets - 30 sec hold - Standing Single Leg Stance with Counter Support  - 3 x weekly - 3 sets - 5-10 sec hold - Standing Romberg to 3/4 Tandem Stance  - 3 x weekly - 3 sets - 15-30 sec hold - Sit to Stand with Arms Crossed  - 3 x weekly - 3 sets - 10 reps      PATIENT EDUCATION: Education details: HEP, POC, goals including balance test Person educated: Patient Education method: Explanation, Demonstration, and Handouts Education comprehension: verbalized understanding and needs further education  HOME EXERCISE PROGRAM: Access Code: [email or web address] URL: [email or web address] Date: 05/04/2024 Prepared by: Reyes London  Exercises - Standing Tandem Balance with Counter Support  - 3 x weekly - 3 sets - 30 sec hold - Standing Single Leg Stance with Counter Support  - 3 x weekly - 3 sets - 5-10 sec hold - Standing Romberg to 3/4 Tandem Stance  - 3 x weekly - 3 sets - 15-30 sec hold - Sit to Stand with Arms Crossed  - 3 x weekly - 3 sets - 10 reps       Access Code: [email or web address] URL: [email or web address] Date: 04/20/2024 Prepared by: Chiquita Silvan  Exercises - Sit to Stand  - 2-3 x daily - 7 x weekly - 2-3 sets - 10 reps  GOALS: Goals reviewed with patient? Yes  SHORT TERM GOALS: Target date: 06/01/2024  Patient will be independent with home exercise program to improve strength/mobility for increased  functional independence with ADLs and mobility. Baseline: HEP established at eval Goal status: INITIAL  LONG TERM GOALS: Target date: 07/13/2024  Patient will complete five times sit to stand test (5XSTS) in < 15 seconds indicating an increased LE strength and improved balance. Baseline: 26.89 seconds with BUE support at legs Goal status: INITIAL  2.  Patient will increase Berg Balance score by > 6 points to demonstrate decreased fall risk during functional activities. Baseline: to be assessed Goal status: INITIAL  3.  Patient will increase 10 meter walk test to >1.23m/s as to improve gait speed for better community ambulation and to reduce fall risk. Baseline: Average Normal speed: 0.49 m/s with CGA, no AD; Average Fast speed: 0.61 m/s with CGA, no AD Goal status: INITIAL  4.  Patient will improve ABC scale score >80% to demonstrate increased confidence with functional mobility and ADLs Baseline: 55% Goal status: INITIAL    ASSESSMENT:  CLINICAL IMPRESSION: Patient is a 53 y.o. female who was seen today for physical therapy treatment for imbalance. Patient performed well overall but presents with some balance impairment as seen by decreased BERG Score of 45 today. Increased time required to complete testing and patient is still limited due to hip pain. Spent remainder of session discussing and having patient perform some balance and strengthening activities to add to HEP. She will benefit from review and progression next visit. The pt will benefit from further skilled PT to improve her weakness, imbalance and difficulty with mobility in order to increase QOL and ease/safety with ADLs.     OBJECTIVE IMPAIRMENTS: Abnormal gait, decreased activity tolerance, decreased balance, decreased coordination, decreased endurance, decreased mobility, difficulty walking, decreased strength, and pain.   ACTIVITY LIMITATIONS: carrying, lifting, bending, standing, stairs, and locomotion  level  PARTICIPATION LIMITATIONS: cleaning, laundry, shopping, community activity, and occupation  PERSONAL FACTORS: Age, Fitness, Past/current experiences, Sex, and 3+ comorbidities: T2DM, prior history of heavy alcohol use, prior CVA, depression, RA are also affecting patient's functional outcome.   REHAB POTENTIAL: Good  CLINICAL DECISION MAKING: Evolving/moderate complexity  EVALUATION COMPLEXITY: Moderate  PLAN:  PT FREQUENCY: 1-2x/week  PT DURATION: 12 weeks  PLANNED INTERVENTIONS: 97164- PT Re-evaluation, 97750- Physical Performance Testing, 97110-Therapeutic exercises, 97530- Therapeutic activity, 97112- Neuromuscular re-education, 97535- Self Care, [id number]- Manual therapy, [phone]- Gait training, [phone]- Canalith repositioning, [phone] (1-2 muscles), [id number] (3+ muscles)- Dry Needling, Patient/Family education, Balance training, Stair training, Joint mobilization, Spinal mobilization, Vestibular training, Visual/preceptual remediation/compensation, Cryotherapy, and Moist heat  PLAN FOR NEXT SESSION:   Functional strengthening Balance measures based on findings of BERG  Chyrl London, PT Physical Therapy Student - [place]  [facility]  05/05/2024, 10:50 AM

## 2024-05-05 ENCOUNTER — Ambulatory Visit: Admitting: Physical Therapy

## 2024-05-10 ENCOUNTER — Ambulatory Visit: Admitting: Physical Therapy

## 2024-05-11 ENCOUNTER — Ambulatory Visit

## 2024-05-11 DIAGNOSIS — M6281 Muscle weakness (generalized): Secondary | ICD-10-CM

## 2024-05-11 DIAGNOSIS — R2681 Unsteadiness on feet: Secondary | ICD-10-CM

## 2024-05-11 DIAGNOSIS — R262 Difficulty in walking, not elsewhere classified: Secondary | ICD-10-CM

## 2024-05-11 DIAGNOSIS — R2689 Other abnormalities of gait and mobility: Secondary | ICD-10-CM

## 2024-05-11 DIAGNOSIS — Z9181 History of falling: Secondary | ICD-10-CM

## 2024-05-11 NOTE — Therapy (Signed)
 OUTPATIENT PHYSICAL THERAPY TREATMENT  Patient Name: April Chaney MRN: [id number] DOB:1970/07/10, 53 y.o., female Today's Date: 05/11/2024  PCP: April Eva POUR, PA REFERRING PROVIDER: Lane Arthea BRAVO, MD  END OF SESSION:  PT End of Session - 05/11/24 1534     Visit Number 4    Number of Visits 24    Date for Recertification  07/13/24    PT Start Time 1536    PT Stop Time 1550    PT Time Calculation (min) 14 min    Activity Tolerance Treatment limited secondary to medical complications (Comment)    Behavior During Therapy [facility] for tasks assessed/performed           Past Medical History:  Diagnosis Date   Arthritis    Colon polyps    Complication of anesthesia    woke up during 1 colonscopy 3 yrs ago   Diabetes mellitus without complication (HCC)    type 2   DM (diabetes mellitus) (HCC)    Family history of adverse reaction to anesthesia    mother slow to awaken after 1 procedure   GERD (gastroesophageal reflux disease)    Hypertension    hx of no bp meds for last 11 years after weight loss   Pancreatitis 3-4 yrs ago and feb 2018   Presence of artificial hip    has to take antibiotics prior to any procedure-dental, etc   Ulcer    Past Surgical History:  Procedure Laterality Date   CATARACT EXTRACTION W/PHACO Left 04/16/2023   Procedure: CATARACT EXTRACTION PHACO AND INTRAOCULAR LENS PLACEMENT (IOC) LEFT DIABETIC 7.53 00:42.8;  Surgeon: April Feliciano Hugger, MD;  Location: [facility];  Service: Ophthalmology;  Laterality: Left;   COLONOSCOPY WITH PROPOFOL  N/A 02/11/2019   Procedure: COLONOSCOPY WITH PROPOFOL ;  Surgeon: April Gladis PENNER, MD;  Location: [facility];  Service: Endoscopy;  Laterality: N/A;   colonscopy     x 4   ESOPHAGOGASTRODUODENOSCOPY (EGD) WITH PROPOFOL  N/A 02/11/2019   Procedure: ESOPHAGOGASTRODUODENOSCOPY (EGD) WITH PROPOFOL ;  Surgeon: April Gladis PENNER, MD;  Location: [facility] END[facility]COPY;  Service: Endoscopy;   Laterality: N/A;   EUS N/A 01/01/2017   Procedure: UPPER ENDOSCOPIC ULTRASOUND (EUS) LINEAR;  Surgeon: Teressa Toribio SQUIBB, MD;  Location: [facility] END[facility];  Service: Endoscopy;  Laterality: N/A;   FINE NEEDLE ASPIRATION  01/01/2017   Procedure: FINE NEEDLE ASPIRATION;  Surgeon: Teressa Toribio SQUIBB, MD;  Location: [facility];  Service: Endoscopy;;   INCISION AND DRAINAGE ABSCESS N/A 12/29/2020   Procedure: INCISION AND DRAINAGE ABSCESS;  Surgeon: April Romano, MD;  Location: [facility];  Service: General;  Laterality: N/A;   JOINT REPLACEMENT     hip replacement   LEFT HEART CATH AND CORONARY ANGIOGRAPHY N/A 04/26/2019   Procedure: LEFT HEART CATH AND CORONARY ANGIOGRAPHY;  Surgeon: April Blunt, MD;  Location: [facility];  Service: Cardiovascular;  Laterality: N/A;   STRABISMUS SURGERY  12/12/2011   Procedure: REPAIR STRABISMUS;  Surgeon: April MALVA Salt, MD;  Location: [facility];  Service: Ophthalmology;  Laterality: Right;   TOTAL HIP ARTHROPLASTY     bilat   Patient Active Problem List   Diagnosis Date Noted   Severe sepsis (HCC) 06/05/2021   Abscess of left thigh 05/27/2021   Abscess 12/27/2020   Acute CHF (congestive heart failure) (HCC) 04/05/2019   Epigastric pain    Alcohol-induced acute pancreatitis    Abdominal pain 08/03/2016   Encounter for health maintenance examination 10/22/2015   Rheumatoid arthritis involving multiple sites  with positive rheumatoid factor (HCC) 10/22/2015   BMI 35.0-35.9,adult 09/23/2015   Elevated rheumatoid factor 09/23/2015   History of adenomatous polyp of colon 09/23/2015   Alopecia 08/18/2015   Benign essential hypertension 08/18/2015   Benign neoplasm of colon 08/18/2015   Epigastric discomfort 08/18/2015   GERD without esophagitis 08/18/2015   Heartburn 08/18/2015   High risk medication use 08/18/2015   Hyperlipidemia 08/18/2015   Hyponatremia 08/18/2015   Infective otitis externa of left ear 08/18/2015    Insomnia 08/18/2015   Neuropathy of both feet 08/18/2015   Nicotine  dependence 08/18/2015   Oligomenorrhea 08/18/2015   Obesity 08/18/2015   Gastro-esophageal reflux disease with esophagitis 08/18/2015   Stress incontinence in female 08/18/2015   Type 2 diabetes, uncontrolled, with neuropathy 08/18/2015   Uncontrolled diabetes mellitus with complications 08/18/2015   Vitamin D  deficiency 08/18/2015   Wheezing 08/18/2015   Colon polyps 01/06/2014   Pancreatic mass 01/06/2014   Pancreatitis 01/06/2014    ONSET DATE: since hip replacement, L side re-done in 2023  REFERRING DIAG: R26.89 (ICD-10-CM) - Imbalance  THERAPY DIAG:  Difficulty in walking, not elsewhere classified  Muscle weakness (generalized)  Unsteadiness on feet  Other abnormalities of gait and mobility  History of falling  Rationale for Evaluation and Treatment: Rehabilitation  SUBJECTIVE:                                                                                                                                                                                             SUBJECTIVE STATEMENT: Pt has had a busy day today, 2 medical appointments, with minimal time between, she stopped at [employer] on the way over. Her BG monitor is alarming due to elevated BG, now in 300s. Pt reports no breakfast earlier, but all meds have been taken. No falls since last session, no questions about HEP updates.    Pt accompanied by: self  PERTINENT HISTORY:  52yoF referred to OPPT from neurology due to repeated falls, 1 related to LOC head injury, suspected role of hypoglycemia. PMH: poorly controlled IDDM2 (A1C>10), ongoing ETOH abuse, BLE polyneuropathy, bilat THA c chronic post-surgical Left hip pain, remote BG, lacunar CVA, depression, RA, ankle fracture (2022).   Pt reports increased imbalance since most recent THA. Requires 2 hand support shopping, SPC support for most AMB. Pt stating that she uses a cane occasionally, but  keeps one with her (in car, around house, etc.) just in case she may need it. Pt reporting that she has Good days & bad days related to imbalance, reporting that she feels more imbalanced when her L hip is painful.   PAIN:  Are you having pain? Yes; left hip 4/10 anterior pocket distribution.   PRECAUTIONS: Fall, BG monitoring  WEIGHT BEARING RESTRICTIONS: No  FALLS: Has patient fallen in last 6 months? Yes. Number of falls 1, tripped over carpet about 2 weeks. Hit head, had some bruising/carpet burn.   LIVING ENVIRONMENT: Lives with: lives alone Lives in: House/apartment Stairs: Yes: Internal: ~12 steps; on left going up; pt reporting only guest bedroom upstairs, never using upstairs portion of house.  Has following equipment at home: Single point cane and shower chair  PLOF: Independent  PATIENT GOALS: feel more balanced   OBJECTIVE:  Note: Objective measures were completed at Evaluation unless otherwise noted.  DIAGNOSTIC FINDINGS:  09/23/2023 EMG LOWERS IMPRESSION: Abnormal study. There is electrodiagnostic evidence of a chronic, sensorimotor polyneuropathy in the lower extremities with a superimposed left peroneal mononeuropathy.   12/27/2020 CT HEAD WO CT SOFT TISSUE NECK W IMPRESSION:  1. New extensive cerebral white matter disease. This is nonspecific  but may reflect severely age advanced chronic small vessel ischemia  given the history of diabetes and presence of chronic basal ganglia  lacunar infarcts.  GAIT: Findings: Gait Characteristics: decreased speed, step through pattern, decreased stride length, and wide BOS, Distance walked: distance needed for gait into clinic & physical performance measures, Assistive device utilized:None, and Level of assistance: CGA  FUNCTIONAL TESTS:  5 times sit to stand: 26.89 10 meter walk test: avg normal: 0.49 m/s, avg fast: 0.61 m/s with no AD, CGA  PATIENT SURVEYS:  ABC scale: 55% (04/20/24)                                                                                                                              TREATMENT DATE: 05/11/24  -STS from chair 1x10 hands free  -seated GTB clam x15 (pt does 20x)  -pt educated on CBG being too high and not appropriate for exercise at this time, pt encouraged to contact her PCP.    PATIENT EDUCATION: Education details: HEP, POC, goals including balance test Person educated: Patient Education method: Explanation, Demonstration, and Handouts Education comprehension: verbalized understanding and needs further education  HOME EXERCISE PROGRAM: Access Code: [email or web address] URL: [email or web address] Date: 05/04/2024 Prepared by: Reyes London  Exercises - Standing Tandem Balance with Counter Support  - 3 x weekly - 3 sets - 30 sec hold - Standing Single Leg Stance with Counter Support  - 3 x weekly - 3 sets - 5-10 sec hold - Standing Romberg to 3/4 Tandem Stance  - 3 x weekly - 3 sets - 15-30 sec hold - Sit to Stand with Arms Crossed  - 3 x weekly - 3 sets - 10 reps  GOALS: Goals reviewed with patient? Yes  SHORT TERM GOALS: Target date: 06/01/2024  Patient will be independent with home exercise program to improve strength/mobility for increased functional independence with ADLs and mobility. Baseline: HEP established at eval Goal status: INITIAL  LONG TERM GOALS: Target date: 07/13/2024  Patient will complete five times sit to stand test (5XSTS) in < 15 seconds indicating an increased LE strength and improved balance. Baseline: 26.89 seconds with BUE support at legs Goal status: INITIAL  2.  Patient will increase Berg Balance score by > 6 points to demonstrate decreased fall risk during functional activities. Baseline: to be assessed Goal status: INITIAL  3.  Patient will increase 10 meter walk test to >1.50m/s as to improve gait speed for better community ambulation and to reduce fall risk. Baseline: Average Normal speed: 0.49 m/s with  CGA, no AD; Average Fast speed: 0.61 m/s with CGA, no AD Goal status: INITIAL  4.  Patient will improve ABC scale score >80% to demonstrate increased confidence with functional mobility and ADLs Baseline: 55% Goal status: INITIAL   ASSESSMENT:  CLINICAL IMPRESSION: Pt arrived to clinic with elevated BG, mentioned this 5 minutes into session, then 2nd sensor alarm indicated higher level at 313g/dL. Session ended, pt educated on concern of BG being this high, she did not appear to understand concerned with it being so high, however educated her on how PT and exercise would likely cause her BG to increase further. Surprised to hear pt did not eat breakfast and then ate something she new would not work in favor of her DM2, given recent recommendations from PCP in October about eating regular meals. Session ended after 15 minutes. Patient will benefit from skilled physical therapy intervention to reduce deficits and impairments identified in evaluation, in order to reduce pain, improve quality of life, and maximize activity tolerance for ADL, IADL, and leisure/fitness. Physical therapy will help pt achieve long and short term goals of care.     OBJECTIVE IMPAIRMENTS: Abnormal gait, decreased activity tolerance, decreased balance, decreased coordination, decreased endurance, decreased mobility, difficulty walking, decreased strength, and pain.   ACTIVITY LIMITATIONS: carrying, lifting, bending, standing, stairs, and locomotion level  PARTICIPATION LIMITATIONS: cleaning, laundry, shopping, community activity, and occupation  PERSONAL FACTORS: Age, Fitness, Past/current experiences, Sex, and 3+ comorbidities: T2DM, prior history of heavy alcohol use, prior CVA, depression, RA are also affecting patient's functional outcome.   REHAB POTENTIAL: Good  CLINICAL DECISION MAKING: Evolving/moderate complexity  EVALUATION COMPLEXITY: Moderate  PLAN:  PT FREQUENCY: 1-2x/week  PT DURATION: 12  weeks  PLANNED INTERVENTIONS: [id number]- PT Re-evaluation, 97750- Physical Performance Testing, 97110-Therapeutic exercises, 97530- Therapeutic activity, 97112- Neuromuscular re-education, 97535- Self Care, [id number]- Manual therapy, [phone]- Gait training, [phone]- Canalith repositioning, [phone] (1-2 muscles), [id number] (3+ muscles)- Dry Needling, Patient/Family education, Balance training, Stair training, Joint mobilization, Spinal mobilization, Vestibular training, Visual/preceptual remediation/compensation, Cryotherapy, and Moist heat  PLAN FOR NEXT SESSION:  Has goals for 5xSTS, , BBT-    4:09 PM, 05/11/24 Peggye JAYSON Linear, PT, DPT Physical Therapist - [facility]  Outpatient Physical Therapy- [facility] [phone]

## 2024-05-12 ENCOUNTER — Ambulatory Visit: Admitting: Physical Therapy

## 2024-05-16 ENCOUNTER — Ambulatory Visit: Admitting: Physical Therapy

## 2024-05-16 DIAGNOSIS — M6281 Muscle weakness (generalized): Secondary | ICD-10-CM

## 2024-05-16 DIAGNOSIS — R2681 Unsteadiness on feet: Secondary | ICD-10-CM

## 2024-05-16 DIAGNOSIS — R262 Difficulty in walking, not elsewhere classified: Secondary | ICD-10-CM | POA: Diagnosis not present

## 2024-05-16 DIAGNOSIS — Z9181 History of falling: Secondary | ICD-10-CM

## 2024-05-16 DIAGNOSIS — R2689 Other abnormalities of gait and mobility: Secondary | ICD-10-CM

## 2024-05-16 NOTE — Therapy (Signed)
 OUTPATIENT PHYSICAL THERAPY TREATMENT  Patient Name: April Chaney MRN: [id number] DOB:[birth year], 53 y.o., female Today's Date: 05/16/2024  PCP: [name], PA REFERRING PROVIDER: Lane Arthea BRAVO, MD  END OF SESSION:  PT End of Session - 05/16/24 1410     Visit Number 5    Number of Visits 24    Date for Recertification  07/13/24    PT Start Time 1410    PT Stop Time 1445    PT Time Calculation (min) 35 min    Equipment Utilized During Treatment Gait belt    Activity Tolerance Treatment limited secondary to medical complications (Comment)    Behavior During Therapy WFL for tasks assessed/performed           Past Medical History:  Diagnosis Date   Arthritis    Colon polyps    Complication of anesthesia    woke up during 1 colonscopy 3 yrs ago   Diabetes mellitus without complication (HCC)    type 2   DM (diabetes mellitus) (HCC)    Family history of adverse reaction to anesthesia    mother slow to awaken after 1 procedure   GERD (gastroesophageal reflux disease)    Hypertension    hx of no bp meds for last 11 years after weight loss   Pancreatitis 3-4 yrs ago and feb 2018   Presence of artificial hip    has to take antibiotics prior to any procedure-dental, etc   Ulcer    Past Surgical History:  Procedure Laterality Date   CATARACT EXTRACTION W/PHACO Left 04/16/2023   Procedure: CATARACT EXTRACTION PHACO AND INTRAOCULAR LENS PLACEMENT (IOC) LEFT DIABETIC 7.53 00:42.8;  Surgeon: Enola Feliciano Hugger, MD;  [facility]: [facility];  Service: Ophthalmology;  Laterality: Left;   COLONOSCOPY WITH PROPOFOL  N/A 02/11/2019   Procedure: COLONOSCOPY WITH PROPOFOL ;  Surgeon: Gaylyn Gladis PENNER, MD;  Location: [facility];  Service: Endoscopy;  Laterality: N/A;   colonscopy     x 4   ESOPHAGOGASTRODUODENOSCOPY (EGD) WITH PROPOFOL  N/A 02/11/2019   Procedure: ESOPHAGOGASTRODUODENOSCOPY (EGD) WITH PROPOFOL ;  Surgeon: Gaylyn Gladis PENNER, MD;   Location: [facility] END[facility]COPY;  Service: Endoscopy;  Laterality: N/A;   EUS N/A 01/01/2017   Procedure: UPPER ENDOSCOPIC ULTRASOUND (EUS) LINEAR;  Surgeon: Teressa Toribio SQUIBB, MD;  Location: [facility];  Service: Endoscopy;  Laterality: N/A;   FINE NEEDLE ASPIRATION  01/01/2017   Procedure: FINE NEEDLE ASPIRATION;  Surgeon: Teressa Toribio SQUIBB, MD;  Location: [facility];  Service: Endoscopy;;   INCISION AND DRAINAGE ABSCESS N/A 12/29/2020   Procedure: INCISION AND DRAINAGE ABSCESS;  Surgeon: Rodolph Romano, MD;  Location: [facility] ORS;  Service: General;  Laterality: N/A;   JOINT REPLACEMENT     hip replacement   LEFT HEART CATH AND CORONARY ANGIOGRAPHY N/A 04/26/2019   Procedure: LEFT HEART CATH AND CORONARY ANGIOGRAPHY;  Surgeon: Ammon Blunt, MD;  Location: [facility];  Service: Cardiovascular;  Laterality: N/A;   STRABISMUS SURGERY  12/12/2011   Procedure: REPAIR STRABISMUS;  Surgeon: Elsie MALVA Salt, MD;  Location: [facility];  Service: Ophthalmology;  Laterality: Right;   TOTAL HIP ARTHROPLASTY     bilat   Patient Active Problem List   Diagnosis Date Noted   Severe sepsis (HCC) 06/05/2021   Abscess of left thigh 05/27/2021   Abscess 12/27/2020   Acute CHF (congestive heart failure) (HCC) 04/05/2019   Epigastric pain    Alcohol-induced acute pancreatitis    Abdominal pain 08/03/2016   Encounter for health maintenance  examination 10/22/2015   Rheumatoid arthritis involving multiple sites with positive rheumatoid factor (HCC) 10/22/2015   BMI 35.0-35.9,adult 09/23/2015   Elevated rheumatoid factor 09/23/2015   History of adenomatous polyp of colon 09/23/2015   Alopecia 08/18/2015   Benign essential hypertension 08/18/2015   Benign neoplasm of colon 08/18/2015   Epigastric discomfort 08/18/2015   GERD without esophagitis 08/18/2015   Heartburn 08/18/2015   High risk medication use 08/18/2015   Hyperlipidemia 08/18/2015   Hyponatremia 08/18/2015    Infective otitis externa of left ear 08/18/2015   Insomnia 08/18/2015   Neuropathy of both feet 08/18/2015   Nicotine  dependence 08/18/2015   Oligomenorrhea 08/18/2015   Obesity 08/18/2015   Gastro-esophageal reflux disease with esophagitis 08/18/2015   Stress incontinence in female 08/18/2015   Type 2 diabetes, uncontrolled, with neuropathy 08/18/2015   Uncontrolled diabetes mellitus with complications 08/18/2015   Vitamin D  deficiency 08/18/2015   Wheezing 08/18/2015   Colon polyps 01/06/2014   Pancreatic mass 01/06/2014   Pancreatitis 01/06/2014    ONSET DATE: since hip replacement, L side re-done in 2023  REFERRING DIAG: R26.89 (ICD-10-CM) - Imbalance  THERAPY DIAG:  Difficulty in walking, not elsewhere classified  Muscle weakness (generalized)  Unsteadiness on feet  Other abnormalities of gait and mobility  History of falling  Rationale for Evaluation and Treatment: Rehabilitation  SUBJECTIVE:                                                                                                                                                                                             SUBJECTIVE STATEMENT:  Pt states that she is doing well. States that she may have misplaced SPC last time she was here for PT. Reports that she has been painting walls since the people she paid to do it have not done the work in over a month. Reports that BG has been a little hight today, despite having not eaten anything today.     Pt accompanied by: self  PERTINENT HISTORY:  52yoF referred to OPPT from neurology due to repeated falls, 1 related to LOC head injury, suspected role of hypoglycemia. PMH: poorly controlled IDDM2 (A1C>10), ongoing ETOH abuse, BLE polyneuropathy, bilat THA c chronic post-surgical Left hip pain, remote BG, lacunar CVA, depression, RA, ankle fracture (2022).   Pt reports increased imbalance since most recent THA. Requires 2 hand support shopping, SPC support for  most AMB. Pt stating that she uses a cane occasionally, but keeps one with her (in car, around house, etc.) just in case she may need it. Pt reporting that she has Good days & bad days related to imbalance, reporting  that she feels more imbalanced when her L hip is painful.   PAIN:  Are you having pain? Yes; left hip 4/10 anterior pocket distribution.   PRECAUTIONS: Fall, BG monitoring  WEIGHT BEARING RESTRICTIONS: No  FALLS: Has patient fallen in last 6 months? Yes. Number of falls 1, tripped over carpet about 2 weeks. Hit head, had some bruising/carpet burn.   LIVING ENVIRONMENT: Lives with: lives alone Lives in: House/apartment Stairs: Yes: Internal: ~12 steps; on left going up; pt reporting only guest bedroom upstairs, never using upstairs portion of house.  Has following equipment at home: Single point cane and shower chair  PLOF: Independent  PATIENT GOALS: feel more balanced   OBJECTIVE:  Note: Objective measures were completed at Evaluation unless otherwise noted.  DIAGNOSTIC FINDINGS:  09/23/2023 EMG LOWERS IMPRESSION: Abnormal study. There is electrodiagnostic evidence of a chronic, sensorimotor polyneuropathy in the lower extremities with a superimposed left peroneal mononeuropathy.   12/27/2020 CT HEAD WO CT SOFT TISSUE NECK W IMPRESSION:  1. New extensive cerebral white matter disease. This is nonspecific  but may reflect severely age advanced chronic small vessel ischemia  given the history of diabetes and presence of chronic basal ganglia  lacunar infarcts.  GAIT: Findings: Gait Characteristics: decreased speed, step through pattern, decreased stride length, and wide BOS, Distance walked: distance needed for gait into clinic & physical performance measures, Assistive device utilized:None, and Level of assistance: CGA  FUNCTIONAL TESTS:  5 times sit to stand: 26.89 10 meter walk test: avg normal: 0.49 m/s, avg fast: 0.61 m/s with no AD, CGA  PATIENT  SURVEYS:  ABC scale: 55% (04/20/24)                                                                                                                             TREATMENT DATE: 05/16/24   BG assessed throughout session 197 at start of PT treatment, decreased to 165 at end of PT treatment.   - STS from chair 2x10 hands free  - forward/reverse gait 34ft x 5  - side stepping R and L with no to light UE support across plinth x 5 each  - stair ascent /descent 4 x 2 light UE support with descent. Able to progress from step to step through on second bout.  - standing hip extension x 12 bil  - Standing hip flexion x 15 bil.   Cues for posture and awareness of position of BLE to improve safety with stair management and variable mobility training. Mild sensory ataxia noted on the LLE>RLE.    PATIENT EDUCATION: Education details: HEP, POC, goals including balance test Person educated: Patient Education method: Explanation, Demonstration, and Handouts Education comprehension: verbalized understanding and needs further education  HOME EXERCISE PROGRAM: Access Code: [email or web address] URL: [email or web address] Date: 05/04/2024 Prepared by: Reyes London  Exercises - Standing Tandem Balance with Counter Support  - 3 x weekly - 3 sets - 30 sec hold - Standing Single Leg Stance with  Counter Support  - 3 x weekly - 3 sets - 5-10 sec hold - Standing Romberg to 3/4 Tandem Stance  - 3 x weekly - 3 sets - 15-30 sec hold - Sit to Stand with Arms Crossed  - 3 x weekly - 3 sets - 10 reps  GOALS: Goals reviewed with patient? Yes  SHORT TERM GOALS: Target date: 06/01/2024  Patient will be independent with home exercise program to improve strength/mobility for increased functional independence with ADLs and mobility. Baseline: HEP established at eval Goal status: INITIAL  LONG TERM GOALS: Target date: 07/13/2024  Patient will complete five times sit to stand test (5XSTS) in < 15 seconds  indicating an increased LE strength and improved balance. Baseline: 26.89 seconds with BUE support at legs Goal status: INITIAL  2.  Patient will increase Berg Balance score by > 6 points to demonstrate decreased fall risk during functional activities. Baseline: 45 Goal status: INITIAL  3.  Patient will increase 10 meter walk test to >1.26m/s as to improve gait speed for better community ambulation and to reduce fall risk. Baseline: Average Normal speed: 0.49 m/s with CGA, no AD; Average Fast speed: 0.61 m/s with CGA, no AD Goal status: INITIAL  4.  Patient will improve ABC scale score >80% to demonstrate increased confidence with functional mobility and ADLs Baseline: 55% Goal status: INITIAL   ASSESSMENT:  CLINICAL IMPRESSION:   Pt arrived to clinic with slightly elevated BG; but below recommended guidelines to cease PT. BG also noted to decreased to 165 at end of session. PT treatment focused on functional movement and dynamic balance training including reverse gait, stair management. Pt verbose and mildly tangential throughout session, but was able to participate in all interventions with maintaining dialogue. Patient will benefit from skilled physical therapy intervention to reduce deficits and impairments identified in evaluation, in order to reduce pain, improve quality of life, and maximize activity tolerance for ADL, IADL, and leisure/fitness. Physical therapy will help pt achieve long and short term goals of care.     OBJECTIVE IMPAIRMENTS: Abnormal gait, decreased activity tolerance, decreased balance, decreased coordination, decreased endurance, decreased mobility, difficulty walking, decreased strength, and pain.   ACTIVITY LIMITATIONS: carrying, lifting, bending, standing, stairs, and locomotion level  PARTICIPATION LIMITATIONS: cleaning, laundry, shopping, community activity, and occupation  PERSONAL FACTORS: Age, Fitness, Past/current experiences, Sex, and 3+  comorbidities: T2DM, prior history of heavy alcohol use, prior CVA, depression, RA are also affecting patient's functional outcome.   REHAB POTENTIAL: Good  CLINICAL DECISION MAKING: Evolving/moderate complexity  EVALUATION COMPLEXITY: Moderate  PLAN:  PT FREQUENCY: 1-2x/week  PT DURATION: 12 weeks  PLANNED INTERVENTIONS: [id number]- PT Re-evaluation, 97750- Physical Performance Testing, 97110-Therapeutic exercises, 97530- Therapeutic activity, [id number]- Neuromuscular re-education, 97535- Self Care, [id number]- Manual therapy, [id number]- Gait training, [phone]- Canalith repositioning, [phone] (1-2 muscles), [id number] (3+ muscles)- Dry Needling, Patient/Family education, Balance training, Stair training, Joint mobilization, Spinal mobilization, Vestibular training, Visual/preceptual remediation/compensation, Cryotherapy, and Moist heat  PLAN FOR NEXT SESSION:   Dynamic balance and gait training.   Continue to Monitor and educate on BG management.   Massie Dollar PT, DPT  Physical Therapist - [place]  [facility]  5:11 PM 05/16/24

## 2024-05-17 ENCOUNTER — Ambulatory Visit: Admitting: Physical Therapy

## 2024-05-22 NOTE — Therapy (Signed)
 OUTPATIENT PHYSICAL THERAPY TREATMENT  Patient Name: April Chaney MRN: [id number] DOB:[birth year], 53 y.o., female Today's Date: 05/24/2024  PCP: [name], PA REFERRING PROVIDER: Lane Arthea BRAVO, MD  END OF SESSION:  PT End of Session - 05/23/24 1504     Visit Number 6    Number of Visits 24    Date for Recertification  07/13/24    Progress Note Due on Visit 10    PT Start Time 1448    PT Stop Time 1528    PT Time Calculation (min) 40 min    Equipment Utilized During Treatment Gait belt    Activity Tolerance Treatment limited secondary to medical complications (Comment)    Behavior During Therapy WFL for tasks assessed/performed            Past Medical History:  Diagnosis Date   Arthritis    Colon polyps    Complication of anesthesia    woke up during 1 colonscopy 3 yrs ago   Diabetes mellitus without complication (HCC)    type 2   DM (diabetes mellitus) (HCC)    Family history of adverse reaction to anesthesia    mother slow to awaken after 1 procedure   GERD (gastroesophageal reflux disease)    Hypertension    hx of no bp meds for last 11 years after weight loss   Pancreatitis 3-4 yrs ago and feb 2018   Presence of artificial hip    has to take antibiotics prior to any procedure-dental, etc   Ulcer    Past Surgical History:  Procedure Laterality Date   CATARACT EXTRACTION W/PHACO Left 04/16/2023   Procedure: CATARACT EXTRACTION PHACO AND INTRAOCULAR LENS PLACEMENT (IOC) LEFT DIABETIC 7.53 00:42.8;  Surgeon: Enola Feliciano Hugger, MD;  Location: [facility];  Service: Ophthalmology;  Laterality: Left;   COLONOSCOPY WITH PROPOFOL  N/A 02/11/2019   Procedure: COLONOSCOPY WITH PROPOFOL ;  Surgeon: Gaylyn Gladis PENNER, MD;  Location: [facility];  Service: Endoscopy;  Laterality: N/A;   colonscopy     x 4   ESOPHAGOGASTRODUODENOSCOPY (EGD) WITH PROPOFOL  N/A 02/11/2019   Procedure: ESOPHAGOGASTRODUODENOSCOPY (EGD) WITH PROPOFOL ;   Surgeon: Gaylyn Gladis PENNER, MD;  Location: [facility];  Service: Endoscopy;  Laterality: N/A;   EUS N/A 01/01/2017   Procedure: UPPER ENDOSCOPIC ULTRASOUND (EUS) LINEAR;  Surgeon: Teressa Toribio SQUIBB, MD;  Location: [facility] ENDOSCOPY;  Service: Endoscopy;  Laterality: N/A;   FINE NEEDLE ASPIRATION  01/01/2017   Procedure: FINE NEEDLE ASPIRATION;  Surgeon: Teressa Toribio SQUIBB, MD;  Location: [facility] END[facility];  Service: Endoscopy;;   INCISION AND DRAINAGE ABSCESS N/A 12/29/2020   Procedure: INCISION AND DRAINAGE ABSCESS;  Surgeon: Rodolph Romano, MD;  Location: [facility] ORS;  Service: General;  Laterality: N/A;   JOINT REPLACEMENT     hip replacement   LEFT HEART CATH AND CORONARY ANGIOGRAPHY N/A 04/26/2019   Procedure: LEFT HEART CATH AND CORONARY ANGIOGRAPHY;  Surgeon: Ammon Blunt, MD;  Location: [facility];  Service: Cardiovascular;  Laterality: N/A;   STRABISMUS SURGERY  12/12/2011   Procedure: REPAIR STRABISMUS;  Surgeon: Elsie MALVA Salt, MD;  Location: [facility];  Service: Ophthalmology;  Laterality: Right;   TOTAL HIP ARTHROPLASTY     bilat   Patient Active Problem List   Diagnosis Date Noted   Severe sepsis (HCC) 06/05/2021   Abscess of left thigh 05/27/2021   Abscess 12/27/2020   Acute CHF (congestive heart failure) (HCC) 04/05/2019   Epigastric pain    Alcohol-induced acute pancreatitis  Abdominal pain 08/03/2016   Encounter for health maintenance examination 10/22/2015   Rheumatoid arthritis involving multiple sites with positive rheumatoid factor (HCC) 10/22/2015   BMI 35.0-35.9,adult 09/23/2015   Elevated rheumatoid factor 09/23/2015   History of adenomatous polyp of colon 09/23/2015   Alopecia 08/18/2015   Benign essential hypertension 08/18/2015   Benign neoplasm of colon 08/18/2015   Epigastric discomfort 08/18/2015   GERD without esophagitis 08/18/2015   Heartburn 08/18/2015   High risk medication use 08/18/2015   Hyperlipidemia 08/18/2015    Hyponatremia 08/18/2015   Infective otitis externa of left ear 08/18/2015   Insomnia 08/18/2015   Neuropathy of both feet 08/18/2015   Nicotine  dependence 08/18/2015   Oligomenorrhea 08/18/2015   Obesity 08/18/2015   Gastro-esophageal reflux disease with esophagitis 08/18/2015   Stress incontinence in female 08/18/2015   Type 2 diabetes, uncontrolled, with neuropathy 08/18/2015   Uncontrolled diabetes mellitus with complications 08/18/2015   Vitamin D  deficiency 08/18/2015   Wheezing 08/18/2015   Colon polyps 01/06/2014   Pancreatic mass 01/06/2014   Pancreatitis 01/06/2014    ONSET DATE: since hip replacement, L side re-done in 2023  REFERRING DIAG: R26.89 (ICD-10-CM) - Imbalance  THERAPY DIAG:  Difficulty in walking, not elsewhere classified  Muscle weakness (generalized)  Unsteadiness on feet  Other abnormalities of gait and mobility  History of falling  Rationale for Evaluation and Treatment: Rehabilitation  SUBJECTIVE:                                                                                                                                                                                             SUBJECTIVE STATEMENT:  Patient reports feeling better overall- not too much pain. Reports unable to check her BG today due to meter came off her arm in shower earlier today. States had a good Holiday.      Pt accompanied by: self  PERTINENT HISTORY:  52yoF referred to OPPT from neurology due to repeated falls, 1 related to LOC head injury, suspected role of hypoglycemia. PMH: poorly controlled IDDM2 (A1C>10), ongoing ETOH abuse, BLE polyneuropathy, bilat THA c chronic post-surgical Left hip pain, remote BG, lacunar CVA, depression, RA, ankle fracture (2022).   Pt reports increased imbalance since most recent THA. Requires 2 hand support shopping, SPC support for most AMB. Pt stating that she uses a cane occasionally, but keeps one with her (in car, around house,  etc.) just in case she may need it. Pt reporting that she has Good days & bad days related to imbalance, reporting that she feels more imbalanced when her L hip is painful.   PAIN:  Are you having  pain? Yes; left hip 4/10 anterior pocket distribution.   PRECAUTIONS: Fall, BG monitoring  WEIGHT BEARING RESTRICTIONS: No  FALLS: Has patient fallen in last 6 months? Yes. Number of falls 1, tripped over carpet about 2 weeks. Hit head, had some bruising/carpet burn.   LIVING ENVIRONMENT: Lives with: lives alone Lives in: House/apartment Stairs: Yes: Internal: ~12 steps; on left going up; pt reporting only guest bedroom upstairs, never using upstairs portion of house.  Has following equipment at home: Single point cane and shower chair  PLOF: Independent  PATIENT GOALS: feel more balanced   OBJECTIVE:  Note: Objective measures were completed at Evaluation unless otherwise noted.  DIAGNOSTIC FINDINGS:  09/23/2023 EMG LOWERS IMPRESSION: Abnormal study. There is electrodiagnostic evidence of a chronic, sensorimotor polyneuropathy in the lower extremities with a superimposed left peroneal mononeuropathy.   12/27/2020 CT HEAD WO CT SOFT TISSUE NECK W IMPRESSION:  1. New extensive cerebral white matter disease. This is nonspecific  but may reflect severely age advanced chronic small vessel ischemia  given the history of diabetes and presence of chronic basal ganglia  lacunar infarcts.  GAIT: Findings: Gait Characteristics: decreased speed, step through pattern, decreased stride length, and wide BOS, Distance walked: distance needed for gait into clinic & physical performance measures, Assistive device utilized:None, and Level of assistance: CGA  FUNCTIONAL TESTS:  5 times sit to stand: 26.89 10 meter walk test: avg normal: 0.49 m/s, avg fast: 0.61 m/s with no AD, CGA  PATIENT SURVEYS:  ABC scale: 55% (04/20/24)                                                                                                                              TREATMENT DATE: 05/23/24   Therapeutic Activities: dynamic therapeutic activities designed to achieve improved functional performance  The patient completed 5 minutes at level(s) 1 on the NuStep using both BUE/BLE reciprocal movements to promote strength, endurance, and cardiorespiratory fitness. PT increased the resistance level and monitored the patient's response to the intervention throughout. The patient required mod/max cueing for technique and SBA level of assistance.    Step up alt LE (4 step) x 12 reps with min UE Support STS from chair 2x10 hands free - VC for proper technique.    NMR:  Dynamic marching in // bars - down and back x 5 Side step without UE support in // bars - down and back - x 5 - forward/reverse gait 90ft x 5  with 3# AW each LE with minimal to no UE support- (unsteady- focusing on heel to toe gait)  - static stand on airex pad - feet apart x 45 sec with mild A/P sway -Static stand on airex pad - feet together x 30 sec x 2  -Static stand on airex pad- feet staggered x 45 sec x 2 each Side.   PATIENT EDUCATION: Education details: HEP, POC, goals including balance test Person educated: Patient Education method: Explanation, Demonstration, and Handouts  Education comprehension: verbalized understanding and needs further education  HOME EXERCISE PROGRAM: Access Code: [email or web address] URL: [email or web address] Date: 05/04/2024 Prepared by: Reyes London  Exercises - Standing Tandem Balance with Counter Support  - 3 x weekly - 3 sets - 30 sec hold - Standing Single Leg Stance with Counter Support  - 3 x weekly - 3 sets - 5-10 sec hold - Standing Romberg to 3/4 Tandem Stance  - 3 x weekly - 3 sets - 15-30 sec hold - Sit to Stand with Arms Crossed  - 3 x weekly - 3 sets - 10 reps  GOALS: Goals reviewed with patient? Yes  SHORT TERM GOALS: Target date: 06/01/2024  Patient will be independent  with home exercise program to improve strength/mobility for increased functional independence with ADLs and mobility. Baseline: HEP established at eval Goal status: INITIAL  LONG TERM GOALS: Target date: 07/13/2024  Patient will complete five times sit to stand test (5XSTS) in < 15 seconds indicating an increased LE strength and improved balance. Baseline: 26.89 seconds with BUE support at legs Goal status: INITIAL  2.  Patient will increase Berg Balance score by > 6 points to demonstrate decreased fall risk during functional activities. Baseline: 45 Goal status: INITIAL  3.  Patient will increase 10 meter walk test to >1.87m/s as to improve gait speed for better community ambulation and to reduce fall risk. Baseline: Average Normal speed: 0.49 m/s with CGA, no AD; Average Fast speed: 0.61 m/s with CGA, no AD Goal status: INITIAL  4.  Patient will improve ABC scale score >80% to demonstrate increased confidence with functional mobility and ADLs Baseline: 55% Goal status: INITIAL   ASSESSMENT:  CLINICAL IMPRESSION:   Patient was able to fully participate today and able to perform all activities without report of feeling bad or any report of hip pain. She was responsive to VC to perform all activities safely. She was challenged with balance activities and experienced most difficulty with narrowed standing on airex pad today.  Patient will benefit from skilled physical therapy intervention to reduce deficits and impairments identified in evaluation, in order to reduce pain, improve quality of life, and maximize activity tolerance for ADL, IADL, and leisure/fitness. Physical therapy will help pt achieve long and short term goals of care.     OBJECTIVE IMPAIRMENTS: Abnormal gait, decreased activity tolerance, decreased balance, decreased coordination, decreased endurance, decreased mobility, difficulty walking, decreased strength, and pain.   ACTIVITY LIMITATIONS: carrying, lifting,  bending, standing, stairs, and locomotion level  PARTICIPATION LIMITATIONS: cleaning, laundry, shopping, community activity, and occupation  PERSONAL FACTORS: Age, Fitness, Past/current experiences, Sex, and 3+ comorbidities: T2DM, prior history of heavy alcohol use, prior CVA, depression, RA are also affecting patient's functional outcome.   REHAB POTENTIAL: Good  CLINICAL DECISION MAKING: Evolving/moderate complexity  EVALUATION COMPLEXITY: Moderate  PLAN:  PT FREQUENCY: 1-2x/week  PT DURATION: 12 weeks  PLANNED INTERVENTIONS: [id number]- PT Re-evaluation, 97750- Physical Performance Testing, 97110-Therapeutic exercises, 97530- Therapeutic activity, [id number]- Neuromuscular re-education, 97535- Self Care, [id number]- Manual therapy, [id number]- Gait training, [phone]- Canalith repositioning, [id number] (1-2 muscles), [id number] (3+ muscles)- Dry Needling, Patient/Family education, Balance training, Stair training, Joint mobilization, Spinal mobilization, Vestibular training, Visual/preceptual remediation/compensation, Cryotherapy, and Moist heat  PLAN FOR NEXT SESSION:   Dynamic balance and gait training.   Continue to Monitor and educate on BG management.   Chyrl London, PT Physical Therapist - [facility]  3:16 PM 05/24/24

## 2024-05-23 ENCOUNTER — Ambulatory Visit: Attending: Neurology

## 2024-05-23 DIAGNOSIS — M6281 Muscle weakness (generalized): Secondary | ICD-10-CM | POA: Diagnosis present

## 2024-05-23 DIAGNOSIS — R2681 Unsteadiness on feet: Secondary | ICD-10-CM | POA: Diagnosis present

## 2024-05-23 DIAGNOSIS — Z9181 History of falling: Secondary | ICD-10-CM | POA: Diagnosis present

## 2024-05-23 DIAGNOSIS — R2689 Other abnormalities of gait and mobility: Secondary | ICD-10-CM | POA: Diagnosis present

## 2024-05-23 DIAGNOSIS — R262 Difficulty in walking, not elsewhere classified: Secondary | ICD-10-CM | POA: Diagnosis present

## 2024-05-24 ENCOUNTER — Ambulatory Visit: Admitting: Physical Therapy

## 2024-05-26 ENCOUNTER — Ambulatory Visit: Admitting: Physical Therapy

## 2024-05-31 ENCOUNTER — Ambulatory Visit: Admitting: Physical Therapy

## 2024-06-02 ENCOUNTER — Ambulatory Visit: Admitting: Physical Therapy

## 2024-06-02 ENCOUNTER — Ambulatory Visit

## 2024-06-02 DIAGNOSIS — R262 Difficulty in walking, not elsewhere classified: Secondary | ICD-10-CM

## 2024-06-02 DIAGNOSIS — M6281 Muscle weakness (generalized): Secondary | ICD-10-CM

## 2024-06-02 DIAGNOSIS — Z9181 History of falling: Secondary | ICD-10-CM

## 2024-06-02 DIAGNOSIS — R2689 Other abnormalities of gait and mobility: Secondary | ICD-10-CM

## 2024-06-02 DIAGNOSIS — R2681 Unsteadiness on feet: Secondary | ICD-10-CM

## 2024-06-02 NOTE — Therapy (Signed)
 OUTPATIENT PHYSICAL THERAPY TREATMENT  Patient Name: Ilia Dimaano MRN: [id number] DOB:[birth year], 53 y.o., female Today's Date: 06/02/2024  PCP: [name], PA REFERRING PROVIDER: Lane Arthea BRAVO, MD  END OF SESSION:  PT End of Session - 06/02/24 1214     Visit Number 7    Number of Visits 24    Date for Recertification  07/13/24    Progress Note Due on Visit 10    PT Start Time 1147    PT Stop Time 1232    PT Time Calculation (min) 45 min    Equipment Utilized During Treatment Gait belt    Activity Tolerance Treatment limited secondary to medical complications (Comment)    Behavior During Therapy WFL for tasks assessed/performed             Past Medical History:  Diagnosis Date   Arthritis    Colon polyps    Complication of anesthesia    woke up during 1 colonscopy 3 yrs ago   Diabetes mellitus without complication (HCC)    type 2   DM (diabetes mellitus) (HCC)    Family history of adverse reaction to anesthesia    mother slow to awaken after 1 procedure   GERD (gastroesophageal reflux disease)    Hypertension    hx of no bp meds for last 11 years after weight loss   Pancreatitis 3-4 yrs ago and feb 2018   Presence of artificial hip    has to take antibiotics prior to any procedure-dental, etc   Ulcer    Past Surgical History:  Procedure Laterality Date   CATARACT EXTRACTION W/PHACO Left 04/16/2023   Procedure: CATARACT EXTRACTION PHACO AND INTRAOCULAR LENS PLACEMENT (IOC) LEFT DIABETIC 7.53 00:42.8;  Surgeon: Enola Feliciano Hugger, MD;  Location: [facility];  Service: Ophthalmology;  Laterality: Left;   COLONOSCOPY WITH PROPOFOL  N/A 02/11/2019   Procedure: COLONOSCOPY WITH PROPOFOL ;  Surgeon: Gaylyn Gladis PENNER, MD;  Location: [facility];  Service: Endoscopy;  Laterality: N/A;   colonscopy     x 4   ESOPHAGOGASTRODUODENOSCOPY (EGD) WITH PROPOFOL  N/A 02/11/2019   Procedure: ESOPHAGOGASTRODUODENOSCOPY (EGD) WITH PROPOFOL ;   Surgeon: Gaylyn Gladis PENNER, MD;  Location: [facility] ENDOSCOPY;  Service: Endoscopy;  Laterality: N/A;   EUS N/A 01/01/2017   Procedure: UPPER ENDOSCOPIC ULTRASOUND (EUS) LINEAR;  Surgeon: Teressa Toribio SQUIBB, MD;  Location: [facility] END[facility];  Service: Endoscopy;  Laterality: N/A;   FINE NEEDLE ASPIRATION  01/01/2017   Procedure: FINE NEEDLE ASPIRATION;  Surgeon: Teressa Toribio SQUIBB, MD;  Location: [facility] END[facility];  Service: Endoscopy;;   INCISION AND DRAINAGE ABSCESS N/A 12/29/2020   Procedure: INCISION AND DRAINAGE ABSCESS;  Surgeon: Rodolph Romano, MD;  Location: [facility] ORS;  Service: General;  Laterality: N/A;   JOINT REPLACEMENT     hip replacement   LEFT HEART CATH AND CORONARY ANGIOGRAPHY N/A 04/26/2019   Procedure: LEFT HEART CATH AND CORONARY ANGIOGRAPHY;  Surgeon: Ammon Blunt, MD;  Location: [facility];  Service: Cardiovascular;  Laterality: N/A;   STRABISMUS SURGERY  12/12/2011   Procedure: REPAIR STRABISMUS;  Surgeon: Elsie MALVA Salt, MD;  Location: [facility];  Service: Ophthalmology;  Laterality: Right;   TOTAL HIP ARTHROPLASTY     bilat   Patient Active Problem List   Diagnosis Date Noted   Severe sepsis (HCC) 06/05/2021   Abscess of left thigh 05/27/2021   Abscess 12/27/2020   Acute CHF (congestive heart failure) (HCC) 04/05/2019   Epigastric pain    Alcohol-induced acute pancreatitis  Abdominal pain 08/03/2016   Encounter for health maintenance examination 10/22/2015   Rheumatoid arthritis involving multiple sites with positive rheumatoid factor (HCC) 10/22/2015   BMI 35.0-35.9,adult 09/23/2015   Elevated rheumatoid factor 09/23/2015   History of adenomatous polyp of colon 09/23/2015   Alopecia 08/18/2015   Benign essential hypertension 08/18/2015   Benign neoplasm of colon 08/18/2015   Epigastric discomfort 08/18/2015   GERD without esophagitis 08/18/2015   Heartburn 08/18/2015   High risk medication use 08/18/2015   Hyperlipidemia 08/18/2015    Hyponatremia 08/18/2015   Infective otitis externa of left ear 08/18/2015   Insomnia 08/18/2015   Neuropathy of both feet 08/18/2015   Nicotine  dependence 08/18/2015   Oligomenorrhea 08/18/2015   Obesity 08/18/2015   Gastro-esophageal reflux disease with esophagitis 08/18/2015   Stress incontinence in female 08/18/2015   Type 2 diabetes, uncontrolled, with neuropathy 08/18/2015   Uncontrolled diabetes mellitus with complications 08/18/2015   Vitamin D  deficiency 08/18/2015   Wheezing 08/18/2015   Colon polyps 01/06/2014   Pancreatic mass 01/06/2014   Pancreatitis 01/06/2014    ONSET DATE: since hip replacement, L side re-done in 2023  REFERRING DIAG: R26.89 (ICD-10-CM) - Imbalance  THERAPY DIAG:  Difficulty in walking, not elsewhere classified  Muscle weakness (generalized)  Unsteadiness on feet  Other abnormalities of gait and mobility  History of falling  Rationale for Evaluation and Treatment: Rehabilitation  SUBJECTIVE:                                                                                                                                                                                             SUBJECTIVE STATEMENT:  Patient reports Continuing to feel better overall- not too much pain 2/10. Reports happy with new meter/sensor as it goes off when by sugar is too high and too low.     Pt accompanied by: self  PERTINENT HISTORY:  52yoF referred to OPPT from neurology due to repeated falls, 1 related to LOC head injury, suspected role of hypoglycemia. PMH: poorly controlled IDDM2 (A1C>10), ongoing ETOH abuse, BLE polyneuropathy, bilat THA c chronic post-surgical Left hip pain, remote BG, lacunar CVA, depression, RA, ankle fracture (2022).   Pt reports increased imbalance since most recent THA. Requires 2 hand support shopping, SPC support for most AMB. Pt stating that she uses a cane occasionally, but keeps one with her (in car, around house, etc.) just  in case she may need it. Pt reporting that she has Good days & bad days related to imbalance, reporting that she feels more imbalanced when her L hip is painful.   PAIN:  Are you having pain? Yes; left  hip 4/10 anterior pocket distribution.   PRECAUTIONS: Fall, BG monitoring  WEIGHT BEARING RESTRICTIONS: No  FALLS: Has patient fallen in last 6 months? Yes. Number of falls 1, tripped over carpet about 2 weeks. Hit head, had some bruising/carpet burn.   LIVING ENVIRONMENT: Lives with: lives alone Lives in: House/apartment Stairs: Yes: Internal: ~12 steps; on left going up; pt reporting only guest bedroom upstairs, never using upstairs portion of house.  Has following equipment at home: Single point cane and shower chair  PLOF: Independent  PATIENT GOALS: feel more balanced   OBJECTIVE:  Note: Objective measures were completed at Evaluation unless otherwise noted.  DIAGNOSTIC FINDINGS:  09/23/2023 EMG LOWERS IMPRESSION: Abnormal study. There is electrodiagnostic evidence of a chronic, sensorimotor polyneuropathy in the lower extremities with a superimposed left peroneal mononeuropathy.   12/27/2020 CT HEAD WO CT SOFT TISSUE NECK W IMPRESSION:  1. New extensive cerebral white matter disease. This is nonspecific  but may reflect severely age advanced chronic small vessel ischemia  given the history of diabetes and presence of chronic basal ganglia  lacunar infarcts.  GAIT: Findings: Gait Characteristics: decreased speed, step through pattern, decreased stride length, and wide BOS, Distance walked: distance needed for gait into clinic & physical performance measures, Assistive device utilized:None, and Level of assistance: CGA  FUNCTIONAL TESTS:  5 times sit to stand: 26.89 10 meter walk test: avg normal: 0.49 m/s, avg fast: 0.61 m/s with no AD, CGA  PATIENT SURVEYS:  ABC scale: 55% (04/20/24)                                                                                                                              TREATMENT DATE: 06/01/24    BG= 119 Pain- 2/10 Left lateral hip   Therapeutic Activities:  The patient completed 6 minutes at level(s) 1-3 on the NuStep using both BUE/BLE reciprocal movements to promote strength, endurance, and cardiorespiratory fitness. PT increased the resistance level and monitored the patient's response to the intervention throughout. The patient required min cueing for technique and SBA level of assistance.      NMR:  Dynamic high knee march/walk in // bars - down and retro steps backward x 12  w/o UE support (VC for step height with march and VC for step length with retro)  Side step without UE support in // bars- resistive RTB - down and back - x 5 (no pain reported) - mild difficulty coordinating task - forward walking stepping over obstacles in // bars (pvc pipe x 2 and 2 1/2 foam roll) - Mild unsteadiness requiring intermittent UE support- x 10 down and back -Standing dynamic hip circles (swinging 1 LE around vertically positioned yoga block) 2 x 10 reps- attempting to only touch with UE as needed. Patient with difficulty swinging LLE  - but able to make revolutions with effort.     PATIENT EDUCATION: Education details: HEP, POC, goals including balance test Person educated: Patient Education  method: Explanation, Demonstration, and Handouts Education comprehension: verbalized understanding and needs further education  HOME EXERCISE PROGRAM: Access Code: [email or web address] URL: [email or web address] Date: 05/04/2024 Prepared by: Reyes London  Exercises - Standing Tandem Balance with Counter Support  - 3 x weekly - 3 sets - 30 sec hold - Standing Single Leg Stance with Counter Support  - 3 x weekly - 3 sets - 5-10 sec hold - Standing Romberg to 3/4 Tandem Stance  - 3 x weekly - 3 sets - 15-30 sec hold - Sit to Stand with Arms Crossed  - 3 x weekly - 3 sets - 10 reps  GOALS: Goals reviewed with  patient? Yes  SHORT TERM GOALS: Target date: 06/01/2024  Patient will be independent with home exercise program to improve strength/mobility for increased functional independence with ADLs and mobility. Baseline: HEP established at eval Goal status: INITIAL  LONG TERM GOALS: Target date: 07/13/2024  Patient will complete five times sit to stand test (5XSTS) in < 15 seconds indicating an increased LE strength and improved balance. Baseline: 26.89 seconds with BUE support at legs Goal status: INITIAL  2.  Patient will increase Berg Balance score by > 6 points to demonstrate decreased fall risk during functional activities. Baseline: 45 Goal status: INITIAL  3.  Patient will increase 10 meter walk test to >1.27m/s as to improve gait speed for better community ambulation and to reduce fall risk. Baseline: Average Normal speed: 0.49 m/s with CGA, no AD; Average Fast speed: 0.61 m/s with CGA, no AD Goal status: INITIAL  4.  Patient will improve ABC scale score >80% to demonstrate increased confidence with functional mobility and ADLs Baseline: 55% Goal status: INITIAL   ASSESSMENT:  CLINICAL IMPRESSION:   Patient was again able to fully participate in activities- limited only by loquacious nature. No pain reported except on last set of hip circles today with left hip. Treatment focused on functional movement incorportaing balance with some hip stability involvement. Overall she performed well and able to complete all activities with no pain and challenged with balance. Patient will benefit from skilled physical therapy intervention to reduce deficits and impairments identified in evaluation, in order to reduce pain, improve quality of life, and maximize activity tolerance for ADL, IADL, and leisure/fitness. Physical therapy will help pt achieve long and short term goals of care.     OBJECTIVE IMPAIRMENTS: Abnormal gait, decreased activity tolerance, decreased balance, decreased  coordination, decreased endurance, decreased mobility, difficulty walking, decreased strength, and pain.   ACTIVITY LIMITATIONS: carrying, lifting, bending, standing, stairs, and locomotion level  PARTICIPATION LIMITATIONS: cleaning, laundry, shopping, community activity, and occupation  PERSONAL FACTORS: Age, Fitness, Past/current experiences, Sex, and 3+ comorbidities: T2DM, prior history of heavy alcohol use, prior CVA, depression, RA are also affecting patient's functional outcome.   REHAB POTENTIAL: Good  CLINICAL DECISION MAKING: Evolving/moderate complexity  EVALUATION COMPLEXITY: Moderate  PLAN:  PT FREQUENCY: 1-2x/week  PT DURATION: 12 weeks  PLANNED INTERVENTIONS: [id number]- PT Re-evaluation, 97750- Physical Performance Testing, 97110-Therapeutic exercises, 97530- Therapeutic activity, [id number]- Neuromuscular re-education, 97535- Self Care, [id number]- Manual therapy, [id number]- Gait training, [phone]- Canalith repositioning, [phone] (1-2 muscles), [id number] (3+ muscles)- Dry Needling, Patient/Family education, Balance training, Stair training, Joint mobilization, Spinal mobilization, Vestibular training, Visual/preceptual remediation/compensation, Cryotherapy, and Moist heat  PLAN FOR NEXT SESSION:  Hip stability and strengthening as appropriate Dynamic balance and gait training.  Continue to Monitor and educate on BG management.   Chyrl London, PT Physical Therapist - [place]  [facility]  6:07 PM 06/02/2024

## 2024-06-07 ENCOUNTER — Ambulatory Visit: Admitting: Physical Therapy

## 2024-06-08 ENCOUNTER — Ambulatory Visit

## 2024-06-08 DIAGNOSIS — R262 Difficulty in walking, not elsewhere classified: Secondary | ICD-10-CM

## 2024-06-08 DIAGNOSIS — R2689 Other abnormalities of gait and mobility: Secondary | ICD-10-CM

## 2024-06-08 DIAGNOSIS — Z9181 History of falling: Secondary | ICD-10-CM

## 2024-06-08 DIAGNOSIS — M6281 Muscle weakness (generalized): Secondary | ICD-10-CM

## 2024-06-08 DIAGNOSIS — R2681 Unsteadiness on feet: Secondary | ICD-10-CM

## 2024-06-08 NOTE — Therapy (Signed)
 OUTPATIENT PHYSICAL THERAPY TREATMENT  Patient Name: April Chaney MRN: [id number] DOB:[birth year], 53 y.o., female Today's Date: 06/08/2024  PCP: [name], PA REFERRING PROVIDER: Lane Arthea BRAVO, MD  END OF SESSION:  PT End of Session - 06/08/24 1505     Visit Number 8    Number of Visits 24    Date for Recertification  07/13/24    Authorization Type [place] [employer]    Progress Note Due on Visit 10             Past Medical History:  Diagnosis Date   Arthritis    Colon polyps    Complication of anesthesia    woke up during 1 colonscopy 3 yrs ago   Diabetes mellitus without complication (HCC)    type 2   DM (diabetes mellitus) (HCC)    Family history of adverse reaction to anesthesia    mother slow to awaken after 1 procedure   GERD (gastroesophageal reflux disease)    Hypertension    hx of no bp meds for last 11 years after weight loss   Pancreatitis 3-4 yrs ago and feb 2018   Presence of artificial hip    has to take antibiotics prior to any procedure-dental, etc   Ulcer    Past Surgical History:  Procedure Laterality Date   CATARACT EXTRACTION W/PHACO Left 04/16/2023   Procedure: CATARACT EXTRACTION PHACO AND INTRAOCULAR LENS PLACEMENT (IOC) LEFT DIABETIC 7.53 00:42.8;  Surgeon: Enola Feliciano Hugger, MD;  Location: [facility];  Service: Ophthalmology;  Laterality: Left;   COLONOSCOPY WITH PROPOFOL  N/A 02/11/2019   Procedure: COLONOSCOPY WITH PROPOFOL ;  Surgeon: Gaylyn Gladis PENNER, MD;  Location: [facility];  Service: Endoscopy;  Laterality: N/A;   colonscopy     x 4   ESOPHAGOGASTRODUODENOSCOPY (EGD) WITH PROPOFOL  N/A 02/11/2019   Procedure: ESOPHAGOGASTRODUODENOSCOPY (EGD) WITH PROPOFOL ;  Surgeon: Gaylyn Gladis PENNER, MD;  Location: [facility] END[facility]COPY;  Service: Endoscopy;  Laterality: N/A;   EUS N/A 01/01/2017   Procedure: UPPER ENDOSCOPIC ULTRASOUND (EUS) LINEAR;  Surgeon: Teressa Toribio SQUIBB, MD;  Location: [facility] ENDOSCOPY;  Service:  Endoscopy;  Laterality: N/A;   FINE NEEDLE ASPIRATION  01/01/2017   Procedure: FINE NEEDLE ASPIRATION;  Surgeon: Teressa Toribio SQUIBB, MD;  Location: [facility] ENDOSCOPY;  Service: Endoscopy;;   INCISION AND DRAINAGE ABSCESS N/A 12/29/2020   Procedure: INCISION AND DRAINAGE ABSCESS;  Surgeon: Rodolph Romano, MD;  Location: [facility] ORS;  Service: General;  Laterality: N/A;   JOINT REPLACEMENT     hip replacement   LEFT HEART CATH AND CORONARY ANGIOGRAPHY N/A 04/26/2019   Procedure: LEFT HEART CATH AND CORONARY ANGIOGRAPHY;  Surgeon: Ammon Blunt, MD;  Location: [facility] INV[facility]IVE CV LAB;  Service: Cardiovascular;  Laterality: N/A;   STRABISMUS SURGERY  12/12/2011   Procedure: REPAIR STRABISMUS;  Surgeon: Elsie MALVA Salt, MD;  Location: [facility];  Service: Ophthalmology;  Laterality: Right;   TOTAL HIP ARTHROPLASTY     bilat   Patient Active Problem List   Diagnosis Date Noted   Severe sepsis (HCC) 06/05/2021   Abscess of left thigh 05/27/2021   Abscess 12/27/2020   Acute CHF (congestive heart failure) (HCC) 04/05/2019   Epigastric pain    Alcohol-induced acute pancreatitis    Abdominal pain 08/03/2016   Encounter for health maintenance examination 10/22/2015   Rheumatoid arthritis involving multiple sites with positive rheumatoid factor (HCC) 10/22/2015   BMI 35.0-35.9,adult 09/23/2015   Elevated rheumatoid factor 09/23/2015   History of adenomatous polyp of colon 09/23/2015  Alopecia 08/18/2015   Benign essential hypertension 08/18/2015   Benign neoplasm of colon 08/18/2015   Epigastric discomfort 08/18/2015   GERD without esophagitis 08/18/2015   Heartburn 08/18/2015   High risk medication use 08/18/2015   Hyperlipidemia 08/18/2015   Hyponatremia 08/18/2015   Infective otitis externa of left ear 08/18/2015   Insomnia 08/18/2015   Neuropathy of both feet 08/18/2015   Nicotine  dependence 08/18/2015   Oligomenorrhea 08/18/2015   Obesity 08/18/2015   Gastro-esophageal  reflux disease with esophagitis 08/18/2015   Stress incontinence in female 08/18/2015   Type 2 diabetes, uncontrolled, with neuropathy 08/18/2015   Uncontrolled diabetes mellitus with complications 08/18/2015   Vitamin D  deficiency 08/18/2015   Wheezing 08/18/2015   Colon polyps 01/06/2014   Pancreatic mass 01/06/2014   Pancreatitis 01/06/2014    ONSET DATE: since hip replacement, L side re-done in 2023  REFERRING DIAG: R26.89 (ICD-10-CM) - Imbalance  THERAPY DIAG:  Difficulty in walking, not elsewhere classified  Muscle weakness (generalized)  Unsteadiness on feet  Other abnormalities of gait and mobility  History of falling  Rationale for Evaluation and Treatment: Rehabilitation  SUBJECTIVE:                                                                                                                                                                                             SUBJECTIVE STATEMENT:  Pt doing well today. No updates since last session. No falls or close calls. No pain today. No medical updates.   PERTINENT HISTORY:  52yoF referred to OPPT from neurology due to repeated falls, 1 related to LOC head injury, suspected role of hypoglycemia. PMH: poorly controlled IDDM2 (A1C>10), ongoing ETOH abuse, BLE polyneuropathy, bilat THA c chronic post-surgical Left hip pain, remote BG, lacunar CVA, depression, RA, ankle fracture (2022).   Pt reports increased imbalance since most recent THA. Requires 2 hand support shopping, SPC support for most AMB. Pt stating that she uses a cane occasionally, but keeps one with her (in car, around house, etc.) just in case she may need it. Pt reporting that she has Good days & bad days related to imbalance, reporting that she feels more imbalanced when her L hip is painful.   PAIN:  Are you having pain? Yes; left hip 4/10 anterior pocket distribution.   PRECAUTIONS: Fall, BG monitoring  WEIGHT BEARING RESTRICTIONS: No  FALLS: Has  patient fallen in last 6 months? Yes. Number of falls 1, tripped over carpet about 2 weeks. Hit head, had some bruising/carpet burn.   LIVING ENVIRONMENT: Lives with: lives alone Lives in: House/apartment Stairs: Yes: Internal: ~12 steps; on left going up; pt  reporting only guest bedroom upstairs, never using upstairs portion of house.  Has following equipment at home: Single point cane and shower chair  PLOF: Independent  PATIENT GOALS: feel more balanced   OBJECTIVE:  Note: Objective measures were completed at Evaluation unless otherwise noted.  DIAGNOSTIC FINDINGS:  09/23/2023 EMG LOWERS IMPRESSION: Abnormal study. There is electrodiagnostic evidence of a chronic, sensorimotor polyneuropathy in the lower extremities with a superimposed left peroneal mononeuropathy.   12/27/2020 CT HEAD WO CT SOFT TISSUE NECK W IMPRESSION:  1. New extensive cerebral white matter disease. This is nonspecific  but may reflect severely age advanced chronic small vessel ischemia  given the history of diabetes and presence of chronic basal ganglia  lacunar infarcts.  GAIT: Findings: Gait Characteristics: decreased speed, step through pattern, decreased stride length, and wide BOS, Distance walked: distance needed for gait into clinic & physical performance measures, Assistive device utilized:None, and Level of assistance: CGA  FUNCTIONAL TESTS:  5 times sit to stand: 26.89 10 meter walk test: avg normal: 0.49 m/s, avg fast: 0.61 m/s with no AD, CGA  PATIENT SURVEYS:  ABC scale: 55% (04/20/24)                                                                                                                             TREATMENT DATE 06/08/2024 :   -Nustep seat 5, arms 6, 6 minutes at level 2 -STS from elevated surface hands free x10  -standing alternating toe taps of airex foam 1x30 -normal stance, firm surface: head turns calling out playing cards 10x bilat (car is 115 degrees in sagittal plane  bilat)  -in bars that are aligned in a // fashion: alternating forward high knee marching and backward straight knee AMB 5x each  -side stepping in // bars, hands free x4 bilat    06/02/24:  Dynamic high knee march/walk in // bars - down and retro steps backward x 12  w/o UE support (VC for step height with march and VC for step length with retro)  Side step without UE support in // bars- resistive RTB - down and back - x 5 (no pain reported) - mild difficulty coordinating task - forward walking stepping over obstacles in // bars (pvc pipe x 2 and 2 1/2 foam roll) - Mild unsteadiness requiring intermittent UE support- x 10 down and back -Standing dynamic hip circles (swinging 1 LE around vertically positioned yoga block) 2 x 10 reps- attempting to only touch with UE as needed. Patient with difficulty swinging LLE  - but able to make revolutions with effort.     PATIENT EDUCATION: Education details: HEP, POC, goals including balance test Person educated: Patient Education method: Explanation, Demonstration, and Handouts Education comprehension: verbalized understanding and needs further education  HOME EXERCISE PROGRAM: Access Code: [email or web address] URL: [email or web address] Date: 05/04/2024 Prepared by: Reyes London  Exercises - Standing Tandem Balance with Counter Support  - 3 x weekly - 3 sets - 30 sec  hold - Standing Single Leg Stance with Counter Support  - 3 x weekly - 3 sets - 5-10 sec hold - Standing Romberg to 3/4 Tandem Stance  - 3 x weekly - 3 sets - 15-30 sec hold - Sit to Stand with Arms Crossed  - 3 x weekly - 3 sets - 10 reps  GOALS: Goals reviewed with patient? Yes  SHORT TERM GOALS: Target date: 06/01/2024  Patient will be independent with home exercise program to improve strength/mobility for increased functional independence with ADLs and mobility. Baseline: HEP established at eval Goal status: INITIAL  LONG TERM GOALS: Target date:  07/13/2024  Patient will complete five times sit to stand test (5XSTS) in < 15 seconds indicating an increased LE strength and improved balance. Baseline: 26.89 seconds with BUE support at legs Goal status: INITIAL  2.  Patient will increase Berg Balance score by > 6 points to demonstrate decreased fall risk during functional activities. Baseline: 45 Goal status: INITIAL  3.  Patient will increase 10 meter walk test to >1.16m/s as to improve gait speed for better community ambulation and to reduce fall risk. Baseline: Average Normal speed: 0.49 m/s with CGA, no AD; Average Fast speed: 0.61 m/s with CGA, no AD Goal status: INITIAL  4.  Patient will improve ABC scale score >80% to demonstrate increased confidence with functional mobility and ADLs Baseline: 55% Goal status: INITIAL   ASSESSMENT:  CLINICAL IMPRESSION: Patient able to fully participate in activities- limited only by loquacious nature. Pain remains at goal. Treatment focused on functional movement incorportaing balance with some hip stability involvement. Overall she performed well and able to complete all activities with no pain and challenged with balance. Patient will benefit from skilled physical therapy intervention to reduce deficits and impairments identified in evaluation, in order to reduce pain, improve quality of life, and maximize activity tolerance for ADL, IADL, and leisure/fitness. Physical therapy will help pt achieve long and short term goals of care.     OBJECTIVE IMPAIRMENTS: Abnormal gait, decreased activity tolerance, decreased balance, decreased coordination, decreased endurance, decreased mobility, difficulty walking, decreased strength, and pain.   ACTIVITY LIMITATIONS: carrying, lifting, bending, standing, stairs, and locomotion level  PARTICIPATION LIMITATIONS: cleaning, laundry, shopping, community activity, and occupation  PERSONAL FACTORS: Age, Fitness, Past/current experiences, Sex, and 3+  comorbidities: T2DM, prior history of heavy alcohol use, prior CVA, depression, RA are also affecting patient's functional outcome.   REHAB POTENTIAL: Good  CLINICAL DECISION MAKING: Evolving/moderate complexity  EVALUATION COMPLEXITY: Moderate  PLAN:  PT FREQUENCY: 1-2x/week  PT DURATION: 12 weeks  PLANNED INTERVENTIONS: [id number]- PT Re-evaluation, 97750- Physical Performance Testing, 97110-Therapeutic exercises, 97530- Therapeutic activity, [id number]- Neuromuscular re-education, 97535- Self Care, [id number]- Manual therapy, [id number]- Gait training, [phone]- Canalith repositioning, [phone] (1-2 muscles), [id number] (3+ muscles)- Dry Needling, Patient/Family education, Balance training, Stair training, Joint mobilization, Spinal mobilization, Vestibular training, Visual/preceptual remediation/compensation, Cryotherapy, and Moist heat  PLAN FOR NEXT SESSION:  Hip stability and strengthening as appropriate Dynamic balance and gait training.  Continue to Monitor and educate on BG management.     3:07 PM, 06/08/2024 Peggye JAYSON Linear, PT, DPT Physical Therapist - [facility]  Outpatient Physical Therapy- Main Campus [phone]

## 2024-06-09 ENCOUNTER — Ambulatory Visit: Admitting: Physical Therapy

## 2024-06-13 ENCOUNTER — Ambulatory Visit

## 2024-06-14 ENCOUNTER — Ambulatory Visit

## 2024-06-14 ENCOUNTER — Ambulatory Visit: Admitting: Physical Therapy

## 2024-06-21 ENCOUNTER — Ambulatory Visit: Admitting: Physical Therapy

## 2024-06-22 ENCOUNTER — Ambulatory Visit

## 2024-06-28 ENCOUNTER — Ambulatory Visit: Attending: Neurology

## 2024-06-28 DIAGNOSIS — R2689 Other abnormalities of gait and mobility: Secondary | ICD-10-CM | POA: Insufficient documentation

## 2024-06-28 DIAGNOSIS — Z9181 History of falling: Secondary | ICD-10-CM | POA: Insufficient documentation

## 2024-06-28 DIAGNOSIS — M6281 Muscle weakness (generalized): Secondary | ICD-10-CM | POA: Diagnosis present

## 2024-06-28 DIAGNOSIS — R262 Difficulty in walking, not elsewhere classified: Secondary | ICD-10-CM | POA: Diagnosis present

## 2024-06-28 DIAGNOSIS — R2681 Unsteadiness on feet: Secondary | ICD-10-CM | POA: Insufficient documentation

## 2024-06-28 NOTE — Therapy (Signed)
 " OUTPATIENT PHYSICAL THERAPY TREATMENT  Patient Name: April Chaney MRN: [id number] DOB:10-19-1970, 54 y.o., female Today's Date: 06/28/2024  PCP: [name], PA REFERRING PROVIDER: Lane Arthea BRAVO, MD  END OF SESSION:  PT End of Session - 06/28/24 1408     Visit Number 9    Number of Visits 24    Date for Recertification  07/13/24    Authorization Type [employer] [employer]    Progress Note Due on Visit 10    PT Start Time 1404    PT Stop Time 1444    PT Time Calculation (min) 40 min              Past Medical History:  Diagnosis Date   Arthritis    Colon polyps    Complication of anesthesia    woke up during 1 colonscopy 3 yrs ago   Diabetes mellitus without complication (HCC)    type 2   DM (diabetes mellitus) (HCC)    Family history of adverse reaction to anesthesia    mother slow to awaken after 1 procedure   GERD (gastroesophageal reflux disease)    Hypertension    hx of no bp meds for last 11 years after weight loss   Pancreatitis 3-4 yrs ago and feb 2018   Presence of artificial hip    has to take antibiotics prior to any procedure-dental, etc   Ulcer    Past Surgical History:  Procedure Laterality Date   CATARACT EXTRACTION W/PHACO Left 04/16/2023   Procedure: CATARACT EXTRACTION PHACO AND INTRAOCULAR LENS PLACEMENT (IOC) LEFT DIABETIC 7.53 00:42.8;  Surgeon: Enola Feliciano Hugger, MD;  Location: [facility];  Service: Ophthalmology;  Laterality: Left;   COLONOSCOPY WITH PROPOFOL  N/A 02/11/2019   Procedure: COLONOSCOPY WITH PROPOFOL ;  Surgeon: Gaylyn Gladis PENNER, MD;  Location: [facility];  Service: Endoscopy;  Laterality: N/A;   colonscopy     x 4   ESOPHAGOGASTRODUODENOSCOPY (EGD) WITH PROPOFOL  N/A 02/11/2019   Procedure: ESOPHAGOGASTRODUODENOSCOPY (EGD) WITH PROPOFOL ;  Surgeon: Gaylyn Gladis PENNER, MD;  Location: [facility] ENDOSCOPY;  Service: Endoscopy;  Laterality: N/A;   EUS N/A 01/01/2017   Procedure: UPPER ENDOSCOPIC ULTRASOUND (EUS)  LINEAR;  Surgeon: Teressa Toribio SQUIBB, MD;  Location: [facility];  Service: Endoscopy;  Laterality: N/A;   FINE NEEDLE ASPIRATION  01/01/2017   Procedure: FINE NEEDLE ASPIRATION;  Surgeon: Teressa Toribio SQUIBB, MD;  Location: [facility];  Service: Endoscopy;;   INCISION AND DRAINAGE ABSCESS N/A 12/29/2020   Procedure: INCISION AND DRAINAGE ABSCESS;  Surgeon: Rodolph Romano, MD;  Location: [facility] ORS;  Service: General;  Laterality: N/A;   JOINT REPLACEMENT     hip replacement   LEFT HEART CATH AND CORONARY ANGIOGRAPHY N/A 04/26/2019   Procedure: LEFT HEART CATH AND CORONARY ANGIOGRAPHY;  Surgeon: Ammon Blunt, MD;  Location: [facility];  Service: Cardiovascular;  Laterality: N/A;   STRABISMUS SURGERY  12/12/2011   Procedure: REPAIR STRABISMUS;  Surgeon: Elsie MALVA Salt, MD;  Location: [facility];  Service: Ophthalmology;  Laterality: Right;   TOTAL HIP ARTHROPLASTY     bilat   Patient Active Problem List   Diagnosis Date Noted   Severe sepsis (HCC) 06/05/2021   Abscess of left thigh 05/27/2021   Abscess 12/27/2020   Acute CHF (congestive heart failure) (HCC) 04/05/2019   Epigastric pain    Alcohol-induced acute pancreatitis    Abdominal pain 08/03/2016   Encounter for health maintenance examination 10/22/2015   Rheumatoid arthritis involving multiple sites with positive  rheumatoid factor (HCC) 10/22/2015   BMI 35.0-35.9,adult 09/23/2015   Elevated rheumatoid factor 09/23/2015   History of adenomatous polyp of colon 09/23/2015   Alopecia 08/18/2015   Benign essential hypertension 08/18/2015   Benign neoplasm of colon 08/18/2015   Epigastric discomfort 08/18/2015   GERD without esophagitis 08/18/2015   Heartburn 08/18/2015   High risk medication use 08/18/2015   Hyperlipidemia 08/18/2015   Hyponatremia 08/18/2015   Infective otitis externa of left ear 08/18/2015   Insomnia 08/18/2015   Neuropathy of both feet 08/18/2015   Nicotine  dependence  08/18/2015   Oligomenorrhea 08/18/2015   Obesity 08/18/2015   Gastro-esophageal reflux disease with esophagitis 08/18/2015   Stress incontinence in female 08/18/2015   Type 2 diabetes, uncontrolled, with neuropathy 08/18/2015   Uncontrolled diabetes mellitus with complications 08/18/2015   Vitamin D  deficiency 08/18/2015   Wheezing 08/18/2015   Colon polyps 01/06/2014   Pancreatic mass 01/06/2014   Pancreatitis 01/06/2014    ONSET DATE: since hip replacement, L side re-done in 2023  REFERRING DIAG: R26.89 (ICD-10-CM) - Imbalance  THERAPY DIAG:  Difficulty in walking, not elsewhere classified  Muscle weakness (generalized)  Unsteadiness on feet  Other abnormalities of gait and mobility  History of falling  Rationale for Evaluation and Treatment: Rehabilitation  SUBJECTIVE:                                                                                                                                                                                             SUBJECTIVE STATEMENT:  Pt doing well today with no pain reported upon arrival. Reports no recent falls.    PERTINENT HISTORY:  52yoF referred to OPPT from neurology due to repeated falls, 1 related to LOC head injury, suspected role of hypoglycemia. PMH: poorly controlled IDDM2 (A1C>10), ongoing ETOH abuse, BLE polyneuropathy, bilat THA c chronic post-surgical Left hip pain, remote BG, lacunar CVA, depression, RA, ankle fracture (2022).   Pt reports increased imbalance since most recent THA. Requires 2 hand support shopping, SPC support for most AMB. Pt stating that she uses a cane occasionally, but keeps one with her (in car, around house, etc.) just in case she may need it. Pt reporting that she has Good days & bad days related to imbalance, reporting that she feels more imbalanced when her L hip is painful.   PAIN:  Are you having pain? Yes; left hip 4/10 anterior pocket distribution.   PRECAUTIONS: Fall, BG  monitoring  WEIGHT BEARING RESTRICTIONS: No  FALLS: Has patient fallen in last 6 months? Yes. Number of falls 1, tripped over carpet about 2 weeks. Hit head, had some bruising/carpet burn.  LIVING ENVIRONMENT: Lives with: lives alone Lives in: House/apartment Stairs: Yes: Internal: ~12 steps; on left going up; pt reporting only guest bedroom upstairs, never using upstairs portion of house.  Has following equipment at home: Single point cane and shower chair  PLOF: Independent  PATIENT GOALS: feel more balanced   OBJECTIVE:  Note: Objective measures were completed at Evaluation unless otherwise noted.  DIAGNOSTIC FINDINGS:  09/23/2023 EMG LOWERS IMPRESSION: Abnormal study. There is electrodiagnostic evidence of a chronic, sensorimotor polyneuropathy in the lower extremities with a superimposed left peroneal mononeuropathy.   12/27/2020 CT HEAD WO CT SOFT TISSUE NECK W IMPRESSION:  1. New extensive cerebral white matter disease. This is nonspecific  but may reflect severely age advanced chronic small vessel ischemia  given the history of diabetes and presence of chronic basal ganglia  lacunar infarcts.  GAIT: Findings: Gait Characteristics: decreased speed, step through pattern, decreased stride length, and wide BOS, Distance walked: distance needed for gait into clinic & physical performance measures, Assistive device utilized:None, and Level of assistance: CGA  FUNCTIONAL TESTS:  5 times sit to stand: 26.89 10 meter walk test: avg normal: 0.49 m/s, avg fast: 0.61 m/s with no AD, CGA  PATIENT SURVEYS:  ABC scale: 55% (04/20/24)                                                                                                                             TREATMENT DATE 06/28/2024 :   -Nustep seat 5, arms 6, 6 minutes at level 2  -STS from elevated surface hands free 2x10   -Alternating toe taps onto 6'' step at balance station without UE support 2x10 each.  -Marching on  airex x30 at balance station.  -Step-ups on 6'' step x10 each.  -Tandem walk in // bars on Airex beam x3 laps.  -Side-stepping on airex beam in // bars x 3 laps.   -Side-stepping in // bars with yellow TB around ankles x2 laps.  -Tandem stance in // bars 2x30'' each.    06/02/24:  Dynamic high knee march/walk in // bars - down and retro steps backward x 12  w/o UE support (VC for step height with march and VC for step length with retro)  Side step without UE support in // bars- resistive RTB - down and back - x 5 (no pain reported) - mild difficulty coordinating task - forward walking stepping over obstacles in // bars (pvc pipe x 2 and 2 1/2 foam roll) - Mild unsteadiness requiring intermittent UE support- x 10 down and back -Standing dynamic hip circles (swinging 1 LE around vertically positioned yoga block) 2 x 10 reps- attempting to only touch with UE as needed. Patient with difficulty swinging LLE  - but able to make revolutions with effort.     PATIENT EDUCATION: Education details: HEP, POC, goals including balance test Person educated: Patient Education method: Explanation, Demonstration, and Handouts Education comprehension: verbalized understanding and needs further education  HOME  EXERCISE PROGRAM: Access Code: [email or web address] URL: [email or web address] Date: 05/04/2024 Prepared by: Reyes London  Exercises - Standing Tandem Balance with Counter Support  - 3 x weekly - 3 sets - 30 sec hold - Standing Single Leg Stance with Counter Support  - 3 x weekly - 3 sets - 5-10 sec hold - Standing Romberg to 3/4 Tandem Stance  - 3 x weekly - 3 sets - 15-30 sec hold - Sit to Stand with Arms Crossed  - 3 x weekly - 3 sets - 10 reps  GOALS: Goals reviewed with patient? Yes  SHORT TERM GOALS: Target date: 06/01/2024  Patient will be independent with home exercise program to improve strength/mobility for increased functional independence with ADLs and  mobility. Baseline: HEP established at eval Goal status: INITIAL  LONG TERM GOALS: Target date: 07/13/2024  Patient will complete five times sit to stand test (5XSTS) in < 15 seconds indicating an increased LE strength and improved balance. Baseline: 26.89 seconds with BUE support at legs Goal status: INITIAL  2.  Patient will increase Berg Balance score by > 6 points to demonstrate decreased fall risk during functional activities. Baseline: 45 Goal status: INITIAL  3.  Patient will increase 10 meter walk test to >1.76m/s as to improve gait speed for better community ambulation and to reduce fall risk. Baseline: Average Normal speed: 0.49 m/s with CGA, no AD; Average Fast speed: 0.61 m/s with CGA, no AD Goal status: INITIAL  4.  Patient will improve ABC scale score >80% to demonstrate increased confidence with functional mobility and ADLs Baseline: 55% Goal status: INITIAL   ASSESSMENT:  CLINICAL IMPRESSION: Patient warmed up on Nustep today followed with dynamic and static balance exercises. She continues to have poor confidence in balance today throughout treatment requiring multiple cues to improve confidence and let go of bars when attempting balance activities. Step-ups and sit to stands performed to improve strength as well as patient's ability to perform these tasks at home. She continues to show weakness in lower extremities with resistance exercises. Patient required CGA throughout balance activities today for safety. Patient is a good candidate for continuing skilled PT treatments at this time for further improvement toward goals. Progress note to be taken at next visit.  OBJECTIVE IMPAIRMENTS: Abnormal gait, decreased activity tolerance, decreased balance, decreased coordination, decreased endurance, decreased mobility, difficulty walking, decreased strength, and pain.   ACTIVITY LIMITATIONS: carrying, lifting, bending, standing, stairs, and locomotion level  PARTICIPATION  LIMITATIONS: cleaning, laundry, shopping, community activity, and occupation  PERSONAL FACTORS: Age, Fitness, Past/current experiences, Sex, and 3+ comorbidities: T2DM, prior history of heavy alcohol use, prior CVA, depression, RA are also affecting patient's functional outcome.   REHAB POTENTIAL: Good  CLINICAL DECISION MAKING: Evolving/moderate complexity  EVALUATION COMPLEXITY: Moderate  PLAN:  PT FREQUENCY: 1-2x/week  PT DURATION: 12 weeks  PLANNED INTERVENTIONS: [id number]- PT Re-evaluation, 97750- Physical Performance Testing, 97110-Therapeutic exercises, 97530- Therapeutic activity, [id number]- Neuromuscular re-education, 97535- Self Care, [id number]- Manual therapy, [id number]- Gait training, [phone]- Canalith repositioning, [phone] (1-2 muscles), [id number] (3+ muscles)- Dry Needling, Patient/Family education, Balance training, Stair training, Joint mobilization, Spinal mobilization, Vestibular training, Visual/preceptual remediation/compensation, Cryotherapy, and Moist heat  PLAN FOR NEXT SESSION:  Hip stability and strengthening as appropriate Dynamic balance and gait training.      2:45 PM, 06/28/2024 Norman Sharps, PT, DPT Physical Therapist - [place]  [facility]       "

## 2024-07-06 ENCOUNTER — Ambulatory Visit: Admitting: Physical Therapy

## 2024-07-06 DIAGNOSIS — R2689 Other abnormalities of gait and mobility: Secondary | ICD-10-CM

## 2024-07-06 DIAGNOSIS — R262 Difficulty in walking, not elsewhere classified: Secondary | ICD-10-CM | POA: Diagnosis not present

## 2024-07-06 DIAGNOSIS — M6281 Muscle weakness (generalized): Secondary | ICD-10-CM

## 2024-07-06 DIAGNOSIS — R2681 Unsteadiness on feet: Secondary | ICD-10-CM

## 2024-07-06 NOTE — Therapy (Addendum)
 " OUTPATIENT PHYSICAL THERAPY TREATMENT/ Physical Therapy Progress Note   Dates of reporting period  04/20/24   to   07/06/2024    Patient Name: April Chaney MRN: [id number] DOB:07-08-1970, 54 y.o., female Today's Date: 07/06/2024  PCP: [name], PA REFERRING PROVIDER: Lane Arthea BRAVO, MD  END OF SESSION:  PT End of Session - 07/06/24 1615     Visit Number 10    Number of Visits 24    Date for Recertification  07/13/24    Authorization Type [place] [employer]    Progress Note Due on Visit 10    PT Start Time 1403    PT Stop Time 1445    PT Time Calculation (min) 42 min    Equipment Utilized During Treatment Gait belt    Activity Tolerance Patient tolerated treatment well    Behavior During Therapy WFL for tasks assessed/performed               Past Medical History:  Diagnosis Date   Arthritis    Colon polyps    Complication of anesthesia    woke up during 1 colonscopy 3 yrs ago   Diabetes mellitus without complication (HCC)    type 2   DM (diabetes mellitus) (HCC)    Family history of adverse reaction to anesthesia    mother slow to awaken after 1 procedure   GERD (gastroesophageal reflux disease)    Hypertension    hx of no bp meds for last 11 years after weight loss   Pancreatitis 3-4 yrs ago and feb 2018   Presence of artificial hip    has to take antibiotics prior to any procedure-dental, etc   Ulcer    Past Surgical History:  Procedure Laterality Date   CATARACT EXTRACTION W/PHACO Left 04/16/2023   Procedure: CATARACT EXTRACTION PHACO AND INTRAOCULAR LENS PLACEMENT (IOC) LEFT DIABETIC 7.53 00:42.8;  Surgeon: Enola Feliciano Hugger, MD;  Location: [facility];  Service: Ophthalmology;  Laterality: Left;   COLONOSCOPY WITH PROPOFOL  N/A 02/11/2019   Procedure: COLONOSCOPY WITH PROPOFOL ;  Surgeon: Gaylyn Gladis PENNER, MD;  Location: [facility];  Service: Endoscopy;  Laterality: N/A;   colonscopy     x 4   ESOPHAGOGASTRODUODENOSCOPY  (EGD) WITH PROPOFOL  N/A 02/11/2019   Procedure: ESOPHAGOGASTRODUODENOSCOPY (EGD) WITH PROPOFOL ;  Surgeon: Gaylyn Gladis PENNER, MD;  Location: [facility] END[facility]COPY;  Service: Endoscopy;  Laterality: N/A;   EUS N/A 01/01/2017   Procedure: UPPER ENDOSCOPIC ULTRASOUND (EUS) LINEAR;  Surgeon: Teressa Toribio SQUIBB, MD;  Location: [facility];  Service: Endoscopy;  Laterality: N/A;   FINE NEEDLE ASPIRATION  01/01/2017   Procedure: FINE NEEDLE ASPIRATION;  Surgeon: Teressa Toribio SQUIBB, MD;  Location: [facility];  Service: Endoscopy;;   INCISION AND DRAINAGE ABSCESS N/A 12/29/2020   Procedure: INCISION AND DRAINAGE ABSCESS;  Surgeon: Rodolph Romano, MD;  Location: [facility];  Service: General;  Laterality: N/A;   JOINT REPLACEMENT     hip replacement   LEFT HEART CATH AND CORONARY ANGIOGRAPHY N/A 04/26/2019   Procedure: LEFT HEART CATH AND CORONARY ANGIOGRAPHY;  Surgeon: Ammon Blunt, MD;  Location: [facility];  Service: Cardiovascular;  Laterality: N/A;   STRABISMUS SURGERY  12/12/2011   Procedure: REPAIR STRABISMUS;  Surgeon: Elsie MALVA Salt, MD;  Location:  SURGERY CENTER;  Service: Ophthalmology;  Laterality: Right;   TOTAL HIP ARTHROPLASTY     bilat   Patient Active Problem List   Diagnosis Date Noted   Severe sepsis (HCC) 06/05/2021   Abscess of  left thigh 05/27/2021   Abscess 12/27/2020   Acute CHF (congestive heart failure) (HCC) 04/05/2019   Epigastric pain    Alcohol-induced acute pancreatitis    Abdominal pain 08/03/2016   Encounter for health maintenance examination 10/22/2015   Rheumatoid arthritis involving multiple sites with positive rheumatoid factor (HCC) 10/22/2015   BMI 35.0-35.9,adult 09/23/2015   Elevated rheumatoid factor 09/23/2015   History of adenomatous polyp of colon 09/23/2015   Alopecia 08/18/2015   Benign essential hypertension 08/18/2015   Benign neoplasm of colon 08/18/2015   Epigastric discomfort 08/18/2015   GERD without esophagitis  08/18/2015   Heartburn 08/18/2015   High risk medication use 08/18/2015   Hyperlipidemia 08/18/2015   Hyponatremia 08/18/2015   Infective otitis externa of left ear 08/18/2015   Insomnia 08/18/2015   Neuropathy of both feet 08/18/2015   Nicotine  dependence 08/18/2015   Oligomenorrhea 08/18/2015   Obesity 08/18/2015   Gastro-esophageal reflux disease with esophagitis 08/18/2015   Stress incontinence in female 08/18/2015   Type 2 diabetes, uncontrolled, with neuropathy 08/18/2015   Uncontrolled diabetes mellitus with complications 08/18/2015   Vitamin D  deficiency 08/18/2015   Wheezing 08/18/2015   Colon polyps 01/06/2014   Pancreatic mass 01/06/2014   Pancreatitis 01/06/2014    ONSET DATE: since hip replacement, L side re-done in 2023  REFERRING DIAG: R26.89 (ICD-10-CM) - Imbalance  THERAPY DIAG:  Difficulty in walking, not elsewhere classified  Muscle weakness (generalized)  Unsteadiness on feet  Other abnormalities of gait and mobility  Rationale for Evaluation and Treatment: Rehabilitation  SUBJECTIVE:                                                                                                                                                                                             SUBJECTIVE STATEMENT:  Pt doing well today with no pain reported upon arrival. Reports no recent falls.  Upon sitting down treatment session patient reveals her blood sugar monitor has hypoglycemia of 62 mg/dL.  Physical therapist proceeded to address this by having patient have 15 g of carbohydrates that were in a sealed container and fresh.  Upon hypoglycemia reversing continue with remainder of session  PERTINENT HISTORY:  52yoF referred to OPPT from neurology due to repeated falls, 1 related to LOC head injury, suspected role of hypoglycemia. PMH: poorly controlled IDDM2 (A1C>10), ongoing ETOH abuse, BLE polyneuropathy, bilat THA c chronic post-surgical Left hip pain, remote BG,  lacunar CVA, depression, RA, ankle fracture (2022).   Pt reports increased imbalance since most recent THA. Requires 2 hand support shopping, SPC support for most AMB. Pt stating that she uses a cane occasionally, but keeps  one with her (in car, around house, etc.) just in case she may need it. Pt reporting that she has Good days & bad days related to imbalance, reporting that she feels more imbalanced when her L hip is painful.   PAIN:  Are you having pain? Yes; left hip 4/10 anterior pocket distribution.   PRECAUTIONS: Fall, BG monitoring  WEIGHT BEARING RESTRICTIONS: No  FALLS: Has patient fallen in last 6 months? Yes. Number of falls 1, tripped over carpet about 2 weeks. Hit head, had some bruising/carpet burn.   LIVING ENVIRONMENT: Lives with: lives alone Lives in: House/apartment Stairs: Yes: Internal: ~12 steps; on left going up; pt reporting only guest bedroom upstairs, never using upstairs portion of house.  Has following equipment at home: Single point cane and shower chair  PLOF: Independent  PATIENT GOALS: feel more balanced   OBJECTIVE:  Note: Objective measures were completed at Evaluation unless otherwise noted.  DIAGNOSTIC FINDINGS:  09/23/2023 EMG LOWERS IMPRESSION: Abnormal study. There is electrodiagnostic evidence of a chronic, sensorimotor polyneuropathy in the lower extremities with a superimposed left peroneal mononeuropathy.   12/27/2020 CT HEAD WO CT SOFT TISSUE NECK W IMPRESSION:  1. New extensive cerebral white matter disease.  Including taking 15 g of quick acting carbohydrate and ensuring she does not take too many grams to prevent spiking and hyperglycemia.  Instructed patient importance of maintaining proper blood glucose values for safety with exercise.  Is nonspecific  but may reflect severely age advanced chronic small vessel ischemia  given the history of diabetes and presence of chronic basal ganglia  lacunar infarcts.  GAIT: Findings:  Gait Characteristics: decreased speed, step through pattern, decreased stride length, and wide BOS, Distance walked: distance needed for gait into clinic & physical performance measures, Assistive device utilized:None, and Level of assistance: CGA  FUNCTIONAL TESTS:  5 times sit to stand: 26.89 10 meter walk test: avg normal: 0.49 m/s, avg fast: 0.61 m/s with no AD, CGA  PATIENT SURVEYS:  ABC scale: 55% (04/20/24)                                                                                                                             TREATMENT DATE 07/06/2024 :   Self care Address patient's hypoglycemia instructed her in correct ways to  10 Meter Walk Test: Patient instructed to walk 10 meters (32.8 ft) as quickly and as safely as possible at their normal speed Results: .68 m/s normal speed .8 m/s fast speed   Cut off scores:   Household Ambulator  < 0.4 m/s  Limited Community Ambulator  0.4 - 0.8 m/s  [employer]  > 0.8 m/s  Increased fall risk  < 1.52m/s  Crossing a Street  >1.35m/s  MCID 0.05 m/s (small), 0.13 m/s (moderate), 0.06 m/s (significant)  (ANPTA Core Set of Outcome Measures for Adults with Neurologic Conditions, 2018)      Five times Sit to Stand Test (FTSS)  TIME:  18.82 sec  Cut off scores indicative of increased fall risk: >12 sec CVA, >16 sec PD, >13 sec vestibular (ANPTA Core Set of Outcome Measures for Adults with Neurologic Conditions, 2018)  Patient demonstrates increased fall risk as noted by score of  48 /56 on Berg Balance Scale.  (<36= high risk for falls, close to 100%; 37-45 significant >80%; 46-51 moderate >50%; 52-55 lower >25%)   [facility] PT Assessment - 07/06/24 0001       Berg Balance Test   Sit to Stand Able to stand without using hands and stabilize independently    Standing Unsupported Able to stand safely 2 minutes    Sitting with Back Unsupported but Feet Supported on Floor or Stool Able to sit safely and securely 2 minutes     Stand to Sit Sits safely with minimal use of hands    Transfers Able to transfer safely, minor use of hands    Standing Unsupported with Eyes Closed Able to stand 10 seconds safely    Standing Unsupported with Feet Together Able to place feet together independently and stand 1 minute safely    From Standing, Reach Forward with Outstretched Arm Can reach forward >12 cm safely (5)    From Standing Position, Pick up Object from Floor Able to pick up shoe, needs supervision    From Standing Position, Turn to Look Behind Over each Shoulder Looks behind from both sides and weight shifts well    Turn 360 Degrees Able to turn 360 degrees safely but slowly    Standing Unsupported, Alternately Place Feet on Step/Stool Able to stand independently and safely and complete 8 steps in 20 seconds    Standing Unsupported, One Foot in Front Able to plae foot ahead of the other independently and hold 30 seconds    Standing on One Leg Tries to lift leg/unable to hold 3 seconds but remains standing independently    Total Score 48         ABC scale: The Activities-Specific Balance Confidence (ABC) Scale   No confidence<->completely confident     How confident are you that you will not lose your balance or become unsteady when you . . .       Date tested 07/06/24   1: Walk around the house 50   2. Walk up or down stairs 20   3. Bend over and pick up a slipper from in front of a closet floor 70   4. Reach for a small can off a shelf at eye level 70   5. Stand on tip toes and reach for something above your head 70   6. Stand on a chair and reach for something 70   7. Sweep the floor 70   8. Walk outside the house to a car parked in the driveway 70   9. Get into or out of a car 80   10. Walk across a parking lot to the mall 70   11. Walk up or down a ramp 70   12. Walk in a crowded mall where people rapidly walk past you 50   13. Are bumped into by people as you walk through the mall 50   14. Step onto or  off of an escalator while you are holding onto the railing 70   15. Step onto or off an escalator while holding onto parcels such that you cannot hold onto the railing 70   16. Walk outside on icy sidewalks 60   Total: #/  16 1048 65.5    PATIENT EDUCATION: Education details: HEP, POC, goals including balance test Person educated: Patient Education method: Explanation, Demonstration, and Handouts Education comprehension: verbalized understanding and needs further education  HOME EXERCISE PROGRAM: Access Code: [email or web address] URL: [email or web address] Date: 05/04/2024 Prepared by: Reyes London  Exercises - Standing Tandem Balance with Counter Support  - 3 x weekly - 3 sets - 30 sec hold - Standing Single Leg Stance with Counter Support  - 3 x weekly - 3 sets - 5-10 sec hold - Standing Romberg to 3/4 Tandem Stance  - 3 x weekly - 3 sets - 15-30 sec hold - Sit to Stand with Arms Crossed  - 3 x weekly - 3 sets - 10 reps  GOALS: Goals reviewed with patient? Yes  SHORT TERM GOALS: Target date: 06/01/2024  Patient will be independent with home exercise program to improve strength/mobility for increased functional independence with ADLs and mobility. Baseline: HEP established at eval 1/14: doing regularly  Goal status: MET   LONG TERM GOALS: Target date: 07/13/2024  Patient will complete five times sit to stand test (5XSTS) in < 15 seconds indicating an increased LE strength and improved balance. Baseline: 26.89 seconds with BUE support at legs 07/07/23:18.82 sec no UE needed! Goal status: INITIAL  2.  Patient will increase Berg Balance score by > 6 points to demonstrate decreased fall risk during functional activities. Baseline: 45 1/14: 48 Goal status: ONGOING  3.  Patient will increase 10 meter walk test to >1.60m/s as to improve gait speed for better community ambulation and to reduce fall risk. Baseline: Average Normal speed: 0.49 m/s with CGA, no AD; Average Fast  speed: 0.61 m/s with CGA, no AD 07/07/23: .68 m/s normal speed .8 m/s fast speed  Goal status: ONGOING  4.  Patient will improve ABC scale score >80% to demonstrate increased confidence with functional mobility and ADLs Baseline: 55% 1/14:65.5% Goal status: INITIAL   ASSESSMENT:  CLINICAL IMPRESSION: mprovements in BERG, , and 5xSTS were demonstrated today. These improvements are clinically meaningful because increases in BERG score reflect better balance and reduced risk of falls, faster 10 meter walk test times indicate enhanced gait speed and community mobility, and improved five times sit to stand performance demonstrates greater lower extremity strength and functional transfer ability. Together these gains support safer independent mobility and improved participation in daily activities. Patient arrived with good motivation for completion of PT activities. Upon arrival, the patient initially presented with low blood sugar values and required treatment with simple carbohydrates to reach safe levels for participation in exercise and for safely leaving the clinic. Following stabilization of blood glucose, the patient completed progress note activities and demonstrated measurable progress toward all long term goals. Although the patient has shown meaningful improvement, further gains are still needed to optimize functional performance. The patient will continue to benefit from skilled physical therapy intervention to address remaining impairments, improve quality of life, and support achievement of therapy goals.   OBJECTIVE IMPAIRMENTS: Abnormal gait, decreased activity tolerance, decreased balance, decreased coordination, decreased endurance, decreased mobility, difficulty walking, decreased strength, and pain.   ACTIVITY LIMITATIONS: carrying, lifting, bending, standing, stairs, and locomotion level  PARTICIPATION LIMITATIONS: cleaning, laundry, shopping, community activity, and  occupation  PERSONAL FACTORS: Age, Fitness, Past/current experiences, Sex, and 3+ comorbidities: T2DM, prior history of heavy alcohol use, prior CVA, depression, RA are also affecting patient's functional outcome.   REHAB POTENTIAL: Good  CLINICAL DECISION MAKING: Evolving/moderate complexity  EVALUATION COMPLEXITY: Moderate  PLAN:  PT FREQUENCY: 1-2x/week  PT DURATION: 12 weeks  PLANNED INTERVENTIONS: [id number]- PT Re-evaluation, 97750- Physical Performance Testing, 97110-Therapeutic exercises, 97530- Therapeutic activity, [id number]- Neuromuscular re-education, 97535- Self Care, [id number]- Manual therapy, [id number]- Gait training, [phone]- Canalith repositioning, [phone] (1-2 muscles), [id number] (3+ muscles)- Dry Needling, Patient/Family education, Balance training, Stair training, Joint mobilization, Spinal mobilization, Vestibular training, Visual/preceptual remediation/compensation, Cryotherapy, and Moist heat  PLAN FOR NEXT SESSION:  Hip stability and strengthening as appropriate Dynamic balance and gait training.      4:16 PM, 07/06/2024 Note: Portions of this document were prepared using Dragon voice recognition software and although reviewed may contain unintentional dictation errors in syntax, grammar, or spelling.  Lonni KATHEE Gainer PT ,DPT Physical Therapist- [place]  [facility]         "

## 2024-07-13 ENCOUNTER — Ambulatory Visit: Admitting: Physical Therapy

## 2024-07-13 DIAGNOSIS — R2689 Other abnormalities of gait and mobility: Secondary | ICD-10-CM

## 2024-07-13 DIAGNOSIS — R262 Difficulty in walking, not elsewhere classified: Secondary | ICD-10-CM | POA: Diagnosis not present

## 2024-07-13 DIAGNOSIS — R2681 Unsteadiness on feet: Secondary | ICD-10-CM

## 2024-07-13 DIAGNOSIS — M6281 Muscle weakness (generalized): Secondary | ICD-10-CM

## 2024-07-13 DIAGNOSIS — Z9181 History of falling: Secondary | ICD-10-CM

## 2024-07-13 NOTE — Therapy (Addendum)
 " OUTPATIENT PHYSICAL THERAPY TREATMENT/ RE-CERTIFICATION NOTE       Patient Name: April Chaney MRN: [id number] DOB:08/16/1970, 54 y.o., female Today's Date: 07/14/2024  PCP: [name], PA REFERRING PROVIDER: Lane Arthea BRAVO, MD  END OF SESSION:  PT End of Session - 07/13/24 1544     Visit Number 11    Number of Visits 24    Date for Recertification  09/08/24    Authorization Type [employer]    Progress Note Due on Visit 20    PT Start Time 1533    PT Stop Time 1613    PT Time Calculation (min) 40 min    Equipment Utilized During Treatment Gait belt    Activity Tolerance Patient tolerated treatment well    Behavior During Therapy WFL for tasks assessed/performed                Past Medical History:  Diagnosis Date   Arthritis    Colon polyps    Complication of anesthesia    woke up during 1 colonscopy 3 yrs ago   Diabetes mellitus without complication (HCC)    type 2   DM (diabetes mellitus) (HCC)    Family history of adverse reaction to anesthesia    mother slow to awaken after 1 procedure   GERD (gastroesophageal reflux disease)    Hypertension    hx of no bp meds for last 11 years after weight loss   Pancreatitis 3-4 yrs ago and feb 2018   Presence of artificial hip    has to take antibiotics prior to any procedure-dental, etc   Ulcer    Past Surgical History:  Procedure Laterality Date   CATARACT EXTRACTION W/PHACO Left 04/16/2023   Procedure: CATARACT EXTRACTION PHACO AND INTRAOCULAR LENS PLACEMENT (IOC) LEFT DIABETIC 7.53 00:42.8;  Surgeon: Enola Feliciano Hugger, MD;  Location: [facility];  Service: Ophthalmology;  Laterality: Left;   COLONOSCOPY WITH PROPOFOL  N/A 02/11/2019   Procedure: COLONOSCOPY WITH PROPOFOL ;  Surgeon: Gaylyn Gladis PENNER, MD;  Location: [facility];  Service: Endoscopy;  Laterality: N/A;   colonscopy     x 4   ESOPHAGOGASTRODUODENOSCOPY (EGD) WITH PROPOFOL  N/A 02/11/2019   Procedure:  ESOPHAGOGASTRODUODENOSCOPY (EGD) WITH PROPOFOL ;  Surgeon: Gaylyn Gladis PENNER, MD;  Location: [facility] END[facility]COPY;  Service: Endoscopy;  Laterality: N/A;   EUS N/A 01/01/2017   Procedure: UPPER ENDOSCOPIC ULTRASOUND (EUS) LINEAR;  Surgeon: Teressa Toribio SQUIBB, MD;  Location: [facility];  Service: Endoscopy;  Laterality: N/A;   FINE NEEDLE ASPIRATION  01/01/2017   Procedure: FINE NEEDLE ASPIRATION;  Surgeon: Teressa Toribio SQUIBB, MD;  Location: [facility];  Service: Endoscopy;;   INCISION AND DRAINAGE ABSCESS N/A 12/29/2020   Procedure: INCISION AND DRAINAGE ABSCESS;  Surgeon: Rodolph Romano, MD;  Location: [facility] ORS;  Service: General;  Laterality: N/A;   JOINT REPLACEMENT     hip replacement   LEFT HEART CATH AND CORONARY ANGIOGRAPHY N/A 04/26/2019   Procedure: LEFT HEART CATH AND CORONARY ANGIOGRAPHY;  Surgeon: Ammon Blunt, MD;  Location: [facility];  Service: Cardiovascular;  Laterality: N/A;   STRABISMUS SURGERY  12/12/2011   Procedure: REPAIR STRABISMUS;  Surgeon: Elsie MALVA Salt, MD;  Location: [facility];  Service: Ophthalmology;  Laterality: Right;   TOTAL HIP ARTHROPLASTY     bilat   Patient Active Problem List   Diagnosis Date Noted   Severe sepsis (HCC) 06/05/2021   Abscess of left thigh 05/27/2021   Abscess 12/27/2020   Acute CHF (congestive  heart failure) (HCC) 04/05/2019   Epigastric pain    Alcohol-induced acute pancreatitis    Abdominal pain 08/03/2016   Encounter for health maintenance examination 10/22/2015   Rheumatoid arthritis involving multiple sites with positive rheumatoid factor (HCC) 10/22/2015   BMI 35.0-35.9,adult 09/23/2015   Elevated rheumatoid factor 09/23/2015   History of adenomatous polyp of colon 09/23/2015   Alopecia 08/18/2015   Benign essential hypertension 08/18/2015   Benign neoplasm of colon 08/18/2015   Epigastric discomfort 08/18/2015   GERD without esophagitis 08/18/2015   Heartburn 08/18/2015   High risk  medication use 08/18/2015   Hyperlipidemia 08/18/2015   Hyponatremia 08/18/2015   Infective otitis externa of left ear 08/18/2015   Insomnia 08/18/2015   Neuropathy of both feet 08/18/2015   Nicotine  dependence 08/18/2015   Oligomenorrhea 08/18/2015   Obesity 08/18/2015   Gastro-esophageal reflux disease with esophagitis 08/18/2015   Stress incontinence in female 08/18/2015   Type 2 diabetes, uncontrolled, with neuropathy 08/18/2015   Uncontrolled diabetes mellitus with complications 08/18/2015   Vitamin D  deficiency 08/18/2015   Wheezing 08/18/2015   Colon polyps 01/06/2014   Pancreatic mass 01/06/2014   Pancreatitis 01/06/2014    ONSET DATE: since hip replacement, L side re-done in 2023  REFERRING DIAG: R26.89 (ICD-10-CM) - Imbalance  THERAPY DIAG:  Difficulty in walking, not elsewhere classified  Muscle weakness (generalized)  Unsteadiness on feet  Other abnormalities of gait and mobility  History of falling  Rationale for Evaluation and Treatment: Rehabilitation  SUBJECTIVE:                                                                                                                                                                                             SUBJECTIVE STATEMENT:   Pt reports doing well today with no signifcant changes of note since last visit.  Patient feels she is improving but still has some room to go.  Still doing some issues with balance and some discomfort in her hip at times.  PERTINENT HISTORY:  52yoF referred to OPPT from neurology due to repeated falls, 1 related to LOC head injury, suspected role of hypoglycemia. PMH: poorly controlled IDDM2 (A1C>10), ongoing ETOH abuse, BLE polyneuropathy, bilat THA c chronic post-surgical Left hip pain, remote BG, lacunar CVA, depression, RA, ankle fracture (2022).   Pt reports increased imbalance since most recent THA. Requires 2 hand support shopping, SPC support for most AMB. Pt stating that she  uses a cane occasionally, but keeps one with her (in car, around house, etc.) just in case she may need it. Pt reporting that she has Good days & bad days related to imbalance,  reporting that she feels more imbalanced when her L hip is painful.   PAIN:  Are you having pain? Yes; left hip 4/10 anterior pocket distribution.   PRECAUTIONS: Fall, BG monitoring  WEIGHT BEARING RESTRICTIONS: No  FALLS: Has patient fallen in last 6 months? Yes. Number of falls 1, tripped over carpet about 2 weeks. Hit head, had some bruising/carpet burn.   LIVING ENVIRONMENT: Lives with: lives alone Lives in: House/apartment Stairs: Yes: Internal: ~12 steps; on left going up; pt reporting only guest bedroom upstairs, never using upstairs portion of house.  Has following equipment at home: Single point cane and shower chair  PLOF: Independent  PATIENT GOALS: feel more balanced   OBJECTIVE:  Note: Objective measures were completed at Evaluation unless otherwise noted.  DIAGNOSTIC FINDINGS:  09/23/2023 EMG LOWERS IMPRESSION: Abnormal study. There is electrodiagnostic evidence of a chronic, sensorimotor polyneuropathy in the lower extremities with a superimposed left peroneal mononeuropathy.   12/27/2020 CT HEAD WO CT SOFT TISSUE NECK W IMPRESSION:  1. New extensive cerebral white matter disease.  Including taking 15 g of quick acting carbohydrate and ensuring she does not take too many grams to prevent spiking and hyperglycemia.  Instructed patient importance of maintaining proper blood glucose values for safety with exercise.  Is nonspecific  but may reflect severely age advanced chronic small vessel ischemia  given the history of diabetes and presence of chronic basal ganglia  lacunar infarcts.  GAIT: Findings: Gait Characteristics: decreased speed, step through pattern, decreased stride length, and wide BOS, Distance walked: distance needed for gait into clinic & physical performance measures,  Assistive device utilized:None, and Level of assistance: CGA  FUNCTIONAL TESTS:  5 times sit to stand: 26.89 10 meter walk test: avg normal: 0.49 m/s, avg fast: 0.61 m/s with no AD, CGA  PATIENT SURVEYS:  ABC scale: 55% (04/20/24)                                                                                                                             TREATMENT DATE 07/14/24 :     TA- To improve functional movements patterns for everyday tasks  And NMR: To facilitate reeducation of movement, balance, posture, coordination, and/or proprioception/kinesthetic sense.  Nustep level 4-9 x 8 min   Balance course in // bars - 1/2 foam, airex, 1/2 foam step over  X 4 sidestepping - cues for no UE  X 4 laps forwards  -*1 lap = 1 time each way to return to start point  Sidestepping with resistance cable x 4 laps with 7.5# and UE support in // bars for closed chain hip strength   Sit to stand 2 x 10 with 4KG ball   Unless otherwise stated, CGA was provided and gait belt donned in order to ensure pt safety   PATIENT EDUCATION: Education details: HEP, POC, goals including balance test Person educated: Patient Education method: Explanation, Demonstration, and Handouts Education comprehension: verbalized understanding and needs further education  HOME EXERCISE PROGRAM: Access Code: [email or web address] URL: [email or web address] Date: 05/04/2024 Prepared by: Reyes London  Exercises - Standing Tandem Balance with Counter Support  - 3 x weekly - 3 sets - 30 sec hold - Standing Single Leg Stance with Counter Support  - 3 x weekly - 3 sets - 5-10 sec hold - Standing Romberg to 3/4 Tandem Stance  - 3 x weekly - 3 sets - 15-30 sec hold - Sit to Stand with Arms Crossed  - 3 x weekly - 3 sets - 10 reps  GOALS: Goals reviewed with patient? Yes  SHORT TERM GOALS: Target date: 06/01/2024  Patient will be independent with home exercise program to improve strength/mobility for  increased functional independence with ADLs and mobility. Baseline: HEP established at eval 1/14: doing regularly  Goal status: MET   LONG TERM GOALS: Target date: 09/08/2024  Patient will complete five times sit to stand test (5XSTS) in < 15 seconds indicating an increased LE strength and improved balance. Baseline: 26.89 seconds with BUE support at legs 07/07/23:18.82 sec no UE needed! Goal status: INITIAL  2.  Patient will increase Berg Balance score by > 6 points to demonstrate decreased fall risk during functional activities. Baseline: 45 1/14: 48 Goal status: ONGOING  3.  Patient will increase 10 meter walk test to >1.33m/s as to improve gait speed for better community ambulation and to reduce fall risk. Baseline: Average Normal speed: 0.49 m/s with CGA, no AD; Average Fast speed: 0.61 m/s with CGA, no AD 07/07/23: .68 m/s normal speed .8 m/s fast speed  Goal status: ONGOING  4.  Patient will improve ABC scale score >80% to demonstrate increased confidence with functional mobility and ADLs Baseline: 55% 1/14:65.5% Goal status: ONGOING   ASSESSMENT:  CLINICAL IMPRESSION: The patient is a 54 year old female status post total hip arthroplasty who continues to report frequent falls and presents with ongoing balance and lower extremity strength deficits. During todays session she completed cardiovascular warm up on the Nustep along with progressive balance challenges on foam surfaces, lateral resisted walking for closed chain hip strengthening, and sit to stand work with an external load. She demonstrated the need for cues to reduce upper extremity support and intermittent instability on compliant surfaces, indicating continued difficulty with proprioception and hip control.  Ongoing physical therapy remains beneficial to improve her balance strategies, strengthen the hip musculature that contributes to fall prevention, and progress her functional mobility with greater safety. Continued  structured intervention will support improved gait stability, reduce fall risk, and enhance her confidence in daily activities following her total hip arthroplasty.  Goals were assessed at last visit per progress notes and no goals assessed at this recertification.  Please see above goal section for updated goals and goal progress since beginning physical therapy.   OBJECTIVE IMPAIRMENTS: Abnormal gait, decreased activity tolerance, decreased balance, decreased coordination, decreased endurance, decreased mobility, difficulty walking, decreased strength, and pain.   ACTIVITY LIMITATIONS: carrying, lifting, bending, standing, stairs, and locomotion level  PARTICIPATION LIMITATIONS: cleaning, laundry, shopping, community activity, and occupation  PERSONAL FACTORS: Age, Fitness, Past/current experiences, Sex, and 3+ comorbidities: T2DM, prior history of heavy alcohol use, prior CVA, depression, RA are also affecting patient's functional outcome.   REHAB POTENTIAL: Good  CLINICAL DECISION MAKING: Evolving/moderate complexity  EVALUATION COMPLEXITY: Moderate  PLAN:  PT FREQUENCY: 1-2x/week  PT DURATION: 12 weeks  PLANNED INTERVENTIONS: 97164- PT Re-evaluation, 97750- Physical Performance Testing, 97110-Therapeutic exercises, 97530- Therapeutic activity, [id number]- Neuromuscular re-education, 97535- Self Care, [id number]- Manual  therapy, [id number]- Gait training, 04007- Canalith repositioning, [id number] (1-2 muscles), 20561 (3+ muscles)- Dry Needling, Patient/Family education, Balance training, Stair training, Joint mobilization, Spinal mobilization, Vestibular training, Visual/preceptual remediation/compensation, Cryotherapy, and Moist heat  PLAN FOR NEXT SESSION:  Hip stability and strengthening as appropriate Dynamic balance and gait training.      7:04 AM, 07/14/24 Note: Portions of this document were prepared using Dragon voice recognition software and although reviewed may contain unintentional  dictation errors in syntax, grammar, or spelling.  Lonni KATHEE Gainer PT ,DPT Physical Therapist- [place]  [facility]         "

## 2024-07-19 ENCOUNTER — Ambulatory Visit: Admitting: Physical Therapy

## 2024-07-19 DIAGNOSIS — R2681 Unsteadiness on feet: Secondary | ICD-10-CM

## 2024-07-19 DIAGNOSIS — M6281 Muscle weakness (generalized): Secondary | ICD-10-CM

## 2024-07-19 DIAGNOSIS — R262 Difficulty in walking, not elsewhere classified: Secondary | ICD-10-CM

## 2024-07-19 DIAGNOSIS — Z9181 History of falling: Secondary | ICD-10-CM

## 2024-07-19 DIAGNOSIS — R2689 Other abnormalities of gait and mobility: Secondary | ICD-10-CM

## 2024-07-26 ENCOUNTER — Ambulatory Visit: Attending: Neurology | Admitting: Physical Therapy

## 2024-07-26 DIAGNOSIS — Z9181 History of falling: Secondary | ICD-10-CM

## 2024-07-26 DIAGNOSIS — R262 Difficulty in walking, not elsewhere classified: Secondary | ICD-10-CM

## 2024-07-26 DIAGNOSIS — R2681 Unsteadiness on feet: Secondary | ICD-10-CM

## 2024-07-26 DIAGNOSIS — M6281 Muscle weakness (generalized): Secondary | ICD-10-CM

## 2024-07-26 DIAGNOSIS — R2689 Other abnormalities of gait and mobility: Secondary | ICD-10-CM

## 2024-08-02 ENCOUNTER — Ambulatory Visit: Admitting: Physical Therapy

## 2024-08-09 ENCOUNTER — Ambulatory Visit: Admitting: Physical Therapy

## 2024-08-16 ENCOUNTER — Ambulatory Visit: Admitting: Physical Therapy

## 2024-08-23 ENCOUNTER — Ambulatory Visit: Attending: Neurology | Admitting: Physical Therapy

## 2024-08-30 ENCOUNTER — Ambulatory Visit: Admitting: Physical Therapy

## 2024-09-06 ENCOUNTER — Ambulatory Visit: Admitting: Physical Therapy

## 2024-09-13 ENCOUNTER — Ambulatory Visit: Admitting: Physical Therapy

## 2024-09-20 ENCOUNTER — Ambulatory Visit: Admitting: Physical Therapy

## 2024-09-27 ENCOUNTER — Ambulatory Visit: Admitting: Physical Therapy

## 2024-10-04 ENCOUNTER — Ambulatory Visit: Admitting: Physical Therapy

## 2024-10-11 ENCOUNTER — Ambulatory Visit: Admitting: Physical Therapy
